# Patient Record
Sex: Male | Born: 1935 | Race: Black or African American | Hispanic: No | Marital: Married | Smoking: Former smoker
Health system: Southern US, Community
[De-identification: ages and names within clinical notes are randomized; demographics above are authoritative.]

## PROBLEM LIST (undated history)

## (undated) DIAGNOSIS — E039 Hypothyroidism, unspecified: Secondary | ICD-10-CM

## (undated) DIAGNOSIS — C801 Malignant (primary) neoplasm, unspecified: Secondary | ICD-10-CM

## (undated) DIAGNOSIS — K219 Gastro-esophageal reflux disease without esophagitis: Secondary | ICD-10-CM

## (undated) DIAGNOSIS — K317 Polyp of stomach and duodenum: Secondary | ICD-10-CM

## (undated) DIAGNOSIS — E785 Hyperlipidemia, unspecified: Secondary | ICD-10-CM

## (undated) DIAGNOSIS — I10 Essential (primary) hypertension: Secondary | ICD-10-CM

## (undated) DIAGNOSIS — K31819 Angiodysplasia of stomach and duodenum without bleeding: Secondary | ICD-10-CM

## (undated) DIAGNOSIS — E079 Disorder of thyroid, unspecified: Secondary | ICD-10-CM

## (undated) DIAGNOSIS — S31000A Unspecified open wound of lower back and pelvis without penetration into retroperitoneum, initial encounter: Secondary | ICD-10-CM

## (undated) DIAGNOSIS — I5022 Chronic systolic (congestive) heart failure: Secondary | ICD-10-CM

## (undated) DIAGNOSIS — E11319 Type 2 diabetes mellitus with unspecified diabetic retinopathy without macular edema: Secondary | ICD-10-CM

## (undated) DIAGNOSIS — D649 Anemia, unspecified: Secondary | ICD-10-CM

## (undated) DIAGNOSIS — M1990 Unspecified osteoarthritis, unspecified site: Secondary | ICD-10-CM

## (undated) DIAGNOSIS — C61 Malignant neoplasm of prostate: Secondary | ICD-10-CM

## (undated) DIAGNOSIS — I214 Non-ST elevation (NSTEMI) myocardial infarction: Secondary | ICD-10-CM

## (undated) DIAGNOSIS — I739 Peripheral vascular disease, unspecified: Secondary | ICD-10-CM

## (undated) DIAGNOSIS — E1140 Type 2 diabetes mellitus with diabetic neuropathy, unspecified: Secondary | ICD-10-CM

## (undated) DIAGNOSIS — J9600 Acute respiratory failure, unspecified whether with hypoxia or hypercapnia: Secondary | ICD-10-CM

## (undated) DIAGNOSIS — R3915 Urgency of urination: Secondary | ICD-10-CM

## (undated) HISTORY — PX: EYE SURGERY: SHX253

## (undated) HISTORY — PX: CATARACT EXTRACTION, BILATERAL: SHX1313

## (undated) HISTORY — PX: FOOT AMPUTATION: SHX951

## (undated) HISTORY — PX: TONSILLECTOMY: SUR1361

## (undated) HISTORY — PX: COLONOSCOPY W/ POLYPECTOMY: SHX1380

---

## 1998-11-17 ENCOUNTER — Encounter: Payer: Self-pay | Admitting: Emergency Medicine

## 1998-11-17 ENCOUNTER — Inpatient Hospital Stay (HOSPITAL_COMMUNITY): Admission: EM | Admit: 1998-11-17 | Discharge: 1998-11-19 | Payer: Self-pay | Admitting: Emergency Medicine

## 1998-11-24 ENCOUNTER — Encounter: Admission: RE | Admit: 1998-11-24 | Discharge: 1998-11-24 | Payer: Self-pay | Admitting: Family Medicine

## 1998-12-09 ENCOUNTER — Encounter: Payer: Self-pay | Admitting: Internal Medicine

## 1998-12-09 ENCOUNTER — Ambulatory Visit (HOSPITAL_COMMUNITY): Admission: RE | Admit: 1998-12-09 | Discharge: 1998-12-09 | Payer: Self-pay | Admitting: Internal Medicine

## 1999-11-12 ENCOUNTER — Encounter: Payer: Self-pay | Admitting: Emergency Medicine

## 1999-11-12 ENCOUNTER — Emergency Department (HOSPITAL_COMMUNITY): Admission: EM | Admit: 1999-11-12 | Discharge: 1999-11-12 | Payer: Self-pay | Admitting: Emergency Medicine

## 2001-06-10 ENCOUNTER — Emergency Department (HOSPITAL_COMMUNITY): Admission: EM | Admit: 2001-06-10 | Discharge: 2001-06-10 | Payer: Self-pay

## 2001-09-24 ENCOUNTER — Encounter: Payer: Self-pay | Admitting: Emergency Medicine

## 2001-09-24 ENCOUNTER — Emergency Department (HOSPITAL_COMMUNITY): Admission: EM | Admit: 2001-09-24 | Discharge: 2001-09-24 | Payer: Self-pay | Admitting: Emergency Medicine

## 2002-03-17 ENCOUNTER — Emergency Department (HOSPITAL_COMMUNITY): Admission: EM | Admit: 2002-03-17 | Discharge: 2002-03-17 | Payer: Self-pay

## 2002-10-27 ENCOUNTER — Encounter: Payer: Self-pay | Admitting: Emergency Medicine

## 2002-10-27 ENCOUNTER — Emergency Department (HOSPITAL_COMMUNITY): Admission: EM | Admit: 2002-10-27 | Discharge: 2002-10-27 | Payer: Self-pay | Admitting: Emergency Medicine

## 2003-05-09 ENCOUNTER — Emergency Department (HOSPITAL_COMMUNITY): Admission: EM | Admit: 2003-05-09 | Discharge: 2003-05-09 | Payer: Self-pay | Admitting: Emergency Medicine

## 2003-06-06 ENCOUNTER — Encounter: Payer: Self-pay | Admitting: Emergency Medicine

## 2003-06-06 ENCOUNTER — Emergency Department (HOSPITAL_COMMUNITY): Admission: EM | Admit: 2003-06-06 | Discharge: 2003-06-06 | Payer: Self-pay | Admitting: Emergency Medicine

## 2004-04-21 ENCOUNTER — Encounter: Admission: RE | Admit: 2004-04-21 | Discharge: 2004-04-21 | Payer: Self-pay | Admitting: Urology

## 2005-11-21 ENCOUNTER — Emergency Department (HOSPITAL_COMMUNITY): Admission: EM | Admit: 2005-11-21 | Discharge: 2005-11-21 | Payer: Self-pay | Admitting: Emergency Medicine

## 2005-11-30 ENCOUNTER — Emergency Department (HOSPITAL_COMMUNITY): Admission: EM | Admit: 2005-11-30 | Discharge: 2005-11-30 | Payer: Self-pay | Admitting: Emergency Medicine

## 2007-01-08 ENCOUNTER — Emergency Department (HOSPITAL_COMMUNITY): Admission: EM | Admit: 2007-01-08 | Discharge: 2007-01-08 | Payer: Self-pay | Admitting: Emergency Medicine

## 2008-07-11 ENCOUNTER — Emergency Department (HOSPITAL_COMMUNITY): Admission: EM | Admit: 2008-07-11 | Discharge: 2008-07-11 | Payer: Self-pay | Admitting: Emergency Medicine

## 2008-07-17 ENCOUNTER — Encounter (INDEPENDENT_AMBULATORY_CARE_PROVIDER_SITE_OTHER): Payer: Self-pay | Admitting: Endocrinology

## 2008-07-17 ENCOUNTER — Inpatient Hospital Stay (HOSPITAL_COMMUNITY): Admission: AD | Admit: 2008-07-17 | Discharge: 2008-07-22 | Payer: Self-pay | Admitting: Endocrinology

## 2008-07-19 ENCOUNTER — Ambulatory Visit: Payer: Self-pay | Admitting: Gastroenterology

## 2008-07-20 ENCOUNTER — Encounter: Payer: Self-pay | Admitting: Gastroenterology

## 2008-07-23 ENCOUNTER — Encounter: Payer: Self-pay | Admitting: Gastroenterology

## 2008-07-24 ENCOUNTER — Encounter (INDEPENDENT_AMBULATORY_CARE_PROVIDER_SITE_OTHER): Payer: Self-pay

## 2008-08-12 ENCOUNTER — Inpatient Hospital Stay (HOSPITAL_COMMUNITY): Admission: EM | Admit: 2008-08-12 | Discharge: 2008-08-14 | Payer: Self-pay | Admitting: Emergency Medicine

## 2008-09-21 ENCOUNTER — Emergency Department (HOSPITAL_COMMUNITY): Admission: EM | Admit: 2008-09-21 | Discharge: 2008-09-21 | Payer: Self-pay | Admitting: Emergency Medicine

## 2008-09-21 ENCOUNTER — Emergency Department (HOSPITAL_COMMUNITY): Admission: EM | Admit: 2008-09-21 | Discharge: 2008-09-22 | Payer: Self-pay | Admitting: Emergency Medicine

## 2010-05-10 ENCOUNTER — Encounter: Payer: Self-pay | Admitting: Gastroenterology

## 2010-05-19 ENCOUNTER — Encounter (INDEPENDENT_AMBULATORY_CARE_PROVIDER_SITE_OTHER): Payer: Self-pay | Admitting: *Deleted

## 2010-06-14 ENCOUNTER — Emergency Department (HOSPITAL_COMMUNITY): Admission: EM | Admit: 2010-06-14 | Discharge: 2010-06-14 | Payer: Self-pay | Admitting: Emergency Medicine

## 2010-10-04 NOTE — Procedures (Signed)
Summary: Recall Assessment/[facility]  Recall Assessment/[facility]   Imported By: Sherian Rein 05/23/2010 07:29:06  _____________________________________________________________________  External Attachment:    Type:   Image     Comment:   External Document

## 2010-10-04 NOTE — Letter (Signed)
Summary: Endoscopy Letter  [facility]  [address] [place], [place] [address]   Phone: [phone]  Fax: [phone]      May 19, 2010 MRN: [id number]   [name] [address] [place], [place]  [address]   Dear [name],   According to your medical record, it is time for you to schedule an Endoscopy. Endoscopic screening is recommended for patients with certain upper digestive tract conditions because of associated increased risk for cancers of the upper digestive system.  This letter has been generated based on the recommendations made at the time of your prior procedure. If you feel that in your particular situation this may no longer apply, please contact our office.  Please call our office at [phone]) to schedule this appointment or to update your records at your earliest convenience.  Thank you for cooperating with [place] to provide you with the very best care possible.   Sincerely,  Judie Petit T. Russella Dar, M.D.  [facility] [phone]

## 2010-11-16 LAB — POCT I-STAT, CHEM 8
BUN: 20 mg/dL (ref 6–23)
Glucose, Bld: 129 mg/dL — ABNORMAL HIGH (ref 70–99)
Potassium: 3.8 mEq/L (ref 3.5–5.1)

## 2010-11-16 LAB — URINALYSIS, ROUTINE W REFLEX MICROSCOPIC
Ketones, ur: NEGATIVE mg/dL
Nitrite: NEGATIVE
Protein, ur: NEGATIVE mg/dL
pH: 6 (ref 5.0–8.0)

## 2010-11-16 LAB — GLUCOSE, CAPILLARY
Glucose-Capillary: 199 mg/dL — ABNORMAL HIGH (ref 70–99)
Glucose-Capillary: 203 mg/dL — ABNORMAL HIGH (ref 70–99)

## 2010-12-19 LAB — WOUND CULTURE

## 2011-01-17 NOTE — H&P (Signed)
NAMEKORBEN, CARCIONE               ACCOUNT NO.:  [id number]   MEDICAL RECORD NO.:  [id number]          PATIENT TYPE:  INP   LOCATION:  1443                         FACILITY:  [facility]   PHYSICIAN:  Tera Mater. Evlyn Kanner, M.D. DATE OF BIRTH:  1928-10-04   DATE OF ADMISSION:  08/12/2008  DATE OF DISCHARGE:                              HISTORY & PHYSICAL   Mr. Evett is a 77 year old white male who presents to the hospital for  evaluation.  I just had him in November 13 to July 20, 2008, at  which time he was found to have a gastric polyps, anasarca due to under  replaced hypothyroidism, and had a positive finding of Helicobacter  pylori.  He was started on the Prevpac in the last 10 days or so, and he  was feeling poorly due to the medications.  He has had postprandial  vomiting for at least 3 or 4 days in the week.  He was progressively  weak today and developed diarrhea yesterday.  He has been cold with some  chills but no measured fever.  His abdomen had been cramping a bit  earlier but is not hurting at the present.  He has had no chest pain or  shortness of breath.  He did check his insulin this morning but had not  taken any since this morning.  He presents to emergency room with a  blood sugar above the level of the meter and found to have blood sugar  of 831.  P.o. intake as noted has been poor.  He has lost some weight.  His edema has not recurred.  He is having no headaches or dizziness.  His muscles are sore, and he has no real arthralgias.  He has no other  complaints at the present time.   His medications include:  1. Lasix 40.  2. Levothyroxine 200.  3. Potassium 20 mEq.  4. Folic acid 1 mg.  5. Simvastatin 20.  6. Zantac 150.  7. 70/30 insulin 60 in the morning and 13 the evening.  8. Aspirin 325.  9. Prevpac as noted.   PAST MEDICAL HISTORY:  He has had type 1 diabetes since 1997.  He has  retinopathy and neuropathy.  He has peripheral vascular disease.  He has  hypothyroidism.  He had anasarca in 2000.  He has had a Gleason 6  prostate cancer.  He has had cataract repair.  He had normal Cardiolite  in 2001 and a normal echocardiogram in 2009.  He had a gastric polyp  removed in 2009.   SOCIAL HISTORY:  Reveals he is retired as a [employer].  He has several children.   FAMILY HISTORY:  His mom died with rheumatoid arthritis and congestive  heart failure.   PHYSICAL EXAMINATION:  Here in the emergency room tonight, blood  pressure 112/48, pulses 98 - had sinus tachycardia, respirations are 18  with no Kussmaul pattern, temperature is 97.7, O2 saturation 96%.  We  have a relatively well-appearing black male sitting up in no distress,  easily recognizing me as I enter the room.  Sclerae anicteric.  Face is symmetric.  Extraocular movements are intact  with no nystagmus.  Fundi reveal retinopathy changes.  Oral mucous  membranes are moist.  No pharyngeal lesions are present.  Dentition is  in poor repair but no abscesses noted.  Tympanic membranes are clear  bilaterally.  Nasal membranes are clear.  NECK:  Is supple without JVD or bruits.  No thyromegaly could be felt.  LUNGS:  Clear without wheezes, rales or rhonchi.  No Kussmaul pattern or  accessory muscles are in use.  HEART:  Is tachycardic and regular with a systolic murmur.  ABDOMEN:  Is mildly distended and diffusely very minimally tender with  hypoactive bowel sounds.  No rebound or pulsations are noted.  EXTREMITIES:  Reveal slightly diminished pulses with no real edema  today.  Nail bed perfusion is fairly good.  The patient is awake, alert.  His mentation is good.  Speech is clear.  He has no resting tremor.  Grip is equal bilaterally.   LABORATORY DATA:  Sodium is 125, potassium 5.9, chloride 87, CO2 14, BUN  27, creatinine 2.4, glucose of 831.  His creatinine at this last  discharge was 1.64.  His lipase is 21.  His AST is 34, ALT is 15,  albumin is 3.6, calcium  9.3.  His white count is 14,200, hemoglobin  10.6, platelets 389,000.     In summary, we have a 77 year old black male with known type 1 diabetes  now presenting with significant diabetic ketoacidosis with moderate  fluid deficits and extreme hyperglycemia.  At the present time, he is  tolerating this better than one would expect and has already been  getting some fluids.  We are getting Glucomander started on him.  He  will get fluid resuscitation and will not get any supplemental potassium  yet.  He does not require any bicarb added.  He will get IV Protonix and  be n.p.o. otherwise.  Hopefully, we can in 12-24 hours get his blood  sugars down and get him back on a regimen.  We will have to then decide  what to do about his H. pylori since he does seem to have some toxicity  due to this treatment.  At the present time, he also has anemia, and  with hydration, this may drop further.  It is macrocytic, and B12 was  good in the last hospitalization.  In addition, of note, his PSA was up  the last hospitalization, and that was to be followed up as an  outpatient but has not happened yet.  There is no evidence of an overt  infectious cause to this exacerbation.  The patient will be placed on  telemetry with inpatient status.           ______________________________  Tera Mater Evlyn Kanner, M.D.     SAS/MEDQ  D:  08/12/2008  T:  08/13/2008  Job:  [id number]

## 2011-01-17 NOTE — Discharge Summary (Signed)
[name], [name]               ACCOUNT NO.:  [id number]   MEDICAL RECORD NO.:  [id number]          PATIENT TYPE:  INP   LOCATION:  1443                         FACILITY:  [facility]   PHYSICIAN:  Tera Mater. Evlyn Kanner, M.D. DATE OF BIRTH:  August 31, 1929   DATE OF ADMISSION:  07/17/2008  DATE OF DISCHARGE:  07/22/2008                               DISCHARGE SUMMARY   DISCHARGE DIAGNOSES:  1. Diffuse anasarca, probably secondary to thyroid disease.  2. Normal cardiac workup.  3. Large gastric polyp now removed.  4. Abnormal PSA in the setting of prostate cancer with planned      outpatient followup.  5. Type 1 diabetes with reasonable control.  6. Anemia without symptoms.  7. Diabetic nephropathy with stable creatinine.  8. Peripheral vascular disease.  9. Under-replaced hypothyroidism, perhaps leading to #1.  10.Constipation.   CONSULTATIONS:  Dr. Viann Fish and Dr. Russella Dar.   PROCEDURES:  Included an echocardiogram and endoscopy.   [name] is a 77 year old black male longstanding patient of my  practice who presented on July 17, 2008.  He had diffuse swelling,  shortness of breath, and the clinical picture looked to be anasarca with  probable new onset congestive heart failure.  He is admitted with IV  Lasix to telemetry and MI was ruled out.  Echocardiogram was done with a  cardiac consult.  Fortunately, all the cardiac workup looked very good  and there was no evidence of any significant cardiac dysfunction.  We  then turned our attention to other causes of anasarca and found he was  significantly hypothyroid which may have been a contributing cause.  There was also concern about anorexia and anemia, and a GI workup was  undertaken after a CAT scan showed what appeared to be a possible mass  in the duodenal area.  He subsequently had a benign gastric polyp  removed.  The patient has done progressively better during this  hospitalization.  His p.o. intake has going from very  poor to moderately  good.  His ambulatory status is improved.  His peripheral edema is much  better.  He has had some trouble with low sugars earlier in the  hospitalization.  He is doing better this morning.  At the present time,  we are going to continue his treatment as an outpatient.  He did have an  elevation of his PSA but only doubled in 4 years, so it is not felt to  represent significant recurrence of disease.  I did a brief phone  discussion with Dr. Annabell Howells who is going to be seeing him as an outpatient  to look at this.  Overall we still have unresolved issues of correcting  his anemia and continuing to correct his peripheral edema, which is  markedly better.  At the present time, he is doing well this morning,  slept without orthopnea.  His fasting blood sugar is improved at 131.  He has not had any lows in the last day.  His blood pressure last was  101/58, pulse 74, respirations 28, O2 sats 94%.   LAB DATA:  A CT  of the abdomen and pelvis, there was bilateral  atelectasis, no pleural or pericardial effusions.  There was a polypoid  mass in the duodenum.  Volume loss lung bases and an 8-mm calcified  renal lesion of the right kidney.  Mild prominence of the prostate  gland, moderate stool throughout the colon, and advanced degenerative  changes of left hip were noted.  Echocardiogram here showed left  ventricular size was normal, left regular systolic function was normal  with an EF of 65%.  No wall motion abnormalities, no significant  valvular heart disease was noted.  No pericardial effusion was noted.  There was mild to moderate dilation of the left atrium and the right  atrium as well.  His endoscopy showed angiodysplasia of the stomach and  duodenum and this polyp that was removed.  The pathology has returned  and shows hyperplastic polyp with focal low grade dysplasia, and  extensive intestinal metaplasia.  No evidence of high-grade dysplasia or  malignancy identified.   There were rod-shaped bacteria consistent with  H.  Pylori gastritis.   OTHER LABS:  This morning a sodium was 135, potassium 3.8, chloride 93,  CO2 35, BUN 32, creatinine 1.64, glucose 178.  His calcium was 8.5.  This morning his white count is 10,800, hemoglobin 9, platelets 288,000,  MCV is 102.3.  PSA was 11.98.  A B12 level was borderline sufficient at  213 and we may have a trial of treatment for that.  A 24-hour urinary  protein was only 37 mg per day.  A TSH was my elevated at 12.546.  A  troponin was 0.01.  Initial white count was 70, hemoglobin 10.4,  platelets 301,000.  His BNP was less than 30.  His initial chemistries,  sodium 134, potassium 3.6, chloride 100, CO2 31, BUN 14, creatinine  1.22, glucose of 41, calcium of 8.6, SGOT was mildly elevated at 49, ALT  was a normal at 7.4, albumin was fine 3.6, total protein 7.4.   SUMMARY:  We have a somewhat complex situation with a fellow of  presenting with anasarca which initially we thought could be cardiac but  turned out probably to be thyroid related.  He additionally he had  significant anemia, a gastric polyp that turned out to be benign, and a  normal cardiac workup.  The present time he is clinically improved.   MEDICATIONS AT DISCHARGE:  1. Furosemide 40 mg more each morning.  2. He also is going to be on a new dose of levothyroxine 200 mcg      daily.  3. Potassium 20 mEq daily.  4. Folic acid 1 mg daily.  5. Hydrochlorothiazide will be stopped.  6. Simvastatin 20 will be continued.  7. Ranitidine 150 twice daily will be continued.  8. He will stop his naproxen.  9. His insulin needs have been less here, and we are going to reduce      his doses to 16 in the morning and 12 in the evening of his 70/30.   DIET:  He is to be and no concentrated sweets diet.   FOLLOWUP:  He is to see me in a week to 2 weeks.  Will have repeat labs  at that time.  He has followup with Dr. Bjorn Pippin, and we will let Dr.  Russella Dar  decide on the appropriate treatment for his H pylori.           ______________________________  Tera Mater. Evlyn Kanner, M.D.     SAS/MEDQ  D:  07/22/2008  T:  07/22/2008  Job:  [id number]

## 2011-01-17 NOTE — Discharge Summary (Signed)
[name], [name]               ACCOUNT NO.:  [id number]   MEDICAL RECORD NO.:  [id number]          PATIENT TYPE:  INP   LOCATION:  1443                         [facility]ACILITY:  [facility]   PHYSICIAN:  Tera Mater. Evlyn Kanner, M.D. DATE OF BIRTH:  July 25, 1929   DATE OF ADMISSION:  08/12/2008  DATE OF DISCHARGE:  08/14/2008                               DISCHARGE SUMMARY   DISCHARGE DIAGNOSES:  1. Moderately severe diabetic ketoacidosis, resolved.  2. Nausea and vomiting probably due to Prevpac plus diabetic      ketoacidosis.  3. Known type 1 diabetes.  4. Recent Helicobacter pylori.  5. Hypothyroidism.  6. Hyperlipidemia.  7. Peripheral vascular disease.  8. Macrocytic anemia.   PROCEDURES:  None.   CONSULTATIONS:  None.   Mr. [name] is a 77 year old black male recently under my care here at the  hospital who presented on the evening of August 12, 2008 with several  days of nausea and vomiting.  This seemed to be coinciding with starting  the Prevpac where he had frequent nausea and vomiting especially  postprandially.  He had poor p.o. intake and basically was not taking  fluids in well either.  When he presented he had extreme hyperglycemia  and moderately severe diabetic ketoacidosis.  Fortunately, this has  resolved fairly quickly.  He got an overnight IV insulin drip and then  was transitioned to injectable insulin.  His p.o. intake was a bit  questionable yesterday, therefore he was held until this morning.  Overall he seems to back to his normal.  His only complaint was the food  is a little tough to chew.  He has no abdominal complaints at the  present time and his laboratory abnormalities have resolved.  His  ketones have cleared in his blood and his volume status appears stable.  I think we can safely manage him as an outpatient.  The unanswered  question is how to manage his H. pylori and we probably will just have  to watch without more therapy for the present time.  His blood  sugar  this morning on his BMET was 226 which is acceptable.   LABS:  Today sodium 135, potassium 3.9, chloride 102, CO2 23, BUN 18,  creatinine 1.09, glucose of 226, calcium of 7.8, white count this  morning was 11,200, hemoglobin 8.8, MCV is 103.4, platelets 314,000.  Serum ketones are negative.  At presentation his sodium was 125,  potassium 5.9, chloride 87, CO2 14, BUN 27, creatinine 2.4, glucose 831,  AST was 34, ALT was 15, albumin 3.6, calcium 9.3, lipase 2.1, white  count was 14,200, hemoglobin 10.6, platelets 389,000.  An EKG with sinus  rhythm with an old right bundle branch block and left anterior  fascicular block and no ischemic changes.  Urine presentation was  positive for 80 mg percent ketones and 1000 mg/dL glucose, fecal blood  was negative.  Serum ketones were large.  We did have a blood gas but I  believe it clotted and I do not see the results here.  Radiology testing  was not needed during this hospitalization.  SUMMARY:  We have a 77 year old black male presenting with moderately  severe diabetic ketoacidosis which resolved relatively quickly.  Unresolved issues are the further treatment of his H. pylori and the  issue of the macrocytic anemia.  He had a B12 level just on the 19th of  November which was borderline at 213 so I am going to try some  outpatient therapy with B12 and folate to see if we can not bring this  up.  His anemia is not symptomatic at the present and he does not  require transfusion.  He will have follow-up with GI.  He will see me in  2 weeks.  He is to have a no concentrated sweets diet.  He is to check  his sugars as before.   MEDICATIONS AT DISCHARGE:  1. He will hold his Lasix for an additional 2 days and then resume at.  2. His levothyroxine is 200 mcg daily.  3. He will hold his potassium while he is holding his Lasix.  4. He is on simvastatin 20.  5. Ranitidine 150.  6. Can resume his insulin 70/30 at 16 in the morning and 12 in  the      evening.   He does not need any pain management.           ______________________________  Tera Mater. Evlyn Kanner, M.D.     SAS/MEDQ  D:  08/14/2008  T:  08/14/2008  Job:  [id number]

## 2011-01-17 NOTE — Consult Note (Signed)
[name], [name] NO.:  [id number]   MEDICAL RECORD NO.:  [id number]          PATIENT TYPE:  INP   LOCATION:  1443                         [facility]ACILITY:  [facility]   PHYSICIAN:  Georga Hacking, M.D.DATE OF BIRTH:  1929-04-20   DATE OF CONSULTATION:  07/17/2008  DATE OF DISCHARGE:                                 CONSULTATION   REASON FOR CONSULTATION:  Shortness of breath and edema.   HISTORY:  This 77 year old black male is seen in the request Dr. Evlyn Kanner  for evaluation of new-onset congestive heart failure.  He has a previous  history of longstanding diabetes mellitus that is insulin dependent.  He  also has significant hypothyroidism.  He has a history of peripheral  vascular disease and a history of Staph bacteremia and prostate cancer  that is treated conservatively.  Diabetes mellitus is complicated by  refractory neuropathy.  He was brought to Dr. Rinaldo Cloud office today with  worsening edema.  He has gained about 15 pounds of weight and has had  cough and upper respiratory congestion.  He has failed to respond to  outpatient treatment with increased Lasix.  He has no previous history  of cardiac disease and denies any history of hypertension.  He had a  history of a Cardiolite test in October 2001.  At that time, he had no  reversible ischemia and had an ejection fraction of 64%.  He has been  using some nonsteroidal anti-inflammatories recently.  He has had poor  p.o. intake, difficulty bending over due to dyspnea, and complaint of  significant ankle swelling.  He was brought into the hospital for  treatment of heart failure.   His past history is remarkable for diabetes mellitus, present for many  years.  He has a history of significant neuropathy and retinopathy.  He  has a history of peripheral vascular disease and hypothyroidism.  He has  a history of prostate cancer that has been treated with watchful  waiting.  According to Dr. Evlyn Kanner that he has  hypertension and previous  cataracts.   PAST SURGICAL HISTORY:  He has had no major surgeries.   CURRENT MEDICATIONS:  Zocor, Naprosyn, Zantac, and folic acid.  He is on  insulin, aspirin, and Synthroid.   FAMILY HISTORY:  Negative for premature cardiac disease.   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  He says that he smoked  cigarettes for several years.  He used to drink, but does not drink  significantly at this time and says he does not smoke.  He has been  married for 51 years.   REVIEW OF SYSTEMS:  Somewhat difficult.  He has significant malaise and  fatigue.  He has reduced vision and does have retinopathy.  He has no  difficulty hearing.  He does have some mild difficulty swallowing.  No  history of ulcers, GI bleeding, diarrhea, or hematochezia.  He says he  has occasional mild nocturia.  He has had significant edema and also has  significant peripheral neuropathy.  Other than as noted above, the  remainder of review of systems is unremarkable.   PHYSICAL EXAMINATION:  GENERAL:  He is a pleasant black male with a  gravelly low deep voice.  VITAL SIGNS:  Blood pressure is 144/83.  Oxygen saturation is 99%.  His  weight is 82.19 kg.  SKIN:  Warm and dry.  ENT:  EOMI.  PERRLA.  CNS Clear.  Funduscopic exam is not performed.  Pharynx is negative.  NECK:  Supple without masses, JVD, thyromegaly, or bruits.  LUNGS:  Basilar crackles noted.  CARDIAC:  Normal S1 and S2.  There is no murmur or S3.  ABDOMEN:  Somewhat firm.  He has 2 to 3+ peripheral edema.  Peripheral  pulses are diminished and vague femoral bruits noted.  NEUROLOGIC:  Diminished sensation in feet.   EKG shows a right bundle-branch block and left axis deviation.   LABORATORY DATA:  Hemoglobin of 10.8, hematocrit 31.2, and MCV of 102.4.  Sodium is 134, potassium 3.6, glucose was only 41 on admission, BUN is  14, and creatinine is  1.22.  CPK total was 1153.   IMPRESSION:  1. History of clinical heart failure with significant peripheral      edema.  2. Longstanding diabetes mellitus with retinopathy and neuropathy.  3. Macrocytic anemia.  4. History of prostate cancer, treated with watchful waiting.  5. History of hypothyroidism.  6. Hypertension.   RECOMMENDATIONS:  The patient at this time does not appear to be in any  acute distress, but has significant cough as well as congestion and  significant edema bilaterally.  He has been treated with intravenous  Lasix.  I would add Lovenox for DVT prophylaxis.  I will review the  results of the echocardiogram done earlier today and would recommend  diuresis at the present time, check urinalysis for proteinuria, and also  a 24-hour urine for protein.  Further recommendations will follow  pending review of the echocardiogram, but likely we will need to have an  ACE inhibitor added and when his fluid retention gets better, maybe a  beta-blocker.      Georga Hacking, M.D.  Electronically Signed     WST/MEDQ  D:  07/17/2008  T:  07/18/2008  Job:  [id number]   cc:   Jeannett Senior A. Evlyn Kanner, M.D.

## 2011-04-20 ENCOUNTER — Emergency Department (HOSPITAL_COMMUNITY): Payer: Medicare Other

## 2011-04-20 ENCOUNTER — Inpatient Hospital Stay (HOSPITAL_COMMUNITY)
Admission: EM | Admit: 2011-04-20 | Discharge: 2011-04-25 | DRG: 638 | Disposition: A | Discharge status: Home Health | Payer: Medicare Other | Attending: Internal Medicine | Admitting: Internal Medicine

## 2011-04-20 DIAGNOSIS — Z9119 Patient's noncompliance with other medical treatment and regimen: Secondary | ICD-10-CM

## 2011-04-20 DIAGNOSIS — K219 Gastro-esophageal reflux disease without esophagitis: Secondary | ICD-10-CM | POA: Diagnosis present

## 2011-04-20 DIAGNOSIS — F1010 Alcohol abuse, uncomplicated: Secondary | ICD-10-CM | POA: Diagnosis present

## 2011-04-20 DIAGNOSIS — Z91199 Patient's noncompliance with other medical treatment and regimen due to unspecified reason: Secondary | ICD-10-CM

## 2011-04-20 DIAGNOSIS — Z8546 Personal history of malignant neoplasm of prostate: Secondary | ICD-10-CM

## 2011-04-20 DIAGNOSIS — E785 Hyperlipidemia, unspecified: Secondary | ICD-10-CM | POA: Diagnosis present

## 2011-04-20 DIAGNOSIS — E875 Hyperkalemia: Secondary | ICD-10-CM | POA: Diagnosis present

## 2011-04-20 DIAGNOSIS — D649 Anemia, unspecified: Secondary | ICD-10-CM | POA: Diagnosis present

## 2011-04-20 DIAGNOSIS — A088 Other specified intestinal infections: Secondary | ICD-10-CM | POA: Diagnosis present

## 2011-04-20 DIAGNOSIS — D72829 Elevated white blood cell count, unspecified: Secondary | ICD-10-CM | POA: Diagnosis present

## 2011-04-20 DIAGNOSIS — R748 Abnormal levels of other serum enzymes: Secondary | ICD-10-CM | POA: Diagnosis present

## 2011-04-20 DIAGNOSIS — D638 Anemia in other chronic diseases classified elsewhere: Secondary | ICD-10-CM | POA: Diagnosis present

## 2011-04-20 DIAGNOSIS — N179 Acute kidney failure, unspecified: Secondary | ICD-10-CM | POA: Diagnosis present

## 2011-04-20 DIAGNOSIS — E039 Hypothyroidism, unspecified: Secondary | ICD-10-CM | POA: Diagnosis present

## 2011-04-20 DIAGNOSIS — E1310 Other specified diabetes mellitus with ketoacidosis without coma: Principal | ICD-10-CM | POA: Diagnosis present

## 2011-04-20 LAB — CARDIAC PANEL(CRET KIN+CKTOT+MB+TROPI)
CK, MB: 9.7 ng/mL (ref 0.3–4.0)
Total CK: 184 U/L (ref 7–232)

## 2011-04-20 LAB — URINALYSIS, ROUTINE W REFLEX MICROSCOPIC
Glucose, UA: >1000 mg/dL — AB
Ketones, ur: >80 mg/dL — AB
Leukocytes, UA: NEGATIVE
Protein, ur: 30 mg/dL — AB
Urobilinogen, UA: 0.2 mg/dL (ref 0.0–1.0)

## 2011-04-20 LAB — COMPREHENSIVE METABOLIC PANEL
BUN: 32 mg/dL — ABNORMAL HIGH (ref 6–23)
Calcium: 9.7 mg/dL (ref 8.4–10.5)
GFR calc Af Amer: 37 mL/min — ABNORMAL LOW (ref >60)
Glucose, Bld: 477 mg/dL — ABNORMAL HIGH (ref 70–99)
Total Protein: 7.4 g/dL (ref 6.0–8.3)

## 2011-04-20 LAB — BLOOD GAS, ARTERIAL
Acid-base deficit: 22.6 mmol/L — ABNORMAL HIGH (ref 0.0–2.0)
Bicarbonate: 4.8 mEq/L — ABNORMAL LOW (ref 20.0–24.0)
pCO2 arterial: 13.4 mmHg — CL (ref 35.0–45.0)
pH, Arterial: 7.176 — CL (ref 7.350–7.450)

## 2011-04-20 LAB — POCT I-STAT, CHEM 8
BUN: 32 mg/dL — ABNORMAL HIGH (ref 6–23)
Chloride: 100 mEq/L (ref 96–112)
Creatinine, Ser: 2 mg/dL — ABNORMAL HIGH (ref 0.50–1.35)
Sodium: 128 mEq/L — ABNORMAL LOW (ref 135–145)

## 2011-04-20 LAB — URINE MICROSCOPIC-ADD ON

## 2011-04-20 LAB — GLUCOSE, CAPILLARY
Glucose-Capillary: >600 mg/dL (ref 70–99)
Glucose-Capillary: >600 mg/dL (ref 70–99)
Glucose-Capillary: >600 mg/dL (ref 70–99)

## 2011-04-20 LAB — CBC
HCT: 35.5 % — ABNORMAL LOW (ref 39.0–52.0)
Hemoglobin: 10.9 g/dL — ABNORMAL LOW (ref 13.0–17.0)
MCHC: 30.7 g/dL (ref 30.0–36.0)
Platelets: 289 10*3/uL (ref 150–400)

## 2011-04-20 LAB — POCT I-STAT TROPONIN I: Troponin i, poc: 0 ng/mL (ref 0.00–0.08)

## 2011-04-20 LAB — DIFFERENTIAL
Basophils Relative: 0 % (ref 0–1)
Eosinophils Relative: 0 % (ref 0–5)
Lymphs Abs: 1.4 10*3/uL (ref 0.7–4.0)
Monocytes Absolute: 0.5 10*3/uL (ref 0.1–1.0)
Neutro Abs: 11.1 10*3/uL — ABNORMAL HIGH (ref 1.7–7.7)

## 2011-04-20 LAB — LIPID PANEL
LDL Cholesterol: 236 mg/dL — ABNORMAL HIGH (ref 0–99)
VLDL: 24 mg/dL (ref 0–40)

## 2011-04-20 LAB — MAGNESIUM: Magnesium: 2.3 mg/dL (ref 1.5–2.5)

## 2011-04-20 LAB — PHOSPHORUS: Phosphorus: 4.1 mg/dL (ref 2.3–4.6)

## 2011-04-21 DIAGNOSIS — I369 Nonrheumatic tricuspid valve disorder, unspecified: Secondary | ICD-10-CM

## 2011-04-21 LAB — GLUCOSE, CAPILLARY
Glucose-Capillary: 429 mg/dL — ABNORMAL HIGH (ref 70–99)
Glucose-Capillary: 249 mg/dL — ABNORMAL HIGH (ref 70–99)
Glucose-Capillary: 240 mg/dL — ABNORMAL HIGH (ref 70–99)
Glucose-Capillary: 214 mg/dL — ABNORMAL HIGH (ref 70–99)
Glucose-Capillary: 172 mg/dL — ABNORMAL HIGH (ref 70–99)
Glucose-Capillary: 118 mg/dL — ABNORMAL HIGH (ref 70–99)
Glucose-Capillary: 101 mg/dL — ABNORMAL HIGH (ref 70–99)
Glucose-Capillary: 83 mg/dL (ref 70–99)
Glucose-Capillary: 254 mg/dL — ABNORMAL HIGH (ref 70–99)

## 2011-04-21 LAB — CBC
HCT: 29.1 % — ABNORMAL LOW (ref 39.0–52.0)
MCV: 97.3 fL (ref 78.0–100.0)
RDW: 13.8 % (ref 11.5–15.5)
WBC: 11.2 10*3/uL — ABNORMAL HIGH (ref 4.0–10.5)

## 2011-04-21 LAB — BASIC METABOLIC PANEL
BUN: 28 mg/dL — ABNORMAL HIGH (ref 6–23)
BUN: 26 mg/dL — ABNORMAL HIGH (ref 6–23)
BUN: 25 mg/dL — ABNORMAL HIGH (ref 6–23)
CO2: 20 mEq/L (ref 19–32)
CO2: 24 mEq/L (ref 19–32)
CO2: 22 mEq/L (ref 19–32)
Calcium: 9.1 mg/dL (ref 8.4–10.5)
Calcium: 9 mg/dL (ref 8.4–10.5)
Calcium: 9.4 mg/dL (ref 8.4–10.5)
Calcium: 9.1 mg/dL (ref 8.4–10.5)
Chloride: 102 mEq/L (ref 96–112)
Chloride: 103 mEq/L (ref 96–112)
Chloride: 106 mEq/L (ref 96–112)
Chloride: 97 mEq/L (ref 96–112)
Creatinine, Ser: 1.47 mg/dL — ABNORMAL HIGH (ref 0.50–1.35)
Creatinine, Ser: 1.48 mg/dL — ABNORMAL HIGH (ref 0.50–1.35)
Creatinine, Ser: 1.5 mg/dL — ABNORMAL HIGH (ref 0.50–1.35)
Creatinine, Ser: 1.59 mg/dL — ABNORMAL HIGH (ref 0.50–1.35)
Creatinine, Ser: 2.02 mg/dL — ABNORMAL HIGH (ref 0.50–1.35)
GFR calc Af Amer: 39 mL/min — ABNORMAL LOW (ref >60)
GFR calc Af Amer: 51 mL/min — ABNORMAL LOW (ref >60)
GFR calc Af Amer: 55 mL/min — ABNORMAL LOW (ref >60)
GFR calc non Af Amer: 42 mL/min — ABNORMAL LOW (ref >60)
GFR calc non Af Amer: 46 mL/min — ABNORMAL LOW (ref >60)
GFR calc non Af Amer: 45 mL/min — ABNORMAL LOW (ref >60)
Glucose, Bld: 228 mg/dL — ABNORMAL HIGH (ref 70–99)
Glucose, Bld: 213 mg/dL — ABNORMAL HIGH (ref 70–99)
Glucose, Bld: 266 mg/dL — ABNORMAL HIGH (ref 70–99)
Potassium: 3.2 mEq/L — ABNORMAL LOW (ref 3.5–5.1)
Potassium: 3.9 mEq/L (ref 3.5–5.1)
Sodium: 136 mEq/L (ref 135–145)
Sodium: 136 mEq/L (ref 135–145)

## 2011-04-21 LAB — CARDIAC PANEL(CRET KIN+CKTOT+MB+TROPI)
CK, MB: 10.7 ng/mL (ref 0.3–4.0)
Relative Index: 5 — ABNORMAL HIGH (ref 0.0–2.5)
Relative Index: 4.2 — ABNORMAL HIGH (ref 0.0–2.5)
Relative Index: 4 — ABNORMAL HIGH (ref 0.0–2.5)
Total CK: 273 U/L — ABNORMAL HIGH (ref 7–232)
Total CK: 384 U/L — ABNORMAL HIGH (ref 7–232)
Troponin I: 0.42 ng/mL (ref <0.30)
Troponin I: 0.41 ng/mL (ref <0.30)

## 2011-04-21 LAB — IRON AND TIBC: UIBC: 206 ug/dL

## 2011-04-21 LAB — HEMOGLOBIN A1C
Hgb A1c MFr Bld: 9.5 % — ABNORMAL HIGH (ref <5.7)
Mean Plasma Glucose: 226 mg/dL — ABNORMAL HIGH (ref <117)

## 2011-04-21 LAB — RAPID URINE DRUG SCREEN, HOSP PERFORMED
Benzodiazepines: NOT DETECTED
Cocaine: NOT DETECTED

## 2011-04-21 LAB — FOLATE: Folate: >20 ng/mL

## 2011-04-21 LAB — FERRITIN: Ferritin: 162 ng/mL (ref 22–322)

## 2011-04-21 LAB — TSH: TSH: 7.728 u[IU]/mL — ABNORMAL HIGH (ref 0.350–4.500)

## 2011-04-21 NOTE — H&P (Signed)
NAME:  [name], [name] NO.:  [id number]  MEDICAL RECORD NO.:  [id number]  LOCATION:  1224                         FACILITY:  [facility]  PHYSICIAN:  Rosanna Randy, MDDATE OF BIRTH:  01/10/1929  DATE OF ADMISSION:  04/20/2011 DATE OF DISCHARGE:                             HISTORY & PHYSICAL   CHIEF COMPLAINT:  Increased frequency/polyuria, fatigue, generalized weakness, and diarrhea.  HISTORY OF PRESENT ILLNESS:  The patient is an 77 year old male with a past medical history significant for diabetes mellitus type 1 insulin dependent, gastroesophageal reflux disease with a history of H. pylori in the past, hypothyroidism, hyperlipidemia, and a history of prostate cancer, who has been dismissed from the practice of Dr. Laurene Footman and currently cannot receive the medication as left by previously indicated by these doctors, who came into the hospital after having severe episode of diarrhea on Sunday, not associated with any hematochezia or melena. There was no vomiting, but positive nausea, also decreased appetite with a subsequent increased frequency/polyuria, fatigue, and generalized weakness that has been worsening throughout all these days until the date of admission.  When the patient came into the emergency department for further evaluation and treatment, he was found to be on DKA with hyperkalemia with a potassium of 5.4 and creatinine of 2.0, and a BUN of 32.  Last 2, completely new for this patient.  The patient has history of DKA in the past secondary to medication noncompliance.  At this point, [facility] has been called to admit the patient for further evaluation and treatment.  ALLERGIES:  The patient denies any allergic reaction.  PAST MEDICAL HISTORY:  As mentioned above.  Include, 1. Diabetes, insulin dependent. 2. Hyperlipidemia. 3. History of prostate cancer. 4. Hypothyroidism. 5. Anemia with macrocytosis. 6. History of former  tobacco abuse. 7. Also heavy alcohol intake.  MEDICATIONS:  At the moment of this dictation, a med rec has not been performed, but according to the patient, he uses occasional Tylenol for pain and also other insulin doses have been reported by the patient to be 70/30, 16 units in the morning and 8 units at night.  SOCIAL HISTORY:  The patient denies illicit drugs.  He reports being a former smoker, quit 3 years ago and also a heavy drinker, but currently he had just 1 to 2 or 3 beers per week according to him.  The patient lives with his wife in [place], and he pretty much is able to do everything by himself.  He is retired currently and used to be a Naval architect.  REVIEW OF SYSTEMS:  Negative except as mentioned, otherwise on HPI.  FAMILY HISTORY:  Noncontributory.  PHYSICAL EXAM:  VITAL SIGNS:  Temperature 97.6, heart rate 75, respiratory rate 16, blood pressure 113/50, oxygen saturation 100% on room air, again blood pressure 113/50. GENERAL:  The patient was lying in bed, no acute distress, oriented x3. HEENT:  There was no trauma appreciated on his head, normocephalic. Eyes without icterus and no nystagmus.  PERRLA.  Extraocular muscles intact.  There was no discharges appreciated coming out of his ears or his nostrils.  Mucous membranes were dried on evaluation.  There was no erythema,  exudates, or oral thrush appreciated on his mouth.  Poor dentition was appreciated. NECK:  Supple.  No bruits.  No thyromegaly. RESPIRATORY SYSTEM:  Clear to auscultation bilaterally. HEART:  Regular rate and rhythm.  No murmurs appreciated. ABDOMEN:  Soft, nontender, and nondistended.  Positive bowel sounds. EXTREMITIES:  Trace of edema bilaterally.  No cyanosis or clubbing. SKIN:  Mild decreased turgor, but no petechiae or rashes. NEUROLOGIC EXAM:  The patient was alert, awake, and oriented x3.  Muscle strength 4/5 bilaterally symmetrically secondary to poor effort due to his generalized  weakness.  The patient had a cranial nerves  II to XII intact, and is not having any neurologic deficit.  PERTINENT LABORATORY DATA:  Demonstrated a urine microscopy with hyaline casts,  0-2 red blood cells, and a few bacteria.  Urinalysis, clear with specific gravity of 1.024, more than 1000 glucose, more than 80 ketones, 30 protein, nitrite and leukocytes were negative.  CBC with differential showed white blood cells of 13.0, hemoglobin 10.9, MCV 105.3, platelets 289, troponin point-of-care was 0.00.  I-STAT chemistry demonstrated a bicarb of 8 with a sodium of 128, potassium 5.4, chloride 100, glucose more than 700, BUN 32, and creatinine 2.00.  The patient has ionized calcium of 1.18.  ABG demonstrated a pH of 7.176, CO2 of 13.4, bicarb 4.8, and oxygen saturation 97.2.  X-ray demonstrated no acute cardiopulmonary findings.  EKG, right bundle branch block with left fascicular block.  There is also mild left atrium enlargement.  There is no old EKGs for comparison.  ASSESSMENT AND PLAN: 1. Diabetic ketoacidosis.  At this point, we are going to admit the     patient to a step-down, provide fluid resuscitation.  We are going     to start the patient on blood glucose stabilizer, and we are going     to replete/manage his electrolytes accordingly.  The patient is     going to be evaluated to try to figure out all the sources beside     noncompliant with his insulin, ruling out myocardial infarction,     excessive alcohol intake and other intoxication by reviewing a     ultra-Doppler sonography.  We are going also to rule out     infections, checking blood cultures, and x-rays have demonstrated     now, no pneumonia.  We are going to check urine culture.  We will     go ahead and check also Clostridium difficile by polymerase chain     reaction due to the complaints of abdominal discomfort and diarrhea     prior to this derangement in the control of his diabetes. 2. Hyperkalemia, most  likely associated with the whole diabetic     ketoacidosis.  We are expecting that this is going to get corrected     after the glucose stabilizer to control his blood sugar.  We are     going to provide fluid resuscitation and we are going to monitor     his electrolytes.  At this point, he is not in need of Kayexalate.     There is no abnormalities suggesting hyperkalemia, findings of the     electrocardiogram and the level is just 5.4. 3. Acute renal failure is most likely prerenal in etiology due to     dehydration and diabetic ketoacidosis.  We are going to provide     fluid resuscitation.  We are going to follow his blood work     specifically for renal function, and  if he failed to improve with     the fluid resuscitation, we will escalate to have a workup     including a renal ultrasound. 4. Diabetes.  We are going to check hemoglobin A1c, but by this     episode of diabetic ketoacidosis and looking at his history with a     previous diabetic ketoacidosis in the past, we are expecting that     is going to be decompensated.  We are going to provide diabetes     education while he is in the hospital.  We are going to adjust his     insulin doses once he is out of the diabetic ketoacidosis. 5. Hyperlipidemia.  According to the patient, not taking any     medications.  We will go ahead and check a fasting lipid profile     and depending results, start him on some treatment. 6. Anemia with elevated MCV.  We will go ahead and check an anemia     panel.  This abnormality most likely secondary to the history of     alcohol abuse, especially looking at the records, that is elevated     MCV in the past, but at that time, there was no decrease on his     hemoglobin.  We are going to check an anemia panel and we will,     depending results, decide further workup. 7. Leukocytosis.  Most likely associated with the diabetic     ketoacidosis, done a stress.  Not having at this point, fever or      any specific source of infection.  We are going to follow the     cultures that have been ordered and depending results, we would     decide if he require any antibiotic treatment. 8. Hypothyroidism.  We are going to check a thyroid-stimulating     hormone.  The patient, according to him, not taking any medication.     We will start treatment if needed, based on his thyroid-stimulating     hormone level.  An order have been requested for pharmacy to     perform a medication reconciliation form. 9. Diarrhea.  At this point, unclear of the etiology, might be just     viral gastroenteritis, but it could be also a Clostridium difficile     or any other type of infection.  We are going to start checking for     Clostridium difficile by polymerase chain reaction, and if that is     negative and the diarrhea continues, we will go ahead and do a     stool ova and parasite.  This conclude the dictation. 10.For deep venous thrombosis, we are going to use heparin.     Rosanna Randy, MD     CEM/MEDQ  D:  04/20/2011  T:  04/20/2011  Job:  [id number]  Electronically Signed by Vassie Loll MD on 04/21/2011 05:51:59 PM

## 2011-04-22 LAB — GLUCOSE, CAPILLARY
Glucose-Capillary: 283 mg/dL — ABNORMAL HIGH (ref 70–99)
Glucose-Capillary: 213 mg/dL — ABNORMAL HIGH (ref 70–99)
Glucose-Capillary: 218 mg/dL — ABNORMAL HIGH (ref 70–99)

## 2011-04-22 LAB — BASIC METABOLIC PANEL
BUN: 20 mg/dL (ref 6–23)
CO2: 23 mEq/L (ref 19–32)
Calcium: 8.9 mg/dL (ref 8.4–10.5)
Chloride: 100 mEq/L (ref 96–112)
Creatinine, Ser: 1.29 mg/dL (ref 0.50–1.35)
Creatinine, Ser: 1.17 mg/dL (ref 0.50–1.35)
GFR calc Af Amer: >60 mL/min (ref >60)
GFR calc non Af Amer: 60 mL/min — ABNORMAL LOW (ref >60)
Glucose, Bld: 262 mg/dL — ABNORMAL HIGH (ref 70–99)

## 2011-04-22 LAB — CBC
HCT: 29.8 % — ABNORMAL LOW (ref 39.0–52.0)
MCHC: 33.9 g/dL (ref 30.0–36.0)
RDW: 13.8 % (ref 11.5–15.5)

## 2011-04-22 LAB — URINE CULTURE: Culture: NO GROWTH

## 2011-04-22 LAB — CARDIAC PANEL(CRET KIN+CKTOT+MB+TROPI)
CK, MB: 13.3 ng/mL (ref 0.3–4.0)
Relative Index: 3.8 — ABNORMAL HIGH (ref 0.0–2.5)
Total CK: 349 U/L — ABNORMAL HIGH (ref 7–232)

## 2011-04-23 LAB — BASIC METABOLIC PANEL
BUN: 13 mg/dL (ref 6–23)
Calcium: 9.1 mg/dL (ref 8.4–10.5)
Creatinine, Ser: 1.02 mg/dL (ref 0.50–1.35)
GFR calc non Af Amer: >60 mL/min (ref >60)
Glucose, Bld: 90 mg/dL (ref 70–99)
Sodium: 133 mEq/L — ABNORMAL LOW (ref 135–145)

## 2011-04-23 LAB — CBC
Hemoglobin: 9.9 g/dL — ABNORMAL LOW (ref 13.0–17.0)
MCH: 33.2 pg (ref 26.0–34.0)
MCHC: 34.1 g/dL (ref 30.0–36.0)
MCV: 97.3 fL (ref 78.0–100.0)

## 2011-04-23 LAB — GLUCOSE, CAPILLARY
Glucose-Capillary: 164 mg/dL — ABNORMAL HIGH (ref 70–99)
Glucose-Capillary: 265 mg/dL — ABNORMAL HIGH (ref 70–99)
Glucose-Capillary: 153 mg/dL — ABNORMAL HIGH (ref 70–99)

## 2011-04-24 ENCOUNTER — Inpatient Hospital Stay (HOSPITAL_COMMUNITY): Payer: Medicare Other

## 2011-04-24 ENCOUNTER — Ambulatory Visit (HOSPITAL_COMMUNITY): Payer: Medicare Other

## 2011-04-24 LAB — BASIC METABOLIC PANEL
Chloride: 97 mEq/L (ref 96–112)
GFR calc Af Amer: >60 mL/min (ref >60)
GFR calc non Af Amer: >60 mL/min (ref >60)
Potassium: 3.9 mEq/L (ref 3.5–5.1)
Sodium: 131 mEq/L — ABNORMAL LOW (ref 135–145)

## 2011-04-24 LAB — CBC
Platelets: 226 10*3/uL (ref 150–400)
RDW: 13.8 % (ref 11.5–15.5)
WBC: 6.8 10*3/uL (ref 4.0–10.5)

## 2011-04-24 LAB — GLUCOSE, CAPILLARY
Glucose-Capillary: 74 mg/dL (ref 70–99)
Glucose-Capillary: 240 mg/dL — ABNORMAL HIGH (ref 70–99)

## 2011-04-24 MED ORDER — TECHNETIUM TC 99M TETROFOSMIN IV KIT
10.0000 | PACK | Freq: Once | INTRAVENOUS | Status: AC | PRN
  Administered 2011-04-24: 10 via INTRAVENOUS

## 2011-04-24 MED ORDER — TECHNETIUM TC 99M TETROFOSMIN IV KIT
30.0000 | PACK | Freq: Once | INTRAVENOUS | Status: AC | PRN
  Administered 2011-04-24: 30 via INTRAVENOUS

## 2011-04-25 LAB — GLUCOSE, CAPILLARY: Glucose-Capillary: 356 mg/dL — ABNORMAL HIGH (ref 70–99)

## 2011-04-27 NOTE — Discharge Summary (Signed)
NAMEYUNUS, STOKLOSA NO.:  [id number]  MEDICAL RECORD NO.:  [id number]  LOCATION:  1419                         FACILITY:  [facility]  PHYSICIAN:  Kathlen Mody, MD       DATE OF BIRTH:  1929-04-19  DATE OF ADMISSION:  04/20/2011 DATE OF DISCHARGE:  04/25/2011                              DISCHARGE SUMMARY   DISCHARGE DIAGNOSES: 1. Diabetic ketoacidosis, resolved. 2. Hyperkalemia, resolved. 3. Acute renal failure resolved. 4. Type 2 diabetes. 5. Hyperlipidemia. 6. Anemia. 7. Leukocytosis, resolved. 8. Hypothyroidism. 9. Elevated cardiac enzyme. 10.Gastroesophageal reflux disease.  DISCHARGE MEDICATIONS: 1. Guaifenesin 650 mg q.4 h. p.r.n. 2. Simvastatin 20 mg half a tablet daily. 3. Ranitidine 150 mg 1 tablet twice a day. 4. 70/30 NovoLog mix 18 units in the morning and 14 units in the     evening. 5. Ferrous sulfate 325 mg 1 tablet twice a day. 6. Lasix 40 mg daily. 7. Aspirin 81 mg daily. 8. Synthroid 1 tablet daily. 9. Folic acid 1 tablet daily. 10.Thiamine 100 mg 1 tab daily.  CONSULTATIONS:  Cardiovascular consult.  PROCEDURES:  The patient had a Myoview scan which showed small inferior apical scar with left ventricular ejection fraction of 51%.  PERTINENT LABS:  On admission, the patient had a CBC done, which showed a WBC count of 13, hemoglobin of 10.9, hematocrit 35, platelets of 289. Troponin was normal.  I-STAT Chem-8 showed a sodium of 128, potassium of 4.4, hemoglobin of 12.6, creatinine of 2, BUN 32.  Urinalysis was negative for nitrates and leukocytes.  MRSA and PCR negative  Alcohol level less than 11.  Lipid profile showed an LDL of 236 and HDL of 332. TSH was high at 7.728.  Free T4 was low at 2.8.  Urine culture was negative at the day of discharge,  His hemoglobin A1c was 9.5.  Anemia panel showed ferritin of 162.  On the day of discharge, the patient's CBC showed a hemoglobin of 9.7.  Repeat metabolic panel showed a sodium of  131, potassium of 3.9.  DIAGNOSTIC STUDIES:  The patient has a 77 year chest x-ray which showed no acute cardiopulmonary findings.  Myoview scan done on April 24, 2011 shows fixed defect of the inferior wall, inferior wall hypokinesia, left ventricular ejection fraction equal to 51%.  BRIEF ADMISSION HISTORY:  This is an 77 year old gentleman admitted for polyuria, polydipsia, generalized weakness, diarrhea who was found to be in DKA.  He was admitted to step-down and was on DKA protocol.  HOSPITAL COURSE: 1. DKA.  The patient was on insulin GTT later transitioned to Novolin     70/30.  He is at this time on a b.i.d. dosing of insulin aspart     protamine/aspart 70/30 mix 18 units in the morning and 14 units at     night and NovoLog sliding scale and his sugars over the last 24     hours have been less than 250.  He is recommended to follow up with     his PCP who has encouraged to be compliant with his medications. 2. Acute renal failure, most likely secondary to dehydration, prerenal     azotemia.  He  was adequately hydrated and his renal functions     normal. 3. Uncontrolled diabetes, hemoglobin A1c of 9.5.  He was in DKA,     secondary to being noncompliant to the hospital.  He has been     educated while he is in the hospital and his insulin dose has been     adjusted. 4. Elevated CK-MB.  Cardiology consult was called.  Recommended     Myoview scan.  Myoview scan was on April 24, 2011 which showed     fixed defect of the inferior apical wall most likely a scar, left     ventricular systolic function of 51%.  The cardiology has signed     off to be followed as outpatient as needed. 5. Hyperkalemia, secondary to diabetic ketoacidosis, but his potassium     is normal at this time. 6. Hyperlipidemia.  Continue with Nystatin. 7. Anemia most likely secondary to a combination of alcohol abuse.  He     was started on folic acid, thiamine, and also on iron supplements. 8.  Leukocytosis, most likely secondary to being dehydrated/reactive.     It has resolved on adequate hydration. 9. Hypothyroidism.  The patient has history of hypothyroidism, but has     been noncompliant to his medication, was restarted on his Synthroid     of 100 mcg daily. 10.Diarrhea most likely secondary to viral gastroenteritis which has     resolved.  PHYSICAL EXAMINATION:  Afebrile, pulse was 65, respirations 18, blood pressure 130/73, saturating 98% on room air.  His exam was within normal limits.  FOLLOWUP:  He is hemodynamically stable today to be discharged home.  He is recommended to follow up with his PCP regarding diabetes in about 1-2 weeks.          ______________________________ Kathlen Mody, MD     VA/MEDQ  D:  04/25/2011  T:  04/26/2011  Job:  [id number]  Electronically Signed by Kathlen Mody MD on 04/27/2011 03:57:17 PM

## 2011-05-04 ENCOUNTER — Inpatient Hospital Stay (HOSPITAL_COMMUNITY)
Admission: EM | Admit: 2011-05-04 | Discharge: 2011-05-06 | DRG: 638 | Disposition: A | Discharge status: Home Health | Payer: Medicare Other | Attending: Internal Medicine | Admitting: Internal Medicine

## 2011-05-04 DIAGNOSIS — E1310 Other specified diabetes mellitus with ketoacidosis without coma: Principal | ICD-10-CM | POA: Diagnosis present

## 2011-05-04 DIAGNOSIS — E875 Hyperkalemia: Secondary | ICD-10-CM | POA: Diagnosis present

## 2011-05-04 DIAGNOSIS — D539 Nutritional anemia, unspecified: Secondary | ICD-10-CM | POA: Diagnosis present

## 2011-05-04 DIAGNOSIS — E871 Hypo-osmolality and hyponatremia: Secondary | ICD-10-CM | POA: Diagnosis present

## 2011-05-04 DIAGNOSIS — E785 Hyperlipidemia, unspecified: Secondary | ICD-10-CM | POA: Diagnosis present

## 2011-05-04 DIAGNOSIS — E039 Hypothyroidism, unspecified: Secondary | ICD-10-CM | POA: Diagnosis present

## 2011-05-04 DIAGNOSIS — N179 Acute kidney failure, unspecified: Secondary | ICD-10-CM | POA: Diagnosis present

## 2011-05-04 DIAGNOSIS — Z9119 Patient's noncompliance with other medical treatment and regimen: Secondary | ICD-10-CM

## 2011-05-04 DIAGNOSIS — F1010 Alcohol abuse, uncomplicated: Secondary | ICD-10-CM | POA: Diagnosis present

## 2011-05-04 DIAGNOSIS — Z794 Long term (current) use of insulin: Secondary | ICD-10-CM

## 2011-05-04 DIAGNOSIS — Z91199 Patient's noncompliance with other medical treatment and regimen due to unspecified reason: Secondary | ICD-10-CM

## 2011-05-04 DIAGNOSIS — Z87891 Personal history of nicotine dependence: Secondary | ICD-10-CM

## 2011-05-04 LAB — BASIC METABOLIC PANEL
CO2: 14 mEq/L — ABNORMAL LOW (ref 19–32)
Calcium: 8.9 mg/dL (ref 8.4–10.5)
GFR calc non Af Amer: 45 mL/min — ABNORMAL LOW (ref >60)
Potassium: 6.1 mEq/L — ABNORMAL HIGH (ref 3.5–5.1)
Sodium: 127 mEq/L — ABNORMAL LOW (ref 135–145)

## 2011-05-04 LAB — DIFFERENTIAL
Basophils Absolute: 0 10*3/uL (ref 0.0–0.1)
Eosinophils Relative: 1 % (ref 0–5)
Lymphocytes Relative: 12 % (ref 12–46)
Lymphs Abs: 1.5 10*3/uL (ref 0.7–4.0)
Neutro Abs: 10.6 10*3/uL — ABNORMAL HIGH (ref 1.7–7.7)
Neutrophils Relative %: 84 % — ABNORMAL HIGH (ref 43–77)

## 2011-05-04 LAB — URINALYSIS, ROUTINE W REFLEX MICROSCOPIC
Bilirubin Urine: NEGATIVE
Hgb urine dipstick: NEGATIVE
Nitrite: NEGATIVE
Specific Gravity, Urine: 1.027 (ref 1.005–1.030)
Urobilinogen, UA: 0.2 mg/dL (ref 0.0–1.0)
pH: 5 (ref 5.0–8.0)

## 2011-05-04 LAB — CBC
HCT: 29.9 % — ABNORMAL LOW (ref 39.0–52.0)
MCV: 104.2 fL — ABNORMAL HIGH (ref 78.0–100.0)
RBC: 2.87 MIL/uL — ABNORMAL LOW (ref 4.22–5.81)
RDW: 14.9 % (ref 11.5–15.5)
WBC: 12.6 10*3/uL — ABNORMAL HIGH (ref 4.0–10.5)

## 2011-05-04 LAB — GLUCOSE, CAPILLARY
Glucose-Capillary: 492 mg/dL — ABNORMAL HIGH (ref 70–99)
Glucose-Capillary: 458 mg/dL — ABNORMAL HIGH (ref 70–99)

## 2011-05-04 LAB — URINE MICROSCOPIC-ADD ON

## 2011-05-05 LAB — TSH: TSH: 6.181 u[IU]/mL — ABNORMAL HIGH (ref 0.350–4.500)

## 2011-05-05 LAB — BASIC METABOLIC PANEL
BUN: 22 mg/dL (ref 6–23)
Calcium: 8.1 mg/dL — ABNORMAL LOW (ref 8.4–10.5)
Calcium: 8.2 mg/dL — ABNORMAL LOW (ref 8.4–10.5)
Creatinine, Ser: 1.36 mg/dL — ABNORMAL HIGH (ref 0.50–1.35)
GFR calc Af Amer: >60 mL/min (ref >60)
GFR calc Af Amer: >60 mL/min (ref >60)
GFR calc Af Amer: >60 mL/min (ref >60)
GFR calc non Af Amer: 50 mL/min — ABNORMAL LOW (ref >60)
GFR calc non Af Amer: 58 mL/min — ABNORMAL LOW (ref >60)
Potassium: 4.4 mEq/L (ref 3.5–5.1)
Potassium: 3.5 mEq/L (ref 3.5–5.1)
Sodium: 132 mEq/L — ABNORMAL LOW (ref 135–145)
Sodium: 132 mEq/L — ABNORMAL LOW (ref 135–145)
Sodium: 134 mEq/L — ABNORMAL LOW (ref 135–145)

## 2011-05-05 LAB — HEMOGLOBIN A1C: Hgb A1c MFr Bld: 10.4 % — ABNORMAL HIGH (ref <5.7)

## 2011-05-05 LAB — GLUCOSE, CAPILLARY
Glucose-Capillary: 354 mg/dL — ABNORMAL HIGH (ref 70–99)
Glucose-Capillary: 126 mg/dL — ABNORMAL HIGH (ref 70–99)
Glucose-Capillary: 185 mg/dL — ABNORMAL HIGH (ref 70–99)
Glucose-Capillary: 275 mg/dL — ABNORMAL HIGH (ref 70–99)
Glucose-Capillary: 323 mg/dL — ABNORMAL HIGH (ref 70–99)
Glucose-Capillary: 429 mg/dL — ABNORMAL HIGH (ref 70–99)
Glucose-Capillary: 89 mg/dL (ref 70–99)
Glucose-Capillary: 94 mg/dL (ref 70–99)

## 2011-05-05 LAB — CBC
MCHC: 33.3 g/dL (ref 30.0–36.0)
Platelets: 286 10*3/uL (ref 150–400)
RDW: 14.4 % (ref 11.5–15.5)

## 2011-05-05 LAB — MRSA PCR SCREENING: MRSA by PCR: NEGATIVE

## 2011-05-05 NOTE — H&P (Signed)
NAME:  [name], [name] NO.:  [id number]  MEDICAL RECORD NO.:  [id number]  LOCATION:  WLED                         FACILITY:  [facility]  PHYSICIAN:  Erick Blinks, MD     DATE OF BIRTH:  [birth year]  DATE OF ADMISSION:  05/04/2011 DATE OF DISCHARGE:                             HISTORY & PHYSICAL   PRIMARY CARE PHYSICIAN:  At [facility].  CHIEF COMPLAINT:  Generalized weakness and feeling unwell.  HISTORY OF PRESENT ILLNESS:  This is an 77 year old African American gentleman with history of diabetes who was recently hospitalized for diabetic ketoacidosis and was discharged approximately 10 days ago.  The patient was discharged home and reports that he was doing okay.  He says that he has been compliant with his insulin with the exception of yesterday where he missed his insulin yesterday.  Otherwise, he says he has been compliant with his medication.  He reports having increased urination for the past few weeks.  He also has felt generally weak for the past few weeks as well.  He had decreased appetite.  No nausea or vomiting.  No diarrhea.  No dysuria.  He has had no fever, cough, chest pain, or any other complaints.  He was evaluated in the emergency room where he was found to have an elevated blood sugar over 600 and was found to have a low bicarbonate and with an elevated anion gap.  Of note, the patient's previous admission was also felt to be DKA due to noncompliance.  The patient has been started on IV fluids and insulin drip and has been referred for admission.  PAST MEDICAL HISTORY: 1. Insulin-dependent diabetes. 2. Hyperlipidemia. 3. History of prostate cancer. 4. Hypothyroidism. 5. Chronic anemia. 6. History of alcohol abuse.  MEDICATIONS PRIOR TO ADMISSION: 1. Thiamine 100 mg daily. 2. Synthroid 100 mcg daily. 3. Simvastatin 20 mg half tablet every morning. 4. Ranitidine 150 mg b.i.d. 5. Folic acid 1 mg daily. 6. Ferrous sulfate 325 mg twice  daily. 7. Aspirin 81 mg daily. 8. NovoLog 70/30, 18 units in the morning and 14 units at night. 9. Tylenol 325 mg 2 tablets every 4 hours as needed.  SOCIAL HISTORY:  The patient is a former smoker and quit smoking 3 years ago.  He reports that he has an occasional beer may be 3 times week, but per records, he used to drink much more in the past.  He lives with his wife in [place] as well as daughter.  FAMILY HISTORY:  Noncontributory.  REVIEW OF SYSTEMS:  All systems reviewed and pertinent positives as stated in the HPI, otherwise negative.  PHYSICAL EXAMINATION:  VITAL SIGNS:  Temperature 97.4, respiratory rate of 16, heart rate of 78, and blood pressure 116/52. GENERAL:  The patient is in no acute distress, lying in bed. HEENT:  Normocephalic and atraumatic.  Pupils are equal, round, and reactive to light. NECK:  Supple. CHEST:  Clear to auscultation bilaterally. CARDIAC:  Shows S1 and S2 with regular rate and rhythm. ABDOMEN:  Soft and nontender.  Bowel sounds are active. EXTREMITIES:  Show no cyanosis or clubbing.  There is trace pedal edema. NEUROLOGICAL:  The patient has equal strength  bilaterally.  Cranial nerves II through XII appear to be grossly intact.  LABORATORY DATA:  Sodium 127, potassium 6.1, chloride 88, bicarbonate 14, glucose 612, BUN 25, creatinine 1.5, and calcium of 8.9.  Urinalysis is negative for nitrites and leukocyte.  WBC 12.6, hemoglobin 9.5, and platelet count of 277.  ASSESSMENT AND PLAN: 1. Moderate diabetic ketoacidosis likely secondary to noncompliance. 2. Dehydration. 3. Acute renal failure. 4. Uncontrolled diabetes. 5. Hypothyroidism. 6. Hyperkalemia. 7. Hyponatremia. 8. Hyperglycemia. 9. Hyperlipidemia. 10.Full code.  The patient will be admitted to the step-down unit for DKA protocol.  He will be aggressively hydrated with IV fluids as well as placed on IV insulin.  Once his anion gap is closed, he may be started on  basal insulin with sliding scale coverage.  We will continue his remainder of his outpatient medications for his thyroid as well as statin.  The patient will need to be set up with outpatient diabetes education and will need to be reinforced the importance of compliance with his medication.  Further orders will be per the clinical course.     Erick Blinks, MD     JM/MEDQ  D:  05/04/2011  T:  05/04/2011  Job:  [id number]  Electronically Signed by Durward Mallard MEMON  on 05/05/2011 02:04:44 PM

## 2011-05-06 LAB — CBC
HCT: 25.2 % — ABNORMAL LOW (ref 39.0–52.0)
MCH: 33.5 pg (ref 26.0–34.0)
MCV: 99.2 fL (ref 78.0–100.0)
Platelets: 262 10*3/uL (ref 150–400)
RBC: 2.54 MIL/uL — ABNORMAL LOW (ref 4.22–5.81)
RDW: 14.5 % (ref 11.5–15.5)
WBC: 7.9 10*3/uL (ref 4.0–10.5)

## 2011-05-06 LAB — GLUCOSE, CAPILLARY: Glucose-Capillary: 87 mg/dL (ref 70–99)

## 2011-05-06 LAB — BASIC METABOLIC PANEL
BUN: 18 mg/dL (ref 6–23)
CO2: 23 mEq/L (ref 19–32)
Calcium: 7.7 mg/dL — ABNORMAL LOW (ref 8.4–10.5)
Chloride: 103 mEq/L (ref 96–112)
Creatinine, Ser: 0.98 mg/dL (ref 0.50–1.35)

## 2011-05-09 LAB — GLUCOSE, CAPILLARY: Glucose-Capillary: 128 mg/dL — ABNORMAL HIGH (ref 70–99)

## 2011-05-09 NOTE — Discharge Summary (Signed)
[name], [name] NO.:  [id number]  MEDICAL RECORD NO.:  [id number]  LOCATION:  1513                         FACILITY:  [facility]  PHYSICIAN:  Hillery Aldo, M.D.   DATE OF BIRTH:  Dec 27, 1928  DATE OF ADMISSION:  05/04/2011 DATE OF DISCHARGE:  05/06/2011                              DISCHARGE SUMMARY   PRIMARY CARE PHYSICIAN:  [facility].  DISCHARGE DIAGNOSES: 1. Diabetic ketoacidosis secondary to noncompliance with insulin     therapy. 2. Dehydration. 3. Acute renal failure. 4. Uncontrolled type 2 diabetes. 5. Hypothyroidism. 6. Hyperkalemia. 7. Mild hyponatremia. 8. Hyperlipidemia. 9. Macrocytic anemia. 10.Medical nonadherence. 11.History of alcohol abuse.  DISCHARGE MEDICATIONS: 1. Tylenol 650 mg p.o. q.4 hours p.r.n. 2. Aspirin 81 mg p.o. daily. 3. Ferrous sulfate 325 mg p.o. b.i.d. 4. Folic acid 1 mg p.o. daily. 5. NovoLog Mix 70/30 18 units subcutaneously q.a.m., 14 units     subcutaneously q.p.m. 6. Ranitidine 150 mg p.o. b.i.d. 7. Simvastatin 10 mg p.o. daily. 8. Synthroid 100 mcg p.o. daily. 9. Thiamine 100 mg p.o. daily.  CONSULTATION:  Registered dietitian.  BRIEF ADMISSION HISTORY OF PRESENT ILLNESS:  The patient is an 77 year old male who presented to the hospital with generalized weakness and feeling of unwellness.  He has a known history of type 2 diabetes and a recent hospitalization for diabetic ketoacidosis.  Upon initial evaluation in the emergency department, the patient was found to have recurrent diabetic ketoacidosis, felt to be due to noncompliance with insulin therapy.  Subsequently, was referred to the hospitalist service for further evaluation and treatment.  For full details, please see the dictated report done by Dr. Kerry Hough.  PROCEDURES AND DIAGNOSTIC STUDIES:  None.  DISCHARGE LABORATORY VALUES:  Sodium is 133, potassium 4.2, chloride 103, bicarb 23, BUN 18, creatinine 0.98, glucose 86, and calcium 7.7. White  blood cell count was 7.9, hemoglobin 8.5, hematocrit 25.2, and platelets 262.  Hemoglobin A1c was 10.4.  TSH was 6.181 (the patient has been noncompliant with Synthroid therapy).  HOSPITAL COURSE BY PROBLEM: 1. Diabetic ketoacidosis:  The patient was admitted and put on an     insulin drip per the [facility] protocol.  Within 24 hours, he     met criteria for discontinuation of the insulin drip and     transitioned to basal/bolus insulin.  His anion gap closed and his     acidosis resolved relatively quickly and his glycemic control has     been good over the past 24 hours with CBG values ranging from 87-     136.  The patient admits to missing doses of his insulin.  At this     point, he has been advised that he needs to take his insulin     regularly and not to skip any doses.  Because this has happened     twice in a relatively short period of time, we will also set up a     home safety evaluation with home health nursing and social worker     support. 2. Dehydration/acute renal failure:  [name] creatinine was 1.5     on admission with IV fluids and treatment of dehydration,  his     discharge creatinine is 0.98 and his dehydration has resolved. 3. Uncontrolled type 2 diabetes:  The patient was seen in evaluation     by the dietitian.  We will also set him up with outpatient diabetes     education.  I suspect his hemoglobin A1c of 10.4 is reflective of     his noncompliance with insulin therapy.  He had good glycemic     control while in the hospital with a 20 unit dose of basal insulin     along with sliding scale.  He can resume his normal dose of 70/30     at discharge. 4. Hypothyroidism:  [name] TSH was elevated, but he has been     noncompliant with Synthroid.  His dosage of Synthroid therefore was     not adjusted. 5. Hyperkalemia:  Transient and responded to IV fluids. 6. Mild hyponatremia.  This was most likely partially     pseudohyponatremia related to the  patient's markedly elevated blood     glucoses and is resolving. 7. Hyperlipidemia:  The patient was maintained on statin therapy. 8. Macrocytic anemia:  [name] hemoglobin and hematocrit have     been stable. 9. Medical nonadherence:  Child psychotherapist and home health nurse for     safety evaluations have been requested.  DISPOSITION:  The patient is medically stable and will be discharged home.  DISCHARGE INSTRUCTIONS:  Activity:  Increase activity slowly.  May shower/bathe.  Diet:  Diabetic.  FOLLOWUP:  With [facility] in 1 week.  Call for an appointment.  Other instructions:  Do not skip doses of medications or insulin.  CONDITION ON DISCHARGE:  Stable.  Time spent coordinating care for discharge and discharge instructions including face-to-face time was approximately 35 minutes.     Hillery Aldo, M.D.     CR/MEDQ  D:  05/06/2011  T:  05/06/2011  Job:  [id number]  cc:   [employer]  Electronically Signed by Hillery Aldo M.D. on 05/09/2011 05:24:49 PM

## 2011-05-11 LAB — GLUCOSE, CAPILLARY
Comment 2: 2302785
Glucose-Capillary: >600 mg/dL (ref 70–99)

## 2011-06-06 LAB — GLUCOSE, CAPILLARY
Glucose-Capillary: 136 — ABNORMAL HIGH
Glucose-Capillary: 144 — ABNORMAL HIGH
Glucose-Capillary: 158 — ABNORMAL HIGH
Glucose-Capillary: 165 — ABNORMAL HIGH
Glucose-Capillary: 167 — ABNORMAL HIGH
Glucose-Capillary: 168 — ABNORMAL HIGH
Glucose-Capillary: 174 — ABNORMAL HIGH
Glucose-Capillary: 175 — ABNORMAL HIGH
Glucose-Capillary: 184 — ABNORMAL HIGH
Glucose-Capillary: 187 — ABNORMAL HIGH
Glucose-Capillary: 228 — ABNORMAL HIGH
Glucose-Capillary: 252 — ABNORMAL HIGH
Glucose-Capillary: 257 — ABNORMAL HIGH
Glucose-Capillary: 258 — ABNORMAL HIGH
Glucose-Capillary: 57 — ABNORMAL LOW
Glucose-Capillary: 57 — ABNORMAL LOW
Glucose-Capillary: 59 — ABNORMAL LOW
Glucose-Capillary: 60 — ABNORMAL LOW
Glucose-Capillary: 61 — ABNORMAL LOW
Glucose-Capillary: 91

## 2011-06-06 LAB — URINALYSIS, ROUTINE W REFLEX MICROSCOPIC
Bilirubin Urine: NEGATIVE
Ketones, ur: NEGATIVE
Nitrite: NEGATIVE
Protein, ur: NEGATIVE
pH: 5.5

## 2011-06-06 LAB — COMPREHENSIVE METABOLIC PANEL
ALT: 23
AST: 49 — ABNORMAL HIGH
Albumin: 3.6
Alkaline Phosphatase: 59
Calcium: 8.6
GFR calc Af Amer: >60
Glucose, Bld: 41 — ABNORMAL LOW
Potassium: 3.6
Sodium: 134 — ABNORMAL LOW
Total Protein: 7.4

## 2011-06-06 LAB — BASIC METABOLIC PANEL
BUN: 16
BUN: 32 — ABNORMAL HIGH
CO2: 33 — ABNORMAL HIGH
CO2: 35 — ABNORMAL HIGH
Calcium: 8.6
Calcium: 8.8
Chloride: 90 — ABNORMAL LOW
Chloride: 93 — ABNORMAL LOW
Chloride: 94 — ABNORMAL LOW
Creatinine, Ser: 1.6 — ABNORMAL HIGH
Creatinine, Ser: 1.64 — ABNORMAL HIGH
Creatinine, Ser: 1.66 — ABNORMAL HIGH
Creatinine, Ser: 1.94 — ABNORMAL HIGH
GFR calc Af Amer: 41 — ABNORMAL LOW
GFR calc Af Amer: 49 — ABNORMAL LOW
GFR calc Af Amer: 50 — ABNORMAL LOW
GFR calc non Af Amer: 40 — ABNORMAL LOW
Glucose, Bld: 271 — ABNORMAL HIGH
Potassium: 3.8
Sodium: 133 — ABNORMAL LOW
Sodium: 135

## 2011-06-06 LAB — CBC
Hemoglobin: 10.7 — ABNORMAL LOW
Hemoglobin: 9 — ABNORMAL LOW
MCHC: 33.6
MCHC: 33.9
MCV: 102.3 — ABNORMAL HIGH
MCV: 103 — ABNORMAL HIGH
Platelets: 268
Platelets: 301
RBC: 2.59 — ABNORMAL LOW
RBC: 2.89 — ABNORMAL LOW
RBC: 3.09 — ABNORMAL LOW
RDW: 15.7 — ABNORMAL HIGH
WBC: 10.5
WBC: 10.8 — ABNORMAL HIGH
WBC: 7

## 2011-06-06 LAB — PROTEIN, URINE, 24 HOUR
Collection Interval-UPROT: 24
Protein, 24H Urine: 37 — ABNORMAL LOW
Urine Total Volume-UPROT: 1825

## 2011-06-06 LAB — B-NATRIURETIC PEPTIDE (CONVERTED LAB): Pro B Natriuretic peptide (BNP): <30

## 2011-06-06 LAB — TROPONIN I: Troponin I: 0.01

## 2011-06-06 LAB — HEPATIC FUNCTION PANEL
Albumin: 3.1 — ABNORMAL LOW
Total Protein: 6.9

## 2011-06-06 LAB — VITAMIN B12: Vitamin B-12: 213 (ref 211–911)

## 2011-06-06 LAB — CK: Total CK: 1153 — ABNORMAL HIGH

## 2011-06-06 LAB — PSA: PSA: 11.98 — ABNORMAL HIGH

## 2011-06-07 LAB — GLUCOSE, CAPILLARY
Glucose-Capillary: 29 — CL
Glucose-Capillary: 57 — ABNORMAL LOW

## 2011-06-09 LAB — CBC
HCT: 26.4 % — ABNORMAL LOW (ref 39.0–52.0)
HCT: 32.6 % — ABNORMAL LOW (ref 39.0–52.0)
Hemoglobin: 8.8 g/dL — ABNORMAL LOW (ref 13.0–17.0)
MCV: 103.4 fL — ABNORMAL HIGH (ref 78.0–100.0)
MCV: 104.8 fL — ABNORMAL HIGH (ref 78.0–100.0)
Platelets: 389 10*3/uL (ref 150–400)
RDW: 15.4 % (ref 11.5–15.5)
RDW: 16.2 % — ABNORMAL HIGH (ref 11.5–15.5)

## 2011-06-09 LAB — BASIC METABOLIC PANEL
BUN: 24 mg/dL — ABNORMAL HIGH (ref 6–23)
CO2: 20 mEq/L (ref 19–32)
CO2: 23 mEq/L (ref 19–32)
CO2: 24 mEq/L (ref 19–32)
CO2: 9 mEq/L — CL (ref 19–32)
Calcium: 8.7 mg/dL (ref 8.4–10.5)
Chloride: 102 mEq/L (ref 96–112)
Chloride: 106 mEq/L (ref 96–112)
Chloride: 107 mEq/L (ref 96–112)
Chloride: 108 mEq/L (ref 96–112)
Creatinine, Ser: 1.3 mg/dL (ref 0.4–1.5)
Creatinine, Ser: 1.86 mg/dL — ABNORMAL HIGH (ref 0.4–1.5)
GFR calc Af Amer: 43 mL/min — ABNORMAL LOW (ref >60)
GFR calc Af Amer: 48 mL/min — ABNORMAL LOW (ref >60)
GFR calc non Af Amer: 35 mL/min — ABNORMAL LOW (ref >60)
GFR calc non Af Amer: 46 mL/min — ABNORMAL LOW (ref >60)
GFR calc non Af Amer: >60 mL/min (ref >60)
Glucose, Bld: 128 mg/dL — ABNORMAL HIGH (ref 70–99)
Glucose, Bld: 226 mg/dL — ABNORMAL HIGH (ref 70–99)
Potassium: 3.6 mEq/L (ref 3.5–5.1)
Potassium: 3.7 mEq/L (ref 3.5–5.1)
Potassium: 3.9 mEq/L (ref 3.5–5.1)
Sodium: 126 mEq/L — ABNORMAL LOW (ref 135–145)
Sodium: 135 mEq/L (ref 135–145)
Sodium: 140 mEq/L (ref 135–145)

## 2011-06-09 LAB — COMPREHENSIVE METABOLIC PANEL
Albumin: 3.6 g/dL (ref 3.5–5.2)
BUN: 27 mg/dL — ABNORMAL HIGH (ref 6–23)
Calcium: 9.3 mg/dL (ref 8.4–10.5)
Creatinine, Ser: 2.14 mg/dL — ABNORMAL HIGH (ref 0.4–1.5)
Total Bilirubin: 2.4 mg/dL — ABNORMAL HIGH (ref 0.3–1.2)
Total Protein: 7.3 g/dL (ref 6.0–8.3)

## 2011-06-09 LAB — GLUCOSE, CAPILLARY
Glucose-Capillary: 122 mg/dL — ABNORMAL HIGH (ref 70–99)
Glucose-Capillary: 135 mg/dL — ABNORMAL HIGH (ref 70–99)
Glucose-Capillary: 172 mg/dL — ABNORMAL HIGH (ref 70–99)
Glucose-Capillary: 219 mg/dL — ABNORMAL HIGH (ref 70–99)
Glucose-Capillary: 227 mg/dL — ABNORMAL HIGH (ref 70–99)
Glucose-Capillary: 278 mg/dL — ABNORMAL HIGH (ref 70–99)
Glucose-Capillary: 324 mg/dL — ABNORMAL HIGH (ref 70–99)
Glucose-Capillary: >600 mg/dL (ref 70–99)
Glucose-Capillary: >600 mg/dL (ref 70–99)

## 2011-06-09 LAB — URINALYSIS, ROUTINE W REFLEX MICROSCOPIC
Bilirubin Urine: NEGATIVE
Hgb urine dipstick: NEGATIVE
Protein, ur: NEGATIVE mg/dL
Urobilinogen, UA: 0.2 mg/dL (ref 0.0–1.0)

## 2011-06-09 LAB — DIFFERENTIAL
Basophils Absolute: 0.5 10*3/uL — ABNORMAL HIGH (ref 0.0–0.1)
Lymphocytes Relative: 3 % — ABNORMAL LOW (ref 12–46)
Monocytes Absolute: 0.5 10*3/uL (ref 0.1–1.0)
Monocytes Relative: 4 % (ref 3–12)
Neutro Abs: 12.7 10*3/uL — ABNORMAL HIGH (ref 1.7–7.7)

## 2011-06-09 LAB — POCT I-STAT, CHEM 8
Chloride: 99 mEq/L (ref 96–112)
Glucose, Bld: >700 mg/dL (ref 70–99)
HCT: 37 % — ABNORMAL LOW (ref 39.0–52.0)
Potassium: 6 mEq/L — ABNORMAL HIGH (ref 3.5–5.1)

## 2011-06-09 LAB — OCCULT BLOOD X 1 CARD TO LAB, STOOL: Fecal Occult Bld: NEGATIVE

## 2011-10-19 ENCOUNTER — Emergency Department (HOSPITAL_COMMUNITY): Payer: Medicare Other

## 2011-10-19 ENCOUNTER — Encounter (HOSPITAL_COMMUNITY): Payer: Self-pay | Admitting: Emergency Medicine

## 2011-10-19 ENCOUNTER — Other Ambulatory Visit: Payer: Self-pay

## 2011-10-19 ENCOUNTER — Observation Stay (HOSPITAL_COMMUNITY)
Admission: EM | Admit: 2011-10-19 | Discharge: 2011-10-24 | DRG: 690 | Disposition: A | Discharge status: Home / Self Care | Payer: Medicare Other | Attending: Internal Medicine | Admitting: Internal Medicine

## 2011-10-19 DIAGNOSIS — Z794 Long term (current) use of insulin: Secondary | ICD-10-CM | POA: Insufficient documentation

## 2011-10-19 DIAGNOSIS — C61 Malignant neoplasm of prostate: Secondary | ICD-10-CM | POA: Insufficient documentation

## 2011-10-19 DIAGNOSIS — D649 Anemia, unspecified: Secondary | ICD-10-CM

## 2011-10-19 DIAGNOSIS — E785 Hyperlipidemia, unspecified: Secondary | ICD-10-CM | POA: Insufficient documentation

## 2011-10-19 DIAGNOSIS — E039 Hypothyroidism, unspecified: Secondary | ICD-10-CM | POA: Insufficient documentation

## 2011-10-19 DIAGNOSIS — N390 Urinary tract infection, site not specified: Principal | ICD-10-CM | POA: Insufficient documentation

## 2011-10-19 DIAGNOSIS — E119 Type 2 diabetes mellitus without complications: Secondary | ICD-10-CM | POA: Diagnosis present

## 2011-10-19 DIAGNOSIS — R5381 Other malaise: Secondary | ICD-10-CM | POA: Insufficient documentation

## 2011-10-19 DIAGNOSIS — D638 Anemia in other chronic diseases classified elsewhere: Secondary | ICD-10-CM | POA: Insufficient documentation

## 2011-10-19 DIAGNOSIS — E876 Hypokalemia: Secondary | ICD-10-CM | POA: Insufficient documentation

## 2011-10-19 DIAGNOSIS — IMO0001 Reserved for inherently not codable concepts without codable children: Secondary | ICD-10-CM | POA: Insufficient documentation

## 2011-10-19 DIAGNOSIS — I672 Cerebral atherosclerosis: Secondary | ICD-10-CM | POA: Insufficient documentation

## 2011-10-19 DIAGNOSIS — Z79899 Other long term (current) drug therapy: Secondary | ICD-10-CM | POA: Insufficient documentation

## 2011-10-19 DIAGNOSIS — I10 Essential (primary) hypertension: Secondary | ICD-10-CM | POA: Insufficient documentation

## 2011-10-19 DIAGNOSIS — R531 Weakness: Secondary | ICD-10-CM

## 2011-10-19 HISTORY — DX: Anemia, unspecified: D64.9

## 2011-10-19 HISTORY — DX: Malignant (primary) neoplasm, unspecified: C80.1

## 2011-10-19 HISTORY — DX: Hyperlipidemia, unspecified: E78.5

## 2011-10-19 HISTORY — DX: Disorder of thyroid, unspecified: E07.9

## 2011-10-19 LAB — URINE MICROSCOPIC-ADD ON

## 2011-10-19 LAB — POCT I-STAT TROPONIN I: Troponin i, poc: 0 ng/mL (ref 0.00–0.08)

## 2011-10-19 LAB — POCT I-STAT, CHEM 8
BUN: 22 mg/dL (ref 6–23)
Calcium, Ion: 1.24 mmol/L (ref 1.12–1.32)
Chloride: 98 mEq/L (ref 96–112)
Glucose, Bld: 341 mg/dL — ABNORMAL HIGH (ref 70–99)

## 2011-10-19 LAB — CBC
MCV: 98.5 fL (ref 78.0–100.0)
Platelets: 272 10*3/uL (ref 150–400)
RBC: 3.31 MIL/uL — ABNORMAL LOW (ref 4.22–5.81)
WBC: 7.5 10*3/uL (ref 4.0–10.5)

## 2011-10-19 LAB — URINALYSIS, ROUTINE W REFLEX MICROSCOPIC
Glucose, UA: 500 mg/dL — AB
Leukocytes, UA: NEGATIVE
pH: 6 (ref 5.0–8.0)

## 2011-10-19 LAB — DIFFERENTIAL
Lymphocytes Relative: 16 % (ref 12–46)
Lymphs Abs: 1.2 10*3/uL (ref 0.7–4.0)
Neutrophils Relative %: 73 % (ref 43–77)

## 2011-10-19 MED ORDER — SODIUM CHLORIDE 0.9 % IV SOLN
INTRAVENOUS | Status: DC
  Administered 2011-10-19: 17:00:00 via INTRAVENOUS

## 2011-10-19 MED ORDER — SODIUM CHLORIDE 0.9 % IV BOLUS (SEPSIS)
700.0000 mL | Freq: Once | INTRAVENOUS | Status: AC
  Administered 2011-10-19: 16:00:00 via INTRAVENOUS

## 2011-10-19 NOTE — ED Provider Notes (Signed)
History     CSN: [id number]  Arrival date & time 10/19/11  1313   First MD Initiated Contact with Patient 10/19/11 1347      Chief Complaint  Patient presents with  . Weakness    hypotensive    (Consider location/radiation/quality/duration/timing/severity/associated sxs/prior treatment) HPI  Patient relates he's been feeling weak for the past few months. He relates however he has started having frequency urgency and his legs are getting weaker. He states last week he had nausea and vomiting but that has resolved. He denies diarrhea, abdominal pain, chest pain, headache. He relates standing his heart p.m. to sometimes his legs give out.Marland KitchenHe relates nothing makes him feel better  PCP [facility] in [facility]  Past Medical History  Diagnosis Date  . Diabetes mellitus   . Cancer   . Thyroid disease   . Anemia   . Hyperlipidemia     No past surgical history on file.  No family history on file.  History  Substance Use Topics  . Smoking status: No former smoker  . Smokeless tobacco: Not on file  . Alcohol Use: no   lives at home with wife and family States she uses a walker at times    Review of Systems  All other systems reviewed and are negative.    Allergies  Review of patient's allergies indicates no known allergies.  Home Medications   Current Outpatient Rx  Name Route Sig Dispense Refill  . CYANOCOBALAMIN 1000 MCG PO TABS Oral Take 100 mcg by mouth daily.    Marland Kitchen FERROUS SULFATE 325 (65 FE) MG PO TABS Oral Take 325 mg by mouth daily with breakfast.    . FUROSEMIDE 20 MG PO TABS Oral Take 20 mg by mouth daily.    . INSULIN ASPART PROT & ASPART (70-30) 100 UNIT/ML Eek SUSP Subcutaneous Inject 15-18 Units into the skin 2 (two) times daily. Pt takes 15 units in the morning. 18 units at night    . LEVOTHYROXINE SODIUM 125 MCG PO TABS Oral Take 125 mcg by mouth daily.    Marland Kitchen RANITIDINE HCL 150 MG PO TABS Oral Take 150 mg by mouth 2 (two) times daily.    Marland Kitchen SIMVASTATIN 20  MG PO TABS Oral Take 20 mg by mouth every evening.    Marland Kitchen TAMSULOSIN HCL 0.4 MG PO CAPS Oral Take by mouth.    Marland Kitchen VITAMIN C 500 MG PO TABS Oral Take 500 mg by mouth 2 (two) times daily.      BP 123/70  Pulse 58  Temp(Src) 97.5 F (36.4 C) (Oral)  Resp 12  SpO2 100%  Vital signs normal minor bradycardia   Physical Exam  Nursing note and vitals reviewed. Constitutional: He is oriented to person, place, and time. He appears well-developed and well-nourished.  Non-toxic appearance. He does not appear ill. No distress.  HENT:  Head: Normocephalic and atraumatic.  Right Ear: External ear normal.  Left Ear: External ear normal.  Nose: Nose normal. No mucosal edema or rhinorrhea.  Mouth/Throat: Mucous membranes are normal. No dental abscesses or uvula swelling.       Mucus membranes are dry  Eyes: Conjunctivae and EOM are normal. Pupils are equal, round, and reactive to light.  Neck: Normal range of motion and full passive range of motion without pain. Neck supple.  Cardiovascular: Normal rate, regular rhythm and normal heart sounds.  Exam reveals no gallop and no friction rub.   No murmur heard. Pulmonary/Chest: Effort normal and breath sounds normal.  No respiratory distress. He has no wheezes. He has no rhonchi. He has no rales. He exhibits no tenderness and no crepitus.  Abdominal: Soft. Normal appearance and bowel sounds are normal. He exhibits no distension. There is no tenderness. There is no rebound and no guarding.  Musculoskeletal: Normal range of motion. He exhibits no edema and no tenderness.       Moves all extremities well.   Neurological: He is alert and oriented to person, place, and time. He has normal strength. No cranial nerve deficit.  Skin: Skin is warm, dry and intact. No rash noted. No erythema. No pallor.  Psychiatric: His speech is normal and behavior is normal. His mood appears not anxious.       Flat affect    ED Course  Procedures (including critical care  time)   Pt received IV fluids and nausea medication. States he is feeling alittle better.   PT turned over at change of shift to Dr Rosalia Hammers waiting for UA and total CK to be resulted.   Results for orders placed during the hospital encounter of 10/19/11  CBC      Component Value Range   WBC 7.5  4.0 - 10.5 (K/uL)   RBC 3.31 (*) 4.22 - 5.81 (MIL/uL)   Hemoglobin 10.6 (*) 13.0 - 17.0 (g/dL)   HCT 56.2 (*) 13.0 - 52.0 (%)   MCV 98.5  78.0 - 100.0 (fL)   MCH 32.0  26.0 - 34.0 (pg)   MCHC 32.5  30.0 - 36.0 (g/dL)   RDW 86.5  78.4 - 69.6 (%)   Platelets 272  150 - 400 (K/uL)  DIFFERENTIAL      Component Value Range   Neutrophils Relative 73  43 - 77 (%)   Neutro Abs 5.5  1.7 - 7.7 (K/uL)   Lymphocytes Relative 16  12 - 46 (%)   Lymphs Abs 1.2  0.7 - 4.0 (K/uL)   Monocytes Relative 8  3 - 12 (%)   Monocytes Absolute 0.6  0.1 - 1.0 (K/uL)   Eosinophils Relative 4  0 - 5 (%)   Eosinophils Absolute 0.3  0.0 - 0.7 (K/uL)   Basophils Relative 0  0 - 1 (%)   Basophils Absolute 0.0  0.0 - 0.1 (K/uL)  POCT I-STAT, CHEM 8      Component Value Range   Sodium 137  135 - 145 (mEq/L)   Potassium 3.9  3.5 - 5.1 (mEq/L)   Chloride 98  96 - 112 (mEq/L)   BUN 22  6 - 23 (mg/dL)   Creatinine, Ser 2.95  0.50 - 1.35 (mg/dL)   Glucose, Bld 284 (*) 70 - 99 (mg/dL)   Calcium, Ion 1.32  4.40 - 1.32 (mmol/L)   TCO2 31  0 - 100 (mmol/L)   Hemoglobin 12.6 (*) 13.0 - 17.0 (g/dL)   HCT 10.2 (*) 72.5 - 52.0 (%)  POCT I-STAT TROPONIN I      Component Value Range   Troponin i, poc 0.00  0.00 - 0.08 (ng/mL)   Comment 3            Laboratory interpretation all normal except anemia which is improved, hyperglycemia    Date: 10/19/2011  Rate: 57  Rhythm: sinus bradycardia  QRS Axis: left  Intervals: normal  ST/T Wave abnormalities: nonspecific T wave changes  Conduction Disutrbances:right bundle branch block  Narrative Interpretation:   Old EKG Reviewed: unchanged from 04/20/2011  Initial  Impression Dehydration hyperglycemia   Plan per Dr  Audery Amel, MD, FACEP    MDM         Ward Givens, MD 10/19/11 2020

## 2011-10-19 NOTE — ED Provider Notes (Cosign Needed Addendum)
Patient with decreased bs to 240 after one liter ns.  Patient states he feels better.  Patient to be ambulated by staff.  Hilario Quarry, MD 10/19/11 1957  9:56 PM Two nurses attempted to ambulate patient and patient unable to walk, very weak and almost fell.   Hilario Quarry, MD 10/19/11 [phone]

## 2011-10-19 NOTE — ED Notes (Signed)
Patient unable to ambulate with standby assistance of 2 RNs. Patient unable to stand unassisted. C/o being weak and dizzy with standing.

## 2011-10-19 NOTE — ED Notes (Signed)
Patient aware of need for urine specimen. Patient unable to void at this time. Patient given urinal. Encouraged to call for assistance if needed.   

## 2011-10-19 NOTE — ED Notes (Signed)
Per pt, blood sugar this am was 385-as of 1900 CBG 280

## 2011-10-19 NOTE — ED Notes (Signed)
Per EMS, states his blood sugar was elevated this am and blood pressure low-states nausea last week, recent weight loss-diagnosed with prostate cancer 2 years ago-currently untreated-right bundle branch block, pt does not know if this is new

## 2011-10-20 ENCOUNTER — Encounter (HOSPITAL_COMMUNITY): Payer: Self-pay | Admitting: Family Medicine

## 2011-10-20 DIAGNOSIS — N390 Urinary tract infection, site not specified: Secondary | ICD-10-CM | POA: Diagnosis present

## 2011-10-20 DIAGNOSIS — R531 Weakness: Secondary | ICD-10-CM | POA: Diagnosis present

## 2011-10-20 DIAGNOSIS — E039 Hypothyroidism, unspecified: Secondary | ICD-10-CM | POA: Diagnosis present

## 2011-10-20 DIAGNOSIS — I10 Essential (primary) hypertension: Secondary | ICD-10-CM | POA: Diagnosis present

## 2011-10-20 DIAGNOSIS — C61 Malignant neoplasm of prostate: Secondary | ICD-10-CM | POA: Diagnosis present

## 2011-10-20 DIAGNOSIS — E119 Type 2 diabetes mellitus without complications: Secondary | ICD-10-CM | POA: Diagnosis present

## 2011-10-20 LAB — BASIC METABOLIC PANEL
Calcium: 9.4 mg/dL (ref 8.4–10.5)
Creatinine, Ser: 0.74 mg/dL (ref 0.50–1.35)
GFR calc non Af Amer: 85 mL/min — ABNORMAL LOW (ref >90)
Sodium: 135 mEq/L (ref 135–145)

## 2011-10-20 LAB — CBC
MCH: 32.6 pg (ref 26.0–34.0)
Platelets: 258 10*3/uL (ref 150–400)
RBC: 3.25 MIL/uL — ABNORMAL LOW (ref 4.22–5.81)
RDW: 13.1 % (ref 11.5–15.5)
WBC: 8.3 10*3/uL (ref 4.0–10.5)

## 2011-10-20 LAB — IRON AND TIBC
Saturation Ratios: 28 % (ref 20–55)
TIBC: 229 ug/dL (ref 215–435)

## 2011-10-20 LAB — HEMOGLOBIN A1C: Hgb A1c MFr Bld: 10 % — ABNORMAL HIGH (ref <5.7)

## 2011-10-20 LAB — FERRITIN: Ferritin: 214 ng/mL (ref 22–322)

## 2011-10-20 LAB — GLUCOSE, CAPILLARY
Glucose-Capillary: 318 mg/dL — ABNORMAL HIGH (ref 70–99)
Glucose-Capillary: 308 mg/dL — ABNORMAL HIGH (ref 70–99)
Glucose-Capillary: 237 mg/dL — ABNORMAL HIGH (ref 70–99)
Glucose-Capillary: 129 mg/dL — ABNORMAL HIGH (ref 70–99)

## 2011-10-20 LAB — VITAMIN B12: Vitamin B-12: 1550 pg/mL — ABNORMAL HIGH (ref 211–911)

## 2011-10-20 LAB — TSH: TSH: 5.757 u[IU]/mL — ABNORMAL HIGH (ref 0.350–4.500)

## 2011-10-20 MED ORDER — LEVOTHYROXINE SODIUM 125 MCG PO TABS
125.0000 ug | ORAL_TABLET | Freq: Every day | ORAL | Status: DC
  Administered 2011-10-20 – 2011-10-24 (×5): 125 ug via ORAL
  Filled 2011-10-20 (×6): qty 1

## 2011-10-20 MED ORDER — INSULIN ASPART PROT & ASPART (70-30 MIX) 100 UNIT/ML ~~LOC~~ SUSP
18.0000 [IU] | Freq: Every day | SUBCUTANEOUS | Status: DC
  Administered 2011-10-20 – 2011-10-21 (×2): 18 [IU] via SUBCUTANEOUS
  Administered 2011-10-22: 15 [IU] via SUBCUTANEOUS
  Filled 2011-10-20: qty 3

## 2011-10-20 MED ORDER — DEXTROSE 5 % IV SOLN
1.0000 g | INTRAVENOUS | Status: DC
  Administered 2011-10-20 – 2011-10-24 (×5): 1 g via INTRAVENOUS
  Filled 2011-10-20 (×7): qty 10

## 2011-10-20 MED ORDER — TAMSULOSIN HCL 0.4 MG PO CAPS
0.4000 mg | ORAL_CAPSULE | Freq: Every day | ORAL | Status: DC
  Administered 2011-10-20 – 2011-10-23 (×4): 0.4 mg via ORAL
  Filled 2011-10-20 (×5): qty 1

## 2011-10-20 MED ORDER — FERROUS SULFATE 325 (65 FE) MG PO TABS
325.0000 mg | ORAL_TABLET | Freq: Every day | ORAL | Status: DC
  Administered 2011-10-20 – 2011-10-21 (×2): 325 mg via ORAL
  Filled 2011-10-20 (×3): qty 1

## 2011-10-20 MED ORDER — ENOXAPARIN SODIUM 30 MG/0.3ML ~~LOC~~ SOLN
30.0000 mg | SUBCUTANEOUS | Status: DC
  Administered 2011-10-20 – 2011-10-21 (×2): 30 mg via SUBCUTANEOUS
  Filled 2011-10-20 (×3): qty 0.3

## 2011-10-20 MED ORDER — INSULIN ASPART 100 UNIT/ML ~~LOC~~ SOLN
0.0000 [IU] | Freq: Three times a day (TID) | SUBCUTANEOUS | Status: DC
  Administered 2011-10-20: 11 [IU] via SUBCUTANEOUS
  Administered 2011-10-20: 5 [IU] via SUBCUTANEOUS
  Administered 2011-10-21: 3 [IU] via SUBCUTANEOUS
  Administered 2011-10-21: 2 [IU] via SUBCUTANEOUS
  Administered 2011-10-21 – 2011-10-22 (×2): 3 [IU] via SUBCUTANEOUS
  Administered 2011-10-22: 2 [IU] via SUBCUTANEOUS
  Administered 2011-10-23: 11 [IU] via SUBCUTANEOUS
  Administered 2011-10-23 (×2): 3 [IU] via SUBCUTANEOUS
  Administered 2011-10-24: 8 [IU] via SUBCUTANEOUS
  Administered 2011-10-24: 5 [IU] via SUBCUTANEOUS
  Filled 2011-10-20: qty 3
  Filled 2011-10-20: qty 1

## 2011-10-20 MED ORDER — FAMOTIDINE 20 MG PO TABS
20.0000 mg | ORAL_TABLET | Freq: Two times a day (BID) | ORAL | Status: DC
  Administered 2011-10-20 – 2011-10-24 (×9): 20 mg via ORAL
  Filled 2011-10-20 (×11): qty 1

## 2011-10-20 MED ORDER — INSULIN ASPART PROT & ASPART (70-30 MIX) 100 UNIT/ML ~~LOC~~ SUSP
15.0000 [IU] | Freq: Every day | SUBCUTANEOUS | Status: DC
  Administered 2011-10-20 – 2011-10-21 (×2): 15 [IU] via SUBCUTANEOUS
  Filled 2011-10-20: qty 3

## 2011-10-20 MED ORDER — INSULIN ASPART 100 UNIT/ML ~~LOC~~ SOLN
SUBCUTANEOUS | Status: AC
  Filled 2011-10-20: qty 1

## 2011-10-20 MED ORDER — SIMVASTATIN 20 MG PO TABS
20.0000 mg | ORAL_TABLET | Freq: Every evening | ORAL | Status: DC
  Administered 2011-10-20 – 2011-10-23 (×4): 20 mg via ORAL
  Filled 2011-10-20 (×6): qty 1

## 2011-10-20 MED ORDER — SODIUM CHLORIDE 0.9 % IV SOLN
INTRAVENOUS | Status: DC
  Administered 2011-10-20: 03:00:00 via INTRAVENOUS
  Administered 2011-10-20: 1000 mL via INTRAVENOUS
  Administered 2011-10-21 (×2): via INTRAVENOUS
  Administered 2011-10-22 – 2011-10-23 (×3): 1000 mL via INTRAVENOUS
  Administered 2011-10-24: 03:00:00 via INTRAVENOUS

## 2011-10-20 NOTE — ED Notes (Signed)
Pt. Was changed gown , sheets.pt. Has pressure ulcer on rt buttock clean and dried.RN Clare Gandy) was notified

## 2011-10-20 NOTE — ED Notes (Signed)
Report given to Stacy, RN

## 2011-10-20 NOTE — ED Notes (Signed)
Pt transported to rm 30

## 2011-10-20 NOTE — ED Notes (Signed)
Patient's IV out. Gauze dressing applied to site; bleeding controlled. New gown provided and linens changed.

## 2011-10-20 NOTE — Progress Notes (Signed)
I have seen and assessed patient and agree with Dr. Lajoyce Lauber assessment and plan.

## 2011-10-20 NOTE — H&P (Signed)
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 She called 911, who redid vitals and felt similar. He was brought to the ER. In the ER his sugars were treated. He was found to have a UA. Arrangements are underway for discharge, but when they went to ambulate the patient he was weak and unable to walk. The patient does reports a chronic weakness but he states is a little bit worse today. He uses a rolling walker. Here in the ER the patient's blood pressure was adequate. History provided by the patient. Reports no fevers, no chills, no nausea, no vomiting, no altered mental status.  Review of Systems:   anorexia, fever, weight loss,, vision loss, decreased hearing, hoarseness, chest pain, syncope, dyspnea on exertion, peripheral edema, balance deficits, hemoptysis, abdominal pain, melena, hematochezia, severe indigestion/heartburn, hematuria, incontinence, genital sores, muscle weakness, suspicious skin lesions, transient blindness, difficulty walking, depression, unusual weight change, abnormal bleeding, enlarged lymph nodes, angioedema, and breast masses.  Past Medical History: Past Medical History  Diagnosis Date  . Diabetes mellitus   . Cancer   . Thyroid disease   . Anemia   . Hyperlipidemia    No past surgical history on file.  Medications: Prior to Admission medications   Medication Sig Start Date End Date Taking? Authorizing Provider  cyanocobalamin 1000 MCG tablet Take 100 mcg by mouth daily.   Yes Historical Provider, MD  ferrous sulfate 325 (65 FE) MG tablet Take 325 mg by mouth daily with breakfast.   Yes Historical Provider, MD  furosemide (LASIX) 20 MG tablet Take 20 mg by mouth daily.   Yes Historical Provider, MD  insulin aspart protamine-insulin  aspart (NOVOLOG 70/30) (70-30) 100 UNIT/ML injection Inject 15-18 Units into the skin 2 (two) times daily. Pt takes 15 units in the morning. 18 units at night   Yes Historical Provider, MD  levothyroxine (SYNTHROID, LEVOTHROID) 125 MCG tablet Take 125 mcg by mouth daily.   Yes Historical Provider, MD  ranitidine (ZANTAC) 150 MG tablet Take 150 mg by mouth 2 (two) times daily.   Yes Historical Provider, MD  simvastatin (ZOCOR) 20 MG tablet Take 20 mg by mouth every evening.   Yes Historical Provider, MD  Tamsulosin HCl (FLOMAX) 0.4 MG CAPS Take by mouth.   Yes Historical Provider, MD  vitamin C (ASCORBIC ACID) 500 MG tablet Take 500 mg by mouth 2 (two) times daily.   Yes Historical Provider, MD    Allergies:  No Known Allergies  Social History:  reports that he has quit smoking. He does not have any smokeless tobacco history on file. He reports that he does not drink alcohol or use illicit drugs.  Family History: Family History  Problem Relation Age of Onset  . Hypertension      Physical Exam: Filed Vitals:   10/19/11 2217 10/19/11 2218 10/19/11 2220 10/20/11 0047  BP: 144/73 126/73 102/51 128/80  Pulse: 60 67 64 58  Temp:      TempSrc:      Resp:    15  SpO2:    97%    General:  Alert and oriented times three, well developed and nourished, no acute distress, weak Eyes: PERRLA, pink conjunctiva, no scleral icterus ENT: Moist oral mucosa,  neck supple, no thyromegaly Lungs: clear to ascultation, no wheeze, no crackles, no use of accessory muscles Cardiovascular: regular rate and rhythm, no regurgitation, no gallops, no murmurs. No carotid bruits, no JVD Abdomen: soft, positive BS, non-tender, non-distended, no organomegaly, not an acute abdomen GU: not examined Neuro: CN II - XII grossly intact, sensation intact Musculoskeletal: strength 4/5 all extremities, no clubbing, cyanosis or edema Skin: no rash, no subcutaneous crepitation, no decubitus Psych: appropriate  patient   Labs on Admission:   Basename 10/19/11 1527  NA 137  K 3.9  CL 98  CO2 --  GLUCOSE 341*  BUN 22  CREATININE 1.20  CALCIUM --  MG --  PHOS --   No results found for this basename: AST:2,ALT:2,ALKPHOS:2,BILITOT:2,PROT:2,ALBUMIN:2 in the last 72 hours No results found for this basename: LIPASE:2,AMYLASE:2 in the last 72 hours  Basename 10/19/11 1527 10/19/11 1510  WBC -- 7.5  NEUTROABS -- 5.5  HGB 12.6* 10.6*  HCT 37.0* 32.6*  MCV -- 98.5  PLT -- 272    Basename 10/19/11 1510  CKTOTAL 54  CKMB --  CKMBINDEX --  TROPONINI --   No components found with this basename: POCBNP:3 No results found for this basename: DDIMER:2 in the last 72 hours No results found for this basename: HGBA1C:2 in the last 72 hours No results found for this basename: CHOL:2,HDL:2,LDLCALC:2,TRIG:2,CHOLHDL:2,LDLDIRECT:2 in the last 72 hours No results found for this basename: TSH,T4TOTAL,FREET3,T3FREE,THYROIDAB in the last 72 hours No results found for this basename: VITAMINB12:2,FOLATE:2,FERRITIN:2,TIBC:2,IRON:2,RETICCTPCT:2 in the last 72 hours  Micro Results: No results found for this or any previous visit (from the past 240 hour(s)). Results for JAHBARI, REPINSKI (MRN [id number]) as of 10/20/2011 01:34  Ref. Range 10/19/2011 18:15  Color, Urine Latest Range: YELLOW  YELLOW  APPearance Latest Range: CLEAR  CLEAR  Specific Gravity, Urine Latest Range: 1.005-1.030  1.038 (H)  pH Latest Range: 5.0-8.0  6.0  Glucose, UA Latest Range: NEGATIVE mg/dL 409 (A)  Bilirubin Urine Latest Range: NEGATIVE  NEGATIVE  Ketones, ur Latest Range: NEGATIVE mg/dL 40 (A)  Protein Latest Range: NEGATIVE mg/dL 30 (A)  Urobilinogen, UA Latest Range: 0.0-1.0 mg/dL 0.2  Nitrite Latest Range: NEGATIVE  POSITIVE (A)  Leukocytes, UA Latest Range: NEGATIVE  NEGATIVE  Hgb urine dipstick Latest Range: NEGATIVE  NEGATIVE  WBC, UA Latest Range: <3 WBC/hpf 0-2  Bacteria, UA Latest Range: RARE  MANY (A)     Radiological Exams on Admission: Ct Head Wo Contrast  10/19/2011  *RADIOLOGY REPORT*  Clinical Data: 78 year old male with weakness.  CT HEAD WITHOUT CONTRAST  Technique:  Contiguous axial images were obtained from the base of the skull through the vertex without contrast.  Comparison: None.  Findings: Visualized paranasal sinuses and mastoids are clear.  Are pass bones no acute orbit or scalp soft tissue findings.  Mild Calcified atherosclerosis at the skull base.  No ventriculomegaly. No midline shift, mass effect, or evidence of mass lesion.  No acute intracranial hemorrhage identified.  No suspicious intracranial vascular hyperdensity.  Dominant distal left vertebral artery.  Patchy confluent cerebral white matter hypodensity.  Suggestion of eight indeterminate small vessel disease in the right thalamus. No evidence of cortically based acute infarction identified.  IMPRESSION: No acute cortically based infarction identified. Suspect small vessel disease.  Original Report Authenticated By: Harley Hallmark, M.D.   Dg Chest Port 1 View  10/19/2011  *RADIOLOGY REPORT*  Clinical Data: 78 year old male with weakness.  PORTABLE CHEST - 1 VIEW  Comparison: 04/20/2011.  Findings: Portable  semi upright AP views of the chest at 1443 hours.  Stable somewhat large lung volumes.  Cardiac size and mediastinal contours are within normal limits.  No pneumothorax, pulmonary edema, pleural effusion or confluent pulmonary opacity. Stable visualized osseous structures.  IMPRESSION: No acute cardiopulmonary abnormality.  Original Report Authenticated By: Harley Hallmark, M.D.    Assessment/Plan Present on Admission:  .UTI (lower urinary tract infection) Weakness Admit to MedSurg  Antibiotics started Will ask PT to see patient Diabetes mellitus Hypertension Dyslipidemia Prostate cancer Hypothyroidism All stable  DVT prophylaxis Team 5/Dr. Tedra Senegal, Jacalyn Biggs 10/20/2011, 1:32 AM

## 2011-10-20 NOTE — ED Notes (Signed)
Crosley, MD at bedside. 

## 2011-10-21 DIAGNOSIS — D649 Anemia, unspecified: Secondary | ICD-10-CM

## 2011-10-21 DIAGNOSIS — E876 Hypokalemia: Secondary | ICD-10-CM | POA: Diagnosis not present

## 2011-10-21 LAB — BASIC METABOLIC PANEL
CO2: 29 mEq/L (ref 19–32)
Calcium: 8.7 mg/dL (ref 8.4–10.5)
Creatinine, Ser: 0.64 mg/dL (ref 0.50–1.35)
GFR calc non Af Amer: >90 mL/min (ref >90)
Glucose, Bld: 101 mg/dL — ABNORMAL HIGH (ref 70–99)

## 2011-10-21 LAB — CBC
MCH: 33.8 pg (ref 26.0–34.0)
MCV: 98.3 fL (ref 78.0–100.0)
Platelets: 250 10*3/uL (ref 150–400)
RDW: 13 % (ref 11.5–15.5)
WBC: 6.1 10*3/uL (ref 4.0–10.5)

## 2011-10-21 LAB — GLUCOSE, CAPILLARY
Glucose-Capillary: 184 mg/dL — ABNORMAL HIGH (ref 70–99)
Glucose-Capillary: 173 mg/dL — ABNORMAL HIGH (ref 70–99)
Glucose-Capillary: 81 mg/dL (ref 70–99)

## 2011-10-21 LAB — LIPID PANEL: Cholesterol: 153 mg/dL (ref 0–200)

## 2011-10-21 MED ORDER — FERROUS SULFATE 325 (65 FE) MG PO TABS
325.0000 mg | ORAL_TABLET | Freq: Every day | ORAL | Status: DC
  Administered 2011-10-22 – 2011-10-24 (×3): 325 mg via ORAL
  Filled 2011-10-21 (×3): qty 1

## 2011-10-21 MED ORDER — FERROUS SULFATE 325 (65 FE) MG PO TABS
325.0000 mg | ORAL_TABLET | Freq: Three times a day (TID) | ORAL | Status: DC
  Filled 2011-10-21 (×2): qty 1

## 2011-10-21 MED ORDER — POTASSIUM CHLORIDE CRYS ER 20 MEQ PO TBCR
40.0000 meq | EXTENDED_RELEASE_TABLET | ORAL | Status: AC
  Administered 2011-10-21 (×2): 40 meq via ORAL
  Filled 2011-10-21 (×3): qty 2

## 2011-10-21 MED ORDER — ENOXAPARIN SODIUM 40 MG/0.4ML ~~LOC~~ SOLN
40.0000 mg | SUBCUTANEOUS | Status: DC
  Administered 2011-10-22 – 2011-10-24 (×3): 40 mg via SUBCUTANEOUS
  Filled 2011-10-21 (×3): qty 0.4

## 2011-10-21 MED ORDER — INSULIN ASPART PROT & ASPART (70-30 MIX) 100 UNIT/ML ~~LOC~~ SUSP
18.0000 [IU] | Freq: Every day | SUBCUTANEOUS | Status: DC
  Administered 2011-10-22 – 2011-10-24 (×3): 18 [IU] via SUBCUTANEOUS

## 2011-10-21 NOTE — Progress Notes (Signed)
Subjective: Patient states he feels better. No complaints.  Objective: Vital signs in last 24 hours: Filed Vitals:   10/20/11 0730 10/20/11 1807 10/20/11 2207 10/21/11 0356  BP: 140/69 113/64 103/54 102/47  Pulse: 64 71 64 65  Temp:  97.6 F (36.4 C) 98.2 F (36.8 C) 98.4 F (36.9 C)  TempSrc:  Oral Oral Oral  Resp: 11 16 15 16   Height:  6' (1.829 m)    Weight:  62.596 kg (138 lb)    SpO2: 98% 98% 95% 98%    Intake/Output Summary (Last 24 hours) at 10/21/11 0946 Last data filed at 10/20/11 2037  Gross per 24 hour  Intake      0 ml  Output    100 ml  Net   -100 ml    Weight change:   General: Alert, awake, oriented x3, in no acute distress. Heart: Regular rate and rhythm, without murmurs, rubs, gallops. Lungs: Clear to auscultation bilaterally. Abdomen: Soft, nontender, nondistended, positive bowel sounds. Extremities: No clubbing cyanosis or edema with positive pedal pulses.    Lab Results:  Basename 10/21/11 0340 10/20/11 0500  NA 135 135  K 3.2* 4.0  CL 99 97  CO2 29 29  GLUCOSE 101* 327*  BUN 13 17  CREATININE 0.64 0.74  CALCIUM 8.7 9.4  MG 1.7 --  PHOS -- --   No results found for this basename: AST:2,ALT:2,ALKPHOS:2,BILITOT:2,PROT:2,ALBUMIN:2 in the last 72 hours No results found for this basename: LIPASE:2,AMYLASE:2 in the last 72 hours  Basename 10/21/11 0340 10/20/11 0500 10/19/11 1510  WBC 6.1 8.3 --  NEUTROABS -- -- 5.5  HGB 10.0* 10.6* --  HCT 29.1* 32.3* --  MCV 98.3 99.4 --  PLT 250 258 --    Basename 10/19/11 1510  CKTOTAL 54  CKMB --  CKMBINDEX --  TROPONINI --   No components found with this basename: POCBNP:3 No results found for this basename: DDIMER:2 in the last 72 hours  Basename 10/20/11 0950  HGBA1C 10.0*    Basename 10/21/11 0340  CHOL 153  HDL 65  LDLCALC 70  TRIG 92  CHOLHDL 2.4  LDLDIRECT --    Basename 10/20/11 0950  TSH 5.757*  T4TOTAL --  T3FREE --  THYROIDAB --    Basename 10/20/11 0950    VITAMINB12 1550*  FOLATE 15.2  FERRITIN 214  TIBC 229  IRON 64  RETICCTPCT --    Micro Results: Recent Results (from the past 240 hour(s))  URINE CULTURE     Status: Normal (Preliminary result)   Collection Time   10/19/11  6:15 PM      Component Value Range Status Comment   Specimen Description URINE, RANDOM   Final    Special Requests NONE   Final    Culture  Setup Time [id number]   Final    Colony Count 50,000 COLONIES/ML   Final    Culture     Final    Value: STAPHYLOCOCCUS SPECIES     Note: RIFAMPIN AND GENTAMICIN SHOULD NOT BE USED AS SINGLE DRUGS FOR TREATMENT OF STAPH INFECTIONS.   Report Status PENDING   Incomplete     Studies/Results: Ct Head Wo Contrast  10/19/2011  *RADIOLOGY REPORT*  Clinical Data: 78 year old male with weakness.  CT HEAD WITHOUT CONTRAST  Technique:  Contiguous axial images were obtained from the base of the skull through the vertex without contrast.  Comparison: None.  Findings: Visualized paranasal sinuses and mastoids are clear.  Are pass bones no acute orbit or  scalp soft tissue findings.  Mild Calcified atherosclerosis at the skull base.  No ventriculomegaly. No midline shift, mass effect, or evidence of mass lesion.  No acute intracranial hemorrhage identified.  No suspicious intracranial vascular hyperdensity.  Dominant distal left vertebral artery.  Patchy confluent cerebral white matter hypodensity.  Suggestion of eight indeterminate small vessel disease in the right thalamus. No evidence of cortically based acute infarction identified.  IMPRESSION: No acute cortically based infarction identified. Suspect small vessel disease.  Original Report Authenticated By: Harley Hallmark, M.D.   Dg Chest Port 1 View  10/19/2011  *RADIOLOGY REPORT*  Clinical Data: 78 year old male with weakness.  PORTABLE CHEST - 1 VIEW  Comparison: 04/20/2011.  Findings: Portable semi upright AP views of the chest at 1443 hours.  Stable somewhat large lung volumes.  Cardiac  size and mediastinal contours are within normal limits.  No pneumothorax, pulmonary edema, pleural effusion or confluent pulmonary opacity. Stable visualized osseous structures.  IMPRESSION: No acute cardiopulmonary abnormality.  Original Report Authenticated By: Harley Hallmark, M.D.    Medications:     . cefTRIAXone (ROCEPHIN)  IV  1 g Intravenous Q24H  . enoxaparin  40 mg Subcutaneous Q24H  . famotidine  20 mg Oral BID  . ferrous sulfate  325 mg Oral TID WC  . insulin aspart      . insulin aspart  0-15 Units Subcutaneous TID WC  . insulin aspart protamine-insulin aspart  18 Units Subcutaneous Q supper  . insulin aspart protamine-insulin aspart  18 Units Subcutaneous Q breakfast  . levothyroxine  125 mcg Oral QAC breakfast  . potassium chloride  40 mEq Oral Q4H  . simvastatin  20 mg Oral QPM  . Tamsulosin HCl  0.4 mg Oral Daily  . DISCONTD: enoxaparin  30 mg Subcutaneous Q24H  . DISCONTD: ferrous sulfate  325 mg Oral Q breakfast  . DISCONTD: insulin aspart protamine-insulin aspart  15 Units Subcutaneous Q breakfast    Assessment: Principal Problem:  *UTI (lower urinary tract infection) Active Problems:  Weakness  Hypertension  Diabetes mellitus  Prostate cancer  Hypothyroidism  Hypokalemia  Anemia   Plan: #1 urinary tract infection Afebrile. Urine cultures are pending. Continue empiric IV Rocephin. #2 generalized weakness Likely multifactorial secondary to problem #1, dehydration and probable debilitated. CT head is negative. Chest x-ray is negative. Continue IV fluids, IV antibiotics, PT OT. #3 hypertension On Flomax. #4 uncontrolled diabetes mellitus Hemoglobin A1c was 10. CBGs every from 101 through 308. Will change patient's NPH 70/ 30 to 18 units twice a day. Sliding scale insulin. Consult with diabetes coordinator for diabetes education. #5 hypokalemia Replete #6  anemia of chronic disease No overt GI bleed. Continue iron supplementation. Follow H&H. #7  hypothyroidism TSH of 5.757. Continue Synthroid. Followup as outpatient. #8 prophylaxis Pepcid for GI, Lovenox for DVT.   LOS: 2 days   [facility] 10/21/2011, 9:46 AM

## 2011-10-22 LAB — BASIC METABOLIC PANEL
BUN: 9 mg/dL (ref 6–23)
Calcium: 8.7 mg/dL (ref 8.4–10.5)
Creatinine, Ser: 0.67 mg/dL (ref 0.50–1.35)
GFR calc Af Amer: >90 mL/min (ref >90)
GFR calc non Af Amer: 88 mL/min — ABNORMAL LOW (ref >90)

## 2011-10-22 LAB — URINE CULTURE: Culture  Setup Time: 201302150123

## 2011-10-22 LAB — GLUCOSE, CAPILLARY
Glucose-Capillary: 157 mg/dL — ABNORMAL HIGH (ref 70–99)
Glucose-Capillary: 180 mg/dL — ABNORMAL HIGH (ref 70–99)

## 2011-10-22 LAB — CBC
MCHC: 32.9 g/dL (ref 30.0–36.0)
Platelets: 242 10*3/uL (ref 150–400)
RDW: 13 % (ref 11.5–15.5)

## 2011-10-22 MED ORDER — INSULIN ASPART PROT & ASPART (70-30 MIX) 100 UNIT/ML ~~LOC~~ SUSP
15.0000 [IU] | Freq: Every day | SUBCUTANEOUS | Status: DC
  Administered 2011-10-23: 15 [IU] via SUBCUTANEOUS

## 2011-10-22 MED ORDER — SODIUM CHLORIDE 0.9 % IV BOLUS (SEPSIS)
500.0000 mL | Freq: Once | INTRAVENOUS | Status: AC
  Administered 2011-10-22: 500 mL via INTRAVENOUS

## 2011-10-22 NOTE — Progress Notes (Signed)
Subjective: Patient states he feels a lot better. No complaints. Denies any dizziness. Objective: Vital signs in last 24 hours: Filed Vitals:   10/21/11 1350 10/21/11 2200 10/22/11 0600 10/22/11 1357  BP: 146/86 137/66 108/62 93/56  Pulse: 76 70 71 77  Temp: 98.1 F (36.7 C) 98 F (36.7 C) 97.9 F (36.6 C) 98 F (36.7 C)  TempSrc: Oral Oral Oral Oral  Resp: 18 16 16 16   Height:      Weight:      SpO2: 100% 99% 97% 99%    Intake/Output Summary (Last 24 hours) at 10/22/11 1719 Last data filed at 10/22/11 1700  Gross per 24 hour  Intake   2640 ml  Output    650 ml  Net   1990 ml    Weight change:   General: Alert, awake, oriented x3, in no acute distress. Heart: Regular rate and rhythm, without murmurs, rubs, gallops. Lungs: Clear to auscultation bilaterally. Abdomen: Soft, nontender, nondistended, positive bowel sounds. Extremities: No clubbing cyanosis or edema with positive pedal pulses.    Lab Results:  Basename 10/22/11 0402 10/21/11 0340  NA 135 135  K 3.8 3.2*  CL 102 99  CO2 28 29  GLUCOSE 48* 101*  BUN 9 13  CREATININE 0.67 0.64  CALCIUM 8.7 8.7  MG -- 1.7  PHOS -- --   No results found for this basename: AST:2,ALT:2,ALKPHOS:2,BILITOT:2,PROT:2,ALBUMIN:2 in the last 72 hours No results found for this basename: LIPASE:2,AMYLASE:2 in the last 72 hours  Basename 10/22/11 0402 10/21/11 0340  WBC 6.8 6.1  NEUTROABS -- --  HGB 9.6* 10.0*  HCT 29.2* 29.1*  MCV 99.3 98.3  PLT 242 250   No results found for this basename: CKTOTAL:3,CKMB:3,CKMBINDEX:3,TROPONINI:3 in the last 72 hours No components found with this basename: POCBNP:3 No results found for this basename: DDIMER:2 in the last 72 hours  Basename 10/20/11 0950  HGBA1C 10.0*    Basename 10/21/11 0340  CHOL 153  HDL 65  LDLCALC 70  TRIG 92  CHOLHDL 2.4  LDLDIRECT --    Basename 10/20/11 0950  TSH 5.757*  T4TOTAL --  T3FREE --  THYROIDAB --    Basename 10/20/11 0950  VITAMINB12  1550*  FOLATE 15.2  FERRITIN 214  TIBC 229  IRON 64  RETICCTPCT --    Micro Results: Recent Results (from the past 240 hour(s))  URINE CULTURE     Status: Normal   Collection Time   10/19/11  6:15 PM      Component Value Range Status Comment   Specimen Description URINE, RANDOM   Final    Special Requests NONE   Final    Culture  Setup Time [id number]   Final    Colony Count 50,000 COLONIES/ML   Final    Culture     Final    Value: STAPHYLOCOCCUS SPECIES (COAGULASE NEGATIVE)     Note: RIFAMPIN AND GENTAMICIN SHOULD NOT BE USED AS SINGLE DRUGS FOR TREATMENT OF STAPH INFECTIONS.   Report Status 10/22/2011 FINAL   Final    Organism ID, Bacteria STAPHYLOCOCCUS SPECIES (COAGULASE NEGATIVE)   Final     Studies/Results: No results found.  Medications:     . cefTRIAXone (ROCEPHIN)  IV  1 g Intravenous Q24H  . enoxaparin  40 mg Subcutaneous Q24H  . famotidine  20 mg Oral BID  . ferrous sulfate  325 mg Oral Q breakfast  . insulin aspart  0-15 Units Subcutaneous TID WC  . insulin aspart protamine-insulin aspart  18 Units Subcutaneous Q supper  . insulin aspart protamine-insulin aspart  18 Units Subcutaneous Q breakfast  . levothyroxine  125 mcg Oral QAC breakfast  . simvastatin  20 mg Oral QPM  . sodium chloride  500 mL Intravenous Once  . Tamsulosin HCl  0.4 mg Oral Daily    Assessment: Principal Problem:  *UTI (lower urinary tract infection) Active Problems:  Weakness  Hypertension  Diabetes mellitus  Prostate cancer  Hypothyroidism  Hypokalemia  Anemia   Plan: #1 urinary tract infection Afebrile. Urine cultures with coag negative staph. Continue empiric IV Rocephin D3. #2 generalized weakness Likely multifactorial secondary to problem #1, dehydration and probable debilitated. CT head is negative. Chest x-ray is negative. Continue IV fluids, IV antibiotics, PT/ OT. wILL NEED hhpt #3 hypertension On Flomax. wILL GIVE A BOLUS AND CONTINUE TO HOLD LASIX. #4  uncontrolled diabetes mellitus Hemoglobin A1c was 10. CBGs every from 66 through 157. Will change patient's NPH 70/ 30 to 18 units IN MORNING AND 15 UNITS AT NIGHT.  Sliding scale insulin. Consult with diabetes coordinator for diabetes education. #5 hypokalemia Repleted #6  anemia of chronic disease No overt GI bleed. Continue iron supplementation. Follow H&H. #7 hypothyroidism TSH of 5.757. Continue Synthroid. Followup as outpatient. #8 prophylaxis Pepcid for GI, Lovenox for DVT.   LOS: 3 days   [facility] 10/22/2011, 5:19 PM

## 2011-10-22 NOTE — Evaluation (Signed)
Physical Therapy Evaluation Patient Details Name: [name] MRN: [id number] DOB: October 12, 1928 Today's Date: 10/22/2011  Problem List:  Patient Active Problem List  Diagnoses  . UTI (lower urinary tract infection)  . Weakness  . Hypertension  . Diabetes mellitus  . Prostate cancer  . Hypothyroidism  . Hypokalemia  . Anemia    Past Medical History:  Past Medical History  Diagnosis Date  . Diabetes mellitus   . Cancer   . Thyroid disease   . Anemia   . Hyperlipidemia    Past Surgical History: History reviewed. No pertinent past surgical history.  PT Assessment/Plan/Recommendation PT Assessment Clinical Impression Statement: Pateint admitted with UTI and weakness presents close to functional baseline, but has been falling at home and admits to little physical activity.  Will benefit from skilled PT in acute setting to maximize safety and independence for d/c home with wife and HHPT. PT Recommendation/Assessment: Patient will need skilled PT in the acute care venue PT Problem List: Decreased activity tolerance;Decreased balance;Decreased mobility PT Therapy Diagnosis : Generalized weakness;Abnormality of gait PT Plan PT Frequency: Min 3X/week PT Treatment/Interventions: Gait training;Stair training;DME instruction;Functional mobility training;Therapeutic activities;Therapeutic exercise;Balance training PT Recommendation Follow Up Recommendations: Home health PT Equipment Recommended: None recommended by PT PT Goals  Acute Rehab PT Goals PT Goal Formulation: With patient Pt will go Sit to Stand: with modified independence PT Goal: Sit to Stand - Progress: Goal set today Pt will Ambulate: with modified independence;with rolling walker;51 - 150 feet PT Goal: Ambulate - Progress: Goal set today Pt will Go Up / Down Stairs: 3-5 stairs;with rail(s);with supervision PT Goal: Up/Down Stairs - Progress: Goal set today Pt will Perform Home Exercise Program: Independently PT Goal:  Perform Home Exercise Program - Progress: Goal set today  PT Evaluation Precautions/Restrictions  Precautions Precautions: Fall Precaution Comments: has had approx 6 falls in past 6 months  Prior Functioning  Home Living Lives With: Spouse Type of Home: House Home Layout: One level Home Access: Stairs to enter Entrance Stairs-Rails: Can reach both;Right;Left Entrance Stairs-Number of Steps: 4 Bathroom Shower/Tub: Tub/shower unit Home Adaptive Equipment: Bedside commode/3-in-1;Shower chair with back Prior Function Level of Independence: Requires assistive device for independence;Independent with gait;Independent with transfers;Independent with basic ADLs Comments: uses momentum strategy for transfers at times Cognition Cognition Arousal/Alertness: Awake/alert Sensation/Coordination Sensation Light Touch: Appears Intact Extremity Assessment RLE Assessment RLE Assessment: Within Functional Limits LLE Assessment LLE Assessment: Within Functional Limits Mobility (including Balance) Transfers Transfers: Yes Sit to Stand: 5: Supervision;With upper extremity assist;From chair/3-in-1 Sit to Stand Details (indicate cue type and reason): for safety Stand to Sit: 5: Supervision;With armrests;To chair/3-in-1 Stand to Sit Details: for safety Ambulation/Gait Ambulation/Gait: Yes Ambulation/Gait Assistance: 5: Supervision Ambulation/Gait Assistance Details (indicate cue type and reason): heavy reliance on UE's on walker and forward flexed, c/o fatigue on the way back Ambulation Distance (Feet): 120 Feet Assistive device: Rolling walker Gait Pattern: Shuffle    Exercise    End of Session PT - End of Session Equipment Utilized During Treatment: Gait belt Activity Tolerance: Patient limited by fatigue Patient left: in chair;with call bell in reach Nurse Communication: Mobility status for ambulation (and to offer hallway ambulation 2x/day walker in room) General Behavior During  Session: [facility] for tasks performed Cognition: [facility] for tasks performed  [facility] 10/22/2011, 1:14 PM

## 2011-10-23 LAB — CBC
Platelets: 234 10*3/uL (ref 150–400)
RDW: 13.3 % (ref 11.5–15.5)
WBC: 5.9 10*3/uL (ref 4.0–10.5)

## 2011-10-23 LAB — GLUCOSE, CAPILLARY: Glucose-Capillary: 336 mg/dL — ABNORMAL HIGH (ref 70–99)

## 2011-10-23 LAB — BASIC METABOLIC PANEL
Chloride: 100 mEq/L (ref 96–112)
GFR calc Af Amer: >90 mL/min (ref >90)
Potassium: 3.9 mEq/L (ref 3.5–5.1)
Sodium: 132 mEq/L — ABNORMAL LOW (ref 135–145)

## 2011-10-23 MED ORDER — SODIUM CHLORIDE 0.9 % IV BOLUS (SEPSIS)
500.0000 mL | Freq: Once | INTRAVENOUS | Status: AC
  Administered 2011-10-23: 500 mL via INTRAVENOUS

## 2011-10-23 NOTE — Clinical Documentation Improvement (Signed)
MALNUTRITION DOCUMENTATION CLARIFICATION  THIS DOCUMENT IS NOT A PERMANENT PART OF THE MEDICAL RECORD  TO RESPOND TO THE THIS QUERY, FOLLOW THE INSTRUCTIONS BELOW:  1. If needed, update documentation for the patient's encounter via the notes activity.  2. Access this query again and click edit on the In [employer].  3. After updating, or not, click F2 to complete all highlighted (required) fields concerning your review. Select "additional documentation in the medical record" OR "no additional documentation provided".  4. Click Sign note button.  5. The deficiency will fall out of your In Basket *Please let us know if you are not able to complete this workflow by phone or e-mail (listed below).  Please update your documentation within the medical record to reflect your response to this query.                                                                                        10/23/11   Dear Dr. Janee Morn, D / Associates,  In a better effort to capture your patient's severity of illness, reflect appropriate length of stay and utilization of resources, a review of the patient medical record has revealed the following indicators.    Based on your clinical judgment, please clarify and document in a progress note and/or discharge summary the clinical condition associated with the following supporting information:  In responding to this query please exercise your independent judgment.  The fact that a query is asked, does not imply  that any particular answer is desired or expected.   Pt admitted with weakness, UTI, DM.  Pt's BMI=   18.8.  Please clarify whether or not BMI can be linked to one of he diagnoses listed below and document in pn  and d/c. Thank You!  [facility]: When linking BMI to a diagnosis please document both BMI and diagnosis together in pn for accuracy of SOI and ROM.   Possible Clinical Conditions?   _______Severe Malnutrition   _______Protein Calorie  Malnutrition _______Severe Protein Calorie Malnutrition _______Emaciation  _______Cachexia   _______Other Condition________________ _______Cannot clinically determine     Supporting Information:  Risk Factors: weakness, UTI, DM  Signs & Symptoms: BMI-18.8 6'/138lbs   Treatment  monitoring Carb modified CBG monitoring      You may use possible, probable, or suspect with inpatient documentation. possible, probable, suspected diagnoses MUST be documented at the time of discharge  Reviewed:  no additional documentation provided ljh  Thank You,  Enis Slipper  RN, BSN, CCDS Clinical Documentation Specialist [facility] Dept Pager: [phone] / E-mail: [email or web address]  Health Information Management [place]

## 2011-10-23 NOTE — Progress Notes (Signed)
Subjective: Patient states he feels a lot better.No complaints. Patient denies any dizziness.  Objective: Vital signs in last 24 hours: Filed Vitals:   10/22/11 1357 10/22/11 2043 10/23/11 0500 10/23/11 1403  BP: 93/56 139/69 128/64 95/52  Pulse: 77 69 64 77  Temp: 98 F (36.7 C) 97.4 F (36.3 C) 98.6 F (37 C) 97.8 F (36.6 C)  TempSrc: Oral Oral Oral Oral  Resp: 16 16 16 18   Height:      Weight:      SpO2: 99% 98% 97% 98%    Intake/Output Summary (Last 24 hours) at 10/23/11 1522 Last data filed at 10/23/11 1349  Gross per 24 hour  Intake   2475 ml  Output    500 ml  Net   1975 ml    Weight change:   General: Alert, awake, oriented x3, in no acute distress. Heart: Regular rate and rhythm, without murmurs, rubs, gallops. Lungs: Clear to auscultation bilaterally. Abdomen: Soft, nontender, nondistended, positive bowel sounds. Extremities: No clubbing cyanosis or edema with positive pedal pulses.    Lab Results:  Basename 10/23/11 0346 10/22/11 0402 10/21/11 0340  NA 132* 135 --  K 3.9 3.8 --  CL 100 102 --  CO2 27 28 --  GLUCOSE 199* 48* --  BUN 9 9 --  CREATININE 0.69 0.67 --  CALCIUM 8.3* 8.7 --  MG -- -- 1.7  PHOS -- -- --   No results found for this basename: AST:2,ALT:2,ALKPHOS:2,BILITOT:2,PROT:2,ALBUMIN:2 in the last 72 hours No results found for this basename: LIPASE:2,AMYLASE:2 in the last 72 hours  Basename 10/23/11 0346 10/22/11 0402  WBC 5.9 6.8  NEUTROABS -- --  HGB 8.9* 9.6*  HCT 27.3* 29.2*  MCV 99.6 99.3  PLT 234 242   No results found for this basename: CKTOTAL:3,CKMB:3,CKMBINDEX:3,TROPONINI:3 in the last 72 hours No components found with this basename: POCBNP:3 No results found for this basename: DDIMER:2 in the last 72 hours No results found for this basename: HGBA1C:2 in the last 72 hours  Basename 10/21/11 0340  CHOL 153  HDL 65  LDLCALC 70  TRIG 92  CHOLHDL 2.4  LDLDIRECT --   No results found for this basename:  TSH,T4TOTAL,FREET3,T3FREE,THYROIDAB in the last 72 hours No results found for this basename: VITAMINB12:2,FOLATE:2,FERRITIN:2,TIBC:2,IRON:2,RETICCTPCT:2 in the last 72 hours  Micro Results: Recent Results (from the past 240 hour(s))  URINE CULTURE     Status: Normal   Collection Time   10/19/11  6:15 PM      Component Value Range Status Comment   Specimen Description URINE, RANDOM   Final    Special Requests NONE   Final    Culture  Setup Time [id number]   Final    Colony Count 50,000 COLONIES/ML   Final    Culture     Final    Value: STAPHYLOCOCCUS SPECIES (COAGULASE NEGATIVE)     Note: RIFAMPIN AND GENTAMICIN SHOULD NOT BE USED AS SINGLE DRUGS FOR TREATMENT OF STAPH INFECTIONS.   Report Status 10/22/2011 FINAL   Final    Organism ID, Bacteria STAPHYLOCOCCUS SPECIES (COAGULASE NEGATIVE)   Final     Studies/Results: No results found.  Medications:     . cefTRIAXone (ROCEPHIN)  IV  1 g Intravenous Q24H  . enoxaparin  40 mg Subcutaneous Q24H  . famotidine  20 mg Oral BID  . ferrous sulfate  325 mg Oral Q breakfast  . insulin aspart  0-15 Units Subcutaneous TID WC  . insulin aspart protamine-insulin aspart  15 Units  Subcutaneous Q supper  . insulin aspart protamine-insulin aspart  18 Units Subcutaneous Q breakfast  . levothyroxine  125 mcg Oral QAC breakfast  . simvastatin  20 mg Oral QPM  . sodium chloride  500 mL Intravenous Once  . DISCONTD: insulin aspart protamine-insulin aspart  18 Units Subcutaneous Q supper  . DISCONTD: Tamsulosin HCl  0.4 mg Oral Daily    Assessment: Principal Problem:  *UTI (lower urinary tract infection) Active Problems:  Weakness  Hypertension  Diabetes mellitus  Prostate cancer  Hypothyroidism  Hypokalemia  Anemia   Plan: #1 urinary tract infection Afebrile. Urine cultures with coag negative staph. Continue empiric IV Rocephin D4. #2 generalized weakness Likely multifactorial secondary to problem #1, dehydration and probable  debilitated. CT head is negative. Chest x-ray is negative. Continue IV fluids, IV antibiotics, PT/ OT. wILL NEED hhpt #3 hypertension  BP borderline and in 90s. Will d/c flomax and follow. Increase IVF to 100cc/h. Continue to hold lasix. #4 uncontrolled diabetes mellitus Hemoglobin A1c was 10. CBGs every from 163 through 336. Will change patient's NPH 70/ 30 to 18 units IN MORNING AND 18 UNITS AT NIGHT.  Sliding scale insulin. Consult with diabetes coordinator for diabetes education. #5 hypokalemia Repleted #6  anemia of chronic disease No overt GI bleed. Continue iron supplementation. Follow H&H. #7 hypothyroidism TSH of 5.757. Continue Synthroid. Followup as outpatient. #8 prophylaxis Pepcid for GI, Lovenox for DVT.   LOS: 4 days   Mallerie Blok 10/23/2011, 3:22 PM

## 2011-10-23 NOTE — Evaluation (Signed)
Occupational Therapy Evaluation Patient Details Name: [name] MRN: [id number] DOB: 1929-01-01 Today's Date: 10/23/2011 EV2 [phone] Problem List:  Patient Active Problem List  Diagnoses  . UTI (lower urinary tract infection)  . Weakness  . Hypertension  . Diabetes mellitus  . Prostate cancer  . Hypothyroidism  . Hypokalemia  . Anemia    Past Medical History:  Past Medical History  Diagnosis Date  . Diabetes mellitus   . Cancer   . Thyroid disease   . Anemia   . Hyperlipidemia    Past Surgical History: History reviewed. No pertinent past surgical history.  OT Assessment/Plan/Recommendation OT Assessment Clinical Impression Statement: Pt is an 78 yo male who presents with a UTI and a decline in BADL performance. Skilled OT recommended to maximize I to supervision/mod I level in prep for safe d/c home with family and HHOT. OT Recommendation/Assessment: Patient will need skilled OT in the acute care venue OT Problem List: Decreased activity tolerance;Decreased safety awareness;Decreased knowledge of use of DME or AE OT Therapy Diagnosis : Generalized weakness OT Plan- HHOT. Pt has all DME OT Frequency: Min 2X/week OT Treatment/Interventions: Self-care/ADL training;Therapeutic activities;Patient/family education;Energy conservation;DME and/or AE instruction Individuals Consulted Consulted and Agree with Results and Recommendations: Patient OT Goals Acute Rehab OT Goals OT Goal Formulation: With patient Time For Goal Achievement: 2 weeks ADL Goals Pt Will Perform Grooming: with modified independence;Standing at sink (X 3 tasks to improve standing activity tolerance.) ADL Goal: Grooming - Progress: Goal set today Pt Will Perform Lower Body Bathing: with supervision;Sit to stand from chair;Sit to stand from bed;with adaptive equipment ADL Goal: Lower Body Bathing - Progress: Goal set today Pt Will Perform Lower Body Dressing: with supervision;Sit to stand from chair;Sit to  stand from bed;with adaptive equipment ADL Goal: Lower Body Dressing - Progress: Goal set today Pt Will Transfer to Toilet: 3-in-1;Ambulation;with modified independence ADL Goal: Toilet Transfer - Progress: Goal set today Pt Will Perform Toileting - Clothing Manipulation: with modified independence;Standing ADL Goal: Toileting - Clothing Manipulation - Progress: Goal set today Pt Will Perform Toileting - Hygiene: with modified independence;Sit to stand from 3-in-1/toilet ADL Goal: Toileting - Hygiene - Progress: Goal set today Additional ADL Goal #1: Pt will verbalize 3 energy conservation strategies for successful ADL completion. ADL Goal: Additional Goal #1 - Progress: Goal set today  OT Evaluation Precautions/Restrictions  Precautions Precautions: Fall Precaution Comments: has had approx 6 falls in past 6 months  Prior Functioning Home Living Lives With: Spouse Receives Help From: Family;Other (Comment) (He and wife live with daughter with mild MR.) Type of Home: House Home Layout: One level Home Access: Stairs to enter Entrance Stairs-Rails: Left;Right;Can reach both Entrance Stairs-Number of Steps: 4 Bathroom Shower/Tub: Engineer, manufacturing systems: Standard Home Adaptive Equipment: Bedside commode/3-in-1;Shower chair with back;Walker - rolling;Walker - standard Prior Function Level of Independence: Requires assistive device for independence;Independent with gait;Independent with basic ADLs;Independent with transfers;Needs assistance with homemaking Meal Prep: Total Light Housekeeping: Total Driving: No Vocation: Retired ADL ADL Grooming: Performed;Wash/dry hands;Other (comment) (Minguard A ) Where Assessed - Grooming: Standing at sink Upper Body Bathing: Simulated;Set up Where Assessed - Upper Body Bathing: Sitting, chair;Unsupported Lower Body Bathing: Simulated;Moderate assistance Lower Body Bathing Details (indicate cue type and reason): Pt states he has  difficulty reaching lower legs and feet. States he does use a long handled sponge. Where Assessed - Lower Body Bathing: Sit to stand from chair Upper Body Dressing: Simulated;Set up Where Assessed - Upper Body Dressing: Sitting, chair;Unsupported Lower Body  Dressing: Performed;Moderate assistance Lower Body Dressing Details (indicate cue type and reason): to don/doff socks. Pt states he usually props his feet up on a stool at home. Where Assessed - Lower Body Dressing: Sitting, chair;Unsupported;Sit to stand from chair Toilet Transfer: Performed;Other (comment) (Minguard A ) Toilet Transfer Method: Ambulating (Pt had 1 LOB while ambulating to the bathroom.) Toilet Transfer Equipment: Raised toilet seat with arms (or 3-in-1 over toilet) Toileting - Clothing Manipulation: Simulated;Other (comment) (Minguard A ) Where Assessed - Toileting Clothing Manipulation: Sit to stand from 3-in-1 or toilet Toileting - Hygiene: Simulated;Other (comment) (Minguard A ) Where Assessed - Toileting Hygiene: Sit to stand from 3-in-1 or toilet Tub/Shower Transfer: Not assessed Tub/Shower Transfer Method: Not assessed Equipment Used: Rolling walker Ambulation Related to ADLs: Pt fatigues very quickly. Required extended rest break after ambulation to bathroom. Max effort and time needed for all functional tasks. ADL Comments: Pt admits to being very sedentary at home and having frequent falls. Vision/Perception    Cognition Cognition Arousal/Alertness: Awake/alert Overall Cognitive Status: Appears within functional limits for tasks assessed Sensation/Coordination   Extremity Assessment RUE Assessment RUE Assessment: Within Functional Limits LUE Assessment LUE Assessment: Within Functional Limits Mobility  Transfers Sit to Stand: Other (comment);From chair/3-in-1;With armrests;With upper extremity assist (Minguard A ) Stand to Sit: With upper extremity assist;To chair/3-in-1;With armrests;Other (comment)  (Minguard A ) Stand to Sit Details: VCs for hand placement. Exercises   End of Session OT - End of Session Activity Tolerance: Patient limited by fatigue Patient left: in chair;with call bell in reach General Behavior During Session: [facility] for tasks performed Cognition: [facility] for tasks performed   Lindsi Bayliss A, OTR/L [phone] 10/23/2011, 9:46 AM

## 2011-10-23 NOTE — Progress Notes (Signed)
Talked to patient about DCP; patient plans to return home at discharge. Talked to patient's spouse also and she wants the patient to go to a SNF short term prior to going home. Talked to patient about possibly going to a nursing facility, patient stated that " I am going to my home and my wife can go somewhere if she wants to". List of [facility] agencies given to the patient and he chose [facility]. Norberta Keens RN with [facility] called for arrangements. Abelino Derrick RN, BSN, MHA

## 2011-10-23 NOTE — Progress Notes (Signed)
Inpatient Diabetes Program Recommendations  AACE/ADA: New Consensus Statement on Inpatient Glycemic Control (2009)  Target Ranges:  Prepandial:   less than 140 mg/dL      Peak postprandial:   less than 180 mg/dL (1-2 hours)      Critically ill patients:  140 - 180 mg/dL   Reason for Visit: MD Consult for diabetes education  Results for [name], [name] (MRN [id number]) as of 10/23/2011 11:57  Ref. Range 04/20/2011 22:23 05/05/2011 03:54 10/20/2011 09:50  Hemoglobin A1C Latest Range: <5.7 % 9.5 (H) 10.4 (H) 10.0 (H)    Note: Assessed home care of diabetes.  Note that Hbg A1C has been sub optimal since at least 04/2012.  Patient 78 years of age, so do not think strict dietary restrictions need to be in place.  However, timing of meals at home need to be consistent since he is taking 70/30 insulin.  Sometimes meal times vary due to lack of appetite, and sometimes vary based on when meal is prepared.  Encouraged patient to eat on a schedule-- and eat something even if his appetite is poor.  Wife does meal preparation.  Sounds as though evening meal time is the most variable-- sometimes meal is served at 4 PM and sometimes 6 PM.  If served at 4, patient states is too close to lunch.  Insulin administration is another issue.  Patient states he prepares & gives own insulin using vial and syringe.  Gets his insulin and syringes from the [place].  Likes the insulin pen.  Not sure if insulin pens are available through the [place].  Given age and elevated A1C, recommend observation of patient's preparation & administration of insulin skills.  However, patient's reading glasses are at home.  If technique cannot be fully assessed here, perhaps Home Health nurse can assess.  Timing of insulin administration could be improved.  Patient usually takes AM dose of 70/30 before breakfast.  However, sometimes patient takes evening injection at supper, 8-9 PM, or not at all if he gets too sleepy.  Discussed with patient that evening dose  of 70/30 insulin is designed to be taken before supper.     Encouraged patient to try to improve on timing of meals and insulin administration.  Question if wife needs help with meal preparation.  Discussed situation with nurse who will try to assess insulin prep if family brings in glasses.  Hopefully Home Health can also reinforce need for consistent timing of meals and insulin.  Thank you.

## 2011-10-23 NOTE — Progress Notes (Signed)
UR CHART REVIEWED; B Reylene Stauder RN, BSN, MHA 

## 2011-10-24 LAB — CBC
HCT: 25.6 % — ABNORMAL LOW (ref 39.0–52.0)
Hemoglobin: 8.6 g/dL — ABNORMAL LOW (ref 13.0–17.0)
MCH: 33.2 pg (ref 26.0–34.0)
MCHC: 33.6 g/dL (ref 30.0–36.0)

## 2011-10-24 LAB — BASIC METABOLIC PANEL
BUN: 9 mg/dL (ref 6–23)
GFR calc non Af Amer: 86 mL/min — ABNORMAL LOW (ref >90)
Glucose, Bld: 235 mg/dL — ABNORMAL HIGH (ref 70–99)
Potassium: 3.9 mEq/L (ref 3.5–5.1)

## 2011-10-24 LAB — GLUCOSE, CAPILLARY: Glucose-Capillary: 299 mg/dL — ABNORMAL HIGH (ref 70–99)

## 2011-10-24 MED ORDER — CEFUROXIME AXETIL 250 MG PO TABS: 250.0000 mg | ORAL_TABLET | Freq: Two times a day (BID) | ORAL | Status: AC

## 2011-10-24 NOTE — Progress Notes (Signed)
Patient being discharged in stable condition; Discharge instructions and script given to pt and his dtr [name]; Pt and dtr verbalized understanding

## 2011-10-24 NOTE — Discharge Summary (Signed)
Discharge Summary  [name] MR#: [id number]  DOB:1928-12-11  Date of Admission: 10/19/2011 Date of Discharge: 10/24/2011  Patient's PCP: [name], MD, MD  Attending Physician:Boe Deans  Consults:     Discharge Diagnoses: UTI (lower urinary tract infection) Present on Admission:  .UTI (lower urinary tract infection) .Weakness .Hypertension .Diabetes mellitus .Prostate cancer .Hypothyroidism   Brief Admitting History and Physical HPI:  This is a 78 year old gentleman, who states his as a home nurse today checked his blood pressure and it was a low [no number documented] and his sugars were high in the 300s. She called 911, who redid vitals and felt similar. He was brought to the ER. In the ER his sugars were treated. He was found to have a UA. Arrangements are underway for discharge, but when they went to ambulate the patient he was weak and unable to walk. The patient does reports a chronic weakness but he states is a little bit worse today. He uses a rolling walker. Here in the ER the patient's blood pressure was adequate. History provided by the patient. Reports no fevers, no chills, no nausea, no vomiting, no altered mental status. For the rest of the admission history and physical please see H&P dictated by Dr. Joneen Roach.  Discharge Medications Medication List  As of 10/24/2011 10:07 AM   START taking these medications         cefUROXime 250 MG tablet   Commonly known as: CEFTIN   Take 1 tablet (250 mg total) by mouth 2 (two) times daily. Take for 2 days.         CONTINUE taking these medications         ferrous sulfate 325 (65 FE) MG tablet      insulin aspart protamine-insulin aspart (70-30) 100 UNIT/ML injection   Commonly known as: NOVOLOG 70/30      levothyroxine 125 MCG tablet   Commonly known as: SYNTHROID, LEVOTHROID      ranitidine 150 MG tablet   Commonly known as: ZANTAC      simvastatin 20 MG tablet   Commonly known as: ZOCOR     vitamin B-12 1000 MCG tablet   Commonly known as: CYANOCOBALAMIN      vitamin C 500 MG tablet   Commonly known as: ASCORBIC ACID         STOP taking these medications         furosemide 20 MG tablet      Tamsulosin HCl 0.4 MG Caps          Where to get your medications    These are the prescriptions that you need to pick up.   You may get these medications from any pharmacy.         cefUROXime 250 MG tablet           Hospital Course: UTI (lower urinary tract infection) Patient was admitted with generalized weakness found to have a urinary tract infection on urinalysis. Patient was placed on a MedSurg bed and started on IV Rocephin. Urine cultures were ordered and patient was followed. Patient was hydrated with IV fluids and monitored. Urine cultures came back with 50,000 guaiac-negative staph. Patient improved clinically with antibiotics and IV fluids and will subsequently be transitioned to oral Ceftin for 2 more days to complete a one-week course of antibiotic therapy. Patient was discharged home in stable and improved condition to followup with PCP as stated above.  Generalized weakness Felt to be secondary to problem #1 of the urinary tract infection,  dehydration and debility. Patient was hydrated with IV fluids place on empiric IV antibiotics urine cultures will obtain and patient was seen by physical therapy. Patient's Lasix was held on admission secondary to borderline blood pressure. During the hospitalization patient had systolic blood pressures in 90s and subsequently his Flomax was discontinued. Patient improved clinically and he'll be discharged home with home health physical therapy. Patient will followup with PCP as outpatient.  Hypertension On admission patient was noted to be on Lasix and Flomax for his hypertension. Patient had borderline blood pressure with systolic blood pressures in the 90s. Patient's Lasix was held on admission and his Flomax was discontinued  due to continued blood pressures in the 90s. Patient's blood pressure improved after discontinuation of his anti-hypertensive medications. Patient be discharged home off of his antihypertensive medications and will need to followup with his PCP one week post discharge at which point in time further decisions in his blood pressure will need to be made.  Anemia During the hospitalization patient was noted to be anemic. Patient did not have any overt GI bleed. Patient's hemoglobin remained stable. Patient will followup with PCP as outpatient for further evaluation and management.  Present on Admission:  .UTI (lower urinary tract infection) .Weakness .Hypertension .Diabetes mellitus .Prostate cancer .Hypothyroidism   Day of Discharge BP 128/64  Pulse 70  Temp(Src) 98 F (36.7 C) (Oral)  Resp 16  Ht 6' (1.829 m)  Wt 62.596 kg (138 lb)  BMI 18.72 kg/m2  SpO2 95% Subjective: No complaints. Patient states he feels very well. General: Alert, awake, oriented x3, in no acute distress. Heart: Regular rate and rhythm, without murmurs, rubs, gallops. Lungs: Clear to auscultation bilaterally. Abdomen: Soft, nontender, nondistended, positive bowel sounds. Extremities: No clubbing cyanosis or edema with positive pedal pulses.   Results for orders placed during the hospital encounter of 10/19/11 (from the past 48 hour(s))  GLUCOSE, CAPILLARY     Status: Abnormal   Collection Time   10/22/11 11:33 AM      Component Value Range Comment   Glucose-Capillary 157 (*) 70 - 99 (mg/dL)   GLUCOSE, CAPILLARY     Status: Abnormal   Collection Time   10/22/11  4:19 PM      Component Value Range Comment   Glucose-Capillary 143 (*) 70 - 99 (mg/dL)    Comment 1 Notify RN     GLUCOSE, CAPILLARY     Status: Abnormal   Collection Time   10/22/11  9:58 PM      Component Value Range Comment   Glucose-Capillary 180 (*) 70 - 99 (mg/dL)    Comment 1 Notify RN     CBC     Status: Abnormal   Collection Time    10/23/11  3:46 AM      Component Value Range Comment   WBC 5.9  4.0 - 10.5 (K/uL)    RBC 2.74 (*) 4.22 - 5.81 (MIL/uL)    Hemoglobin 8.9 (*) 13.0 - 17.0 (g/dL)    HCT 82.9 (*) 56.2 - 52.0 (%)    MCV 99.6  78.0 - 100.0 (fL)    MCH 32.5  26.0 - 34.0 (pg)    MCHC 32.6  30.0 - 36.0 (g/dL)    RDW 13.0  86.5 - 78.4 (%)    Platelets 234  150 - 400 (K/uL)   BASIC METABOLIC PANEL     Status: Abnormal   Collection Time   10/23/11  3:46 AM      Component Value Range  Comment   Sodium 132 (*) 135 - 145 (mEq/L)    Potassium 3.9  3.5 - 5.1 (mEq/L)    Chloride 100  96 - 112 (mEq/L)    CO2 27  19 - 32 (mEq/L)    Glucose, Bld 199 (*) 70 - 99 (mg/dL)    BUN 9  6 - 23 (mg/dL)    Creatinine, Ser 0.86  0.50 - 1.35 (mg/dL)    Calcium 8.3 (*) 8.4 - 10.5 (mg/dL)    GFR calc non Af Amer 87 (*) >90 (mL/min)    GFR calc Af Amer >90  >90 (mL/min)   GLUCOSE, CAPILLARY     Status: Abnormal   Collection Time   10/23/11  7:27 AM      Component Value Range Comment   Glucose-Capillary 336 (*) 70 - 99 (mg/dL)    Comment 1 Documented in Chart      Comment 2 Notify RN     GLUCOSE, CAPILLARY     Status: Abnormal   Collection Time   10/23/11 12:28 PM      Component Value Range Comment   Glucose-Capillary 163 (*) 70 - 99 (mg/dL)    Comment 1 Documented in Chart      Comment 2 Notify RN     GLUCOSE, CAPILLARY     Status: Abnormal   Collection Time   10/23/11  5:00 PM      Component Value Range Comment   Glucose-Capillary 198 (*) 70 - 99 (mg/dL)    Comment 1 Documented in Chart      Comment 2 Notify RN     GLUCOSE, CAPILLARY     Status: Abnormal   Collection Time   10/23/11  8:28 PM      Component Value Range Comment   Glucose-Capillary 169 (*) 70 - 99 (mg/dL)    Comment 1 Notify RN     CBC     Status: Abnormal   Collection Time   10/24/11  3:38 AM      Component Value Range Comment   WBC 6.5  4.0 - 10.5 (K/uL)    RBC 2.59 (*) 4.22 - 5.81 (MIL/uL)    Hemoglobin 8.6 (*) 13.0 - 17.0 (g/dL)    HCT 57.8 (*)  46.9 - 52.0 (%)    MCV 98.8  78.0 - 100.0 (fL)    MCH 33.2  26.0 - 34.0 (pg)    MCHC 33.6  30.0 - 36.0 (g/dL)    RDW 62.9  52.8 - 41.3 (%)    Platelets 225  150 - 400 (K/uL)   BASIC METABOLIC PANEL     Status: Abnormal   Collection Time   10/24/11  3:38 AM      Component Value Range Comment   Sodium 130 (*) 135 - 145 (mEq/L)    Potassium 3.9  3.5 - 5.1 (mEq/L)    Chloride 100  96 - 112 (mEq/L)    CO2 25  19 - 32 (mEq/L)    Glucose, Bld 235 (*) 70 - 99 (mg/dL)    BUN 9  6 - 23 (mg/dL)    Creatinine, Ser 2.44  0.50 - 1.35 (mg/dL)    Calcium 8.0 (*) 8.4 - 10.5 (mg/dL)    GFR calc non Af Amer 86 (*) >90 (mL/min)    GFR calc Af Amer >90  >90 (mL/min)   GLUCOSE, CAPILLARY     Status: Abnormal   Collection Time   10/24/11  7:44 AM      Component  Value Range Comment   Glucose-Capillary 299 (*) 70 - 99 (mg/dL)    Comment 1 Documented in Chart      Comment 2 Notify RN       Ct Head Wo Contrast  10/19/2011  *RADIOLOGY REPORT*  Clinical Data: 78 year old male with weakness.  CT HEAD WITHOUT CONTRAST  Technique:  Contiguous axial images were obtained from the base of the skull through the vertex without contrast.  Comparison: None.  Findings: Visualized paranasal sinuses and mastoids are clear.  Are pass bones no acute orbit or scalp soft tissue findings.  Mild Calcified atherosclerosis at the skull base.  No ventriculomegaly. No midline shift, mass effect, or evidence of mass lesion.  No acute intracranial hemorrhage identified.  No suspicious intracranial vascular hyperdensity.  Dominant distal left vertebral artery.  Patchy confluent cerebral white matter hypodensity.  Suggestion of eight indeterminate small vessel disease in the right thalamus. No evidence of cortically based acute infarction identified.  IMPRESSION: No acute cortically based infarction identified. Suspect small vessel disease.  Original Report Authenticated By: Harley Hallmark, M.D.   Dg Chest Port 1 View  10/19/2011   *RADIOLOGY REPORT*  Clinical Data: 78 year old male with weakness.  PORTABLE CHEST - 1 VIEW  Comparison: 04/20/2011.  Findings: Portable semi upright AP views of the chest at 1443 hours.  Stable somewhat large lung volumes.  Cardiac size and mediastinal contours are within normal limits.  No pneumothorax, pulmonary edema, pleural effusion or confluent pulmonary opacity. Stable visualized osseous structures.  IMPRESSION: No acute cardiopulmonary abnormality.  Original Report Authenticated By: Harley Hallmark, M.D.     Disposition: Home  Diet: Carb modified diet  Activity: Increase activity slowly   Follow-up Appts: Discharge Orders    Future Orders Please Complete By Expires   Diet Carb Modified      Increase activity slowly      Discharge instructions      Comments:   Follow up with HENDERSON,WENDY, MD, in 1 week       TESTS THAT NEED FOLLOW-UP CBC,  BMET  Time spent on discharge, talking to the patient, and coordinating care: 45 mins.   Signed: Royale Lennartz 10/24/2011, 10:07 AM

## 2011-11-21 ENCOUNTER — Emergency Department (HOSPITAL_COMMUNITY): Payer: Medicare Other

## 2011-11-21 ENCOUNTER — Inpatient Hospital Stay (HOSPITAL_COMMUNITY)
Admission: EM | Admit: 2011-11-21 | Discharge: 2011-11-24 | DRG: 638 | Disposition: A | Discharge status: Home Health | Payer: Medicare Other | Attending: Internal Medicine | Admitting: Internal Medicine

## 2011-11-21 ENCOUNTER — Encounter (HOSPITAL_COMMUNITY): Payer: Self-pay | Admitting: *Deleted

## 2011-11-21 DIAGNOSIS — E039 Hypothyroidism, unspecified: Secondary | ICD-10-CM | POA: Diagnosis present

## 2011-11-21 DIAGNOSIS — D649 Anemia, unspecified: Secondary | ICD-10-CM | POA: Diagnosis present

## 2011-11-21 DIAGNOSIS — R531 Weakness: Secondary | ICD-10-CM | POA: Diagnosis present

## 2011-11-21 DIAGNOSIS — C61 Malignant neoplasm of prostate: Secondary | ICD-10-CM

## 2011-11-21 DIAGNOSIS — L03119 Cellulitis of unspecified part of limb: Secondary | ICD-10-CM | POA: Diagnosis present

## 2011-11-21 DIAGNOSIS — L97809 Non-pressure chronic ulcer of other part of unspecified lower leg with unspecified severity: Secondary | ICD-10-CM | POA: Diagnosis present

## 2011-11-21 DIAGNOSIS — L02419 Cutaneous abscess of limb, unspecified: Secondary | ICD-10-CM | POA: Diagnosis present

## 2011-11-21 DIAGNOSIS — E13622 Other specified diabetes mellitus with other skin ulcer: Secondary | ICD-10-CM | POA: Diagnosis present

## 2011-11-21 DIAGNOSIS — E11622 Type 2 diabetes mellitus with other skin ulcer: Secondary | ICD-10-CM

## 2011-11-21 DIAGNOSIS — E876 Hypokalemia: Secondary | ICD-10-CM | POA: Diagnosis present

## 2011-11-21 DIAGNOSIS — A4901 Methicillin susceptible Staphylococcus aureus infection, unspecified site: Secondary | ICD-10-CM | POA: Diagnosis present

## 2011-11-21 DIAGNOSIS — R739 Hyperglycemia, unspecified: Secondary | ICD-10-CM

## 2011-11-21 DIAGNOSIS — I10 Essential (primary) hypertension: Secondary | ICD-10-CM | POA: Diagnosis present

## 2011-11-21 DIAGNOSIS — E1165 Type 2 diabetes mellitus with hyperglycemia: Principal | ICD-10-CM | POA: Diagnosis present

## 2011-11-21 DIAGNOSIS — X58XXXA Exposure to other specified factors, initial encounter: Secondary | ICD-10-CM | POA: Diagnosis present

## 2011-11-21 DIAGNOSIS — S92919A Unspecified fracture of unspecified toe(s), initial encounter for closed fracture: Secondary | ICD-10-CM | POA: Diagnosis present

## 2011-11-21 DIAGNOSIS — R5381 Other malaise: Secondary | ICD-10-CM | POA: Diagnosis present

## 2011-11-21 DIAGNOSIS — Z8546 Personal history of malignant neoplasm of prostate: Secondary | ICD-10-CM

## 2011-11-21 DIAGNOSIS — IMO0002 Reserved for concepts with insufficient information to code with codable children: Principal | ICD-10-CM | POA: Diagnosis present

## 2011-11-21 DIAGNOSIS — L97909 Non-pressure chronic ulcer of unspecified part of unspecified lower leg with unspecified severity: Secondary | ICD-10-CM | POA: Diagnosis present

## 2011-11-21 LAB — POCT I-STAT, CHEM 8
BUN: 11 mg/dL (ref 6–23)
Creatinine, Ser: 0.7 mg/dL (ref 0.50–1.35)
Glucose, Bld: 363 mg/dL — ABNORMAL HIGH (ref 70–99)
Potassium: 4.7 mEq/L (ref 3.5–5.1)
Sodium: 134 mEq/L — ABNORMAL LOW (ref 135–145)

## 2011-11-21 LAB — CBC
Platelets: 473 10*3/uL — ABNORMAL HIGH (ref 150–400)
RBC: 2.78 MIL/uL — ABNORMAL LOW (ref 4.22–5.81)
RDW: 14.4 % (ref 11.5–15.5)
WBC: 8.7 10*3/uL (ref 4.0–10.5)

## 2011-11-21 LAB — GLUCOSE, CAPILLARY
Glucose-Capillary: 415 mg/dL — ABNORMAL HIGH (ref 70–99)
Glucose-Capillary: 357 mg/dL — ABNORMAL HIGH (ref 70–99)

## 2011-11-21 MED ORDER — SODIUM CHLORIDE 0.9 % IV SOLN
INTRAVENOUS | Status: DC
  Administered 2011-11-21: via INTRAVENOUS
  Filled 2011-11-21 (×3): qty 1000

## 2011-11-21 NOTE — ED Notes (Signed)
Pt in c/o hyperglycemia at 390 today for home health nurse, also ulcers to bilateral legs over last week

## 2011-11-22 ENCOUNTER — Emergency Department (HOSPITAL_COMMUNITY): Payer: Medicare Other

## 2011-11-22 ENCOUNTER — Encounter (HOSPITAL_COMMUNITY): Payer: Self-pay

## 2011-11-22 DIAGNOSIS — M7989 Other specified soft tissue disorders: Secondary | ICD-10-CM

## 2011-11-22 DIAGNOSIS — E13622 Other specified diabetes mellitus with other skin ulcer: Secondary | ICD-10-CM | POA: Diagnosis present

## 2011-11-22 DIAGNOSIS — L03119 Cellulitis of unspecified part of limb: Secondary | ICD-10-CM | POA: Diagnosis present

## 2011-11-22 DIAGNOSIS — E1165 Type 2 diabetes mellitus with hyperglycemia: Secondary | ICD-10-CM | POA: Diagnosis present

## 2011-11-22 LAB — URINALYSIS, ROUTINE W REFLEX MICROSCOPIC
Bilirubin Urine: NEGATIVE
Glucose, UA: >1000 mg/dL — AB
Hgb urine dipstick: NEGATIVE
Specific Gravity, Urine: 1.024 (ref 1.005–1.030)
Urobilinogen, UA: 1 mg/dL (ref 0.0–1.0)

## 2011-11-22 LAB — GLUCOSE, CAPILLARY
Glucose-Capillary: 103 mg/dL — ABNORMAL HIGH (ref 70–99)
Glucose-Capillary: 125 mg/dL — ABNORMAL HIGH (ref 70–99)
Glucose-Capillary: 127 mg/dL — ABNORMAL HIGH (ref 70–99)
Glucose-Capillary: 161 mg/dL — ABNORMAL HIGH (ref 70–99)
Glucose-Capillary: 216 mg/dL — ABNORMAL HIGH (ref 70–99)
Glucose-Capillary: 235 mg/dL — ABNORMAL HIGH (ref 70–99)
Glucose-Capillary: 247 mg/dL — ABNORMAL HIGH (ref 70–99)
Glucose-Capillary: 329 mg/dL — ABNORMAL HIGH (ref 70–99)
Glucose-Capillary: 381 mg/dL — ABNORMAL HIGH (ref 70–99)
Glucose-Capillary: 400 mg/dL — ABNORMAL HIGH (ref 70–99)

## 2011-11-22 LAB — BASIC METABOLIC PANEL
BUN: 9 mg/dL (ref 6–23)
CO2: 32 mEq/L (ref 19–32)
Calcium: 8.5 mg/dL (ref 8.4–10.5)
Chloride: 97 mEq/L (ref 96–112)
Creatinine, Ser: 0.64 mg/dL (ref 0.50–1.35)
Glucose, Bld: 80 mg/dL (ref 70–99)

## 2011-11-22 LAB — COMPREHENSIVE METABOLIC PANEL
ALT: 6 U/L (ref 0–53)
AST: 10 U/L (ref 0–37)
Albumin: 3 g/dL — ABNORMAL LOW (ref 3.5–5.2)
CO2: 30 mEq/L (ref 19–32)
Chloride: 94 mEq/L — ABNORMAL LOW (ref 96–112)
Creatinine, Ser: 0.65 mg/dL (ref 0.50–1.35)
GFR calc non Af Amer: 89 mL/min — ABNORMAL LOW (ref >90)
Sodium: 130 mEq/L — ABNORMAL LOW (ref 135–145)
Total Bilirubin: 0.2 mg/dL — ABNORMAL LOW (ref 0.3–1.2)

## 2011-11-22 LAB — BLOOD GAS, ARTERIAL
Acid-Base Excess: 2.4 mmol/L — ABNORMAL HIGH (ref 0.0–2.0)
Bicarbonate: 26.9 mEq/L — ABNORMAL HIGH (ref 20.0–24.0)
Patient temperature: 37
TCO2: 25.5 mmol/L (ref 0–100)
pCO2 arterial: 44 mmHg (ref 35.0–45.0)
pH, Arterial: 7.402 (ref 7.350–7.450)

## 2011-11-22 MED ORDER — INSULIN GLARGINE 100 UNIT/ML ~~LOC~~ SOLN
30.0000 [IU] | Freq: Every day | SUBCUTANEOUS | Status: DC
  Administered 2011-11-22: 30 [IU] via SUBCUTANEOUS
  Filled 2011-11-22: qty 1

## 2011-11-22 MED ORDER — VANCOMYCIN HCL IN DEXTROSE 1-5 GM/200ML-% IV SOLN
1000.0000 mg | Freq: Once | INTRAVENOUS | Status: AC
  Administered 2011-11-22: 1000 mg via INTRAVENOUS
  Filled 2011-11-22: qty 200

## 2011-11-22 MED ORDER — INSULIN REGULAR HUMAN 100 UNIT/ML IJ SOLN: 10.0000 [IU] | Freq: Once | INTRAMUSCULAR | Status: DC

## 2011-11-22 MED ORDER — INSULIN REGULAR BOLUS VIA INFUSION
0.0000 [IU] | Freq: Three times a day (TID) | INTRAVENOUS | Status: DC
  Filled 2011-11-22: qty 10

## 2011-11-22 MED ORDER — DEXTROSE 50 % IV SOLN
25.0000 mL | INTRAVENOUS | Status: DC | PRN
  Administered 2011-11-22: 14 mL via INTRAVENOUS
  Filled 2011-11-22 (×2): qty 50

## 2011-11-22 MED ORDER — INSULIN ASPART 100 UNIT/ML ~~LOC~~ SOLN
10.0000 [IU] | Freq: Once | SUBCUTANEOUS | Status: DC
  Filled 2011-11-22 (×2): qty 1

## 2011-11-22 MED ORDER — ENOXAPARIN SODIUM 40 MG/0.4ML ~~LOC~~ SOLN
40.0000 mg | SUBCUTANEOUS | Status: DC
  Administered 2011-11-22 – 2011-11-24 (×3): 40 mg via SUBCUTANEOUS
  Filled 2011-11-22 (×4): qty 0.4

## 2011-11-22 MED ORDER — INSULIN ASPART 100 UNIT/ML ~~LOC~~ SOLN
0.0000 [IU] | SUBCUTANEOUS | Status: DC
  Administered 2011-11-22 (×2): 3 [IU] via SUBCUTANEOUS
  Administered 2011-11-23: 2 [IU] via SUBCUTANEOUS
  Administered 2011-11-23 (×2): 3 [IU] via SUBCUTANEOUS
  Filled 2011-11-22: qty 1
  Filled 2011-11-22: qty 3
  Filled 2011-11-22: qty 1

## 2011-11-22 MED ORDER — SODIUM CHLORIDE 0.9 % IV SOLN
INTRAVENOUS | Status: DC
  Administered 2011-11-22 – 2011-11-23 (×2): via INTRAVENOUS
  Filled 2011-11-22 (×3): qty 1000

## 2011-11-22 MED ORDER — SIMVASTATIN 20 MG PO TABS
20.0000 mg | ORAL_TABLET | Freq: Every day | ORAL | Status: DC
  Administered 2011-11-22 – 2011-11-24 (×3): 20 mg via ORAL
  Filled 2011-11-22 (×4): qty 1

## 2011-11-22 MED ORDER — SODIUM CHLORIDE 0.9 % IV SOLN
750.0000 mg | Freq: Two times a day (BID) | INTRAVENOUS | Status: DC
  Administered 2011-11-22 – 2011-11-24 (×4): 750 mg via INTRAVENOUS
  Filled 2011-11-22 (×6): qty 750

## 2011-11-22 MED ORDER — SODIUM CHLORIDE 0.9 % IV SOLN
3.0000 g | Freq: Four times a day (QID) | INTRAVENOUS | Status: DC
  Administered 2011-11-22 – 2011-11-24 (×6): 3 g via INTRAVENOUS
  Filled 2011-11-22 (×12): qty 3

## 2011-11-22 MED ORDER — LEVOTHYROXINE SODIUM 125 MCG PO TABS
125.0000 ug | ORAL_TABLET | Freq: Every day | ORAL | Status: DC
  Administered 2011-11-22 – 2011-11-24 (×3): 125 ug via ORAL
  Filled 2011-11-22 (×4): qty 1

## 2011-11-22 MED ORDER — HYDROCODONE-ACETAMINOPHEN 5-325 MG PO TABS
1.0000 | ORAL_TABLET | ORAL | Status: DC | PRN
  Filled 2011-11-22: qty 2

## 2011-11-22 MED ORDER — FERROUS SULFATE 325 (65 FE) MG PO TABS
325.0000 mg | ORAL_TABLET | Freq: Every day | ORAL | Status: DC
Start: 2011-11-22 — End: 2011-11-24
  Administered 2011-11-22 – 2011-11-24 (×3): 325 mg via ORAL
  Filled 2011-11-22 (×4): qty 1

## 2011-11-22 MED ORDER — SODIUM CHLORIDE 0.9 % IV SOLN
INTRAVENOUS | Status: DC
  Administered 2011-11-22: 8.1 [IU]/h via INTRAVENOUS
  Administered 2011-11-22: 3.4 [IU]/h via INTRAVENOUS
  Filled 2011-11-22: qty 1

## 2011-11-22 MED ORDER — FAMOTIDINE 20 MG PO TABS
20.0000 mg | ORAL_TABLET | Freq: Every day | ORAL | Status: DC
  Administered 2011-11-22 – 2011-11-24 (×3): 20 mg via ORAL
  Filled 2011-11-22 (×4): qty 1

## 2011-11-22 MED ORDER — DEXTROSE-NACL 5-0.45 % IV SOLN
INTRAVENOUS | Status: DC
  Administered 2011-11-22: 01:00:00 via INTRAVENOUS

## 2011-11-22 MED ORDER — SODIUM CHLORIDE 0.9 % IV SOLN
INTRAVENOUS | Status: DC
  Administered 2011-11-22: via INTRAVENOUS
  Filled 2011-11-22 (×3): qty 1000

## 2011-11-22 MED ORDER — VITAMIN B-12 1000 MCG PO TABS
1000.0000 ug | ORAL_TABLET | Freq: Every day | ORAL | Status: DC
  Administered 2011-11-22 – 2011-11-24 (×3): 1000 ug via ORAL
  Filled 2011-11-22 (×4): qty 1

## 2011-11-22 NOTE — Progress Notes (Signed)
I have seen and examined pt, he denies any pain in his l. 5th digit- xrys reviewed with him- no evidence of osteo, acute or subacute comninuted fracture in base of left 5th proximal phalanx noted- will consult ortho, and continue current management plan as per Dr Gonzella Lex, follow wound cultures and further manage accordingly.  Donnalee Curry [facility] [phone]

## 2011-11-22 NOTE — Progress Notes (Signed)
ANTIBIOTIC CONSULT NOTE - INITIAL  Pharmacy Consult for Unasyn/Vancomcyin Indication: Cellulitis/Ulcers of BLE  No Known Allergies  Patient Measurements: Height: 6' 0.05" (183 cm) Weight: 138 lb 0.1 oz (62.6 kg) IBW/kg (Calculated) : 77.71    Vital Signs: Temp: 97.6 F (36.4 C) (03/20 0224) Temp src: Oral (03/20 0224) BP: 156/71 mmHg (03/20 0224) Pulse Rate: 63  (03/20 0224) Intake/Output from previous day:   Intake/Output from this shift:    Labs:  Basename 11/21/11 2341 11/21/11 2325  WBC -- 8.7  HGB 10.5* 9.1*  PLT -- 473*  LABCREA -- --  CREATININE 0.70 0.65   Estimated Creatinine Clearance: 65.2 ml/min (by C-G formula based on Cr of 0.7). No results found for this basename: VANCOTROUGH:2,VANCOPEAK:2,VANCORANDOM:2,GENTTROUGH:2,GENTPEAK:2,GENTRANDOM:2,TOBRATROUGH:2,TOBRAPEAK:2,TOBRARND:2,AMIKACINPEAK:2,AMIKACINTROU:2,AMIKACIN:2, in the last 72 hours   Microbiology: No results found for this or any previous visit (from the past 720 hour(s)).  Medical History: Past Medical History  Diagnosis Date  . Diabetes mellitus   . Cancer   . Thyroid disease   . Anemia   . Hyperlipidemia     Medications:  Scheduled:    . ampicillin-sulbactam (UNASYN) IV  3 g Intravenous Q6H  . enoxaparin  40 mg Subcutaneous Q24H  . famotidine  20 mg Oral Daily  . ferrous sulfate  325 mg Oral Q breakfast  . insulin aspart  0-15 Units Subcutaneous Q4H  . insulin aspart  10 Units Subcutaneous Once  . insulin glargine  30 Units Subcutaneous Daily  . insulin regular  0-10 Units Intravenous TID WC  . levothyroxine  125 mcg Oral QAC breakfast  . simvastatin  20 mg Oral QPC supper  . vancomycin  1,000 mg Intravenous Once  . vitamin B-12  1,000 mcg Oral Daily  . DISCONTD: insulin regular  10 Units Subcutaneous Once   Infusions:    . sodium chloride 125 mL/hr at 11/21/11 2331  . sodium chloride    . sodium chloride    . insulin (NOVOLIN-R) infusion 3.4 Units/hr (11/22/11 0139)  .  DISCONTD: dextrose 5 % and 0.45% NaCl 50 mL/hr at 11/22/11 0048   Assessment: 78 yo male admitted with cellulitis/ulcers of bilateral lower legs.  MD ordering Unasyn and Vancomycin for empiric therapy.  Goal of Therapy:  Vancomycin trough level 10-15 mcg/ml  Plan:   Unasyn 3 Gm IV q6h.  Vancomycin 750mg  IV q12h.  CrCl~60 (N).  F/U SCr/Levels as needed.  [name] R 11/22/2011,4:17 AM

## 2011-11-22 NOTE — H&P (Signed)
PCP:  [name], MD, MD   DOA:  11/21/2011  6:37 PM  Chief Complaint:  Swelling and redness over b/l lower leg with ulcers x 3 weeks   HPI: 78 y/o male with hx of uncontrolled DM ( last A1C of 10 in feb 2013), hypothyroidism, hypertension, hx of prostate ca who follows at [place], presented to the ED with progressive lower extremity erythema and swelling for past 2 3 weeks. He informs having some difficulty walking due to swelling and  pain.Marland Kitchen He has also noticed 3 ulcers in the legs  2 on the left and one on the right). He denies similar leg swellings or ulcers in past, denies fever or chills, denies pus discharge or bleeding from these ulcers. He was being seen by home health RN and getting dressings. He is able to ambulate with a cane and has difficulty doing so for past few  days.  In the ED he was noted to have fsg in 400 and planned to admit for cellulitis and ulcer of the legs and uncontrolled diabetes.   Allergies: No Known Allergies  Prior to Admission medications   Medication Sig Start Date End Date Taking? Authorizing Provider  ferrous sulfate 325 (65 FE) MG tablet Take 325 mg by mouth daily with breakfast.   Yes Historical Provider, MD  insulin aspart protamine-insulin aspart (NOVOLOG 70/30) (70-30) 100 UNIT/ML injection Inject 15-18 Units into the skin 2 (two) times daily. 18 units in am, 15 units in pm   Yes Historical Provider, MD  levothyroxine (SYNTHROID, LEVOTHROID) 125 MCG tablet Take 125 mcg by mouth daily.   Yes Historical Provider, MD  ranitidine (ZANTAC) 150 MG tablet Take 150 mg by mouth 2 (two) times daily.   Yes Historical Provider, MD  simvastatin (ZOCOR) 20 MG tablet Take 20 mg by mouth every evening.   Yes Historical Provider, MD  vitamin B-12 (CYANOCOBALAMIN) 1000 MCG tablet Take 1,000 mcg by mouth daily.   Yes Historical Provider, MD    Past Medical History  Diagnosis Date  . Diabetes mellitus   . Cancer   . Thyroid disease   . Anemia   . Hyperlipidemia      History reviewed. No pertinent past surgical history.  Social History:  reports that he has quit smoking. He has never used smokeless tobacco. He reports that he does not drink alcohol or use illicit drugs.  Family History  Problem Relation Age of Onset  . Hypertension      Review of Systems:  Constitutional: Denies fever, chills, diaphoresis, appetite change and fatigue.  HEENT: Denies photophobia, eye pain, redness, hearing loss, ear pain, congestion, sore throat, rhinorrhea, sneezing, mouth sores, trouble swallowing, neck pain, neck stiffness and tinnitus.   Respiratory: Denies SOB, DOE, cough, chest tightness,  and wheezing.   Cardiovascular: Denies chest pain, palpitations and leg swelling.  Gastrointestinal: Denies nausea, vomiting, abdominal pain, diarrhea, constipation, blood in stool and abdominal distention.  Genitourinary: Denies dysuria, urgency, frequency, hematuria, flank pain and difficulty urinating.  Musculoskeletal: swelling and mild redness of both legs and feet with ulcers bilaterally Neurological: Denies dizziness, seizures, syncope, weakness, light-headedness, numbness and headaches.  Hematological: Denies adenopathy. Easy bruising, personal or family bleeding history  Psychiatric/Behavioral: Denies suicidal ideation, mood changes, confusion, nervousness, sleep disturbance and agitation   Physical Exam:  Filed Vitals:   11/21/11 1840  BP: 147/58  Pulse: 72  Temp: 98.8 F (37.1 C)  Resp: 20  SpO2: 100%    Constitutional: Vital signs reviewed.  Patient is  a well-developed and well-nourished in no acute distress and cooperative with exam. Alert and oriented x3.  Head: Normocephalic and atraumatic Ear: TM normal bilaterally Mouth: no erythema or exudates, MMM Eyes: PERRL, EOMI, conjunctivae normal, No scleral icterus.  Neck: Supple, Trachea midline normal ROM, No JVD, mass, thyromegaly, or carotid bruit present.  Cardiovascular: RRR, S1 normal, S2  normal, no MRG, pulses symmetric and intact bilaterally Pulmonary/Chest: CTAB, no wheezes, rales, or rhonchi Abdominal: Soft. Non-tender, non-distended, bowel sounds are normal, no masses, organomegaly, or guarding present.  GU: no CVA tenderness Musculoskeletal: No joint deformities, erythema, or stiffness, ROM full and no nontender Ext: swelling over bilateral leg and feet extending from upper one third  of the tibia with areas of mild erythema, non tender, distal pulses palpable . Areas of ulcer ( 2 on the right measuring 4x1 cm and 4 x 4 cm and 1/2 cm deep  Over lateral aspect of tibia) and on the left measuring 4 x 1 cm over the calf . No discharge noted. There is a small superficial ulcer over rt lateral malleolus. blackish discoloration of left great toe. Hematology: no cervical, inguinal, or axillary adenopathy.  Neurological: A&O x3, Strenght is normal and symmetric bilaterally, cranial nerve II-XII are grossly intact, no focal motor deficit, sensory intact to light touch bilaterally.  Skin: Warm, dry and intact. No rash, cyanosis, or clubbing.  Psychiatric: Normal mood and affect. speech and behavior is normal. Judgment and thought content normal. Cognition and memory are normal.   Labs on Admission:  Results for orders placed during the hospital encounter of 11/21/11 (from the past 48 hour(s))  GLUCOSE, CAPILLARY     Status: Abnormal   Collection Time   11/21/11  6:43 PM      Component Value Range Comment   Glucose-Capillary 415 (*) 70 - 99 (mg/dL)   GLUCOSE, CAPILLARY     Status: Abnormal   Collection Time   11/21/11  9:50 PM      Component Value Range Comment   Glucose-Capillary 357 (*) 70 - 99 (mg/dL)    Comment 1 Notify RN      Comment 2 Documented in Chart     CBC     Status: Abnormal   Collection Time   11/21/11 11:25 PM      Component Value Range Comment   WBC 8.7  4.0 - 10.5 (K/uL)    RBC 2.78 (*) 4.22 - 5.81 (MIL/uL)    Hemoglobin 9.1 (*) 13.0 - 17.0 (g/dL)    HCT  16.1 (*) 09.6 - 52.0 (%)    MCV 103.6 (*) 78.0 - 100.0 (fL)    MCH 32.7  26.0 - 34.0 (pg)    MCHC 31.6  30.0 - 36.0 (g/dL)    RDW 04.5  40.9 - 81.1 (%)    Platelets 473 (*) 150 - 400 (K/uL)   COMPREHENSIVE METABOLIC PANEL     Status: Abnormal   Collection Time   11/21/11 11:25 PM      Component Value Range Comment   Sodium 130 (*) 135 - 145 (mEq/L)    Potassium 4.3  3.5 - 5.1 (mEq/L)    Chloride 94 (*) 96 - 112 (mEq/L)    CO2 30  19 - 32 (mEq/L)    Glucose, Bld 366 (*) 70 - 99 (mg/dL)    BUN 10  6 - 23 (mg/dL)    Creatinine, Ser 9.14  0.50 - 1.35 (mg/dL)    Calcium 8.8  8.4 - 10.5 (mg/dL)  Total Protein 7.4  6.0 - 8.3 (g/dL)    Albumin 3.0 (*) 3.5 - 5.2 (g/dL)    AST 10  0 - 37 (U/L)    ALT 6  0 - 53 (U/L)    Alkaline Phosphatase 95  39 - 117 (U/L)    Total Bilirubin 0.2 (*) 0.3 - 1.2 (mg/dL)    GFR calc non Af Amer 89 (*) >90 (mL/min)    GFR calc Af Amer >90  >90 (mL/min)   POCT I-STAT, CHEM 8     Status: Abnormal   Collection Time   11/21/11 11:41 PM      Component Value Range Comment   Sodium 134 (*) 135 - 145 (mEq/L)    Potassium 4.7  3.5 - 5.1 (mEq/L)    Chloride 96  96 - 112 (mEq/L)    BUN 11  6 - 23 (mg/dL)    Creatinine, Ser 1.61  0.50 - 1.35 (mg/dL)    Glucose, Bld 096 (*) 70 - 99 (mg/dL)    Calcium, Ion 0.45  1.12 - 1.32 (mmol/L)    TCO2 28  0 - 100 (mmol/L)    Hemoglobin 10.5 (*) 13.0 - 17.0 (g/dL)    HCT 40.9 (*) 81.1 - 52.0 (%)   BLOOD GAS, ARTERIAL     Status: Abnormal   Collection Time   11/22/11 12:12 AM      Component Value Range Comment   FIO2 0.21      pH, Arterial 7.402  7.350 - 7.450     pCO2 arterial 44.0  35.0 - 45.0 (mmHg)    pO2, Arterial 79.2 (*) 80.0 - 100.0 (mmHg)    Bicarbonate 26.9 (*) 20.0 - 24.0 (mEq/L)    TCO2 25.5  0 - 100 (mmol/L)    Acid-Base Excess 2.4 (*) 0.0 - 2.0 (mmol/L)    O2 Saturation 95.8      Patient temperature 37.0      Collection site RIGHT RADIAL      Drawn by [id number]      Sample type ARTERIAL DRAW      Allens  test (pass/fail) PASS  PASS    GLUCOSE, CAPILLARY     Status: Abnormal   Collection Time   11/22/11 12:54 AM      Component Value Range Comment   Glucose-Capillary 400 (*) 70 - 99 (mg/dL)    Comment 1 Call MD NNP PA CNM       Radiological Exams on Admission: No results found.  Assessment 78 y/o male with hx of uncontrolled DM ( last A1C of 10) , HTN, hypothyroidism admitted with uncontrolled blood glucose and 3 week hx of cellulitis and ulcers over bilateral legs.   Plan  *Cellulitis of lower leg with ulcers Likely In the setting of uncontrolled DM Admit to medicine  will emperically treat with IV vanco and unasyn  sent cx from ulcer wound  -wound care consult  -PT eval - will check doppler LE to r/o DVT    Uncontrolled diabetes mellitus Patient preswented with fsg in high 300 to 400. orderd 10 units regular insulin on admission and will switch home novolog to 30 units lantus in am Will need to uptitrate his home novolog regimen ( currently on 20 units in am and 18 units pm)  diabetic educator consult  Hypothyroidism  Stable   cont synthroid  Anemia  stable  cont iron tablets  DVT prophylaxis: sq lovenox  Diet: diabetic  Full code   Time Spent on Admission:  55 MINUTES  Walida Cajas 11/22/2011, 1:10 AM

## 2011-11-22 NOTE — Progress Notes (Signed)
Bilateral lower extremity venous duplex completed.  Preliminary report is negative for DVT, SVT, or a Baker's cyst. 

## 2011-11-23 LAB — BASIC METABOLIC PANEL
CO2: 30 mEq/L (ref 19–32)
Calcium: 7.9 mg/dL — ABNORMAL LOW (ref 8.4–10.5)
GFR calc non Af Amer: 88 mL/min — ABNORMAL LOW (ref >90)
Sodium: 132 mEq/L — ABNORMAL LOW (ref 135–145)

## 2011-11-23 LAB — GLUCOSE, CAPILLARY: Glucose-Capillary: 154 mg/dL — ABNORMAL HIGH (ref 70–99)

## 2011-11-23 MED ORDER — INSULIN GLARGINE 100 UNIT/ML ~~LOC~~ SOLN
30.0000 [IU] | Freq: Every day | SUBCUTANEOUS | Status: DC
  Administered 2011-11-23 – 2011-11-24 (×2): 30 [IU] via SUBCUTANEOUS
  Filled 2011-11-23: qty 10

## 2011-11-23 NOTE — ED Provider Notes (Signed)
History     CSN: [id number]  Arrival date & time 11/21/11  1757   First MD Initiated Contact with Patient 11/21/11 2303      Chief Complaint  Patient presents with  . Hyperglycemia  . Ulcers to legs     (Consider location/radiation/quality/duration/timing/severity/associated sxs/prior treatment) The history is provided by the patient.   patient is diabetic and followed by the [facility]. He has a home health nurse who cares for him daily. For about the last week he has had leg pain ulcers left worse than right. He showed them to his home health nurse today who recommended an emergency room to be evaluated. He was also noted to have elevated blood sugars have been under good control the last 24 hours. No fevers or chills. No nausea vomiting. Patient denies any significant pain although does have increased swelling in both legs and redness to left calf around one of these ulcers. Patient does ambulate with assistance. Moderate in severity. No history of same. No recent trauma.  Past Medical History  Diagnosis Date  . Diabetes mellitus   . Cancer   . Thyroid disease   . Anemia   . Hyperlipidemia     Past Surgical History  Procedure Date  . Cataract extraction, bilateral     Family History  Problem Relation Age of Onset  . Hypertension      History  Substance Use Topics  . Smoking status: Former Smoker -- 0.2 packs/day    Types: Cigarettes    Quit date: 11/22/2006  . Smokeless tobacco: Never Used  . Alcohol Use: No      Review of Systems  Constitutional: Negative for fever and chills.  HENT: Negative for neck pain and neck stiffness.   Eyes: Negative for pain.  Respiratory: Negative for shortness of breath.   Cardiovascular: Negative for chest pain.  Gastrointestinal: Negative for abdominal pain.  Genitourinary: Negative for dysuria.  Musculoskeletal: Negative for back pain.  Skin: Positive for rash and wound.  Neurological: Negative for headaches.  All other  systems reviewed and are negative.    Allergies  Review of patient's allergies indicates no known allergies.  Home Medications   Current Outpatient Rx  Name Route Sig Dispense Refill  . FERROUS SULFATE 325 (65 FE) MG PO TABS Oral Take 325 mg by mouth daily with breakfast.    . INSULIN ASPART PROT & ASPART (70-30) 100 UNIT/ML [place] SUSP Subcutaneous Inject 15-18 Units into the skin 2 (two) times daily. 18 units in am, 15 units in pm    . LEVOTHYROXINE SODIUM 125 MCG PO TABS Oral Take 125 mcg by mouth daily.    Marland Kitchen RANITIDINE HCL 150 MG PO TABS Oral Take 150 mg by mouth 2 (two) times daily.    Marland Kitchen SIMVASTATIN 20 MG PO TABS Oral Take 20 mg by mouth every evening.    Marland Kitchen VITAMIN B-12 1000 MCG PO TABS Oral Take 1,000 mcg by mouth daily.      BP 151/71  Pulse 65  Temp(Src) 97.8 F (36.6 C) (Oral)  Resp 18  Ht 6' 0.05" (1.83 m)  Wt 138 lb 0.1 oz (62.6 kg)  BMI 18.69 kg/m2  SpO2 96%  Physical Exam  Constitutional: He is oriented to person, place, and time. He appears well-developed and well-nourished.  HENT:  Head: Normocephalic and atraumatic.  Eyes: Conjunctivae and EOM are normal. Pupils are equal, round, and reactive to light.  Neck: Trachea normal. Neck supple. No thyromegaly present.  Cardiovascular: Normal  rate, regular rhythm, S1 normal, S2 normal and normal pulses.     No systolic murmur is present   No diastolic murmur is present  Pulses:      Radial pulses are 2+ on the right side, and 2+ on the left side.  Pulmonary/Chest: Effort normal and breath sounds normal. He has no wheezes. He has no rhonchi. He has no rales. He exhibits no tenderness.  Abdominal: Soft. Normal appearance and bowel sounds are normal. There is no tenderness. There is no CVA tenderness and negative Murphy's sign.  Musculoskeletal:       Left lower extremity with multiple deep ulcers, larger one over the calf region with associated erythema and swelling. Distal neurovascular intact. No cords.  Right lower  extremity also with small ulcers but no erythema and very minimal swelling. Distal neurovascular intact.  Neurological: He is alert and oriented to person, place, and time. He has normal strength. No cranial nerve deficit or sensory deficit. GCS eye subscore is 4. GCS verbal subscore is 5. GCS motor subscore is 6.  Skin: Skin is warm and dry. No rash noted. He is not diaphoretic.  Psychiatric: His speech is normal.       Cooperative and appropriate    ED Course  Procedures (including critical care time)  Labs Reviewed  GLUCOSE, CAPILLARY - Abnormal; Notable for the following:    Glucose-Capillary 415 (*)    All other components within normal limits  GLUCOSE, CAPILLARY - Abnormal; Notable for the following:    Glucose-Capillary 357 (*)    All other components within normal limits  CBC - Abnormal; Notable for the following:    RBC 2.78 (*)    Hemoglobin 9.1 (*)    HCT 28.8 (*)    MCV 103.6 (*)    Platelets 473 (*)    All other components within normal limits  COMPREHENSIVE METABOLIC PANEL - Abnormal; Notable for the following:    Sodium 130 (*)    Chloride 94 (*)    Glucose, Bld 366 (*)    Albumin 3.0 (*)    Total Bilirubin 0.2 (*)    GFR calc non Af Amer 89 (*)    All other components within normal limits  URINALYSIS, ROUTINE W REFLEX MICROSCOPIC - Abnormal; Notable for the following:    Glucose, UA >1000 (*)    Ketones, ur 15 (*)    All other components within normal limits  POCT I-STAT, CHEM 8 - Abnormal; Notable for the following:    Sodium 134 (*)    Glucose, Bld 363 (*)    Hemoglobin 10.5 (*)    HCT 31.0 (*)    All other components within normal limits  BLOOD GAS, ARTERIAL - Abnormal; Notable for the following:    pO2, Arterial 79.2 (*)    Bicarbonate 26.9 (*)    Acid-Base Excess 2.4 (*)    All other components within normal limits  GLUCOSE, CAPILLARY - Abnormal; Notable for the following:    Glucose-Capillary 400 (*)    All other components within normal limits    GLUCOSE, CAPILLARY - Abnormal; Notable for the following:    Glucose-Capillary 381 (*)    All other components within normal limits  GLUCOSE, CAPILLARY - Abnormal; Notable for the following:    Glucose-Capillary 329 (*)    All other components within normal limits  BASIC METABOLIC PANEL - Abnormal; Notable for the following:    Sodium 133 (*)    All other components within normal limits  GLUCOSE, CAPILLARY - Abnormal; Notable for the following:    Glucose-Capillary 247 (*)    All other components within normal limits  GLUCOSE, CAPILLARY - Abnormal; Notable for the following:    Glucose-Capillary 166 (*)    All other components within normal limits  GLUCOSE, CAPILLARY - Abnormal; Notable for the following:    Glucose-Capillary 125 (*)    All other components within normal limits  GLUCOSE, CAPILLARY - Abnormal; Notable for the following:    Glucose-Capillary 66 (*)    All other components within normal limits  GLUCOSE, CAPILLARY - Abnormal; Notable for the following:    Glucose-Capillary 103 (*)    All other components within normal limits  GLUCOSE, CAPILLARY - Abnormal; Notable for the following:    Glucose-Capillary 127 (*)    All other components within normal limits  GLUCOSE, CAPILLARY - Abnormal; Notable for the following:    Glucose-Capillary 161 (*)    All other components within normal limits  GLUCOSE, CAPILLARY - Abnormal; Notable for the following:    Glucose-Capillary 177 (*)    All other components within normal limits  GLUCOSE, CAPILLARY - Abnormal; Notable for the following:    Glucose-Capillary 235 (*)    All other components within normal limits  GLUCOSE, CAPILLARY - Abnormal; Notable for the following:    Glucose-Capillary 216 (*)    All other components within normal limits  WOUND CULTURE  URINE MICROSCOPIC-ADD ON  WOUND CULTURE  BASIC METABOLIC PANEL   Dg Tibia/fibula Left  11/22/2011  THIS FINAL REPORT DELAYED DUE TO PACS DOWNTIME.  *RADIOLOGY REPORT*   Clinical Data: 78 year old male with ulcers on the lower extremities.  LEFT TIBIA AND FIBULA - 2 VIEW  Comparison: None.  Findings: Vascular calcifications throughout the visualized left lower extremity.  Lateral tib-fib skin deformity located approximately 12 cm proximal to the mortise joint. Bone mineralization is within normal limits.  No cortical osteolysis. No acute fracture or dislocation identified.  IMPRESSION: Lateral soft tissue ulcers.  No radiographic evidence of acute osseous abnormality in the left tib-fib.  Original Report Authenticated By: Harley Hallmark, M.D.   Dg Tibia/fibula Right  11/22/2011  *RADIOLOGY REPORT*  Clinical Data: 78 year old male with multiple lower extremity ulcers.  RIGHT TIBIA AND FIBULA - 2 VIEW  Comparison: None.  Findings: Vascular calcifications in the right lower extremity. Grossly normal alignment at the right knee and ankle. Bone mineralization is within normal limits.  Note tib-fib fracture, dislocation, or osteolysis identified.  There is a small medial tib- fib soft tissue wound identified at the level of the tibial mid shaft.  IMPRESSION: No acute osseous abnormality identified about the right tib-fib. Medial soft tissue wound noted.  Original Report Authenticated By: Harley Hallmark, M.D.   Dg Chest Portable 1 View  11/22/2011  *RADIOLOGY REPORT*  Clinical Data: Hyperglycemia.  PORTABLE CHEST - 1 VIEW  Comparison: Chest radiograph performed 10/19/2011  Findings: The lungs are well-aerated.  Mild vascular congestion is noted.  There is no evidence of focal opacification, pleural effusion or pneumothorax.  The cardiomediastinal silhouette is within normal limits.  No acute osseous abnormalities are seen.  IMPRESSION: Mild vascular congestion noted; lungs remain grossly clear.  Original Report Authenticated By: Tonia Ghent, M.D.   Dg Foot Complete Left  11/22/2011  THIS FINAL REPORT DELAYED DUE TO PACS DOWNTIME.  *RADIOLOGY REPORT*  Clinical Data: 78 year old  male with multiple lower extremity ulcers.  LEFT FOOT - COMPLETE 3+ VIEW  Comparison: Left tib-fib from the same  day.  Findings: Chronic-appearing lateral malleolus fracture fragment. Acute or subacute appearing comminuted fracture at the base of the left fifth proximal phalanx.  No soft tissue wound identified in the left foot.  No subcutaneous gas.  Calcaneus appears intact.  No definite cortical osteolysis.  Joint spaces are preserved.  No additional fracture.  IMPRESSION: 1.  Acute or subacute comminuted fracture at the base of the left fifth proximal phalanx. 2.  Chronic-appearing lateral malleolus fracture. 3.  No plain radiographic evidence of acute osteomyelitis.  Study discussed by telephone with Dr. Suanne Marker on 11/22/2011 at 0925 hours.  Original Report Authenticated By: Harley Hallmark, M.D.   Dg Foot Complete Right  11/22/2011                     THIS FINAL REPORT DELAYED DUE TO PACS DOWNTIME.  *RADIOLOGY REPORT*  Clinical Data: 78 year old male with multiple lower extremity ulcers.  RIGHT FOOT COMPLETE - 3+ VIEW  Comparison: Right tib-fib series from the same day.  Findings: Calcaneus intact. Bone mineralization is within normal limits for age.  No subcutaneous gas.  Joint spaces are preserved. No definite acute fracture or dislocation is identified in the right foot.  Small accessory ossicle adjacent to the cuboid.  No areas of osteolysis are identified.  IMPRESSION: No acute osseous abnormality identified about the right foot.  Original Report Authenticated By: Harley Hallmark, M.D.     1. Hyperglycemia   2. Type 2 diabetes mellitus with diabetic leg ulcer    IV fluids. IV antibiotics. IV insulin via Glucomander   MDM  Exam consistent with diabetic ulcers and left leg concerning for associated cellulitis. X-rays obtained and reviewed as above. Medicine consult for admission. Case discussed with Dr. Izola Price who agrees to admission.       Sunnie Nielsen, MD 11/23/11 [phone]

## 2011-11-23 NOTE — Progress Notes (Signed)
Physical Therapy Cancellation Note  Consult received and chart reviewed.  Noted patient with toe and malleolar fracture with ortho consult pending.  Will await ortho recommendations prior to mobilizing patient.   Thank you.  [place], [place] [phone] 11/23/2011

## 2011-11-23 NOTE — Progress Notes (Addendum)
Subjective: denies any new c/o Objective: Vital signs in last 24 hours: Temp:  [97.8 F (36.6 C)-98.5 F (36.9 C)] 98.5 F (36.9 C) (03/21 0708) Pulse Rate:  [64-88] 64  (03/21 0810) Resp:  [18-20] 18  (03/21 0708) BP: (119-151)/(59-73) 146/66 mmHg (03/21 0810) SpO2:  [96 %-100 %] 100 % (03/21 0810)   Intake/Output from previous day: 03/20 0701 - 03/21 0700 In: 240 [P.O.:240] Out: 950 [Urine:950] Intake/Output this shift: Total I/O In: -  Out: 620 [Urine:620]    General Appearance:    Alert, cooperative, no distress, appears stated age  Lungs:     Clear to auscultation bilaterally, respirations unlabored   Heart:    Regular rate and rhythm, S1 and S2 normal, no murmur, rub   or gallop  Abdomen:     Soft, non-tender, bowel sounds active all four quadrants,    no masses, no organomegaly  Extremities:   RLE with shallow ulcer medially, decreased surrounding erythema, no drainage, LLE  With 2 small shallow ulcers laterally, and another posteriorly- no drainage, decreased erythema  Neurologic:   CNII-XII intact, normal strength, sensation and reflexes    throughout    Weight change:   Intake/Output Summary (Last 24 hours) at 11/23/11 1405 Last data filed at 11/23/11 4540  Gross per 24 hour  Intake    240 ml  Output   1570 ml  Net  -1330 ml    Lab Results:   Basename 11/23/11 0453 11/22/11 0800  NA 132* 133*  K 3.7 3.9  CL 98 97  CO2 30 32  GLUCOSE 197* 80  BUN 8 9  CREATININE 0.67 0.64  CALCIUM 7.9* 8.5    Basename 11/21/11 2341 11/21/11 2325  WBC -- 8.7  HGB 10.5* 9.1*  HCT 31.0* 28.8*  PLT -- 473*  MCV -- 103.6*   PT/INR No results found for this basename: LABPROT:2,INR:2 in the last 72 hours ABG  Basename 11/22/11 0012  PHART 7.402  HCO3 26.9*    Micro Results: Recent Results (from the past 240 hour(s))  WOUND CULTURE     Status: Normal (Preliminary result)   Collection Time   11/22/11  5:21 AM      Component Value Range Status Comment   Specimen Description LEG RIGHT   Final    Special Requests Normal   Final    Gram Stain     Final    Value: NO WBC SEEN     NO SQUAMOUS EPITHELIAL CELLS SEEN     FEW GRAM POSITIVE COCCI IN PAIRS     RARE GRAM POSITIVE RODS   Culture     Final    Value: ABUNDANT STAPHYLOCOCCUS AUREUS     Note: RIFAMPIN AND GENTAMICIN SHOULD NOT BE USED AS SINGLE DRUGS FOR TREATMENT OF STAPH INFECTIONS.   Report Status PENDING   Incomplete   WOUND CULTURE     Status: Normal (Preliminary result)   Collection Time   11/22/11  5:35 AM      Component Value Range Status Comment   Specimen Description LEG LEFT   Final    Special Requests NONE   Final    Gram Stain     Final    Value: NO WBC SEEN     NO SQUAMOUS EPITHELIAL CELLS SEEN     MODERATE GRAM POSITIVE COCCI IN PAIRS   Culture     Final    Value: ABUNDANT STAPHYLOCOCCUS AUREUS     Note: RIFAMPIN AND GENTAMICIN SHOULD NOT BE  USED AS SINGLE DRUGS FOR TREATMENT OF STAPH INFECTIONS.   Report Status PENDING   Incomplete    Studies/Results: Dg Tibia/fibula Left  11/22/2011  THIS FINAL REPORT DELAYED DUE TO PACS DOWNTIME.  *RADIOLOGY REPORT*  Clinical Data: 78 year old male with ulcers on the lower extremities.  LEFT TIBIA AND FIBULA - 2 VIEW  Comparison: None.  Findings: Vascular calcifications throughout the visualized left lower extremity.  Lateral tib-fib skin deformity located approximately 12 cm proximal to the mortise joint. Bone mineralization is within normal limits.  No cortical osteolysis. No acute fracture or dislocation identified.  IMPRESSION: Lateral soft tissue ulcers.  No radiographic evidence of acute osseous abnormality in the left tib-fib.  Original Report Authenticated By: Harley Hallmark, M.D.   Dg Tibia/fibula Right  11/22/2011  *RADIOLOGY REPORT*  Clinical Data: 78 year old male with multiple lower extremity ulcers.  RIGHT TIBIA AND FIBULA - 2 VIEW  Comparison: None.  Findings: Vascular calcifications in the right lower extremity. Grossly  normal alignment at the right knee and ankle. Bone mineralization is within normal limits.  Note tib-fib fracture, dislocation, or osteolysis identified.  There is a small medial tib- fib soft tissue wound identified at the level of the tibial mid shaft.  IMPRESSION: No acute osseous abnormality identified about the right tib-fib. Medial soft tissue wound noted.  Original Report Authenticated By: Harley Hallmark, M.D.   Dg Chest Portable 1 View  11/22/2011  *RADIOLOGY REPORT*  Clinical Data: Hyperglycemia.  PORTABLE CHEST - 1 VIEW  Comparison: Chest radiograph performed 10/19/2011  Findings: The lungs are well-aerated.  Mild vascular congestion is noted.  There is no evidence of focal opacification, pleural effusion or pneumothorax.  The cardiomediastinal silhouette is within normal limits.  No acute osseous abnormalities are seen.  IMPRESSION: Mild vascular congestion noted; lungs remain grossly clear.  Original Report Authenticated By: Tonia Ghent, M.D.   Dg Foot Complete Left  11/22/2011  THIS FINAL REPORT DELAYED DUE TO PACS DOWNTIME.  *RADIOLOGY REPORT*  Clinical Data: 78 year old male with multiple lower extremity ulcers.  LEFT FOOT - COMPLETE 3+ VIEW  Comparison: Left tib-fib from the same day.  Findings: Chronic-appearing lateral malleolus fracture fragment. Acute or subacute appearing comminuted fracture at the base of the left fifth proximal phalanx.  No soft tissue wound identified in the left foot.  No subcutaneous gas.  Calcaneus appears intact.  No definite cortical osteolysis.  Joint spaces are preserved.  No additional fracture.  IMPRESSION: 1.  Acute or subacute comminuted fracture at the base of the left fifth proximal phalanx. 2.  Chronic-appearing lateral malleolus fracture. 3.  No plain radiographic evidence of acute osteomyelitis.  Study discussed by telephone with Dr. Suanne Marker on 11/22/2011 at 0925 hours.  Original Report Authenticated By: Harley Hallmark, M.D.   Dg Foot Complete  Right  11/22/2011                     THIS FINAL REPORT DELAYED DUE TO PACS DOWNTIME.  *RADIOLOGY REPORT*  Clinical Data: 78 year old male with multiple lower extremity ulcers.  RIGHT FOOT COMPLETE - 3+ VIEW  Comparison: Right tib-fib series from the same day.  Findings: Calcaneus intact. Bone mineralization is within normal limits for age.  No subcutaneous gas.  Joint spaces are preserved. No definite acute fracture or dislocation is identified in the right foot.  Small accessory ossicle adjacent to the cuboid.  No areas of osteolysis are identified.  IMPRESSION: No acute osseous abnormality identified about the right foot.  Original Report Authenticated By: Harley Hallmark, M.D.   Medications: Scheduled Meds:   . ampicillin-sulbactam (UNASYN) IV  3 g Intravenous Q6H  . enoxaparin  40 mg Subcutaneous Q24H  . famotidine  20 mg Oral Daily  . ferrous sulfate  325 mg Oral Q breakfast  . insulin aspart  0-15 Units Subcutaneous Q4H  . insulin aspart  10 Units Subcutaneous Once  . insulin glargine  30 Units Subcutaneous Daily  . levothyroxine  125 mcg Oral QAC breakfast  . simvastatin  20 mg Oral QPC supper  . vancomycin  750 mg Intravenous Q12H  . vitamin B-12  1,000 mcg Oral Daily  . DISCONTD: insulin glargine  30 Units Subcutaneous Daily  . DISCONTD: insulin regular  0-10 Units Intravenous TID WC   Continuous Infusions:   . sodium chloride 125 mL/hr at 11/21/11 2331  . sodium chloride 100 mL/hr at 11/22/11 0015  . sodium chloride 100 mL/hr at 11/23/11 0849  . DISCONTD: insulin (NOVOLIN-R) infusion Stopped (11/22/11 0819)   PRN Meds:.dextrose, HYDROcodone-acetaminophen Assessment/Plan: Patient Active Hospital Problem List: Cellulitis of lower leg with infected diabetic ulcers, bil (11/22/2011) -wound cultures with abundant staph. -continue current abx,await sens. Weakness (10/20/2011) PT OT consulted, Pt states long stading and he had home health services at home prior to  admit Hypertension (10/20/2011) - on no meds outpt, monitor and treat accordingly Hypothyroidism (10/20/2011)  -continue synthroid Anemia (10/21/2011)  -hgb stable Uncontrolled diabetes mellitus  Better controlled on current Lantus, continue sliding scale coverage.  Acute/subacute comminuted fracture at the base of the left  fifth proximal phalanx/chronic appearing lateral malleolus facture -consulted ortho and Dr Turner Daniels recommending buddy taping 5th to 4th toe for 4-6wks, no limitation to weight bearing and he states no surgical intervention recommended -for chronic appearing lat malleolus fracture he states these are commonly seen but as long as pt non tender no interventions recommended   LOS: 2 days   Daimien Patmon C 11/23/2011, 2:05 PM

## 2011-11-23 NOTE — Consult Note (Signed)
WOC consult Note Reason for Consult:Bilateral LE wounds Wound type:suspect mixed etiology, arterial and venous insufficiency Pressure Ulcer POA: No Measurement 1. Right great toe with resolving blood filled blister at medial aspect of toe measuring 2xm x 1cm. 2. Right medial LE: 1.5cm x 4cm. Full thickness with 50% of wound bed obscured by necrotic tissue. 3. Left posterior LE: 5cm x 3cm Full thickness with 75% of wound bed obscured by necrotic tissue 4. Left anterior LE, proximal wound: 2.5cm x 5cm x .2cm Partial thickness thickness wound with 10% of wound bed obscured by necrotic tissue.90% pink, desiccated tissue evident 5. Left anterior LE, distal wound: 4.5cm x 3.5cm FUll thickness wound with 75% of wound bed obscured by necrotic tissue  Drainage (amount, consistency, odor) scant amount of light yellow drainage on old dressings Periwound:intact Dressing procedure/placement/frequency:I will implement collagenase (Santyl) to debride necrotic tissue and have RN to perform daily.  Patient is followed by [facility] ([facility])and will need these services to continue post discharge. I will not follow, but will remain available to this patient and his medical team.  Please re-consult if needed. Thanks, Ladona Mow, MSN, RN, [facility], CWOCN [phone]):

## 2011-11-23 NOTE — Progress Notes (Signed)
Report given to floor RN, pt transferred to [id number]

## 2011-11-24 LAB — GLUCOSE, CAPILLARY
Glucose-Capillary: 135 mg/dL — ABNORMAL HIGH (ref 70–99)
Glucose-Capillary: 140 mg/dL — ABNORMAL HIGH (ref 70–99)
Glucose-Capillary: 75 mg/dL (ref 70–99)

## 2011-11-24 LAB — WOUND CULTURE
Gram Stain: NONE SEEN
Gram Stain: NONE SEEN

## 2011-11-24 LAB — BASIC METABOLIC PANEL
BUN: 7 mg/dL (ref 6–23)
Calcium: 8.4 mg/dL (ref 8.4–10.5)
GFR calc non Af Amer: 88 mL/min — ABNORMAL LOW (ref >90)
Glucose, Bld: 84 mg/dL (ref 70–99)
Sodium: 136 mEq/L (ref 135–145)

## 2011-11-24 MED ORDER — COLLAGENASE 250 UNIT/GM EX OINT: TOPICAL_OINTMENT | Freq: Every day | CUTANEOUS | Status: AC

## 2011-11-24 MED ORDER — SULFAMETHOXAZOLE-TRIMETHOPRIM 800-160 MG PO TABS: 1.0000 | ORAL_TABLET | Freq: Two times a day (BID) | ORAL | Status: AC

## 2011-11-24 MED ORDER — INSULIN ASPART 100 UNIT/ML ~~LOC~~ SOLN
0.0000 [IU] | Freq: Three times a day (TID) | SUBCUTANEOUS | Status: DC
  Administered 2011-11-24: 2 [IU] via SUBCUTANEOUS

## 2011-11-24 MED ORDER — HYDROCODONE-ACETAMINOPHEN 5-325 MG PO TABS: 1.0000 | ORAL_TABLET | ORAL | Status: DC | PRN

## 2011-11-24 MED ORDER — COLLAGENASE 250 UNIT/GM EX OINT
TOPICAL_OINTMENT | Freq: Every day | CUTANEOUS | Status: DC
  Administered 2011-11-24: 02:00:00 via TOPICAL
  Filled 2011-11-24: qty 30

## 2011-11-24 MED ORDER — SULFAMETHOXAZOLE-TMP DS 800-160 MG PO TABS
1.0000 | ORAL_TABLET | Freq: Two times a day (BID) | ORAL | Status: DC
  Administered 2011-11-24: 1 via ORAL
  Filled 2011-11-24: qty 1

## 2011-11-24 NOTE — Evaluation (Signed)
Physical Therapy Evaluation Patient Details Name: [name] MRN: [id number] DOB: [birth year] Today's Date: 11/24/2011  Problem List:  Patient Active Problem List  Diagnoses  . Weakness  . Hypertension  . Diabetes mellitus  . Prostate cancer  . Hypothyroidism  . Hypokalemia  . Anemia  . Uncontrolled diabetes mellitus  . Cellulitis of lower leg  . Ulcer of leg due to secondary diabetes mellitus    Past Medical History:  Past Medical History  Diagnosis Date  . Diabetes mellitus   . Cancer   . Thyroid disease   . Anemia   . Hyperlipidemia    Past Surgical History:  Past Surgical History  Procedure Date  . Cataract extraction, bilateral     PT Assessment/Plan/Recommendation PT Assessment Clinical Impression Statement: 78 yo male admitted for cellulits and difficulty walking.  Pt lacks sensation in both legs, but he was able to walk in home. He will benefit from continued PT at [facility] to return to functional household independence and decrease burden of care. PT Recommendation/Assessment: Patient will need skilled PT in the acute care venue PT Problem List: Decreased strength;Decreased activity tolerance;Decreased balance;Decreased mobility PT Therapy Diagnosis : Difficulty walking;Generalized weakness PT Plan PT Frequency: Min 3X/week PT Treatment/Interventions: DME instruction;Gait training;Stair training;Functional mobility training;Therapeutic activities;Therapeutic exercise PT Recommendation Follow Up Recommendations: Home health PT Equipment Recommended: None recommended by PT PT Goals  Acute Rehab PT Goals PT Goal Formulation: With patient Time For Goal Achievement: 2 weeks Pt will go Supine/Side to Sit: Independently PT Goal: Supine/Side to Sit - Progress: Goal set today Pt will go Sit to Supine/Side: Independently PT Goal: Sit to Supine/Side - Progress: Goal set today Pt will go Sit to Stand: with modified independence PT Goal: Sit to Stand - Progress: Goal  set today Pt will Ambulate: >150 feet;with modified independence;with least restrictive assistive device PT Goal: Ambulate - Progress: Goal set today Pt will Go Up / Down Stairs: with modified independence;with least restrictive assistive device;3-5 stairs PT Goal: Up/Down Stairs - Progress: Goal set today  PT Evaluation Precautions/Restrictions  Precautions Precautions: Fall Required Braces or Orthoses: No Restrictions Weight Bearing Restrictions: No Prior Functioning  Home Living Lives With: Spouse;Family Receives Help From: Family Type of Home: House Home Layout: One level Home Access: Stairs to enter Entrance Stairs-Rails: Can reach both;Left;Right Entrance Stairs-Number of Steps: 4 Home Adaptive Equipment: Walker - rolling;Straight cane Prior Function Level of Independence: Requires assistive device for independence Able to Take Stairs?: Yes Cognition Cognition Arousal/Alertness: Awake/alert Overall Cognitive Status: Appears within functional limits for tasks assessed Orientation Level: Disoriented to person;Disoriented to place;Oriented to situation Cognition - Other Comments: pt pleasant, interactive with therapists Sensation/Coordination Sensation Light Touch: Impaired by gross assessment Stereognosis: Impaired by gross assessment Hot/Cold: Impaired by gross assessment Proprioception: Impaired by gross assessment Additional Comments: pt reports he can only feel deep pressure in lower legs Coordination Gross Motor Movements are Fluid and Coordinated: Yes Fine Motor Movements are Fluid and Coordinated: Yes Extremity Assessment RLE Assessment RLE Assessment: Exceptions to [facility] RLE AROM (degrees) RLE Overall AROM Comments: pt with kerlix over lowe leg.  Pt is able to move hip and knee, but lacks active ROM and sensation in foot. LLE Assessment LLE Assessment: Exceptions to WFL LLE AROM (degrees) LLE Overall AROM Comments: pt with kerlix over lower leg.  Pt able to  move hip and knee, but not foot Pt with 3 lateral toes of foot taped together, but pt states he wasn't able to feel they had a problem  Mobility (including Balance) Bed Mobility Bed Mobility: Yes Rolling Right: 5: Supervision Rolling Left: 5: Supervision Supine to Sit: 4: Min assist Supine to Sit Details (indicate cue type and reason): min assist to raise upper body up from bed Transfers Transfers: Yes Sit to Stand: 4: Min assist Stand to Sit: 4: Min assist Ambulation/Gait Ambulation/Gait: Yes Ambulation/Gait Assistance: 4: Min assist Ambulation/Gait Assistance Details (indicate cue type and reason): pt uses RW He has difficulty with foot placement of each foot with decreased dorsiflexion of each foor apparent Ambulation Distance (Feet): 100 Feet Assistive device: Rolling walker Gait Pattern: Step-through pattern Gait velocity: decreased Stairs: No Wheelchair Mobility Wheelchair Mobility: No  Posture/Postural Control Posture/Postural Control: No significant limitations Balance Balance Assessed: No Exercise    End of Session PT - End of Session Equipment Utilized During Treatment: Gait belt Activity Tolerance: Patient tolerated treatment well;Patient limited by fatigue Patient left: with call bell in reach;in chair Nurse Communication: Mobility status for transfers General Behavior During Session: [facility] for tasks performed Cognition: [facility] for tasks performed  Donnetta Hail 11/24/2011, 3:05 PM

## 2011-11-24 NOTE — Progress Notes (Signed)
OT Note:  Reviewed chart:  Noted ortho consult pending:  L lateral malleolar & 5th phalanx.  [place], [place] [phone] 11/24/2011

## 2011-11-24 NOTE — Evaluation (Signed)
Occupational Therapy Evaluation Patient Details Name: [name] MRN: [id number] DOB: 15-Aug-1929 Today's Date: 11/24/2011  Problem List:  Patient Active Problem List  Diagnoses  . Weakness  . Hypertension  . Diabetes mellitus  . Prostate cancer  . Hypothyroidism  . Hypokalemia  . Anemia  . Uncontrolled diabetes mellitus  . Cellulitis of lower leg  . Ulcer of leg due to secondary diabetes mellitus    Past Medical History:  Past Medical History  Diagnosis Date  . Diabetes mellitus   . Cancer   . Thyroid disease   . Anemia   . Hyperlipidemia    Past Surgical History:  Past Surgical History  Procedure Date  . Cataract extraction, bilateral     OT Assessment/Plan/Recommendation OT Assessment Clinical Impression Statement: This 78 year old man was admitted with bil LE cellulitis.  He has L foot and ankle fx--chronic and toes are buddy-taped.  Pt states he was mod I with ADLs prior to admission; he is now overall min A (mod for dressing secondary to kerlix dressings).  He is appropriate for skilled OT with min guard to supervision goals in acute OT Recommendation/Assessment: Patient will need skilled OT in the acute care venue OT Problem List: Decreased strength;Decreased activity tolerance;Impaired sensation;Impaired balance (sitting and/or standing);Decreased cognition;Decreased knowledge of use of DME or AE OT Therapy Diagnosis : Generalized weakness OT Plan OT Frequency: Min 2X/week OT Treatment/Interventions: Self-care/ADL training;DME and/or AE instruction;Therapeutic activities;Patient/family education;Balance training;Cognitive remediation/compensation OT Recommendation Follow Up Recommendations: Home health OT;Supervision/Assistance - 24 hour Equipment Recommended: None recommended by OT Individuals Consulted Consulted and Agree with Results and Recommendations: Patient OT Goals Acute Rehab OT Goals OT Goal Formulation: With patient Time For Goal Achievement: 2  weeks ADL Goals Pt Will Perform Grooming: with supervision;Standing at sink ADL Goal: Grooming - Progress: Goal set today Pt Will Perform Lower Body Bathing: Sit to stand from chair;with supervision ADL Goal: Lower Body Bathing - Progress: Goal set today Pt Will Transfer to Toilet: with min assist;Other (comment);Ambulation;Comfort height toilet (min guard) with min cues for safety ADL Goal: Toilet Transfer - Progress: Goal set today Pt Will Perform Toileting - Clothing Manipulation: with supervision;Standing ADL Goal: Toileting - Clothing Manipulation - Progress: Goal set today  OT Evaluation Precautions/Restrictions  Precautions Precautions: Fall Required Braces or Orthoses: No Restrictions Weight Bearing Restrictions: No Prior Functioning Home Living Lives With: Spouse;Family Receives Help From: Family Type of Home: House Home Layout: One level Home Access: Stairs to enter Entrance Stairs-Rails: Can reach both;Left;Right Entrance Stairs-Number of Steps: 4 Home Adaptive Equipment: Walker - rolling;Straight cane Prior Function Level of Independence: Requires assistive device for independence Able to Take Stairs?: Yes ADL ADL Eating/Feeding: Other (comment) (pt states he sometimes has difficulty due to decreased sensation) Where Assessed - Eating/Feeding: Other (comment) (provided built up foam to try) Grooming: Simulated;Set up;Other (comment) (hands/face.  Pt has 1 tooth:  rinses mouth) Where Assessed - Grooming: Sitting, bed;Unsupported Upper Body Bathing: Simulated;Set up Where Assessed - Upper Body Bathing: Sitting, bed;Unsupported Lower Body Bathing: Simulated;Minimal assistance Where Assessed - Lower Body Bathing: Sit to stand from bed Upper Body Dressing: Simulated;Set up Where Assessed - Upper Body Dressing: Sitting, bed;Unsupported Lower Body Dressing: Simulated;Moderate assistance;Other (comment) (has kerlix wrappings bil; introduced sock aid; can't use due to  dressings) Where Assessed - Lower Body Dressing: Sit to stand from bed Toilet Transfer: Not assessed Toileting - Clothing Manipulation: Simulated;Minimal assistance Where Assessed - Toileting Clothing Manipulation: Sit to stand from 3-in-1 or toilet Toileting - Hygiene:  Set up;Performed Toileting - Hygiene Details (indicate cue type and reason): used urinal at EOB Where Assessed - Toileting Hygiene: Other (comment) (sitting eob) Equipment Used: Rolling walker Ambulation Related to ADLs: stood only ADL Comments: pt states he is mostly independent with ADLs.  Has tub seat with grab bar--technique doesn't sound safe Vision/Perception  Vision - History Patient Visual Report: No change from baseline Cognition Cognition Arousal/Alertness: Awake/alert Overall Cognitive Status: Appears within functional limits for tasks assessed Orientation Level: Oriented to situation;Oriented to person Cognition - Other Comments: pt gave conflicting information at times (PLOF) Sensation/Coordination Sensation  Additional Comments: pt has impaired sensation in bil UEs and LEs Fine Motor Movements are Fluid and Coordinated:  (reports some difficulty opening condiments) Extremity Assessment RUE Assessment RUE Assessment: Within Functional Limits LUE Assessment LUE Assessment: Within Functional Limits Mobility  Bed Mobility Bed Mobility: Yes Supine to Sit: 4: Min assist Supine to Sit Details (indicate cue type and reason): min assist to raise upper body up from bed Transfers Sit to Stand: 4: Min assist Stand to Sit: 4: Min assist Exercises   End of Session OT - End of Session Activity Tolerance: Patient tolerated treatment well;Other (comment) (was waiting for lab to come back) Patient left: in bed General Behavior During Session: [facility] for tasks performed Cognition: [facility] for tasks performed Marica Otter, OTR/L [phone] 11/24/2011  [name] 11/24/2011, 4:34 PM

## 2011-11-24 NOTE — Discharge Summary (Signed)
Discharge Note  Name: [name] MRN: [id number] DOB: 1929/01/09 78 y.o.  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      ferrous sulfate 325 (65 FE) MG tablet   Take 325 mg by mouth daily with breakfast.      HYDROcodone-acetaminophen 5-325 MG per tablet   Commonly known as: NORCO   Take 1 tablet by mouth every 4 (four) hours as needed.      insulin aspart protamine-insulin aspart (70-30) 100 UNIT/ML injection   Commonly known as: NOVOLOG 70/30   Inject 15-18 Units into the skin 2 (two) times daily. 18 units in am, 15 units in pm      levothyroxine 125 MCG tablet   Commonly known as: SYNTHROID, LEVOTHROID   Take 125 mcg by mouth daily.      ranitidine 150 MG tablet   Commonly known as: ZANTAC   Take 150 mg by mouth 2 (two) times daily.      simvastatin 20 MG tablet   Commonly known as: ZOCOR   Take 20 mg by mouth every evening.      sulfamethoxazole-trimethoprim 800-160 MG per tablet   Commonly known as: BACTRIM DS,SEPTRA DS   Take 1 tablet by mouth 2 (two) times daily.      vitamin B-12 1000 MCG tablet   Commonly known as: CYANOCOBALAMIN   Take 1,000 mcg by mouth daily.            Disposition and follow-up:   Mr.Bowman Ozaki was discharged from [facility] in improved/stable condition.    Follow-up Appointments: Discharge Orders    Future Orders Please Complete By Expires   Diet Carb Modified      Increase activity slowly      Discharge instructions      Comments:    [facility] to resume services-RN, PT, OT and AIDE      Consultations:    Procedures Performed:  Dg Tibia/fibula Left  11/22/2011  THIS FINAL REPORT DELAYED DUE TO PACS DOWNTIME.  *RADIOLOGY REPORT*  Clinical Data: 78 year old male with ulcers on the lower extremities.  LEFT TIBIA AND FIBULA - 2 VIEW  Comparison: None.  Findings: Vascular calcifications throughout the visualized left lower extremity.  Lateral tib-fib skin deformity located approximately 12 cm proximal to the mortise joint. Bone mineralization is within normal limits.  No cortical osteolysis. No acute fracture or dislocation identified.  IMPRESSION: Lateral soft tissue ulcers.  No radiographic evidence of acute osseous abnormality in the left tib-fib.  Original Report Authenticated By: Harley Hallmark, M.D.   Dg Tibia/fibula Right  11/22/2011  *RADIOLOGY REPORT*  Clinical Data: 78 year old male with multiple lower extremity ulcers.  RIGHT TIBIA AND FIBULA - 2 VIEW  Comparison: None.  Findings: Vascular calcifications in the right lower extremity. Grossly normal alignment at the right knee and ankle. Bone mineralization is within normal limits.  Note tib-fib fracture, dislocation, or osteolysis identified.  There is a small medial tib- fib soft tissue wound identified at the level  of the tibial mid shaft.  IMPRESSION: No acute osseous abnormality identified about the right tib-fib. Medial soft tissue wound noted.  Original Report Authenticated By: Harley Hallmark, M.D.   Dg Chest Portable 1 View  11/22/2011  *RADIOLOGY REPORT*  Clinical Data: Hyperglycemia.  PORTABLE CHEST - 1 VIEW  Comparison: Chest radiograph performed 10/19/2011  Findings: The lungs are well-aerated.  Mild vascular congestion is noted.  There is no evidence of focal opacification, pleural effusion or pneumothorax.  The cardiomediastinal silhouette is within normal limits.  No acute osseous abnormalities are seen.  IMPRESSION: Mild vascular congestion noted; lungs remain  grossly clear.  Original Report Authenticated By: Tonia Ghent, M.D.   Dg Foot Complete Left  11/22/2011  THIS FINAL REPORT DELAYED DUE TO PACS DOWNTIME.  *RADIOLOGY REPORT*  Clinical Data: 78 year old male with multiple lower extremity ulcers.  LEFT FOOT - COMPLETE 3+ VIEW  Comparison: Left tib-fib from the same day.  Findings: Chronic-appearing lateral malleolus fracture fragment. Acute or subacute appearing comminuted fracture at the base of the left fifth proximal phalanx.  No soft tissue wound identified in the left foot.  No subcutaneous gas.  Calcaneus appears intact.  No definite cortical osteolysis.  Joint spaces are preserved.  No additional fracture.  IMPRESSION: 1.  Acute or subacute comminuted fracture at the base of the left fifth proximal phalanx. 2.  Chronic-appearing lateral malleolus fracture. 3.  No plain radiographic evidence of acute osteomyelitis.  Study discussed by telephone with Dr. Suanne Marker on 11/22/2011 at 0925 hours.  Original Report Authenticated By: Harley Hallmark, M.D.   Dg Foot Complete Right  11/22/2011                     THIS FINAL REPORT DELAYED DUE TO PACS DOWNTIME.  *RADIOLOGY REPORT*  Clinical Data: 78 year old male with multiple lower extremity ulcers.  RIGHT FOOT COMPLETE - 3+ VIEW  Comparison: Right tib-fib series from the same day.  Findings: Calcaneus intact. Bone mineralization is within normal limits for age.  No subcutaneous gas.  Joint spaces are preserved. No definite acute fracture or dislocation is identified in the right foot.  Small accessory ossicle adjacent to the cuboid.  No areas of osteolysis are identified.  IMPRESSION: No acute osseous abnormality identified about the right foot.  Original Report Authenticated By: Harley Hallmark, M.D.     Admission HPI   Hospital Course by problem list: Principal Problem:  *Cellulitis of lower leg/ Ulcer of leg due to secondary diabetes mellitus Active Problems:  Weakness  Hypertension  Hypothyroidism   Anemia  Uncontrolled diabetes mellitus  Cellulitis of lower leg with infected diabetic ulcers, bil (11/22/2011) -MSSA Upon admission 1 cultures were obtained and patient was empirically placed on antibiotics with Unasyn and vancomycin. Local wound care was consulted and saw patient and recommended collagenase daily for his local wound care. The wound cultures grew staph aureus insulin pansensitive. he had x-rays done which showed no evidence of osteomyelitis. The patient has remained afebrile with no leukocytosis. He'll be discharged at this time to follow up outpatient with PCP and home health RN to follow for dressing changes. Weakness (10/20/2011) PT OT consulted, and home health PT recommended.  Hypothyroidism (10/20/2011) -he was maintained synthroid Anemia (10/21/2011)  his hemoglobin (was monitored and remained stable.  Uncontrolled diabetes mellitus  Patient's blood sugars were elevated on admission and required IV insulin via glucose stabilizer. Once he met criteria IV insulin was discontinued and he was transitioned to Lantus and  his sugars have been better controlled. His to continue his outpatient insulin upon discharge and followup with his primary care physician.  Acute/subacute comminuted fracture at the base of the left fifth proximal phalanx/chronic appearing lateral malleolus facture  Incidental finding on x-rays done on admission #1. Patient denied any complaints of pain in his foot and on exam was nontender.ortho was consulted and Dr Turner Daniels recommended buddy taping 5th to 4th toe for 4-6wks, no limitation to weight bearing and he stated no surgical intervention recommended. This was discussed with patient and home health RN to follow outpatient. -for chronic appearing lat malleolus fracture he states these are commonly seen but as long as pt non tender no interventions recommended  LOS: 2 days     Discharge Vitals:  BP 126/72  Pulse 79  Temp(Src) 98.5 F (36.9 C) (Oral)  Resp  18  Ht 6' 0.05" (1.83 m)  Wt 62.6 kg (138 lb 0.1 oz)  BMI 18.69 kg/m2  SpO2 99%  Discharge Labs:  Results for orders placed during the hospital encounter of 11/21/11 (from the past 24 hour(s))  GLUCOSE, CAPILLARY     Status: Abnormal   Collection Time   11/23/11  8:18 PM      Component Value Range   Glucose-Capillary 158 (*) 70 - 99 (mg/dL)   Comment 1 Notify RN    GLUCOSE, CAPILLARY     Status: Abnormal   Collection Time   11/24/11 12:54 AM      Component Value Range   Glucose-Capillary 101 (*) 70 - 99 (mg/dL)   Comment 1 Documented in Chart     Comment 2 Notify RN    GLUCOSE, CAPILLARY     Status: Normal   Collection Time   11/24/11  3:46 AM      Component Value Range   Glucose-Capillary 83  70 - 99 (mg/dL)   Comment 1 Documented in Chart     Comment 2 Notify RN    BASIC METABOLIC PANEL     Status: Abnormal   Collection Time   11/24/11  4:17 AM      Component Value Range   Sodium 136  135 - 145 (mEq/L)   Potassium 3.8  3.5 - 5.1 (mEq/L)   Chloride 101  96 - 112 (mEq/L)   CO2 28  19 - 32 (mEq/L)   Glucose, Bld 84  70 - 99 (mg/dL)   BUN 7  6 - 23 (mg/dL)   Creatinine, Ser 4.09  0.50 - 1.35 (mg/dL)   Calcium 8.4  8.4 - 81.1 (mg/dL)   GFR calc non Af Amer 88 (*) >90 (mL/min)   GFR calc Af Amer >90  >90 (mL/min)  GLUCOSE, CAPILLARY     Status: Normal   Collection Time   11/24/11  7:41 AM      Component Value Range   Glucose-Capillary 75  70 - 99 (mg/dL)   Comment 1 Notify RN    GLUCOSE, CAPILLARY     Status: Abnormal   Collection Time   11/24/11 11:50 AM      Component Value Range   Glucose-Capillary 111 (*) 70 - 99 (mg/dL)   Comment 1 Notify RN    GLUCOSE, CAPILLARY     Status: Abnormal   Collection Time   11/24/11  5:15 PM      Component Value Range   Glucose-Capillary 133 (*) 70 - 99 (mg/dL)   Comment 1 Notify RN      SignedKela Millin 11/24/2011, 5:21 PM

## 2011-11-24 NOTE — Progress Notes (Signed)
PT Cancellation Note  Treatment cancelled today due to medical issues with patient which prohibited therapy Per Dr. Salvatore Decent note, pt awaiting ortho consult.  Will proceed with PT once weight bearing status and precautions are defined.  Ebony Hail [facility] 11/24/2011, 11:16 AM

## 2011-11-27 MED FILL — Insulin Aspart Inj 100 Unit/ML: SUBCUTANEOUS | Qty: 0.03 | Status: AC

## 2011-11-27 MED FILL — Insulin Glargine Inj 100 Unit/ML: SUBCUTANEOUS | Qty: 0.3 | Status: AC

## 2011-12-04 ENCOUNTER — Encounter (HOSPITAL_COMMUNITY): Payer: Self-pay | Admitting: Emergency Medicine

## 2011-12-04 ENCOUNTER — Emergency Department (HOSPITAL_COMMUNITY)
Admission: EM | Admit: 2011-12-04 | Discharge: 2011-12-04 | Disposition: A | Discharge status: Home / Self Care | Payer: Medicare Other | Attending: Emergency Medicine | Admitting: Emergency Medicine

## 2011-12-04 ENCOUNTER — Other Ambulatory Visit: Payer: Self-pay

## 2011-12-04 DIAGNOSIS — E079 Disorder of thyroid, unspecified: Secondary | ICD-10-CM | POA: Insufficient documentation

## 2011-12-04 DIAGNOSIS — R739 Hyperglycemia, unspecified: Secondary | ICD-10-CM

## 2011-12-04 DIAGNOSIS — E1169 Type 2 diabetes mellitus with other specified complication: Secondary | ICD-10-CM | POA: Insufficient documentation

## 2011-12-04 DIAGNOSIS — E785 Hyperlipidemia, unspecified: Secondary | ICD-10-CM | POA: Insufficient documentation

## 2011-12-04 DIAGNOSIS — R5381 Other malaise: Secondary | ICD-10-CM | POA: Insufficient documentation

## 2011-12-04 DIAGNOSIS — R5383 Other fatigue: Secondary | ICD-10-CM | POA: Insufficient documentation

## 2011-12-04 DIAGNOSIS — Z79899 Other long term (current) drug therapy: Secondary | ICD-10-CM | POA: Insufficient documentation

## 2011-12-04 LAB — CBC
HCT: 28.4 % — ABNORMAL LOW (ref 39.0–52.0)
Hemoglobin: 9.4 g/dL — ABNORMAL LOW (ref 13.0–17.0)
RBC: 2.89 MIL/uL — ABNORMAL LOW (ref 4.22–5.81)
WBC: 8.8 10*3/uL (ref 4.0–10.5)

## 2011-12-04 LAB — DIFFERENTIAL
Basophils Absolute: 0 10*3/uL (ref 0.0–0.1)
Lymphocytes Relative: 9 % — ABNORMAL LOW (ref 12–46)
Monocytes Absolute: 0.6 10*3/uL (ref 0.1–1.0)
Monocytes Relative: 7 % (ref 3–12)
Neutro Abs: 6.5 10*3/uL (ref 1.7–7.7)

## 2011-12-04 LAB — URINALYSIS, ROUTINE W REFLEX MICROSCOPIC
Glucose, UA: NEGATIVE mg/dL
Ketones, ur: 15 mg/dL — AB
pH: 5.5 (ref 5.0–8.0)

## 2011-12-04 LAB — BASIC METABOLIC PANEL
BUN: 31 mg/dL — ABNORMAL HIGH (ref 6–23)
CO2: 29 mEq/L (ref 19–32)
Chloride: 97 mEq/L (ref 96–112)
Creatinine, Ser: 1.18 mg/dL (ref 0.50–1.35)

## 2011-12-04 LAB — TROPONIN I: Troponin I: <0.3 ng/mL (ref <0.30)

## 2011-12-04 LAB — URINE MICROSCOPIC-ADD ON

## 2011-12-04 MED ORDER — SODIUM CHLORIDE 0.9 % IV SOLN
INTRAVENOUS | Status: DC
  Administered 2011-12-04: 19:00:00 via INTRAVENOUS

## 2011-12-04 MED ORDER — SODIUM CHLORIDE 0.9 % IV BOLUS (SEPSIS)
1000.0000 mL | Freq: Once | INTRAVENOUS | Status: AC
  Administered 2011-12-04: 1000 mL via INTRAVENOUS

## 2011-12-04 NOTE — ED Notes (Signed)
ZOX:[id number]<AV> Expected date:<BR> Expected time:<BR> Means of arrival:<BR> Comments:<BR> Hyperglycemia

## 2011-12-04 NOTE — ED Notes (Signed)
Pt. Is still unable to use he restroom at this time.

## 2011-12-04 NOTE — ED Notes (Signed)
Pt reports that home health nurse stated that blood sugar was elevated ("350ish). Also report extreme weakness and fatigue when ambulating with walker. Pt. States that he hasn't been eating well for the past couple of days. Denis any nausea/vomiting or diarrhea.

## 2011-12-04 NOTE — ED Provider Notes (Signed)
History     CSN: [id number]  Arrival date & time 12/04/11  1457   First MD Initiated Contact with Patient 12/04/11 1514      Chief Complaint  Patient presents with  . Hyperglycemia  . Fatigue   [facility]  (Consider location/radiation/quality/duration/timing/severity/associated sxs/prior treatment) HPI This 78 year old male is sent to the emergency department per the recommendation of his home health nurse. The patient was sent for high blood sugar level today. His blood sugar was over 300 so sent to the ED. Patient has no new complaints. The patient has been generally weak and fatigued with a poor appetite for several months. He was recently admitted within the last couple weeks for localized cellulitis to the lower leg were both lower legs with some partial thickness superficial ulcers for several months. The patient is now finished his Bactrim for his cellulitis. His legs are much improved with no further redness present. The patient feels generally weak for several months which has been unchanged. He denies fever confusion headache stiff neck cough chest pain shortness breath abdominal pain vomiting or diarrhea. He does have chronically black stools since he is taking iron for his anemia. He has not had any new lateralizing or focal neurologic symptoms just generalized weakness. He lives with his wife and his daughter. He uses a walker to help with stability due to generalized weakness. There is no sudden worsening of his weakness at all since his discharge week and a half ago. Patient himself is unsure why the home health nurse sent him to the ED for evaluation. Past Medical History  Diagnosis Date  . Diabetes mellitus   . Cancer   . Thyroid disease   . Anemia   . Hyperlipidemia     Past Surgical History  Procedure Date  . Cataract extraction, bilateral     Family History  Problem Relation Age of Onset  . Hypertension      History  Substance Use Topics  . Smoking status:  Former Smoker -- 0.2 packs/day    Types: Cigarettes    Quit date: 11/22/2006  . Smokeless tobacco: Never Used  . Alcohol Use: No      Review of Systems  Constitutional: Positive for fatigue. Negative for fever.       10 Systems reviewed and are negative for acute change except as noted in the HPI.  HENT: Negative for congestion.   Eyes: Negative for discharge and redness.  Respiratory: Negative for cough and shortness of breath.   Cardiovascular: Negative for chest pain.  Gastrointestinal: Negative for vomiting and abdominal pain.  Genitourinary: Negative for dysuria.  Musculoskeletal: Negative for back pain.  Skin: Negative for rash.  Neurological: Positive for weakness. Negative for syncope, numbness and headaches.  Psychiatric/Behavioral:       No behavior change.    Allergies  Review of patient's allergies indicates no known allergies.  Home Medications   Current Outpatient Rx  Name Route Sig Dispense Refill  . COLLAGENASE 250 UNIT/GM EX OINT Topical Apply topically daily. 15 g 0  . FERROUS SULFATE 325 (65 FE) MG PO TABS Oral Take 325 mg by mouth daily with breakfast.    . HYDROCODONE-ACETAMINOPHEN 5-325 MG PO TABS Oral Take 1 tablet by mouth every 4 (four) hours as needed. Pain.    . INSULIN ASPART PROT & ASPART (70-30) 100 UNIT/ML  SUSP Subcutaneous Inject 15-18 Units into the skin 2 (two) times daily. 18 units in am, 15 units in pm    . LEVOTHYROXINE  SODIUM 125 MCG PO TABS Oral Take 125 mcg by mouth daily.    Marland Kitchen RANITIDINE HCL 150 MG PO TABS Oral Take 150 mg by mouth 2 (two) times daily.    Marland Kitchen SIMVASTATIN 20 MG PO TABS Oral Take 20 mg by mouth every evening.    . SULFAMETHOXAZOLE-TRIMETHOPRIM 800-160 MG PO TABS Oral Take 1 tablet by mouth 2 (two) times daily. 20 tablet 0    For days  . VITAMIN B-12 1000 MCG PO TABS Oral Take 1,000 mcg by mouth daily.      BP 120/61  Pulse 76  Temp(Src) 97.9 F (36.6 C) (Axillary)  Resp 17  SpO2 99%  Physical Exam    Nursing note and vitals reviewed. Constitutional:       Awake, alert, nontoxic appearance.  HENT:  Head: Atraumatic.  Eyes: Right eye exhibits no discharge. Left eye exhibits no discharge.  Neck: Neck supple.  Cardiovascular: Normal rate and regular rhythm.   No murmur heard. Pulmonary/Chest: Effort normal and breath sounds normal. No respiratory distress. He has no wheezes. He has no rales. He exhibits no tenderness.  Abdominal: Soft. Bowel sounds are normal. There is no tenderness. There is no rebound.  Musculoskeletal: He exhibits no edema and no tenderness.       Baseline ROM, no obvious new focal weakness. Bilateral lower legs have a few superficial partial thickness ulcers without purulent drainage foul odor or surrounding cellulitis or crepitus and both feet are warm and dry with capillary refill less than 2 seconds in all toes and normal light touch and movement according to the patient. I cannot palpate pulses in either feet so nurses will check with Dopplers but both feet have good color and good warmth without foot pain or color change according to the patient.  Neurological:       Mental status and motor strength appears baseline for patient and situation.  Skin: No rash noted.  Psychiatric: He has a normal mood and affect.    ED Course  Procedures (including critical care time) ECG: Sinus rhythm, ventricular rate 71, left axis deviation, right bundle branch block, left anterior fascicular block, no acute ischemic changes noted, no significant change compared with 14February 2013 Labs Reviewed  CBC - Abnormal; Notable for the following:    RBC 2.89 (*)    Hemoglobin 9.4 (*)    HCT 28.4 (*)    Platelets 438 (*)    All other components within normal limits  DIFFERENTIAL - Abnormal; Notable for the following:    Lymphocytes Relative 9 (*)    Eosinophils Relative 10 (*)    Eosinophils Absolute 0.8 (*)    All other components within normal limits  BASIC METABOLIC PANEL -  Abnormal; Notable for the following:    Sodium 133 (*)    Glucose, Bld 303 (*)    BUN 31 (*)    GFR calc non Af Amer 56 (*)    GFR calc Af Amer 65 (*)    All other components within normal limits  URINALYSIS, ROUTINE W REFLEX MICROSCOPIC - Abnormal; Notable for the following:    APPearance TURBID (*)    Ketones, ur 15 (*)    Protein, ur 100 (*)    Leukocytes, UA TRACE (*)    All other components within normal limits  GLUCOSE, CAPILLARY - Abnormal; Notable for the following:    Glucose-Capillary 312 (*)    All other components within normal limits  URINE MICROSCOPIC-ADD ON - Abnormal; Notable for  the following:    Casts GRANULAR CAST (*)    All other components within normal limits  TROPONIN I  LAB REPORT - SCANNED   No results found.   1. Hyperglycemia   2. Diabetes mellitus       MDM  Patient understand and agree with initial ED impression and plan with expectations set for ED visit.Pt stable in ED with no significant deterioration in condition.I doubt any other EMC precluding discharge at this time including, but not necessarily limited to the following:DKA, SBI.        Hurman Horn, MD 12/05/11 2201

## 2011-12-04 NOTE — Discharge Instructions (Signed)
Medical conditions can worsen, so it is also important to return immediately as directed below, or if you have other serious concerns develop. °RETURN IMMEDIATELY IF you develop new shortness of breath, chest pain, fever, have difficulty moving parts of your body (new weakness, numbness, or incoordination), sudden change in speech, vision, swallowing, or understanding, faint or develop new dizziness, severe headache, become poorly responsive or have an altered mental status compared to baseline for you, new rash, abdominal pain, or bloody stools,  °Return sooner also if you develop new problems for which you have not talked to your caregiver but you feel may be emergency medical conditions, or are unable to be cared for safely at home. °

## 2011-12-04 NOTE — ED Notes (Signed)
Patient provided with coffee at this time. Awaiting ride home.

## 2011-12-04 NOTE — ED Notes (Signed)
CBG : 312 

## 2012-03-29 ENCOUNTER — Other Ambulatory Visit (HOSPITAL_BASED_OUTPATIENT_CLINIC_OR_DEPARTMENT_OTHER): Payer: Self-pay | Admitting: General Surgery

## 2012-03-29 ENCOUNTER — Other Ambulatory Visit: Payer: Self-pay

## 2012-03-29 ENCOUNTER — Encounter (HOSPITAL_BASED_OUTPATIENT_CLINIC_OR_DEPARTMENT_OTHER): Payer: Medicare Other | Attending: General Surgery

## 2012-03-29 ENCOUNTER — Ambulatory Visit (HOSPITAL_COMMUNITY)
Admission: RE | Admit: 2012-03-29 | Discharge: 2012-03-29 | Disposition: A | Discharge status: Home / Self Care | Payer: Medicare Other | Source: Ambulatory Visit | Attending: General Surgery | Admitting: General Surgery

## 2012-03-29 DIAGNOSIS — E039 Hypothyroidism, unspecified: Secondary | ICD-10-CM | POA: Insufficient documentation

## 2012-03-29 DIAGNOSIS — L97309 Non-pressure chronic ulcer of unspecified ankle with unspecified severity: Secondary | ICD-10-CM | POA: Insufficient documentation

## 2012-03-29 DIAGNOSIS — I10 Essential (primary) hypertension: Secondary | ICD-10-CM | POA: Insufficient documentation

## 2012-03-29 DIAGNOSIS — E1169 Type 2 diabetes mellitus with other specified complication: Secondary | ICD-10-CM | POA: Insufficient documentation

## 2012-03-29 DIAGNOSIS — L89109 Pressure ulcer of unspecified part of back, unspecified stage: Secondary | ICD-10-CM | POA: Insufficient documentation

## 2012-03-29 DIAGNOSIS — M869 Osteomyelitis, unspecified: Secondary | ICD-10-CM

## 2012-03-29 DIAGNOSIS — E119 Type 2 diabetes mellitus without complications: Secondary | ICD-10-CM | POA: Insufficient documentation

## 2012-03-29 DIAGNOSIS — L8990 Pressure ulcer of unspecified site, unspecified stage: Secondary | ICD-10-CM | POA: Insufficient documentation

## 2012-03-29 LAB — GLUCOSE, CAPILLARY: Glucose-Capillary: 341 mg/dL — ABNORMAL HIGH (ref 70–99)

## 2012-03-29 NOTE — Progress Notes (Signed)
[facility]  NAME:  [name], [name] NO.:  [id number]  MEDICAL RECORD NO.:  [id number]      DATE OF BIRTH:  September 09, 1928  PHYSICIAN:  Ardath Sax, M.D.           VISIT DATE:                                  OFFICE VISIT   [name] is an 78 year old gentleman sent here by Dr. Wayland Salinas.  He comes here because of an ulcer on the lateral aspect of his right ankle like it is about 2 cm wide and appears to go right down into the fascia over the bone.  I debrided this of necrotic material and got down to fairly good bleeding granulation tissue, and then we treated it with Santyl dressings, and we are going to change these 3 times a week. He also has 2 sacral decubitus, which are mounds of draining flesh about 1.5 cm to 2 cm in diameter.  They almost look malignant, and so I biopsied these and we cultured them also, and we sent him for an x-ray of his right ankle.  He is a gentleman who has some history of renal failure.  He also has had some history of high blood pressure, type 2 diabetes.  He has had hypothyroidism and anemia in the past.  His insulin coverage is given twice a day for his diabetes.  He is also on iron and levothyroxine and hydrocodone for discomfort.  He takes ranitidine and simvastatin and at the present time, he is on Bactrim. He is afebrile.  His blood pressure is 140/90.  His pulse is 70.  He was sent here with some blood work, which showed a hemoglobin of 9.4 and a white count of 10,000.  We looked him over and carefully, I debrided all these areas and sent tissue for biopsy, and we got a cultures, and we will see him back next week after the x-ray.  He is going to need a lot of debridement and Santyl and some biologic dressings in the future, perhaps Dermagrafts.     Ardath Sax, M.D.     PP/MEDQ  D:  03/29/2012  T:  03/29/2012  Job:  [id number]

## 2012-04-04 ENCOUNTER — Emergency Department (HOSPITAL_COMMUNITY): Payer: Medicare Other

## 2012-04-04 ENCOUNTER — Encounter (HOSPITAL_COMMUNITY): Payer: Self-pay | Admitting: Emergency Medicine

## 2012-04-04 ENCOUNTER — Other Ambulatory Visit: Payer: Self-pay

## 2012-04-04 ENCOUNTER — Inpatient Hospital Stay (HOSPITAL_COMMUNITY)
Admission: EM | Admit: 2012-04-04 | Discharge: 2012-04-26 | DRG: 637 | Disposition: A | Discharge status: Rehabilitation Facility | Payer: Medicare Other | Attending: Pulmonary Disease | Admitting: Pulmonary Disease

## 2012-04-04 DIAGNOSIS — Z91199 Patient's noncompliance with other medical treatment and regimen due to unspecified reason: Secondary | ICD-10-CM

## 2012-04-04 DIAGNOSIS — A419 Sepsis, unspecified organism: Secondary | ICD-10-CM

## 2012-04-04 DIAGNOSIS — E119 Type 2 diabetes mellitus without complications: Secondary | ICD-10-CM

## 2012-04-04 DIAGNOSIS — R531 Weakness: Secondary | ICD-10-CM

## 2012-04-04 DIAGNOSIS — B3781 Candidal esophagitis: Secondary | ICD-10-CM | POA: Diagnosis not present

## 2012-04-04 DIAGNOSIS — IMO0002 Reserved for concepts with insufficient information to code with codable children: Secondary | ICD-10-CM

## 2012-04-04 DIAGNOSIS — D72829 Elevated white blood cell count, unspecified: Secondary | ICD-10-CM | POA: Diagnosis not present

## 2012-04-04 DIAGNOSIS — T68XXXA Hypothermia, initial encounter: Secondary | ICD-10-CM

## 2012-04-04 DIAGNOSIS — R1310 Dysphagia, unspecified: Secondary | ICD-10-CM

## 2012-04-04 DIAGNOSIS — I5041 Acute combined systolic (congestive) and diastolic (congestive) heart failure: Secondary | ICD-10-CM | POA: Diagnosis present

## 2012-04-04 DIAGNOSIS — E870 Hyperosmolality and hypernatremia: Secondary | ICD-10-CM

## 2012-04-04 DIAGNOSIS — T380X5A Adverse effect of glucocorticoids and synthetic analogues, initial encounter: Secondary | ICD-10-CM | POA: Diagnosis not present

## 2012-04-04 DIAGNOSIS — D649 Anemia, unspecified: Secondary | ICD-10-CM

## 2012-04-04 DIAGNOSIS — E1165 Type 2 diabetes mellitus with hyperglycemia: Secondary | ICD-10-CM

## 2012-04-04 DIAGNOSIS — I519 Heart disease, unspecified: Secondary | ICD-10-CM

## 2012-04-04 DIAGNOSIS — J189 Pneumonia, unspecified organism: Secondary | ICD-10-CM | POA: Diagnosis not present

## 2012-04-04 DIAGNOSIS — E13622 Other specified diabetes mellitus with other skin ulcer: Secondary | ICD-10-CM

## 2012-04-04 DIAGNOSIS — R570 Cardiogenic shock: Secondary | ICD-10-CM | POA: Diagnosis present

## 2012-04-04 DIAGNOSIS — E1110 Type 2 diabetes mellitus with ketoacidosis without coma: Secondary | ICD-10-CM

## 2012-04-04 DIAGNOSIS — R197 Diarrhea, unspecified: Secondary | ICD-10-CM | POA: Diagnosis not present

## 2012-04-04 DIAGNOSIS — E876 Hypokalemia: Secondary | ICD-10-CM

## 2012-04-04 DIAGNOSIS — K560 Paralytic ileus: Secondary | ICD-10-CM | POA: Diagnosis not present

## 2012-04-04 DIAGNOSIS — I5022 Chronic systolic (congestive) heart failure: Secondary | ICD-10-CM

## 2012-04-04 DIAGNOSIS — E860 Dehydration: Secondary | ICD-10-CM

## 2012-04-04 DIAGNOSIS — L97909 Non-pressure chronic ulcer of unspecified part of unspecified lower leg with unspecified severity: Secondary | ICD-10-CM

## 2012-04-04 DIAGNOSIS — D509 Iron deficiency anemia, unspecified: Secondary | ICD-10-CM | POA: Diagnosis present

## 2012-04-04 DIAGNOSIS — Z79899 Other long term (current) drug therapy: Secondary | ICD-10-CM

## 2012-04-04 DIAGNOSIS — R6521 Severe sepsis with septic shock: Secondary | ICD-10-CM | POA: Diagnosis present

## 2012-04-04 DIAGNOSIS — I214 Non-ST elevation (NSTEMI) myocardial infarction: Secondary | ICD-10-CM

## 2012-04-04 DIAGNOSIS — I10 Essential (primary) hypertension: Secondary | ICD-10-CM

## 2012-04-04 DIAGNOSIS — R5381 Other malaise: Secondary | ICD-10-CM

## 2012-04-04 DIAGNOSIS — E11621 Type 2 diabetes mellitus with foot ulcer: Secondary | ICD-10-CM

## 2012-04-04 DIAGNOSIS — C61 Malignant neoplasm of prostate: Secondary | ICD-10-CM

## 2012-04-04 DIAGNOSIS — I959 Hypotension, unspecified: Secondary | ICD-10-CM

## 2012-04-04 DIAGNOSIS — L89109 Pressure ulcer of unspecified part of back, unspecified stage: Secondary | ICD-10-CM | POA: Diagnosis present

## 2012-04-04 DIAGNOSIS — N179 Acute kidney failure, unspecified: Secondary | ICD-10-CM

## 2012-04-04 DIAGNOSIS — E46 Unspecified protein-calorie malnutrition: Secondary | ICD-10-CM | POA: Diagnosis present

## 2012-04-04 DIAGNOSIS — L97509 Non-pressure chronic ulcer of other part of unspecified foot with unspecified severity: Secondary | ICD-10-CM | POA: Diagnosis present

## 2012-04-04 DIAGNOSIS — G9341 Metabolic encephalopathy: Secondary | ICD-10-CM | POA: Diagnosis present

## 2012-04-04 DIAGNOSIS — R4182 Altered mental status, unspecified: Secondary | ICD-10-CM | POA: Diagnosis present

## 2012-04-04 DIAGNOSIS — L89159 Pressure ulcer of sacral region, unspecified stage: Secondary | ICD-10-CM

## 2012-04-04 DIAGNOSIS — J9600 Acute respiratory failure, unspecified whether with hypoxia or hypercapnia: Secondary | ICD-10-CM

## 2012-04-04 DIAGNOSIS — E1065 Type 1 diabetes mellitus with hyperglycemia: Secondary | ICD-10-CM | POA: Diagnosis present

## 2012-04-04 DIAGNOSIS — G9340 Encephalopathy, unspecified: Secondary | ICD-10-CM

## 2012-04-04 DIAGNOSIS — K2980 Duodenitis without bleeding: Secondary | ICD-10-CM | POA: Diagnosis present

## 2012-04-04 DIAGNOSIS — D638 Anemia in other chronic diseases classified elsewhere: Secondary | ICD-10-CM | POA: Diagnosis present

## 2012-04-04 DIAGNOSIS — D131 Benign neoplasm of stomach: Secondary | ICD-10-CM

## 2012-04-04 DIAGNOSIS — E1010 Type 1 diabetes mellitus with ketoacidosis without coma: Principal | ICD-10-CM | POA: Diagnosis present

## 2012-04-04 DIAGNOSIS — L8993 Pressure ulcer of unspecified site, stage 3: Secondary | ICD-10-CM | POA: Diagnosis present

## 2012-04-04 DIAGNOSIS — Z9119 Patient's noncompliance with other medical treatment and regimen: Secondary | ICD-10-CM

## 2012-04-04 DIAGNOSIS — R918 Other nonspecific abnormal finding of lung field: Secondary | ICD-10-CM

## 2012-04-04 DIAGNOSIS — E871 Hypo-osmolality and hyponatremia: Secondary | ICD-10-CM | POA: Diagnosis present

## 2012-04-04 DIAGNOSIS — I509 Heart failure, unspecified: Secondary | ICD-10-CM | POA: Diagnosis present

## 2012-04-04 DIAGNOSIS — L03119 Cellulitis of unspecified part of limb: Secondary | ICD-10-CM

## 2012-04-04 DIAGNOSIS — E039 Hypothyroidism, unspecified: Secondary | ICD-10-CM

## 2012-04-04 HISTORY — DX: Type 2 diabetes mellitus with unspecified diabetic retinopathy without macular edema: E11.319

## 2012-04-04 HISTORY — DX: Chronic systolic (congestive) heart failure: I50.22

## 2012-04-04 HISTORY — DX: Non-ST elevation (NSTEMI) myocardial infarction: I21.4

## 2012-04-04 HISTORY — DX: Type 2 diabetes mellitus with diabetic neuropathy, unspecified: E11.40

## 2012-04-04 HISTORY — DX: Acute respiratory failure, unspecified whether with hypoxia or hypercapnia: J96.00

## 2012-04-04 LAB — URINALYSIS, ROUTINE W REFLEX MICROSCOPIC
Glucose, UA: >1000 mg/dL — AB
Ketones, ur: 40 mg/dL — AB
Leukocytes, UA: NEGATIVE
Nitrite: NEGATIVE
Protein, ur: 30 mg/dL — AB
Specific Gravity, Urine: 1.025 (ref 1.005–1.030)
Urobilinogen, UA: 1 mg/dL (ref 0.0–1.0)
pH: 5 (ref 5.0–8.0)

## 2012-04-04 LAB — GLUCOSE, CAPILLARY
Glucose-Capillary: >600 mg/dL (ref 70–99)
Glucose-Capillary: >600 mg/dL (ref 70–99)
Glucose-Capillary: >600 mg/dL (ref 70–99)
Glucose-Capillary: >600 mg/dL (ref 70–99)

## 2012-04-04 LAB — BLOOD GAS, ARTERIAL
Bicarbonate: 5 mEq/L — ABNORMAL LOW (ref 20.0–24.0)
Drawn by: 336861
O2 Content: 3 L/min
pCO2 arterial: 15.9 mmHg — CL (ref 35.0–45.0)
pH, Arterial: 7.122 — CL (ref 7.350–7.450)
pO2, Arterial: 76 mmHg — ABNORMAL LOW (ref 80.0–100.0)

## 2012-04-04 LAB — BASIC METABOLIC PANEL
CO2: <7 mEq/L — CL (ref 19–32)
Calcium: 9.2 mg/dL (ref 8.4–10.5)
Creatinine, Ser: 1.41 mg/dL — ABNORMAL HIGH (ref 0.50–1.35)
GFR calc non Af Amer: 45 mL/min — ABNORMAL LOW (ref >90)
Sodium: 131 mEq/L — ABNORMAL LOW (ref 135–145)

## 2012-04-04 LAB — HEPATIC FUNCTION PANEL
ALT: 7 U/L (ref 0–53)
AST: 11 U/L (ref 0–37)
Albumin: 2.7 g/dL — ABNORMAL LOW (ref 3.5–5.2)
Alkaline Phosphatase: 82 U/L (ref 39–117)
Bilirubin, Direct: 0.1 mg/dL (ref 0.0–0.3)
Indirect Bilirubin: 0 mg/dL — ABNORMAL LOW (ref 0.3–0.9)
Total Bilirubin: 0.1 mg/dL — ABNORMAL LOW (ref 0.3–1.2)
Total Protein: 7.1 g/dL (ref 6.0–8.3)

## 2012-04-04 LAB — CBC WITH DIFFERENTIAL/PLATELET
Basophils Absolute: 0 10*3/uL (ref 0.0–0.1)
Basophils Relative: 0 % (ref 0–1)
Eosinophils Absolute: 0.1 K/uL (ref 0.0–0.7)
Eosinophils Relative: 1 % (ref 0–5)
HCT: 32.2 % — ABNORMAL LOW (ref 39.0–52.0)
Hemoglobin: 9.4 g/dL — ABNORMAL LOW (ref 13.0–17.0)
Lymphocytes Relative: 14 % (ref 12–46)
Lymphs Abs: 3.2 K/uL (ref 0.7–4.0)
MCH: 32.2 pg (ref 26.0–34.0)
MCHC: 29.2 g/dL — ABNORMAL LOW (ref 30.0–36.0)
MCV: 110.3 fL — ABNORMAL HIGH (ref 78.0–100.0)
Monocytes Absolute: 1.5 10*3/uL — ABNORMAL HIGH (ref 0.1–1.0)
Monocytes Relative: 7 % (ref 3–12)
Neutro Abs: 18 10*3/uL — ABNORMAL HIGH (ref 1.7–7.7)
Neutrophils Relative %: 79 % — ABNORMAL HIGH (ref 43–77)
Platelets: 368 10*3/uL (ref 150–400)
RBC: 2.92 MIL/uL — ABNORMAL LOW (ref 4.22–5.81)
RDW: 16 % — ABNORMAL HIGH (ref 11.5–15.5)
WBC: 22.8 10*3/uL — ABNORMAL HIGH (ref 4.0–10.5)

## 2012-04-04 LAB — URINE MICROSCOPIC-ADD ON

## 2012-04-04 LAB — BASIC METABOLIC PANEL WITH GFR
BUN: 28 mg/dL — ABNORMAL HIGH (ref 6–23)
Chloride: 86 meq/L — ABNORMAL LOW (ref 96–112)
GFR calc Af Amer: 52 mL/min — ABNORMAL LOW (ref >90)
Glucose, Bld: 804 mg/dL (ref 70–99)
Potassium: 5 meq/L (ref 3.5–5.1)

## 2012-04-04 LAB — TROPONIN I: Troponin I: 0.43 ng/mL (ref <0.30)

## 2012-04-04 LAB — PROCALCITONIN: Procalcitonin: 0.33 ng/mL

## 2012-04-04 LAB — LACTIC ACID, PLASMA: Lactic Acid, Venous: 4.8 mmol/L — ABNORMAL HIGH (ref 0.5–2.2)

## 2012-04-04 MED ORDER — HEPARIN SODIUM (PORCINE) 5000 UNIT/ML IJ SOLN
5000.0000 [IU] | Freq: Three times a day (TID) | INTRAMUSCULAR | Status: DC
  Administered 2012-04-05: 5000 [IU] via SUBCUTANEOUS
  Filled 2012-04-04 (×6): qty 1

## 2012-04-04 MED ORDER — SODIUM CHLORIDE 0.9 % IV BOLUS (SEPSIS)
1000.0000 mL | Freq: Once | INTRAVENOUS | Status: AC
  Administered 2012-04-04: 1000 mL via INTRAVENOUS

## 2012-04-04 MED ORDER — DEXTROSE 50 % IV SOLN
25.0000 mL | INTRAVENOUS | Status: DC | PRN
  Filled 2012-04-04: qty 50

## 2012-04-04 MED ORDER — VANCOMYCIN HCL IN DEXTROSE 1-5 GM/200ML-% IV SOLN
1000.0000 mg | INTRAVENOUS | Status: DC
  Administered 2012-04-05 – 2012-04-06 (×2): 1000 mg via INTRAVENOUS
  Filled 2012-04-04 (×2): qty 200

## 2012-04-04 MED ORDER — INSULIN REGULAR BOLUS VIA INFUSION: 0.0000 [IU] | Freq: Three times a day (TID) | INTRAVENOUS | Status: DC

## 2012-04-04 MED ORDER — SODIUM CHLORIDE 0.9 % IV BOLUS (SEPSIS)
500.0000 mL | Freq: Once | INTRAVENOUS | Status: AC
  Administered 2012-04-04: 500 mL via INTRAVENOUS

## 2012-04-04 MED ORDER — DEXTROSE-NACL 5-0.45 % IV SOLN: INTRAVENOUS | Status: DC

## 2012-04-04 MED ORDER — SODIUM CHLORIDE 0.9 % IV SOLN
INTRAVENOUS | Status: DC
  Administered 2012-04-04: 5.4 [IU]/h via INTRAVENOUS
  Filled 2012-04-04: qty 1

## 2012-04-04 MED ORDER — SODIUM CHLORIDE 0.9 % IV SOLN
INTRAVENOUS | Status: DC
  Administered 2012-04-05: 19.7 [IU]/h via INTRAVENOUS
  Administered 2012-04-05: 5 [IU]/h via INTRAVENOUS
  Administered 2012-04-05: 02:00:00 via INTRAVENOUS
  Administered 2012-04-06: 4.8 [IU]/h via INTRAVENOUS
  Filled 2012-04-04 (×5): qty 1

## 2012-04-04 MED ORDER — SODIUM CHLORIDE 0.9 % IV SOLN
INTRAVENOUS | Status: AC
  Administered 2012-04-04: 22:00:00 via INTRAVENOUS

## 2012-04-04 MED ORDER — SODIUM CHLORIDE 0.9 % IV SOLN
INTRAVENOUS | Status: DC
  Administered 2012-04-05: 1000 mL via INTRAVENOUS
  Administered 2012-04-05 (×2): via INTRAVENOUS

## 2012-04-04 MED ORDER — PIPERACILLIN-TAZOBACTAM 3.375 G IVPB
3.3750 g | Freq: Three times a day (TID) | INTRAVENOUS | Status: DC
  Administered 2012-04-05 – 2012-04-15 (×32): 3.375 g via INTRAVENOUS
  Filled 2012-04-04 (×32): qty 50

## 2012-04-04 MED ORDER — DEXTROSE 50 % IV SOLN: 25.0000 mL | INTRAVENOUS | Status: DC | PRN

## 2012-04-04 MED ORDER — ONDANSETRON HCL 4 MG/2ML IJ SOLN
4.0000 mg | Freq: Once | INTRAMUSCULAR | Status: AC
  Administered 2012-04-04: 4 mg via INTRAVENOUS
  Filled 2012-04-04: qty 2

## 2012-04-04 MED ORDER — DEXTROSE-NACL 5-0.45 % IV SOLN
INTRAVENOUS | Status: DC | PRN
  Administered 2012-04-05: 1000 mL via INTRAVENOUS

## 2012-04-04 MED ORDER — SODIUM CHLORIDE 0.9 % IV BOLUS (SEPSIS): 1000.0000 mL | Freq: Once | INTRAVENOUS | Status: DC

## 2012-04-04 MED ORDER — SODIUM CHLORIDE 0.9 % IV SOLN: INTRAVENOUS | Status: DC

## 2012-04-04 NOTE — ED Notes (Signed)
Left EJ infiltrated. MD ghim and bedside and confirmed. Lab called and informed pt with co2<7 and cbg 804. md at bedside and informed.

## 2012-04-04 NOTE — ED Notes (Signed)
Per EMS, states sore throat for 2 weeks-N/V today and family states he has not been acting himself-has not been given insulin b/c "he has not eaten since last evening"-EMS states CBG high-pt lethargic, Kussmal breathing, incontinent of urine-answers questions appropriately although EMS states more lethargy since arrival

## 2012-04-04 NOTE — ED Notes (Signed)
md at bedside and spoke with pt's daughter. Asked md if he wanted to assess pt's wounds MD stated he would assess when pt on floor.

## 2012-04-04 NOTE — ED Notes (Signed)
[id number]<AV> Expected date:04/04/12<BR> Expected time: 5:49 PM<BR> Means of arrival:<BR> Comments:<BR> hyperglcemia

## 2012-04-04 NOTE — ED Notes (Signed)
Pt with a troponin of 0.43 md ghim. Informed.

## 2012-04-04 NOTE — ED Notes (Signed)
Pt with bear hugger on d/t low temp. MD informed. Will continue to monitor.

## 2012-04-04 NOTE — ED Notes (Signed)
Called to give report nurse unavailable will call back.  

## 2012-04-04 NOTE — ED Notes (Signed)
RT called and phone given to MD Z for critical abg.

## 2012-04-04 NOTE — Progress Notes (Signed)
ANTIBIOTIC CONSULT NOTE - INITIAL  Pharmacy Consult for Vancomycin and Zosyn Indication: ?infected diabetic ulcer/sacral decub ulcer  No Known Allergies  Patient Measurements:   Adjusted Body Weight:   Vital Signs: Temp: 94.2 F (34.6 C) (08/01 2145) Temp src: Rectal (08/01 2003) BP: 91/34 mmHg (08/01 2145) Pulse Rate: 89  (08/01 2145) Intake/Output from previous day:   Intake/Output from this shift:    Labs:  [facility] 04/04/12 1905  WBC 22.8*  HGB 9.4*  PLT 368  LABCREA --  CREATININE 1.41*   The CrCl is unknown because both a height and weight (above a minimum accepted value) are required for this calculation. No results found for this basename: VANCOTROUGH:2,VANCOPEAK:2,VANCORANDOM:2,GENTTROUGH:2,GENTPEAK:2,GENTRANDOM:2,TOBRATROUGH:2,TOBRAPEAK:2,TOBRARND:2,AMIKACINPEAK:2,AMIKACINTROU:2,AMIKACIN:2, in the last 72 hours   Microbiology: No results found for this or any previous visit (from the past 720 hour(s)).  Medical History: Past Medical History  Diagnosis Date  . Diabetes mellitus   . Cancer   . Thyroid disease   . Anemia   . Hyperlipidemia     Assessment: 27 yoM to be started on Vancomycin and Zosyn for ?infected diabetic ulcer/sacral decub ulcer.  Pt encephalopathic and found to be hyperglycemic, acidotic, and in ARF.  Hypothermic - temperature dropped to 93.3.  WBC 22.8.  Blood, urine cultures pending.   SCr 1.41 (0.67 in March 2013, 1.18 in April, 2013).  Estimated CrCl ~38 ml/min.    Goal of Therapy:  Vancomycin trough level 15-20 mcg/ml  Plan:  1.  Zosyn 3.375g IV q 8 hours (4 hr infusion) 2.  Vancomycin 1 gram IV q 24 hours 3.  F/u renal fxn, cultures, vancomycin trough at steady state, clinical course.    Jawad Wiacek E 04/04/2012,10:18 PM

## 2012-04-04 NOTE — H&P (Signed)
Name: [name] MRN: [id number] DOB: 09-21-1928    LOS: 0  Referring Provider:  EDP Reason for Referral:  DKA  PULMONARY / CRITICAL CARE MEDICINE  The patient is encephalopathic and unable to provide history, which was obtained for available medical records.  HPI:  78 yo diabetic sent to [facility] ED with nausea, vomiting and altered mental status.  Apparently was withholding insulin as he did not feel well for several weeks.  Found to be hyperglycemic / acidotic.  Past Medical History  Diagnosis Date  . Diabetes mellitus   . Cancer   . Thyroid disease   . Anemia   . Hyperlipidemia    Past Surgical History  Procedure Date  . Cataract extraction, bilateral    Prior to Admission medications   Medication Sig Start Date End Date Taking? Authorizing Provider  ferrous sulfate 325 (65 FE) MG tablet Take 325 mg by mouth 2 (two) times daily.    Yes Historical Provider, MD  folic acid (FOLVITE) 1 MG tablet Take 1 mg by mouth daily.   Yes Historical Provider, MD  insulin aspart protamine-insulin aspart (NOVOLOG 70/30) (70-30) 100 UNIT/ML injection Inject 15-18 Units into the skin 2 (two) times daily. 18 units in am, 15 units in pm   Yes Historical Provider, MD  levothyroxine (SYNTHROID, LEVOTHROID) 25 MCG tablet Take 25 mcg by mouth daily.   Yes Historical Provider, MD  levothyroxine (SYNTHROID, LEVOTHROID) 50 MCG tablet Take 50 mcg by mouth daily. Take with 25 mcg tablet . Total dose is 75 mcg.   Yes Historical Provider, MD  ranitidine (ZANTAC) 150 MG tablet Take 150 mg by mouth 2 (two) times daily.   Yes Historical Provider, MD  simvastatin (ZOCOR) 20 MG tablet Take 30 mg by mouth every evening. Take one and a half tablet by mouth at bedtime   Yes Historical Provider, MD  Tamsulosin HCl (FLOMAX) 0.4 MG CAPS Take 0.4 mg by mouth daily after supper.   Yes Historical Provider, MD  vitamin B-12 (CYANOCOBALAMIN) 1000 MCG tablet Take 1,000 mcg by mouth daily.   Yes Historical Provider, MD  vitamin C  (ASCORBIC ACID) 500 MG tablet Take 500 mg by mouth 2 (two) times daily.   Yes Historical Provider, MD   Allergies No Known Allergies  Family History Family History  Problem Relation Age of Onset  . Hypertension     Social History  reports that he quit smoking about 5 years ago. His smoking use included Cigarettes. He smoked .25 packs per day. He has never used smokeless tobacco. He reports that he does not drink alcohol or use illicit drugs.  Review Of Systems:  Unable to provide, but according to medical record had a sore throat for several weeks  Brief patient description:  78 yo diabetic sent to [facility] ED with nausea, vomiting and altered mental status.  Apparently was withholding insulin as he did not feel well for several weeks.  Found to be hyperglycemic / acidotic.  Events Since Admission: 8/1  Admitted with DKA  Current Status:  Vital Signs: Temp:  [93.3 F (34.1 C)-97.7 F (36.5 C)] 93.7 F (34.3 C) (08/01 2052) Pulse Rate:  [89-100] 92  (08/01 2052) Resp:  [17-21] 17  (08/01 2052) BP: (81-100)/(33-69) 91/35 mmHg (08/01 2052) SpO2:  [90 %-100 %] 98 % (08/01 2052)  Physical Examination: General:  Chronically ill appearing Neuro:  Somnolent, arouses to stimulation, confused, nonfocal HEENT:  PERRL, dry membranes Neck:  Supple, no JVD Cardiovascular:  RRR, tachycardic Lungs:  CTAB Abdomen:  Soft, non tender, bowel sounds present  Musculoskeletal:  Moves all extremities Skin:  R foot ulcer, sacral decubitus ulcer  Active Problems:  Hypertension  Diabetes mellitus  Hypothyroidism  Anemia  DKA (diabetic ketoacidoses)  Acute encephalopathy  Dehydration  Acute renal failure  Diabetes with ulcer of foot  Sacral decubitus ulcer  ASSESSMENT AND PLAN  PULMONARY No results found for this basename: PHART:5,PCO2:5,PCO2ART:5,PO2ART:5,HCO3:5,O2SAT:5 in the last 168 hours Ventilator Settings:   CXR:  8/1 >>> No overt infiltrates ETT:  NA  A:  No active issues. P:     Supplemental oxygen to keep SpO2 > 92%  CARDIOVASCULAR  Lab 04/04/12 1925  TROPONINI 0.43*  LATICACIDVEN 4.8*  PROBNP --   ECG:  8/1 >>> NSR, RBBB, LAFB (present prior to this admission) Lines: NA  A: Hypotension secondary to severe dehydration related to glucosuria.  Marginally elevated cardiac enzymes on the background of acute renal failure.  No evidence of ischemia. P:  IVF No indications for CVL / vasopressors Trend cardiac enzymes / lactate  RENAL  Lab 04/04/12 1905  NA 131*  K 5.0  CL 86*  CO2 <7*  BUN 28*  CREATININE 1.41*  CALCIUM 9.2  MG --  PHOS --   Intake/Output    None    Foley:  8/1 >>>  A:  Severe dehydration.  Acute renal failure.  Mild hyponatremia.  Metabolic acidosis. P:   IVF per DKA protocol ABG  GASTROINTESTINAL No results found for this basename: AST:5,ALT:5,ALKPHOS:5,BILITOT:5,PROT:5,ALBUMIN:5 in the last 168 hours  A:  No active issues. P:   NPO secondary to altered mental status  HEMATOLOGIC  Lab 04/04/12 1905  HGB 9.4*  HCT 32.2*  PLT 368  INR --  APTT --   A:  Anemia.  No overt hemorrhage. P:  Trend CBC  INFECTIOUS  Lab 04/04/12 1905  WBC 22.8*  PROCALCITON 0.33   Cultures: 8/1  Blood >>>  Antibiotics: Vancomycin 8/1 >>> Zosyn 8/1 >>>  A:  Possible infected diabetic ulcer / sacral decub ulcer.  Procalcitonin reassuring. Sore throat? P:   Antibiotics / cultures as above Reevaluate the need for antibiotics in AM Trend CBC Rapid strep screen Wound care consult   ENDOCRINE  Lab 04/04/12 1931 04/04/12 1840 03/29/12 1509  GLUCAP >600* >600* 341*   A:  DM.  DKA secondary to noncompliance (history of noncompliance).  Hypothyroidism. P:   DKA protocol Synthroid when able to take PO  NEUROLOGIC  Head CT:  8/1 >>> nad  A:  Acute encephalopathy secondary to DKA. P:   Monitor   BEST PRACTICE / DISPOSITION Level of Care:  ICU Primary Service:  PCCM Consultants:  None Code Status:  Full Diet:   NPO DVT Px:  Heparin  GI Px:  Not indicated Skin Integrity:  As above - wound care consulted Social / Family:  Daughter updated at bedside  The patient is critically ill with multiple organ systems failure and requires high complexity decision making for assessment and support, frequent evaluation and titration of therapies, application of advanced monitoring technologies and extensive interpretation of multiple databases. Critical Care Time devoted to patient care services described in this note is 40 minutes.  Lonia Farber, M.D. Pulmonary and Critical Care Medicine [facility] Pager: [phone]  04/04/2012, 9:27 PM

## 2012-04-04 NOTE — ED Notes (Signed)
Pt on o2. Insulin drip started. Pt with granddaughter at bedside and updated. Will continue to monitor

## 2012-04-04 NOTE — ED Notes (Signed)
MD Zubelevitskiy, in ED stated do not start code sepsis

## 2012-04-04 NOTE — ED Provider Notes (Signed)
History     CSN: [id number]  Arrival date & time 04/04/12  1801   First MD Initiated Contact with Patient 04/04/12 1844      Chief Complaint  Patient presents with  . Hyperglycemia    (Consider location/radiation/quality/duration/timing/severity/associated sxs/prior treatment) HPI Comments: Level 5 caveat due to altered mentation and urgent need for intervention.  Patient is supposedly sent here due to to nausea vomiting and altered mental status. Patient has a history of diabetes. Apparently family has been withholding his insulin because of his vomiting and not eating. Patient apparently did complain of a sore throat a few weeks ago according to EMS. Patient seems listless and somewhat lethargic although he will respond and answer a few questions now and then. Past medical history and medications implies that he is an insulin-dependent diabetic, has a history of hypothyroidism, has reflux and high cholesterol as well as BPH.  The history is provided by the EMS personnel.    Past Medical History  Diagnosis Date  . Diabetes mellitus   . Cancer   . Thyroid disease   . Anemia   . Hyperlipidemia     Past Surgical History  Procedure Date  . Cataract extraction, bilateral     Family History  Problem Relation Age of Onset  . Hypertension      History  Substance Use Topics  . Smoking status: Former Smoker -- 0.2 packs/day    Types: Cigarettes    Quit date: 11/22/2006  . Smokeless tobacco: Never Used  . Alcohol Use: No      Review of Systems  Unable to perform ROS: Unstable vital signs    Allergies  Review of patient's allergies indicates no known allergies.  Home Medications   Current Outpatient Rx  Name Route Sig Dispense Refill  . FERROUS SULFATE 325 (65 FE) MG PO TABS Oral Take 325 mg by mouth 2 (two) times daily.     Marland Kitchen FOLIC ACID 1 MG PO TABS Oral Take 1 mg by mouth daily.    . INSULIN ASPART PROT & ASPART (70-30) 100 UNIT/ML [place] SUSP Subcutaneous Inject  15-18 Units into the skin 2 (two) times daily. 18 units in am, 15 units in pm    . LEVOTHYROXINE SODIUM 25 MCG PO TABS Oral Take 25 mcg by mouth daily.    Marland Kitchen LEVOTHYROXINE SODIUM 50 MCG PO TABS Oral Take 50 mcg by mouth daily. Take with 25 mcg tablet . Total dose is 75 mcg.    Marland Kitchen RANITIDINE HCL 150 MG PO TABS Oral Take 150 mg by mouth 2 (two) times daily.    Marland Kitchen SIMVASTATIN 20 MG PO TABS Oral Take 30 mg by mouth every evening. Take one and a half tablet by mouth at bedtime    . TAMSULOSIN HCL 0.4 MG PO CAPS Oral Take 0.4 mg by mouth daily after supper.    Marland Kitchen VITAMIN B-12 1000 MCG PO TABS Oral Take 1,000 mcg by mouth daily.    Marland Kitchen VITAMIN C 500 MG PO TABS Oral Take 500 mg by mouth 2 (two) times daily.      BP 91/35  Pulse 92  Temp 93.7 F (34.3 C) (Rectal)  Resp 17  SpO2 98%  Physical Exam  Nursing note and vitals reviewed. Constitutional: He appears well-developed and well-nourished. He appears listless. He appears ill. No distress.       Fruity odor  HENT:  Head: Normocephalic.  Neck: Normal range of motion. Neck supple.  Cardiovascular: Normal rate and regular  rhythm.   Pulmonary/Chest: Effort normal and breath sounds normal.  Abdominal: Soft. He exhibits no distension. There is no tenderness.  Neurological: He appears listless.  Skin: Skin is warm. No rash noted. He is not diaphoretic. No pallor.    ED Course  Angiocath insertion Date/Time: 04/04/2012 7:27 PM Performed by: Lear Ng Authorized by: Lear Ng Consent: The procedure was performed in an emergent situation. Patient identity confirmed: arm band Time out: Immediately prior to procedure a "time out" was called to verify the correct patient, procedure, equipment, support staff and site/side marked as required. Preparation: Patient was prepped and draped in the usual sterile fashion. Local anesthesia used: no Patient sedated: no Patient tolerance: Patient tolerated the procedure well with no immediate  complications. Comments: 18 gauge angiocath placed into left EJ by me   (including critical care time)  CRITICAL CARE Performed by: Lear Ng.   Total critical care time: 40 min  Critical care time was exclusive of separately billable procedures and treating other patients.  Critical care was necessary to treat or prevent imminent or life-threatening deterioration.  Critical care was time spent personally by me on the following activities: development of treatment plan with patient and/or surrogate as well as nursing, discussions with consultants, evaluation of patient's response to treatment, examination of patient, obtaining history from patient or surrogate, ordering and performing treatments and interventions, ordering and review of laboratory studies, ordering and review of radiographic studies, pulse oximetry and re-evaluation of patient's condition.   Labs Reviewed  GLUCOSE, CAPILLARY - Abnormal; Notable for the following:    Glucose-Capillary >600 (*)     All other components within normal limits  CBC WITH DIFFERENTIAL - Abnormal; Notable for the following:    WBC 22.8 (*)     RBC 2.92 (*)     Hemoglobin 9.4 (*)     HCT 32.2 (*)     MCV 110.3 (*)     MCHC 29.2 (*)     RDW 16.0 (*)     Neutrophils Relative 79 (*)     Neutro Abs 18.0 (*)     Monocytes Absolute 1.5 (*)     All other components within normal limits  BASIC METABOLIC PANEL - Abnormal; Notable for the following:    Sodium 131 (*)     Chloride 86 (*)     CO2 <7 (*)     Glucose, Bld 804 (*)     BUN 28 (*)     Creatinine, Ser 1.41 (*)     GFR calc non Af Amer 45 (*)     GFR calc Af Amer 52 (*)     All other components within normal limits  TROPONIN I - Abnormal; Notable for the following:    Troponin I 0.43 (*)     All other components within normal limits  LACTIC ACID, PLASMA - Abnormal; Notable for the following:    Lactic Acid, Venous 4.8 (*)     All other components within normal limits    URINALYSIS, ROUTINE W REFLEX MICROSCOPIC - Abnormal; Notable for the following:    APPearance CLOUDY (*)     Glucose, UA >1000 (*)     Hgb urine dipstick TRACE (*)     Bilirubin Urine MODERATE (*)     Ketones, ur 40 (*)     Protein, ur 30 (*)     All other components within normal limits  GLUCOSE, CAPILLARY - Abnormal; Notable for the following:    Glucose-Capillary >  600 (*)     All other components within normal limits  URINE MICROSCOPIC-ADD ON - Abnormal; Notable for the following:    Bacteria, UA FEW (*)     All other components within normal limits  PROCALCITONIN  URINE CULTURE  CULTURE, BLOOD (ROUTINE X 2)  CULTURE, BLOOD (ROUTINE X 2)  HEPATIC FUNCTION PANEL   Dg Chest Port 1 View  04/04/2012  *RADIOLOGY REPORT*  Clinical Data: Rhonchi.  Altered mental status.  Hyperglycemia.  PORTABLE CHEST - 1 VIEW  Comparison: 11/21/2011  Findings: Heart size is mildly enlarged.  There are mild changes of interstitial edema.  No focal consolidations or pleural effusions are identified.  IMPRESSION: Mild cardiomegaly and interstitial edema.  Original Report Authenticated By: Patterson Hammersmith, M.D.     1. DKA (diabetic ketoacidoses)   2. Hypotension   3. Hypothermia     ECG at time 19:51 shows NSR at rate 89, left axis deviation. Right bundle branch block and left anterior fascicular block are noted. No acute ST or T wave abnormalities are noted. No significant change compared to EKG from 10/19/2011 except that rate is faster.  RA sat is 100% which I interpret to be normal.    9:09 PM Spoke to ICU who will see pt.  Bicarb is 7, mild renal insuff, normal K+, very dehydrated with sig leukocytosis.  No obv source of infection so far.  BP is marginally improving with IVF boluses.  Glucosestabilizer insulin ordered.  Bear Hugger for hypothermia is applied as well.  Head CT is pending.     MDM  The patient is listless, however breathing spontaneously and will arouse to loud voice. He does  tend to close his eyes and seemed to fall back asleep if not stimulated. He will follow simple commands on occasion. Given his diabetic along with his clinical symptoms of dehydration nausea and vomiting, and clinically concerned for diabetic ketoacidosis. His blood pressure is marginal at 100/30 suggested that he is quite dehydrated. My plan is to bolus him with IV fluids, start IV insulin by glucose stabilizer and patient will likely need admission to step down.        Gavin Pound. Oletta Lamas, MD 04/04/12 2110

## 2012-04-05 ENCOUNTER — Encounter (HOSPITAL_BASED_OUTPATIENT_CLINIC_OR_DEPARTMENT_OTHER): Payer: Medicare Other | Attending: General Surgery

## 2012-04-05 ENCOUNTER — Encounter (HOSPITAL_COMMUNITY): Payer: Self-pay | Admitting: Physician Assistant

## 2012-04-05 ENCOUNTER — Other Ambulatory Visit (HOSPITAL_COMMUNITY): Payer: Medicare Other

## 2012-04-05 ENCOUNTER — Inpatient Hospital Stay (HOSPITAL_COMMUNITY): Payer: Medicare Other

## 2012-04-05 DIAGNOSIS — R6521 Severe sepsis with septic shock: Secondary | ICD-10-CM | POA: Diagnosis present

## 2012-04-05 DIAGNOSIS — L03119 Cellulitis of unspecified part of limb: Secondary | ICD-10-CM

## 2012-04-05 DIAGNOSIS — A419 Sepsis, unspecified organism: Secondary | ICD-10-CM | POA: Diagnosis present

## 2012-04-05 DIAGNOSIS — I214 Non-ST elevation (NSTEMI) myocardial infarction: Secondary | ICD-10-CM

## 2012-04-05 DIAGNOSIS — I519 Heart disease, unspecified: Secondary | ICD-10-CM

## 2012-04-05 DIAGNOSIS — E876 Hypokalemia: Secondary | ICD-10-CM

## 2012-04-05 LAB — PROTIME-INR: Prothrombin Time: 14.9 seconds (ref 11.6–15.2)

## 2012-04-05 LAB — GLUCOSE, CAPILLARY
Glucose-Capillary: 116 mg/dL — ABNORMAL HIGH (ref 70–99)
Glucose-Capillary: 152 mg/dL — ABNORMAL HIGH (ref 70–99)
Glucose-Capillary: 166 mg/dL — ABNORMAL HIGH (ref 70–99)
Glucose-Capillary: 204 mg/dL — ABNORMAL HIGH (ref 70–99)
Glucose-Capillary: 222 mg/dL — ABNORMAL HIGH (ref 70–99)
Glucose-Capillary: 253 mg/dL — ABNORMAL HIGH (ref 70–99)
Glucose-Capillary: 306 mg/dL — ABNORMAL HIGH (ref 70–99)
Glucose-Capillary: 323 mg/dL — ABNORMAL HIGH (ref 70–99)
Glucose-Capillary: 331 mg/dL — ABNORMAL HIGH (ref 70–99)
Glucose-Capillary: 370 mg/dL — ABNORMAL HIGH (ref 70–99)
Glucose-Capillary: 415 mg/dL — ABNORMAL HIGH (ref 70–99)
Glucose-Capillary: 431 mg/dL — ABNORMAL HIGH (ref 70–99)
Glucose-Capillary: 456 mg/dL — ABNORMAL HIGH (ref 70–99)
Glucose-Capillary: 468 mg/dL — ABNORMAL HIGH (ref 70–99)
Glucose-Capillary: 52 mg/dL — ABNORMAL LOW (ref 70–99)
Glucose-Capillary: 556 mg/dL (ref 70–99)
Glucose-Capillary: 73 mg/dL (ref 70–99)
Glucose-Capillary: 87 mg/dL (ref 70–99)
Glucose-Capillary: 88 mg/dL (ref 70–99)
Glucose-Capillary: >600 mg/dL (ref 70–99)
Glucose-Capillary: >600 mg/dL (ref 70–99)
Glucose-Capillary: >600 mg/dL (ref 70–99)
Glucose-Capillary: >600 mg/dL (ref 70–99)
Glucose-Capillary: >600 mg/dL (ref 70–99)
Glucose-Capillary: >600 mg/dL (ref 70–99)

## 2012-04-05 LAB — BASIC METABOLIC PANEL
BUN: 27 mg/dL — ABNORMAL HIGH (ref 6–23)
CO2: 17 mEq/L — ABNORMAL LOW (ref 19–32)
CO2: 7 mEq/L — CL (ref 19–32)
Calcium: 7.6 mg/dL — ABNORMAL LOW (ref 8.4–10.5)
Calcium: 7.8 mg/dL — ABNORMAL LOW (ref 8.4–10.5)
Chloride: 108 mEq/L (ref 96–112)
Chloride: 109 mEq/L (ref 96–112)
Chloride: 111 mEq/L (ref 96–112)
Creatinine, Ser: 1.31 mg/dL (ref 0.50–1.35)
Creatinine, Ser: 1.41 mg/dL — ABNORMAL HIGH (ref 0.50–1.35)
GFR calc Af Amer: 57 mL/min — ABNORMAL LOW (ref >90)
GFR calc Af Amer: 57 mL/min — ABNORMAL LOW (ref >90)
GFR calc Af Amer: 64 mL/min — ABNORMAL LOW (ref >90)
GFR calc Af Amer: 70 mL/min — ABNORMAL LOW (ref >90)
GFR calc non Af Amer: 45 mL/min — ABNORMAL LOW (ref >90)
GFR calc non Af Amer: 49 mL/min — ABNORMAL LOW (ref >90)
GFR calc non Af Amer: 49 mL/min — ABNORMAL LOW (ref >90)
GFR calc non Af Amer: 55 mL/min — ABNORMAL LOW (ref >90)
Glucose, Bld: 639 mg/dL (ref 70–99)
Glucose, Bld: 410 mg/dL — ABNORMAL HIGH (ref 70–99)
Potassium: 3.4 mEq/L — ABNORMAL LOW (ref 3.5–5.1)
Potassium: 3.2 mEq/L — ABNORMAL LOW (ref 3.5–5.1)
Potassium: 3.4 mEq/L — ABNORMAL LOW (ref 3.5–5.1)
Sodium: 134 mEq/L — ABNORMAL LOW (ref 135–145)
Sodium: 135 mEq/L (ref 135–145)
Sodium: 138 mEq/L (ref 135–145)
Sodium: 139 mEq/L (ref 135–145)

## 2012-04-05 LAB — CBC
Hemoglobin: 8 g/dL — ABNORMAL LOW (ref 13.0–17.0)
Hemoglobin: 8.1 g/dL — ABNORMAL LOW (ref 13.0–17.0)
MCH: 31.8 pg (ref 26.0–34.0)
MCHC: 30.3 g/dL (ref 30.0–36.0)
MCV: 100.4 fL — ABNORMAL HIGH (ref 78.0–100.0)
Platelets: 290 10*3/uL (ref 150–400)
RBC: 2.55 MIL/uL — ABNORMAL LOW (ref 4.22–5.81)
RDW: 15.4 % (ref 11.5–15.5)
WBC: 17.5 10*3/uL — ABNORMAL HIGH (ref 4.0–10.5)
WBC: 4.7 10*3/uL (ref 4.0–10.5)

## 2012-04-05 LAB — BLOOD GAS, ARTERIAL
Acid-base deficit: 12.9 mmol/L — ABNORMAL HIGH (ref 0.0–2.0)
Bicarbonate: 11.2 mEq/L — ABNORMAL LOW (ref 20.0–24.0)
Drawn by: 232811
FIO2: 1 %
RATE: 30 resp/min
pCO2 arterial: 20.6 mmHg — ABNORMAL LOW (ref 35.0–45.0)
pO2, Arterial: 250 mmHg — ABNORMAL HIGH (ref 80.0–100.0)

## 2012-04-05 LAB — CARDIAC PANEL(CRET KIN+CKTOT+MB+TROPI)
Relative Index: 5.8 — ABNORMAL HIGH (ref 0.0–2.5)
Relative Index: 5.8 — ABNORMAL HIGH (ref 0.0–2.5)
Relative Index: 7 — ABNORMAL HIGH (ref 0.0–2.5)
Total CK: 118 U/L (ref 7–232)
Total CK: 241 U/L — ABNORMAL HIGH (ref 7–232)
Total CK: 348 U/L — ABNORMAL HIGH (ref 7–232)
Troponin I: 1.76 ng/mL (ref <0.30)

## 2012-04-05 LAB — URINE CULTURE
Colony Count: NO GROWTH
Culture: NO GROWTH

## 2012-04-05 LAB — APTT: aPTT: 34 seconds (ref 24–37)

## 2012-04-05 LAB — RAPID STREP SCREEN (MED CTR MEBANE ONLY): Streptococcus, Group A Screen (Direct): NEGATIVE

## 2012-04-05 LAB — HEPARIN LEVEL (UNFRACTIONATED): Heparin Unfractionated: 0.61 IU/mL (ref 0.30–0.70)

## 2012-04-05 MED ORDER — FENTANYL CITRATE 0.05 MG/ML IJ SOLN
INTRAMUSCULAR | Status: AC
  Administered 2012-04-05: 50 ug
  Filled 2012-04-05: qty 2

## 2012-04-05 MED ORDER — POTASSIUM CHLORIDE 10 MEQ/100ML IV SOLN
10.0000 meq | INTRAVENOUS | Status: AC
  Administered 2012-04-05 (×2): 10 meq via INTRAVENOUS
  Filled 2012-04-05: qty 200

## 2012-04-05 MED ORDER — SUCCINYLCHOLINE CHLORIDE 20 MG/ML IJ SOLN
INTRAMUSCULAR | Status: AC
  Filled 2012-04-05: qty 10

## 2012-04-05 MED ORDER — LIDOCAINE HCL (CARDIAC) 20 MG/ML IV SOLN
INTRAVENOUS | Status: AC
  Filled 2012-04-05: qty 5

## 2012-04-05 MED ORDER — HEPARIN BOLUS VIA INFUSION
2500.0000 [IU] | Freq: Once | INTRAVENOUS | Status: AC
  Administered 2012-04-05: 2500 [IU] via INTRAVENOUS
  Filled 2012-04-05: qty 2500

## 2012-04-05 MED ORDER — POTASSIUM CHLORIDE 10 MEQ/100ML IV SOLN
10.0000 meq | INTRAVENOUS | Status: AC
  Administered 2012-04-05 (×4): 10 meq via INTRAVENOUS
  Filled 2012-04-05: qty 400

## 2012-04-05 MED ORDER — BIOTENE DRY MOUTH MT LIQD
15.0000 mL | Freq: Four times a day (QID) | OROMUCOSAL | Status: DC
  Administered 2012-04-05 – 2012-04-26 (×83): 15 mL via OROMUCOSAL

## 2012-04-05 MED ORDER — SODIUM CHLORIDE 0.9 % IV BOLUS (SEPSIS)
500.0000 mL | Freq: Once | INTRAVENOUS | Status: AC
  Administered 2012-04-05: 500 mL via INTRAVENOUS

## 2012-04-05 MED ORDER — SODIUM CHLORIDE 0.9 % IV SOLN
1.0000 mg/h | INTRAVENOUS | Status: DC
  Administered 2012-04-05: 2 mg/h via INTRAVENOUS
  Filled 2012-04-05: qty 10

## 2012-04-05 MED ORDER — HEPARIN (PORCINE) IN NACL 100-0.45 UNIT/ML-% IJ SOLN
900.0000 [IU]/h | INTRAMUSCULAR | Status: DC
  Administered 2012-04-05: 800 [IU]/h via INTRAVENOUS
  Administered 2012-04-06: 900 [IU]/h via INTRAVENOUS
  Filled 2012-04-05 (×2): qty 250

## 2012-04-05 MED ORDER — PANTOPRAZOLE SODIUM 40 MG IV SOLR
40.0000 mg | INTRAVENOUS | Status: DC
  Administered 2012-04-05 – 2012-04-16 (×12): 40 mg via INTRAVENOUS
  Filled 2012-04-05 (×14): qty 40

## 2012-04-05 MED ORDER — PHENYLEPHRINE HCL 10 MG/ML IJ SOLN
30.0000 ug/min | INTRAVENOUS | Status: DC
  Administered 2012-04-05: 100 ug/min via INTRAVENOUS
  Administered 2012-04-05: 80 ug/min via INTRAVENOUS
  Administered 2012-04-06: 25 ug/min via INTRAVENOUS
  Filled 2012-04-05 (×4): qty 4

## 2012-04-05 MED ORDER — PHENYLEPHRINE HCL 10 MG/ML IJ SOLN
30.0000 ug/min | INTRAVENOUS | Status: DC
  Administered 2012-04-05: 40 ug/min via INTRAVENOUS
  Filled 2012-04-05: qty 1

## 2012-04-05 MED ORDER — SODIUM CHLORIDE 0.9 % IV SOLN
25.0000 ug/h | INTRAVENOUS | Status: DC
  Administered 2012-04-05: 25 ug/h via INTRAVENOUS
  Filled 2012-04-05: qty 50

## 2012-04-05 MED ORDER — ETOMIDATE 2 MG/ML IV SOLN
INTRAVENOUS | Status: AC
  Administered 2012-04-05: 20 mg
  Filled 2012-04-05: qty 20

## 2012-04-05 MED ORDER — MIDAZOLAM HCL 5 MG/ML IJ SOLN
INTRAMUSCULAR | Status: AC
  Administered 2012-04-05: 2.5 mg
  Filled 2012-04-05: qty 1

## 2012-04-05 MED ORDER — CHLORHEXIDINE GLUCONATE 0.12 % MT SOLN
15.0000 mL | Freq: Two times a day (BID) | OROMUCOSAL | Status: DC
  Administered 2012-04-05 – 2012-04-26 (×42): 15 mL via OROMUCOSAL
  Filled 2012-04-05 (×47): qty 15

## 2012-04-05 MED ORDER — BIOTENE DRY MOUTH MT LIQD: 15.0000 mL | Freq: Two times a day (BID) | OROMUCOSAL | Status: DC

## 2012-04-05 MED ORDER — COLLAGENASE 250 UNIT/GM EX OINT
TOPICAL_OINTMENT | Freq: Every day | CUTANEOUS | Status: DC
  Administered 2012-04-06 – 2012-04-07 (×2): 1 via TOPICAL
  Administered 2012-04-08 – 2012-04-09 (×2): via TOPICAL
  Administered 2012-04-10 – 2012-04-11 (×2): 1 via TOPICAL
  Administered 2012-04-12 – 2012-04-13 (×2): via TOPICAL
  Administered 2012-04-14: 1 via TOPICAL
  Administered 2012-04-15 – 2012-04-26 (×12): via TOPICAL
  Filled 2012-04-05 (×2): qty 30

## 2012-04-05 MED ORDER — ROCURONIUM BROMIDE 50 MG/5ML IV SOLN
INTRAVENOUS | Status: AC
  Filled 2012-04-05: qty 2

## 2012-04-05 MED ORDER — ASPIRIN 300 MG RE SUPP
300.0000 mg | Freq: Every day | RECTAL | Status: DC
  Administered 2012-04-05: 300 mg via RECTAL
  Filled 2012-04-05 (×3): qty 1

## 2012-04-05 NOTE — Progress Notes (Signed)
CARE MANAGEMENT NOTE 04/05/2012  Patient:  [name],[name]   Account Number:  [id number]  Date Initiated:  04/05/2012  Documentation initiated by:  DAVIS,RHONDA  Subjective/Objective Assessment:   patient with known history of dka, not taking insulin presented with bld glucose over800, required intubation.     Action/Plan:   from home, very noncompliant with lifestyle and meds   Anticipated DC Date:  04/08/2012   Anticipated DC Plan:  HOME/SELF CARE  In-house referral  NA      DC Planning Services  NA      [facility] Choice  NA   Choice offered to / List presented to:  NA   DME arranged  NA      DME agency  NA     HH arranged  NA      HH agency  NA   Status of service:  In process, will continue to follow Medicare Important Message given?  NA - LOS <3 / Initial given by admissions (If response is "NO", the following Medicare IM given date fields will be blank) Date Medicare IM given:   Date Additional Medicare IM given:    Discharge Disposition:    Per UR Regulation:  Reviewed for med. necessity/level of care/duration of stay  If discussed at Long Length of Stay Meetings, dates discussed:    Comments:  [id number]/Rhonda Earlene Plater, RN, BSN, CCM: CHART REVIEWED AND UPDATED. NO DISCHARGE NEEDS PRESENT AT THIS TIME. CASE MANAGEMENT [phone]

## 2012-04-05 NOTE — Progress Notes (Addendum)
INITIAL ADULT NUTRITION ASSESSMENT Date: 04/05/2012   Time: 10:56 AM Reason for Assessment: Mechanical Ventilation  ASSESSMENT: Male 78 y.o.  Dx: DKA  Hx:  Past Medical History  Diagnosis Date  . Diabetes mellitus   . Cancer   . Thyroid disease   . Anemia   . Hyperlipidemia     Related Meds:  Scheduled Meds:   . antiseptic oral rinse  15 mL Mouth Rinse QID  . aspirin  300 mg Rectal Daily  . chlorhexidine  15 mL Mouth Rinse BID  . etomidate      . fentaNYL      . heparin  2,500 Units Intravenous Once  . lidocaine (cardiac) 100 mg/59ml      . midazolam      . ondansetron (ZOFRAN) IV  4 mg Intravenous Once  . pantoprazole (PROTONIX) IV  40 mg Intravenous Q24H  . piperacillin-tazobactam (ZOSYN)  IV  3.375 g Intravenous Q8H  . potassium chloride  10 mEq Intravenous Q1 Hr x 4  . rocuronium      . sodium chloride  1,000 mL Intravenous Once  . sodium chloride  500 mL Intravenous Once  . succinylcholine      . vancomycin  1,000 mg Intravenous Q24H  . DISCONTD: antiseptic oral rinse  15 mL Mouth Rinse q12n4p  . DISCONTD: heparin subcutaneous  5,000 Units Subcutaneous Q8H  . DISCONTD: insulin regular  0-10 Units Intravenous TID WC  . DISCONTD: sodium chloride  1,000 mL Intravenous Once   Continuous Infusions:   . sodium chloride 999 mL/hr at 04/04/12 2149  . sodium chloride 150 mL/hr at 04/05/12 0310  . dextrose 5 % and 0.45% NaCl    . fentaNYL infusion INTRAVENOUS 50 mcg/hr (04/05/12 0900)  . heparin 800 Units/hr (04/05/12 1011)  . insulin (NOVOLIN-R) infusion 15.5 mL/hr at 04/05/12 1037  . midazolam (VERSED) infusion Stopped (04/05/12 0800)  . phenylephrine (NEO-SYNEPHRINE) Adult infusion 100 mcg/min (04/05/12 1000)  . DISCONTD: sodium chloride    . DISCONTD: dextrose 5 % and 0.45% NaCl    . DISCONTD: insulin (NOVOLIN-R) infusion 10.8 Units/hr (04/04/12 2043)  . DISCONTD: phenylephrine (NEO-SYNEPHRINE) Adult infusion 80 mcg/min (04/05/12 0430)   PRN Meds:.dextrose 5 %  and 0.45% NaCl, dextrose, DISCONTD: dextrose   Ht: 6' (182.9 cm)  Wt: 147 lb 11.3 oz (67 kg)  Ideal Wt: 81 kg % Ideal Wt: 82.5% Wt Readings from Last 10 Encounters:  04/04/12 147 lb 11.3 oz (67 kg)  11/22/11 138 lb 0.1 oz (62.6 kg)  10/20/11 138 lb (62.596 kg)    Body mass index is 20.03 kg/(m^2). (WNL)  Food/Nutrition Related Hx: No family present at time of RD visit. Patient intubated on mechanical ventilation. Noted patient admit with nausea, vomiting and altered mental status. Discussed patient in rounds. Patient with multiple decubitus ulcers. RN reported patient is a non-compliant diabetic.   Labs:  CMP     Component Value Date/Time   NA 138 04/05/2012 0945   K 3.2* 04/05/2012 0945   CL 108 04/05/2012 0945   CO2 16* 04/05/2012 0945   GLUCOSE 410* 04/05/2012 0945   BUN 25* 04/05/2012 0945   CREATININE 1.20 04/05/2012 0945   CALCIUM 7.7* 04/05/2012 0945   PROT 7.1 04/04/2012 1925   ALBUMIN 2.7* 04/04/2012 1925   AST 11 04/04/2012 1925   ALT 7 04/04/2012 1925   ALKPHOS 82 04/04/2012 1925   BILITOT 0.1* 04/04/2012 1925   GFRNONAA 55* 04/05/2012 0945   GFRAA 64* 04/05/2012 0945  Intake/Output Summary (Last 24 hours) at 04/05/12 1058 Last data filed at 04/05/12 1037  Gross per 24 hour  Intake 3252.06 ml  Output    610 ml  Net 2642.06 ml     Diet Order: NPO  Supplements/Tube Feeding: none at this time  IVF:    sodium chloride Last Rate: 999 mL/hr at 04/04/12 2149  sodium chloride Last Rate: 150 mL/hr at 04/05/12 0310  dextrose 5 % and 0.45% NaCl   fentaNYL infusion INTRAVENOUS Last Rate: 50 mcg/hr (04/05/12 0900)  heparin Last Rate: 800 Units/hr (04/05/12 1011)  insulin (NOVOLIN-R) infusion Last Rate: 15.5 mL/hr at 04/05/12 1037  midazolam (VERSED) infusion Last Rate: Stopped (04/05/12 0800)  phenylephrine (NEO-SYNEPHRINE) Adult infusion Last Rate: 100 mcg/min (04/05/12 1000)  DISCONTD: sodium chloride   DISCONTD: dextrose 5 % and 0.45% NaCl   DISCONTD: insulin (NOVOLIN-R) infusion  Last Rate: 10.8 Units/hr (04/04/12 2043)  DISCONTD: phenylephrine (NEO-SYNEPHRINE) Adult infusion Last Rate: 80 mcg/min (04/05/12 0430)    Estimated Nutritional Needs:   Kcal: 1670-2000 Protein: 80-100 grams Fluid: 1 ml per kcal intake  NUTRITION DIAGNOSIS: -Inadequate oral intake (NI-2.1).  Status: Ongoing  RELATED TO: inability to eay  AS EVIDENCE BY: NPO on mechanical ventilation  MONITORING/EVALUATION(Goals): Diet advancement/ tolerance, weights, labs, extubation. 1. Diet advancement as medically able to meet > 90% of estimated energy needs. TF initiation if patient remains intubated.   EDUCATION NEEDS: -No education needs identified at this time  INTERVENTION: 1. Recommend diet advancement as medically able. If patient remains intubated, recommend initiate TF of Jevity 1.2 @ 20 ml/hr and increase by 10 ml every 4 hours to a goal rate of 55 ml/hr plus 2 packets Prostat daily. Provides 1784 kcal, 103 grams of protein, and 1065 ml free water. (Meets 100% of estimated calorie and protein needs).   Dietitian [phone]  DOCUMENTATION CODES Per approved criteria  -Not Applicable    Iven Finn [facility] 04/05/2012, 10:56 AM

## 2012-04-05 NOTE — Progress Notes (Addendum)
ANTICOAGULATION CONSULT NOTE - Initial Consult  Pharmacy Consult for Heparin Indication: Rule out ACS  No Known Allergies  Patient Measurements: Height: 6' (182.9 cm) Weight: 147 lb 11.3 oz (67 kg) IBW/kg (Calculated) : 77.6  Heparin Dosing Weight: 67kg  Vital Signs: Temp: 98.2 F (36.8 C) (08/02 0800) Temp src: Core (Comment) (08/01 2300) BP: 107/51 mmHg (08/02 0800) Pulse Rate: 74  (08/02 0800)  Labs:  Basename 04/05/12 0504 04/05/12 0500 04/05/12 0215 04/04/12 2342 04/04/12 1925 04/04/12 1905  HGB -- 8.1* -- 8.0* -- --  HCT -- 25.6* -- 26.4* -- 32.2*  PLT -- 290 -- 313 -- 368  APTT -- -- -- -- -- --  LABPROT -- -- -- -- -- --  INR -- -- -- -- -- --  HEPARINUNFRC -- -- -- -- -- --  CREATININE -- 1.31 1.32 1.41* -- --  CKTOTAL 241* -- -- 118 -- --  CKMB 14.0* -- -- 6.8* -- --  TROPONINI 6.44* -- -- 1.76* 0.43* --    Estimated Creatinine Clearance: 41.9 ml/min (by C-G formula based on Cr of 1.31).   Medical History: Past Medical History  Diagnosis Date  . Diabetes mellitus   . Cancer   . Thyroid disease   . Anemia   . Hyperlipidemia     Medications:  Scheduled:    . antiseptic oral rinse  15 mL Mouth Rinse QID  . aspirin  300 mg Rectal Daily  . chlorhexidine  15 mL Mouth Rinse BID  . etomidate      . fentaNYL      . lidocaine (cardiac) 100 mg/31ml      . midazolam      . ondansetron (ZOFRAN) IV  4 mg Intravenous Once  . pantoprazole (PROTONIX) IV  40 mg Intravenous Q24H  . piperacillin-tazobactam (ZOSYN)  IV  3.375 g Intravenous Q8H  . potassium chloride  10 mEq Intravenous Q1 Hr x 4  . rocuronium      . sodium chloride  1,000 mL Intravenous Once  . sodium chloride  500 mL Intravenous Once  . succinylcholine      . vancomycin  1,000 mg Intravenous Q24H  . DISCONTD: antiseptic oral rinse  15 mL Mouth Rinse q12n4p  . DISCONTD: heparin subcutaneous  5,000 Units Subcutaneous Q8H  . DISCONTD: insulin regular  0-10 Units Intravenous TID WC  . DISCONTD:  sodium chloride  1,000 mL Intravenous Once   Infusions:    . sodium chloride 999 mL/hr at 04/04/12 2149  . sodium chloride 150 mL/hr at 04/05/12 0310  . dextrose 5 % and 0.45% NaCl    . fentaNYL infusion INTRAVENOUS 50 mcg/hr (04/05/12 0800)  . insulin (NOVOLIN-R) infusion 11.1 mL/hr at 04/05/12 0830  . midazolam (VERSED) infusion Stopped (04/05/12 0800)  . phenylephrine (NEO-SYNEPHRINE) Adult infusion 100 mcg/min (04/05/12 0830)  . DISCONTD: sodium chloride    . DISCONTD: dextrose 5 % and 0.45% NaCl    . DISCONTD: insulin (NOVOLIN-R) infusion 10.8 Units/hr (04/04/12 2043)  . DISCONTD: phenylephrine (NEO-SYNEPHRINE) Adult infusion 80 mcg/min (04/05/12 0430)    Assessment:  83 YOM admitted with DKA  Troponins elevated, starting IV heparin for possible ACS  Hg 8.1, no bleeding reported, Pltc WNL  Received a dose of heparin 5000 units sq ~1am, but should be adequately cleared by now.   Goal of Therapy:  Heparin level 0.3-0.7 units/ml Monitor platelets by anticoagulation protocol: Yes   Plan:   Heparin 2500 units (~40 units/kg) IV bolus, followed by  Heparin  800 units/hr (~12 units/kg/hr)  Check heparin level in 8hrs due to age, elevated Scr  Daily heparin level & CBC   Loralee Pacas, PharmD, BCPS Pager: [phone] 04/05/2012,9:10 AM   Addendum:  8hr - heparin level therapeutic at 0.61.    Plan:  Continue heparin at 800 units/hr. Will recheck heparin with AM labs.   Geoffry Paradise, PharmD, BCPS Pager: [phone] 7:26 PM

## 2012-04-05 NOTE — Progress Notes (Signed)
*  PRELIMINARY RESULTS* Echocardiogram 2D Echocardiogram has been performed.  [name] 04/05/2012, 11:13 AM

## 2012-04-05 NOTE — Progress Notes (Signed)
Name: [name] MRN: [id number] DOB: 1929/02/02    LOS: 1  Referring Provider:  EDP Reason for Referral:  DKA  PULMONARY / CRITICAL CARE MEDICINE   HPI:  78 yo diabetic sent to [facility] ED with nausea, vomiting and altered mental status.  Apparently was withholding insulin as he did not feel well for several weeks.  Found to be hyperglycemic / acidotic.  Brief patient description:  78 yo diabetic sent to [facility] ED with nausea, vomiting and altered mental status.  Apparently was withholding insulin as he did not feel well for several weeks.  Found to be hyperglycemic / acidotic.  Events Since Admission: 8/1  Admitted with DKA  Current Status:  Vital Signs: Temp:  [93.2 F (34 C)-98.2 F (36.8 C)] 98.2 F (36.8 C) (08/02 0800) Pulse Rate:  [70-100] 74  (08/02 0800) Resp:  [16-30] 28  (08/02 0800) BP: (73-107)/(29-69) 107/51 mmHg (08/02 0800) SpO2:  [85 %-100 %] 98 % (08/02 0800) FiO2 (%):  [30 %-100 %] 40 % (08/02 0800) Weight:  [65.772 kg (145 lb)-67 kg (147 lb 11.3 oz)] 67 kg (147 lb 11.3 oz) (08/01 2300)  Physical Examination: General:  Chronically ill appearing Neuro:  Somnolent, arouses to stimulation, confused, nonfocal HEENT:  PERRL, dry membranes Neck:  Supple, no JVD Cardiovascular:  RRR, tachycardic Lungs:  CTAB Abdomen:  Soft, non tender, bowel sounds present  Musculoskeletal:  Moves all extremities Skin:  R foot ulcer, sacral decubitus ulcer  Active Problems:  Hypertension  Diabetes mellitus  Hypothyroidism  Anemia  DKA (diabetic ketoacidoses)  Acute encephalopathy  Dehydration  Acute renal failure  Diabetes with ulcer of foot  Sacral decubitus ulcer  ASSESSMENT AND PLAN  PULMONARY  Lab 04/05/12 0530 04/04/12 2231  PHART 7.350 7.122*  PCO2ART 20.6* 15.9*  PO2ART 250.0* 76.0*  HCO3 11.2* 5.0*  O2SAT 99.8 91.8   Ventilator Settings: Vent Mode:  [-] PRVC FiO2 (%):  [30 %-100 %] 40 % Set Rate:  [30 bmp] 30 bmp Vt Set:  [620 mL] 620 mL PEEP:  [5  cmH20] 5 cmH20 Plateau Pressure:  [23 cmH20-32 cmH20] 24 cmH20 CXR:  8/1 >>> No overt infiltrates ETT:  8/2  A:  Acute respiratory failure.  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  Pos trop.  Prob cardiac ischemia.  Pressor dependent.  P:  Volume resuscitate VP drip Cardiology consultation ASA Hep drip 2d  Echo  RENAL  Lab 04/05/12 0500 04/05/12 0215 04/04/12 2342 04/04/12 1905  NA 136 134* 135 131*  K 3.4* 3.5 -- --  CL 103 100 96 86*  CO2 12* 10* 7* <7*  BUN 27* 27* 28* 28*  CREATININE 1.31 1.32 1.41* 1.41*  CALCIUM 7.5* 7.6* 7.8* 9.2  MG -- -- -- --  PHOS -- -- -- --   Intake/Output      08/01 0701 - 08/02 0700 08/02 0701 - 08/03 0700   I.V. (mL/kg) 2835.2 (42.3) 50 (0.7)   IV Piggyback 250    Total Intake(mL/kg) 3085.2 (46) 50 (0.7)   Urine (mL/kg/hr) 435 (0.3)  Total Output 435    Net +2650.2 +50         Foley:  8/1 >>>  A:  Severe dehydration.   Acute renal failure.  Mild hyponatremia.  Metabolic acidosis. P:   IVF per DKA protocol ABG  GASTROINTESTINAL  Lab 04/04/12 1925  AST 11  ALT 7  ALKPHOS 82  BILITOT 0.1*  PROT 7.1  ALBUMIN 2.7*    A:  No active issues. P:   NPO secondary to altered mental status  HEMATOLOGIC  Lab 04/05/12 0500 04/04/12 2342 04/04/12 1905  HGB 8.1* 8.0* 9.4*  HCT 25.6* 26.4* 32.2*  PLT 290 313 368  INR -- -- --  APTT -- -- --   A:  Anemia.  No overt hemorrhage. P:  Trend CBC  INFECTIOUS  Lab 04/05/12 0500 04/04/12 2342 04/04/12 1905  WBC 4.7 17.5* 22.8*  PROCALCITON -- -- 0.33   Cultures: 8/1  Blood >>>  Antibiotics: Vancomycin 8/1 (sepsis , skin  source)>>> Zosyn 8/1 (sepsis, skin source)>>>  A:  Possible infected diabetic ulcer / sacral decub ulcer.  Procalcitonin reassuring. Sore throat? P:   Antibiotics / cultures as above Reevaluate the need for antibiotics in AM Trend CBC Rapid strep screen Wound care consult , ankle will need debridement  ENDOCRINE  Lab 04/05/12 0517 04/05/12 0358 04/05/12 0254 04/05/12 0158 04/05/12 0102  GLUCAP 556* >600* >600* >600* >600*   A:  DM.  DKA secondary to noncompliance (history of noncompliance).  Hypothyroidism. P:   DKA protocol Synthroid when able to take PO  NEUROLOGIC  Head CT:  8/1 >>> nad  A:  Acute encephalopathy secondary to DKA. P:   Monitor   BEST PRACTICE / DISPOSITION Level of Care:  ICU Primary Service:  PCCM Consultants:  None Code Status:  Full Diet:  NPO DVT Px:  Heparin  drip GI Px:  PPI Skin Integrity:  As above - wound care consulted Social / Family: no family present   The patient is critically ill with multiple organ systems failure and requires high complexity decision making for assessment and support, frequent evaluation and titration of therapies, application of advanced monitoring technologies and extensive interpretation of multiple databases. Critical Care Time devoted to patient care services described in this note is 40 minutes.  Shan Levans, M.D. Beeper  [phone]  Cell  [phone]  If no response or cell goes to voicemail, call beeper [phone]  04/05/2012, 8:49 AM

## 2012-04-05 NOTE — Progress Notes (Signed)
Attempted to call patients daughter [name] twice to update and left a message. Unable to get the granddaughter on the phone also. The patient was given information regarding intubation and did not want his wife called.

## 2012-04-05 NOTE — Progress Notes (Signed)
Inpatient Diabetes Program Recommendations  AACE/ADA: New Consensus Statement on Inpatient Glycemic Control (2013)  Target Ranges:  Prepandial:   less than 140 mg/dL      Peak postprandial:   less than 180 mg/dL (1-2 hours)      Critically ill patients:  140 - 180 mg/dL   Reason for Visit: DKA  78 yo diabetic sent to [facility] ED with nausea, vomiting and altered mental status. Apparently was withholding insulin as he did not feel well for several weeks. Found to be hyperglycemic / acidotic.   Results for NIAM, NEPOMUCENO (MRN [id number]) as of 04/05/2012 17:54  Ref. Range 04/05/2012 05:00 04/05/2012 05:04 04/05/2012 09:45 04/05/2012 14:00 04/05/2012 15:29  Sodium Latest Range: 135-145 mEq/L 136  138  139  Potassium Latest Range: 3.5-5.1 mEq/L 3.4 (L)  3.2 (L)  3.4 (L)  Chloride Latest Range: 96-112 mEq/L 103  108  109  CO2 Latest Range: 19-32 mEq/L 12 (L)  16 (L)  17 (L)  BUN Latest Range: 6-23 mg/dL 27 (H)  25 (H)  24 (H)  Creat Latest Range: 0.50-1.35 mg/dL 4.09  8.11  9.14  Calcium Latest Range: 8.4-10.5 mg/dL 7.5 (L)  7.7 (L)  7.8 (L)  GFR calc non Af Amer Latest Range: >90 mL/min 49 (L)  55 (L)  60 (L)  GFR calc Af Amer Latest Range: >90 mL/min 57 (L)  64 (L)  70 (L)  Glucose Latest Range: 70-99 mg/dL 782 (HH)  956 (H)  213 (H)   Results for THEON, SOBOTKA (MRN [id number]) as of 04/05/2012 17:54  Ref. Range 04/05/2012 10:34 04/05/2012 11:31 04/05/2012 12:29 04/05/2012 13:35 04/05/2012 14:34 04/05/2012 15:30 04/05/2012 16:46 04/05/2012 17:41  Glucose-Capillary Latest Range: 70-99 mg/dL 629 (H) 528 (H) 413 (H) 306 (H) 253 (H) 204 (H) 222 (H) 166 (H)   Continue with GlucoStabilizer until acidosis cleared and CBGs within target x 4 hours.  Give Lantus 1- 2 hours prior to discontinuation of drip.  CBGs continue to improve on GS.  Will follow.

## 2012-04-05 NOTE — Consult Note (Addendum)
WOC consult Note Reason for Consult:multiples wounds, the most significant are located on the right lateral malleolus and the left lower buttock Wound type:Pressure and Venous insufficiency Pressure Ulcer POA: Yes Measurement:right lateral malleolus venous insufficiency ulceration:  3x3.5x.4cm full thickness ulceration with 50% necrotic base/50% red, granulating tissue.  Left lower buttocks pressure ulcer: 3cm x 1.5cm x .2cm with 10% necroitic slough and 90% red, granulating base. Original Stage not know, but ulcer is currently has a depth and presentation consistent with a Stage III ulcer. Multiple small wounds (6) on buttocks, full thickness, and for which the etiology is not known.The largest of which measures 2cm x 2cm and the presentation is not consistent with pressure.  All have wound bases that are obscured by soft, white eschar. Wound bed:see above Drainage (amount, consistency, odor) small amount yellow drainage on old dressings Periwound:intact Dressing procedure/placement/frequency: I will implement santyl ointment to the right lateral malleolus topped with a saline dressing and changed daily.  The other dressings will debride via autolysis beneath a soft silicone foam dressing.  I will provide bilateral Prevalon boots to redistribute pressure at the heels. I will not follow.  Please re-consult if needed. Thanks, Ladona Mow, MSN, RN, [facility], CWOCN [phone])

## 2012-04-05 NOTE — Procedures (Signed)
Name:  [name] MRN:  [id number] DOB:  1929/05/17  PROCEDURE NOTE  Procedure:  Central venous catheter placement.  Indications:  Need for intravenous access and hemodynamic monitoring.  Consent:  Consent was implied due to the emergency nature of the procedure.  Anesthesia:  A total of 10 mL of 1% Lidocaine was used for local infiltration anesthesia.  Procedure summary:  Appropriate equipment was assembled.  The patient was identified as [name] and safety timeout was performed. The patient was placed in Trendelenburg position.  Sterile technique was used. [name] left anterior chest wall was prepped using chlorhexidine / alcohol scrub and the field was draped in usual sterile fashion with full body drape. After the adequate anesthesia was achieved, the left subclavian vein was cannulated with the introducer needle without difficulty. A guide wire was advanced through the introducer needle, which was then withdrawn. A small skin incision was made at the point of wire entry, the dilator was inserted over the guide wire and appropriate dilation was obtained. The dilator was removed and triple-lumen catheter was advanced over the guide wire, which was then removed.  All ports were aspirated and flushed with normal saline without difficulty. The catheter was secured into place. Antibiotic patch was placed and sterile dressing was applied. Post-procedure chest x-ray was ordered.  Complications:  No immediate complications were noted.  Hemodynamic parameters and oxygenation remained stable throughout the procedure.  Estimated blood loss:  Less then 5 mL.  Orlean Bradford, M.D. Pulmonary and Critical Care Medicine [facility] Cell: [phone]  04/05/2012, 5:07 AM

## 2012-04-05 NOTE — Plan of Care (Signed)
Problem: Consults Goal: Skin Care Protocol Initiated - if indicated If consults are not indicated, leave blank or document N/A  Outcome: Progressing WOC RN to see pt, consult ordered.  Pts grandaughter states that pt was seen [facility] and was to f/u today.

## 2012-04-05 NOTE — Procedures (Signed)
Name: [name] MRN: [id number] DOB: Jul 23, 1929  DOS:   PROCEDURE NOTE  Procedure:  Endotracheal intubation.  Indication:  Acute respiratory failure  Consent:  Consent was implied due to the emergency nature of the procedure.  Anesthesia:  A total of 10 mg of Etomidate was given intravenously.  Procedure summary:  Appropriate equipment was assembled. The patient was identified as [name] and safety timeout was performed. The patient was placed supine, with head in sniffing position. After adequate level of anesthesia was achieved, a GS#4 blade was inserted into the oropharynx and the vocal cords were visualized. A 8.0 endotracheal tube was inserted without difficulty and visualized going through the vocal cords. The stylette was removed and cuff inflated. Colorimetric change was noted on the CO2 meter. Breath sounds were heard over both lung fields equally. Post procedure chest xray was ordered.  Complications:  No immediate complications were noted.  Hemodynamic parameters and oxygenation remained stable throughout the procedure.    Orlean Bradford, M.D. Pulmonary and Critical Care Medicine [facility] Cell: [phone] Pager: [phone]  04/05/2012, 5:05 AM

## 2012-04-05 NOTE — Consult Note (Addendum)
CARDIOLOGY CONSULT NOTE   Patient ID: [name] MRN: [id number] DOB/AGE: 76-18-1930 76 y.o.  Admit date: 04/04/2012  Primary Physician   [name], MD Primary Cardiologist   ST saw in 2009 Reason for Consultation   Possible NSTEMI  [name] is a 78 y.o. male with no history of CAD.  He had CHF/anasarca in 2009 and was seen by Dr Donnie Aho but the final diagnosis was anasarca caused by thyroid disease.   He was admitted yesterday with N&V, AMS. He was hyperglycemic and acidotic (this has happened before). He was dehydrated and there was concern for septic shock from an infected sacral ulcer. He developed acute respiratory failure and was intubated early this am. His cardiac enzymes were checked and were elevated so cardiology was asked to evaluate him.  Currently, [name] is intubated and sedated. Information was obtained from staff and old records. Per staff, pt dtr is primary caregiver. He had refused his insulin and also refused to move around and do other things to assist in his care. There is no reported history of chest pain.   Past Medical History  Diagnosis Date  . Diabetes mellitus   . Cancer   . Thyroid disease   . Anemia   . Hyperlipidemia   . Neuropathy, diabetic   . Diabetic retinopathy    Past Surgical History  Procedure Date  . Cataract extraction, bilateral   . Colonoscopy w/ polypectomy    No Known Allergies  I have reviewed the patient's current medications    . antiseptic oral rinse  15 mL Mouth Rinse QID  . aspirin  300 mg Rectal Daily  . chlorhexidine  15 mL Mouth Rinse BID  . etomidate      . fentaNYL      . heparin  2,500 Units Intravenous Once  . lidocaine (cardiac) 100 mg/44ml      . midazolam      . ondansetron (ZOFRAN) IV  4 mg Intravenous Once  . pantoprazole (PROTONIX) IV  40 mg Intravenous Q24H  . piperacillin-tazobactam (ZOSYN)  IV  3.375 g Intravenous Q8H  . potassium chloride  10 mEq Intravenous Q1 Hr x 4  . rocuronium       . sodium chloride  1,000 mL Intravenous Once  . sodium chloride  500 mL Intravenous Once  . succinylcholine      . vancomycin  1,000 mg Intravenous Q24H   . sodium chloride 999 mL/hr at 04/04/12 2149  . sodium chloride 150 mL/hr at 04/05/12 0310  . dextrose 5 % and 0.45% NaCl    . fentaNYL infusion INTRAVENOUS 50 mcg/hr (04/05/12 1125)  . heparin 800 Units/hr (04/05/12 1200)  . insulin (NOVOLIN-R) infusion 15.8 mL/hr at 04/05/12 1125  . midazolam (VERSED) infusion Stopped (04/05/12 0800)  . phenylephrine (NEO-SYNEPHRINE) Adult infusion 100 mcg/min (04/05/12 1125)   dextrose 5 % and 0.45% NaCl, dextrose, DISCONTD: dextrose  Medication Sig  ferrous sulfate 325 (65 FE) MG tablet Take 325 mg by mouth 2 (two) times daily.   folic acid (FOLVITE) 1 MG tablet Take 1 mg by mouth daily.  insulin aspart protamine-insulin aspart (NOVOLOG 70/30) (70-30) 100 UNIT/ML injection Inject 15-18 Units into the skin 2 (two) times daily. 18 units in am, 15 units in pm  levothyroxine (SYNTHROID, LEVOTHROID) 25 MCG tablet Take 25 mcg by mouth daily.  levothyroxine (SYNTHROID, LEVOTHROID) 50 MCG tablet Take 50 mcg by mouth daily. Take with 25 mcg tablet . Total dose is 75 mcg.  ranitidine (ZANTAC)  150 MG tablet Take 150 mg by mouth 2 (two) times daily.  simvastatin (ZOCOR) 20 MG tablet Take 30 mg by mouth every evening. Take one and a half tablet by mouth at bedtime  Tamsulosin HCl (FLOMAX) 0.4 MG CAPS Take 0.4 mg by mouth daily after supper.  vitamin B-12 (CYANOCOBALAMIN) 1000 MCG tablet Take 1,000 mcg by mouth daily.  vitamin C (ASCORBIC ACID) 500 MG tablet Take 500 mg by mouth 2 (two) times daily.       ROS:  Unobtainable secondary to pt condition.  Physical Exam: Blood pressure 103/66, pulse 73, temperature 98.1 F (36.7 C), temperature source Core (Comment), resp. rate 24, height 6' (1.829 m), weight 147 lb 11.3 oz (67 kg), SpO2 99.00%.  General: Well developed, well nourished, male in no acute  distress Head: Pupils equal, slightly constricted, no eye movements secondary to sedation, No xanthomas.   Normocephalic and atraumatic, oropharynx without edema or exudate. Dentition poor Lungs: bilateral rales, no crackles, wheeze or rhonchi Heart: HRRR S1 S2, no rub/gallop, no significant  murmur. pulses are 2+ bilateral upper extrem.  Decreased pulses in both LE. Neck: No carotid bruits. No lymphadenopathy.  JVD minimally elevated. Abdomen: Bowel sounds not heard (ventilator noise noted), abdomen soft and non-tender, slightly distended, without masses or hernias noted. Msk:   No  joint deformities or effusions.  Extremities: No clubbing or cyanosis.  No edema.  Neuro: Prior to intubation - lethargic but oriented X 3 with no focal deficits noted. Psych:  sedated Skin: No rashes noted. Decubitus ulcers bandaged and not disturbed.  Labs:   Lab Results  Component Value Date   WBC 4.7 04/05/2012   HGB 8.1* 04/05/2012   HCT 25.6* 04/05/2012   MCV 100.4* 04/05/2012   PLT 290 04/05/2012    Basename 04/05/12 0945  INR 1.15    Lab 04/05/12 0945 04/04/12 1925  NA 138 --  K 3.2* --  CL 108 --  CO2 16* --  BUN 25* --  CREATININE 1.20 --  CALCIUM 7.7* --  PROT -- 7.1  BILITOT -- 0.1*  ALKPHOS -- 82  ALT -- 7  AST -- 11  GLUCOSE 410* --    Basename 04/05/12 0504 04/04/12 2342 04/04/12 1925  CKTOTAL 241* 118 --  CKMB 14.0* 6.8* --  TROPONINI 6.44* 1.76* 0.43*    Echo: 07/17/2008 SUMMARY - Overall left ventricular systolic function was normal. Left ventricular ejection fraction was estimated to be 65 %. There were no left ventricular regional wall motion abnormalities. Left ventricular wall thickness was moderately increased. - Aortic valve thickness was mildly increased. - The left atrium was mild to moderately dilated. - The right atrium was mild to moderately dilated.   ECG: 05-Apr-2012 08:04:45  Normal sinus rhythm with sinus arrhythmia Right bundle branch block Left  anterior fascicular block ** Bifascicular block ** - seen on ECG 12/04/2011 Abnormal ECG Vent. rate 76 BPM PR interval 146 ms QRS duration 140 ms QT/QTc 436/490 ms P-R-T axes 75 -55 60   Radiology:   Ct Head Wo Contrast 04/04/2012  *RADIOLOGY REPORT*  Clinical Data: Hyperglycemia and altered mental status  CT HEAD WITHOUT CONTRAST  Technique:  Contiguous axial images were obtained from the base of the skull through the vertex without contrast.  Comparison: 10/19/2011  Findings: The bony calvarium is intact.  No gross soft tissue abnormality is noted.  The ventricles and cortical sulci are mildly prominent consistent with mild cortical atrophy.  This is stable from prior exam.  Findings of prior chronic white matter ischemic change are seen.  No acute hemorrhage, acute infarction or space- occupying mass lesion is identified.  IMPRESSION: Chronic changes without acute abnormality.  Original Report Authenticated By: Phillips Odor, M.D.   Dg Chest Port 1 View 04/05/2012  *RADIOLOGY REPORT*  Clinical Data: Post procedure  PORTABLE CHEST - 1 VIEW  Comparison:  04/05/2012  Findings: Endotracheal tube tip 3.5 cm proximal to the carina. Left subclavian catheter tip projects over the distal SVC.  NG tube descends below the level of the image into the abdomen.  Multifocal airspace opacities is similar.  No pneumothorax.  Small effusions not excluded. Stable cardiomediastinal contours.  No acute osseous change.  IMPRESSION: Endotracheal tube tip 3.5 cm proximal to the carina.  Left subclavian catheter tip projects over the distal SVC.  No pneumothorax.  Bilateral airspace opacities similar to prior.  Original Report Authenticated By: Waneta Martins, M.D.   Dg Chest Portable 1 View 04/05/2012  *RADIOLOGY REPORT*  Clinical Data: Hypoxia  PORTABLE CHEST - 1 VIEW  Comparison: 04/04/2012  Findings: Increased airspace opacities, right upper lobe and left lower lobe predominant.  Cardiomegaly.  Central vascular  congestion.  Small left pleural effusion not excluded.  No pneumothorax.  No acute osseous finding.  IMPRESSION: Increasing airspace opacities as above; multifocal pneumonia versus pulmonary edema.  Original Report Authenticated By: Waneta Martins, M.D.    ASSESSMENT AND PLAN:   The patient was seen today by Dr Patty Sermons, the patient evaluated and the data reviewed.  NSTEMI - possibly type 2 secondary to acute illness including sepsis and resp failure. He is on ASA PR. No BB secondary to hypotension. No statin secondary to acute illness. Echo done, results pending, further eval deferred till pt condition improves. We will continue to follow.  Otherwise, per CCM. Active Problems:  Hypertension  Diabetes mellitus  Hypothyroidism  Anemia  DKA (diabetic ketoacidoses)  Acute encephalopathy  Dehydration  Acute renal failure  Diabetes with ulcer of foot  Sacral decubitus ulcer  Septic shock   Signed: Theodore Demark 04/05/2012, 12:26 PM Co-Sign MD Patient is intubated. History obtained from granddaughter.  Patient has had chronic wound ulcers followed at the [facility] hospital. His activity at home has been very sedentary, but he has no history of chest pain or angina.  He had myoview last August which showed EF 51% and inferior wall scar but no ischemia. On this admission his enzymes are significantly elevated.  This is consistent with Type II NSTEMI secondary to demand ischemia from his sepsis and hypotension. EKG shows old bifascicular block but no acute ST-T changes. Recommend continued supportive treatment as outlined by CCM. No BB yet because of hypotension requiring pressors. Will check lipid panel. Anticipate doing another lexiscan myoview  To evaluate cardiac status further prior to hospital discharge. Agree with assessment and plan as noted above.

## 2012-04-06 ENCOUNTER — Inpatient Hospital Stay (HOSPITAL_COMMUNITY): Payer: Medicare Other

## 2012-04-06 DIAGNOSIS — A419 Sepsis, unspecified organism: Secondary | ICD-10-CM

## 2012-04-06 LAB — GLUCOSE, CAPILLARY
Glucose-Capillary: 123 mg/dL — ABNORMAL HIGH (ref 70–99)
Glucose-Capillary: 135 mg/dL — ABNORMAL HIGH (ref 70–99)
Glucose-Capillary: 173 mg/dL — ABNORMAL HIGH (ref 70–99)
Glucose-Capillary: 186 mg/dL — ABNORMAL HIGH (ref 70–99)
Glucose-Capillary: 217 mg/dL — ABNORMAL HIGH (ref 70–99)
Glucose-Capillary: 221 mg/dL — ABNORMAL HIGH (ref 70–99)
Glucose-Capillary: 196 mg/dL — ABNORMAL HIGH (ref 70–99)
Glucose-Capillary: 201 mg/dL — ABNORMAL HIGH (ref 70–99)
Glucose-Capillary: 184 mg/dL — ABNORMAL HIGH (ref 70–99)
Glucose-Capillary: 177 mg/dL — ABNORMAL HIGH (ref 70–99)
Glucose-Capillary: 156 mg/dL — ABNORMAL HIGH (ref 70–99)
Glucose-Capillary: 168 mg/dL — ABNORMAL HIGH (ref 70–99)
Glucose-Capillary: 153 mg/dL — ABNORMAL HIGH (ref 70–99)
Glucose-Capillary: 105 mg/dL — ABNORMAL HIGH (ref 70–99)
Glucose-Capillary: 100 mg/dL — ABNORMAL HIGH (ref 70–99)
Glucose-Capillary: 85 mg/dL (ref 70–99)
Glucose-Capillary: 95 mg/dL (ref 70–99)

## 2012-04-06 LAB — LIPID PANEL
LDL Cholesterol: 37 mg/dL (ref 0–99)
Triglycerides: 37 mg/dL (ref <150)
VLDL: 7 mg/dL (ref 0–40)

## 2012-04-06 LAB — CBC
HCT: 28 % — ABNORMAL LOW (ref 39.0–52.0)
Hemoglobin: 9.7 g/dL — ABNORMAL LOW (ref 13.0–17.0)
Hemoglobin: 8.9 g/dL — ABNORMAL LOW (ref 13.0–17.0)
MCH: 31.8 pg (ref 26.0–34.0)
MCH: 32.2 pg (ref 26.0–34.0)
MCHC: 34.6 g/dL (ref 30.0–36.0)
MCV: 91.8 fL (ref 78.0–100.0)
Platelets: 266 10*3/uL (ref 150–400)
RBC: 2.76 MIL/uL — ABNORMAL LOW (ref 4.22–5.81)
RDW: 15.4 % (ref 11.5–15.5)
WBC: 16.5 10*3/uL — ABNORMAL HIGH (ref 4.0–10.5)

## 2012-04-06 LAB — CARDIAC PANEL(CRET KIN+CKTOT+MB+TROPI)
CK, MB: 11.8 ng/mL (ref 0.3–4.0)
Relative Index: 4.7 — ABNORMAL HIGH (ref 0.0–2.5)
Total CK: 252 U/L — ABNORMAL HIGH (ref 7–232)
Troponin I: 4.09 ng/mL (ref <0.30)

## 2012-04-06 LAB — BASIC METABOLIC PANEL
BUN: 18 mg/dL (ref 6–23)
BUN: 16 mg/dL (ref 6–23)
CO2: 13 mEq/L — ABNORMAL LOW (ref 19–32)
CO2: 17 mEq/L — ABNORMAL LOW (ref 19–32)
Calcium: 7.2 mg/dL — ABNORMAL LOW (ref 8.4–10.5)
Calcium: 7.2 mg/dL — ABNORMAL LOW (ref 8.4–10.5)
Calcium: 7 mg/dL — ABNORMAL LOW (ref 8.4–10.5)
Chloride: 109 mEq/L (ref 96–112)
Chloride: 107 mEq/L (ref 96–112)
Creatinine, Ser: 0.89 mg/dL (ref 0.50–1.35)
Creatinine, Ser: 0.79 mg/dL (ref 0.50–1.35)
Creatinine, Ser: 0.76 mg/dL (ref 0.50–1.35)
GFR calc Af Amer: 89 mL/min — ABNORMAL LOW (ref >90)
GFR calc Af Amer: >90 mL/min (ref >90)
GFR calc Af Amer: >90 mL/min (ref >90)
GFR calc Af Amer: >90 mL/min (ref >90)
GFR calc non Af Amer: 79 mL/min — ABNORMAL LOW (ref >90)
GFR calc non Af Amer: 82 mL/min — ABNORMAL LOW (ref >90)
GFR calc non Af Amer: 83 mL/min — ABNORMAL LOW (ref >90)
Glucose, Bld: 239 mg/dL — ABNORMAL HIGH (ref 70–99)
Glucose, Bld: 216 mg/dL — ABNORMAL HIGH (ref 70–99)
Potassium: 5.2 mEq/L — ABNORMAL HIGH (ref 3.5–5.1)
Potassium: 3.5 mEq/L (ref 3.5–5.1)
Sodium: 134 mEq/L — ABNORMAL LOW (ref 135–145)
Sodium: 132 mEq/L — ABNORMAL LOW (ref 135–145)

## 2012-04-06 LAB — KETONES, QUALITATIVE: Acetone, Bld: NEGATIVE

## 2012-04-06 MED ORDER — INSULIN GLARGINE 100 UNIT/ML ~~LOC~~ SOLN
5.0000 [IU] | SUBCUTANEOUS | Status: DC
  Administered 2012-04-06: 5 [IU] via SUBCUTANEOUS

## 2012-04-06 MED ORDER — POTASSIUM CHLORIDE 10 MEQ/50ML IV SOLN
10.0000 meq | INTRAVENOUS | Status: AC
  Administered 2012-04-06 (×4): 10 meq via INTRAVENOUS

## 2012-04-06 MED ORDER — POTASSIUM CHLORIDE 10 MEQ/50ML IV SOLN
INTRAVENOUS | Status: AC
  Filled 2012-04-06: qty 200

## 2012-04-06 MED ORDER — SODIUM CHLORIDE 0.9 % IV SOLN
INTRAVENOUS | Status: DC
Start: 2012-04-06 — End: 2012-04-20
  Administered 2012-04-06: 1000 mL via INTRAVENOUS
  Administered 2012-04-11: 22:00:00 via INTRAVENOUS
  Administered 2012-04-13: 20 mL/h via INTRAVENOUS
  Administered 2012-04-16: 12:00:00 via INTRAVENOUS

## 2012-04-06 MED ORDER — INSULIN ASPART 100 UNIT/ML ~~LOC~~ SOLN
0.0000 [IU] | SUBCUTANEOUS | Status: DC
  Administered 2012-04-07 (×4): 3 [IU] via SUBCUTANEOUS
  Administered 2012-04-07 – 2012-04-08 (×2): 1 [IU] via SUBCUTANEOUS

## 2012-04-06 MED ORDER — ASPIRIN 325 MG PO TABS: 325.0000 mg | ORAL_TABLET | Freq: Every day | ORAL | Status: DC

## 2012-04-06 MED ORDER — DEXTROSE-NACL 5-0.45 % IV SOLN
INTRAVENOUS | Status: DC
  Administered 2012-04-06 (×3): 1000 mL via INTRAVENOUS
  Administered 2012-04-06: 14:00:00 via INTRAVENOUS
  Administered 2012-04-07: 1000 mL via INTRAVENOUS

## 2012-04-06 MED ORDER — POTASSIUM CHLORIDE 10 MEQ/50ML IV SOLN
10.0000 meq | INTRAVENOUS | Status: AC
  Administered 2012-04-06 (×2): 10 meq via INTRAVENOUS
  Filled 2012-04-06 (×2): qty 50

## 2012-04-06 MED ORDER — VANCOMYCIN HCL IN DEXTROSE 1-5 GM/200ML-% IV SOLN
1000.0000 mg | Freq: Two times a day (BID) | INTRAVENOUS | Status: DC
  Administered 2012-04-06 – 2012-04-11 (×10): 1000 mg via INTRAVENOUS
  Filled 2012-04-06 (×11): qty 200

## 2012-04-06 MED ORDER — ASPIRIN 325 MG PO TABS
325.0000 mg | ORAL_TABLET | Freq: Every day | ORAL | Status: DC
  Filled 2012-04-06 (×2): qty 1

## 2012-04-06 NOTE — Progress Notes (Signed)
Name: [name] MRN: [id number] DOB: 1929/07/01    LOS: 2  Referring Provider:  EDP Reason for Referral:  DKA  PULMONARY / CRITICAL CARE MEDICINE   HPI:  78 yo diabetic sent to [facility] ED with nausea, vomiting and altered mental status.  Apparently was withholding insulin as he did not feel well for several weeks.  Found to be hyperglycemic / acidotic.  Brief patient description:  78 yo diabetic sent to [facility] ED with nausea, vomiting and altered mental status.  Apparently was withholding insulin as he did not feel well for several weeks.  Found to be hyperglycemic / acidotic.  Events Since Admission: 8/1  Admitted with DKA  Current Status: DKA persisting. Still on insulin drip  Vital Signs: Temp:  [97.2 F (36.2 C)-98.4 F (36.9 C)] 97.2 F (36.2 C) (08/03 1000) Pulse Rate:  [67-79] 73  (08/03 1000) Resp:  [25-34] 30  (08/03 1000) BP: (92-125)/(46-64) 107/57 mmHg (08/03 1000) SpO2:  [93 %-100 %] 98 % (08/03 1000) FiO2 (%):  [30 %-40 %] 40 % (08/03 1000) Weight:  [75.1 kg (165 lb 9.1 oz)] 75.1 kg (165 lb 9.1 oz) (08/03 0000)  Physical Examination: General:  Chronically ill appearing Neuro:  Awake , ETT in place HEENT:  PERRL, dry membranes Neck:  Supple, no JVD Cardiovascular:  RRR, tachycardic Lungs:  CTAB Abdomen:  Soft, non tender, bowel sounds present  Musculoskeletal:  Moves all extremities Skin:  R foot ulcer, sacral decubitus ulcer  Active Problems:  Hypertension  Diabetes mellitus  Hypothyroidism  Anemia  DKA (diabetic ketoacidoses)  Acute encephalopathy  Dehydration  Acute renal failure  Diabetes with ulcer of foot  Sacral decubitus ulcer  Non-ST elevation myocardial infarction (NSTEMI), initial episode of care  Septic shock  ASSESSMENT AND PLAN  PULMONARY  Lab 04/05/12 0530 04/04/12 2231  PHART 7.350 7.122*  PCO2ART 20.6* 15.9*  PO2ART 250.0* 76.0*  HCO3 11.2* 5.0*  O2SAT 99.8 91.8   Ventilator Settings: Vent Mode:  [-] CPAP;PSV FiO2 (%):   [30 %-40 %] 40 % Set Rate:  [30 bmp] 30 bmp Vt Set:  [620 mL] 620 mL PEEP:  [5 cmH20] 5 cmH20 Pressure Support:  [8 cmH20] 8 cmH20 Plateau Pressure:  [22 cmH20-27 cmH20] 24 cmH20 CXR: no film ETT:  8/2  A:  Acute respiratory failure.  Severe acidosis, Septic shock skin source P:   Full vent supp RASS -1 to -2 SBT/WUA daily Cannot extubate today 8/3 d/t vasopressors/ acidosis persisting   CARDIOVASCULAR  Lab 04/05/12 1400 04/05/12 0504 04/05/12 0500 04/04/12 2342 04/04/12 2339 04/04/12 1925  TROPONINI 13.50* 6.44* -- 1.76* -- 0.43*  LATICACIDVEN -- -- 2.9* -- 3.0* 4.8*  PROBNP -- -- -- -- -- --   ECG:  None Lines: CVL  L Gross 8/2>>  A:  Combined cardiogenic/septic shock.  NONSTEMI.   P:  Cont hep drip Cards says treat medically ASA  RENAL  Lab 04/06/12 0835 04/06/12 0610 04/06/12 0226 04/05/12 2030 04/05/12 1529  NA 134* 135 134* 139 139  K 3.5 3.9 -- -- --  CL 108 107 109 111 109  CO2 16* 17* 13* 17* 17*  BUN 17 18 20 21  24*  CREATININE 0.86 0.89 0.92 0.96 1.11  CALCIUM 7.2* 7.2* 7.3* 7.6* 7.8*  MG -- -- -- -- --  PHOS -- -- -- -- --   Intake/Output      08/02 0701 - 08/03 0700 08/03 0701 - 08/04 0700   P.O. 9    I.V. (  mL/kg) 1468.3 (19.6) 364.4 (4.9)   IV Piggyback 1110 29   Total Intake(mL/kg) 2587.3 (34.5) 393.4 (5.2)   Urine (mL/kg/hr) 1030 (0.6) 100 (0.3)   Emesis/NG output  250   Total Output 1030 350   Net +1557.3 +43.4         Foley:  8/1 >>>  A:  Acute renal failure.  Mild hyponatremia.  Metabolic acidosis. P:   IVF per DKA protocol   GASTROINTESTINAL  Lab 04/04/12 1925  AST 11  ALT 7  ALKPHOS 82  BILITOT 0.1*  PROT 7.1  ALBUMIN 2.7*    A:  No active issues. P:   NPO secondary to altered mental status  HEMATOLOGIC  Lab 04/06/12 0610 04/05/12 0945 04/05/12 0500 04/04/12 2342 04/04/12 1905  HGB 9.7* -- 8.1* 8.0* 9.4*  HCT 28.0* -- 25.6* 26.4* 32.2*  PLT 271 -- 290 313 368  INR -- 1.15 -- -- --  APTT -- 34 -- -- --   A:   Anemia.  No overt hemorrhage. P:  Trend CBC  INFECTIOUS  Lab 04/06/12 0610 04/05/12 0500 04/04/12 2342 04/04/12 1905  WBC 15.2* 4.7 17.5* 22.8*  PROCALCITON -- -- -- 0.33   Cultures: 8/1  Blood >>>  Antibiotics: Vancomycin 8/1 (sepsis , skin source)>>> Zosyn 8/1 (sepsis, skin source)>>>  A:  Possible infected diabetic ulcer / sacral decub ulcer.  Procalcitonin reassuring. Sore throat? P:   Antibiotics / cultures as above Trend CBC Wound care consult advised dressing changes only  ENDOCRINE  Lab 04/06/12 0927 04/06/12 0823 04/06/12 0731 04/06/12 0625 04/06/12 0525  GLUCAP 184* 196* 201* 221* 217*   A:  DM.  DKA secondary to noncompliance (history of noncompliance).  Hypothyroidism. P:   DKA protocol to continue Synthroid when able to take PO  NEUROLOGIC  Head CT:  8/1 >>> nad  A:  Acute encephalopathy secondary to DKA. P:   Monitor   BEST PRACTICE / DISPOSITION Level of Care:  ICU Primary Service:  PCCM Consultants:  None Code Status:  Full Diet:  NPO DVT Px:  Heparin  drip GI Px:  PPI Skin Integrity:  As above - per  wound care consult Social / Family: no family present   The patient is critically ill with multiple organ systems failure and requires high complexity decision making for assessment and support, frequent evaluation and titration of therapies, application of advanced monitoring technologies and extensive interpretation of multiple databases. Critical Care Time devoted to patient care services described in this note is 40 minutes.  Shan Levans, M.D. Beeper  [phone]  Cell  [phone]  If no response or cell goes to voicemail, call beeper [phone]  04/06/2012, 11:22 AM

## 2012-04-06 NOTE — Progress Notes (Signed)
@   Subjective:  Intubated; opens eyes   Objective:  Filed Vitals:   04/06/12 0300 04/06/12 0342 04/06/12 0400 04/06/12 0500  BP: 102/46 102/46 104/48 125/59  Pulse: 68 71 69 79  Temp: 97.9 F (36.6 C) 97.7 F (36.5 C) 97.7 F (36.5 C) 97.9 F (36.6 C)  TempSrc:      Resp: 34 30 29 25   Height:      Weight:      SpO2: 97% 98% 96% 93%    Intake/Output from previous day:  Intake/Output Summary (Last 24 hours) at 04/06/12 0801 Last data filed at 04/06/12 0751  Gross per 24 hour  Intake 2572.27 ml  Output   1155 ml  Net 1417.27 ml    Physical Exam: Physical exam: Well-developed chronically ill appearing Skin is warm and dry.  HEENT scleral edema Neck is supple. Chest is clear to auscultation with normal expansion.  Cardiovascular exam is regular rate and rhythm.  Abdominal exam nontender or distended. No masses palpated. Extremities show 1+ edema. Ulcers dressed neuro not assessed as patient intubated.    Lab Results: Basic Metabolic Panel:  Basename 04/06/12 0610 04/06/12 0226  NA 135 134*  K 3.9 5.2*  CL 107 109  CO2 17* 13*  GLUCOSE 239* 169*  BUN 18 20  CREATININE 0.89 0.92  CALCIUM 7.2* 7.3*  MG -- --  PHOS -- --   CBC:  Basename 04/06/12 0610 04/05/12 0500 04/04/12 1905  WBC 15.2* 4.7 --  NEUTROABS -- -- 18.0*  HGB 9.7* 8.1* --  HCT 28.0* 25.6* --  MCV 91.8 100.4* --  PLT 271 290 --   Cardiac Enzymes:  Basename 04/05/12 1400 04/05/12 0504 04/04/12 2342  CKTOTAL 348* 241* 118  CKMB 24.3* 14.0* 6.8*  CKMBINDEX -- -- --  TROPONINI 13.50* 6.44* 1.76*     Assessment/Plan:  1) NSTEMI - Continue ASA and heparin; add statin when extubated; add beta blocker when BP improves (presently remains on pressors). Not clear to me patient is a good candidate for further cardiac testing. Will reassess over the course of his hospitalization as he improves. 2) VDRF - vent wean per CCM 3) Acute renal insufficiency - improving and CVP now 18; would decrease  IVFs. 4) DKA 5) sepsis - continue antibiotics  Olga Millers 04/06/2012, 8:01 AM

## 2012-04-06 NOTE — Progress Notes (Addendum)
ANTICOAGULATION CONSULT NOTE   Pharmacy Consult for Heparin; Vancomycin/Zosyn Indication: NSTEMI; Sepsis   No Known Allergies  Patient Measurements: Height: 6' (182.9 cm) Weight: 165 lb 9.1 oz (75.1 kg) IBW/kg (Calculated) : 77.6  Heparin Dosing Weight: 67kg  Vital Signs: Temp: 97.9 F (36.6 C) (08/03 0500) Temp src: Core (Comment) (08/02 2000) BP: 125/59 mmHg (08/03 0500) Pulse Rate: 79  (08/03 0500)  Labs:  Basename 04/06/12 0610 04/06/12 0226 04/05/12 2030 04/05/12 1854 04/05/12 1529 04/05/12 1400 04/05/12 0945 04/05/12 0504 04/05/12 0500 04/04/12 2342  HGB 9.7* -- -- -- -- -- -- -- 8.1* --  HCT 28.0* -- -- -- -- -- -- -- 25.6* 26.4*  PLT 271 -- -- -- -- -- -- -- 290 313  APTT -- -- -- -- -- -- 34 -- -- --  LABPROT -- -- -- -- -- -- 14.9 -- -- --  INR -- -- -- -- -- -- 1.15 -- -- --  HEPARINUNFRC 0.30 -- -- 0.61 -- -- -- -- -- --  CREATININE -- 0.92 0.96 -- 1.11 -- -- -- -- --  CKTOTAL -- -- -- -- -- 348* -- 241* -- 118  CKMB -- -- -- -- -- 24.3* -- 14.0* -- 6.8*  TROPONINI -- -- -- -- -- 13.50* -- 6.44* -- 1.76*    Estimated Creatinine Clearance: 66.9 ml/min (by C-G formula based on Cr of 0.92).  65 ml/mim/1.38m2 (normalized)  Cultures: 8/1 blood x2: 8/1 urine: NGF 8/1 mrsa pcr: neg 8/2 rapid strep: neg   Medications:  Scheduled:     . antiseptic oral rinse  15 mL Mouth Rinse QID  . aspirin  300 mg Rectal Daily  . chlorhexidine  15 mL Mouth Rinse BID  . collagenase   Topical Daily  . heparin  2,500 Units Intravenous Once  . lidocaine (cardiac) 100 mg/27ml      . pantoprazole (PROTONIX) IV  40 mg Intravenous Q24H  . piperacillin-tazobactam (ZOSYN)  IV  3.375 g Intravenous Q8H  . potassium chloride  10 mEq Intravenous Q1 Hr x 4  . potassium chloride  10 mEq Intravenous Q1 Hr x 2  . potassium chloride  10 mEq Intravenous Q1 Hr x 4  . potassium chloride      . rocuronium      . sodium chloride  500 mL Intravenous Once  . succinylcholine      .  vancomycin  1,000 mg Intravenous Q24H  . DISCONTD: heparin subcutaneous  5,000 Units Subcutaneous Q8H   Infusions:     . sodium chloride 20 mL/hr at 04/06/12 0600  . dextrose 5 % and 0.45% NaCl 1,000 mL (04/06/12 0648)  . fentaNYL infusion INTRAVENOUS 75 mcg/hr (04/06/12 0600)  . heparin 800 Units/hr (04/06/12 0600)  . insulin (NOVOLIN-R) infusion 4.8 Units/hr (04/06/12 0630)  . midazolam (VERSED) infusion Stopped (04/05/12 0800)  . phenylephrine (NEO-SYNEPHRINE) Adult infusion 53.333 mcg/min (04/06/12 0600)  . DISCONTD: sodium chloride 1,000 mL (04/05/12 2124)  . DISCONTD: dextrose 5 % and 0.45% NaCl 1,000 mL (04/05/12 2340)    Assessment:  83 YOM admitted with DKA  Troponins elevated, IV heparin for Type II NSTEMI  Hg improved (9.7), no bleeding reported, Pltc WNL  Heparin level at low end of therapeutic range on 800 units/hr   Goal of Therapy:  Heparin level 0.3-0.7 units/ml Monitor platelets by anticoagulation protocol: Yes   Heparin Plan:   Increase heparin slightly to 900 units/hr  Recheck heparin level in 8hrs due to age, elevated Scr  Daily heparin level & CBC  Antibiotic Plan:  Increase Vancomycin to 1g IV q12h  Check trough at new steady state  Continue Zosyn 3.375gm IV q8h (4hr extended infusions) Follow up renal function & cultures   Loralee Pacas, PharmD, BCPS Pager: [phone] 04/06/2012,6:59 AM

## 2012-04-06 NOTE — Progress Notes (Signed)
Hypokalemia   Replaced  

## 2012-04-07 ENCOUNTER — Inpatient Hospital Stay (HOSPITAL_COMMUNITY): Payer: Medicare Other

## 2012-04-07 LAB — CBC
HCT: 26.5 % — ABNORMAL LOW (ref 39.0–52.0)
Hemoglobin: 9.2 g/dL — ABNORMAL LOW (ref 13.0–17.0)
MCH: 32.3 pg (ref 26.0–34.0)
MCHC: 34.7 g/dL (ref 30.0–36.0)
RBC: 2.85 MIL/uL — ABNORMAL LOW (ref 4.22–5.81)

## 2012-04-07 LAB — GLUCOSE, CAPILLARY
Glucose-Capillary: 141 mg/dL — ABNORMAL HIGH (ref 70–99)
Glucose-Capillary: 154 mg/dL — ABNORMAL HIGH (ref 70–99)
Glucose-Capillary: 170 mg/dL — ABNORMAL HIGH (ref 70–99)
Glucose-Capillary: 225 mg/dL — ABNORMAL HIGH (ref 70–99)
Glucose-Capillary: 227 mg/dL — ABNORMAL HIGH (ref 70–99)
Glucose-Capillary: 271 mg/dL — ABNORMAL HIGH (ref 70–99)
Glucose-Capillary: 278 mg/dL — ABNORMAL HIGH (ref 70–99)
Glucose-Capillary: 73 mg/dL (ref 70–99)

## 2012-04-07 LAB — BASIC METABOLIC PANEL
BUN: 15 mg/dL (ref 6–23)
BUN: 14 mg/dL (ref 6–23)
CO2: 18 mEq/L — ABNORMAL LOW (ref 19–32)
Calcium: 6.9 mg/dL — ABNORMAL LOW (ref 8.4–10.5)
Chloride: 103 mEq/L (ref 96–112)
Creatinine, Ser: 0.72 mg/dL (ref 0.50–1.35)
GFR calc non Af Amer: 85 mL/min — ABNORMAL LOW (ref >90)
Glucose, Bld: 261 mg/dL — ABNORMAL HIGH (ref 70–99)
Glucose, Bld: 278 mg/dL — ABNORMAL HIGH (ref 70–99)
Potassium: 3.4 mEq/L — ABNORMAL LOW (ref 3.5–5.1)
Sodium: 130 mEq/L — ABNORMAL LOW (ref 135–145)
Sodium: 130 mEq/L — ABNORMAL LOW (ref 135–145)

## 2012-04-07 LAB — HEPATIC FUNCTION PANEL
ALT: 7 U/L (ref 0–53)
Alkaline Phosphatase: 79 U/L (ref 39–117)
Indirect Bilirubin: 0.5 mg/dL (ref 0.3–0.9)
Total Bilirubin: 0.7 mg/dL (ref 0.3–1.2)

## 2012-04-07 LAB — HEMOGLOBIN A1C: Hgb A1c MFr Bld: 10.5 % — ABNORMAL HIGH (ref <5.7)

## 2012-04-07 MED ORDER — FUROSEMIDE 10 MG/ML IJ SOLN
40.0000 mg | Freq: Once | INTRAMUSCULAR | Status: AC
  Administered 2012-04-07: 40 mg via INTRAVENOUS
  Filled 2012-04-07: qty 4

## 2012-04-07 MED ORDER — INSULIN GLARGINE 100 UNIT/ML ~~LOC~~ SOLN: 10.0000 [IU] | Freq: Two times a day (BID) | SUBCUTANEOUS | Status: DC

## 2012-04-07 MED ORDER — SODIUM CHLORIDE 0.9 % IV SOLN
INTRAVENOUS | Status: DC
  Administered 2012-04-07: 1000 mL via INTRAVENOUS

## 2012-04-07 MED ORDER — RACEPINEPHRINE HCL 2.25 % IN NEBU
0.5000 mL | INHALATION_SOLUTION | Freq: Once | RESPIRATORY_TRACT | Status: AC
  Administered 2012-04-07: 0.5 mL via RESPIRATORY_TRACT
  Filled 2012-04-07: qty 0.5

## 2012-04-07 MED ORDER — INSULIN GLARGINE 100 UNIT/ML ~~LOC~~ SOLN
5.0000 [IU] | Freq: Two times a day (BID) | SUBCUTANEOUS | Status: DC
  Administered 2012-04-07 – 2012-04-08 (×3): 5 [IU] via SUBCUTANEOUS

## 2012-04-07 MED ORDER — METOPROLOL TARTRATE 12.5 MG HALF TABLET
12.5000 mg | ORAL_TABLET | Freq: Two times a day (BID) | ORAL | Status: DC
  Administered 2012-04-07 – 2012-04-10 (×5): 12.5 mg via NASOGASTRIC
  Filled 2012-04-07 (×8): qty 1

## 2012-04-07 NOTE — Progress Notes (Signed)
OG tube flushed numerous times. Placement checked by ausculation. Continues to drain a moderate amount of bright red drainage and scant clots.  Notified Dr. Delford Field and orders received to discontinue heparin drip and aspirin. Will continue to monitor.

## 2012-04-07 NOTE — Progress Notes (Signed)
eLink Physician-Brief Progress Note Patient Name: [name] DOB: September 27, 1928 MRN: [id number]  Date of Service  04/07/2012   HPI/Events of Note   Ongoing hypoxemia and increased WOB  eICU Interventions  Place on bilevel and give more lasix.  Will likely need vent again   Intervention Category Major Interventions: Respiratory failure - evaluation and management  Shan Levans 04/07/2012, 5:32 PM

## 2012-04-07 NOTE — Progress Notes (Signed)
@   Subjective:  Intubated; opens eyes to questions   Objective:  Filed Vitals:   04/07/12 0400 04/07/12 0500 04/07/12 0528 04/07/12 0600  BP: 129/55 123/68 123/68 129/64  Pulse: 77 79 85 79  Temp: 97.7 F (36.5 C) 97.7 F (36.5 C)  97.2 F (36.2 C)  TempSrc:      Resp: 30 29 30    Height:      Weight:    172 lb 6.4 oz (78.2 kg)  SpO2: 96% 97%  96%    Intake/Output from previous day:  Intake/Output Summary (Last 24 hours) at 04/07/12 0759 Last data filed at 04/07/12 0600  Gross per 24 hour  Intake   4203 ml  Output   1540 ml  Net   2663 ml    Physical Exam: Physical exam: Well-developed chronically ill appearing Skin is warm and dry.  HEENT scleral edema Neck is supple. Chest is clear to auscultation with normal expansion anteriorly Cardiovascular exam is regular rate and rhythm.  Abdominal exam nontender or distended. No masses palpated. Extremities show trace edema. Ulcers dressed neuro not assessed as patient intubated.    Lab Results: Basic Metabolic Panel:  Basename 04/07/12 0500 04/07/12 0045  NA 130* 130*  K 3.4* 3.8  CL 103 104  CO2 18* 16*  GLUCOSE 278* 261*  BUN 14 15  CREATININE 0.71 0.72  CALCIUM 7.0* 6.9*  MG -- --  PHOS -- --   CBC:  Basename 04/07/12 0500 04/06/12 2030 04/04/12 1905  WBC 17.4* 16.5* --  NEUTROABS -- -- 18.0*  HGB 9.2* 8.9* --  HCT 26.5* 25.6* --  MCV 93.0 92.8 --  PLT 264 266 --   Cardiac Enzymes:  Basename 04/06/12 1215 04/05/12 1400 04/05/12 0504  CKTOTAL 252* 348* 241*  CKMB 11.8* 24.3* 14.0*  CKMBINDEX -- -- --  TROPONINI 4.09* 13.50* 6.44*     Assessment/Plan:  1) NSTEMI - ASA and heparin dced yesterday because of blood from OG tube; resume ASA when bleeding improves; add statin when extubated; add beta blocker-metoprolol 12.5 mg po BID (now off pressors). Not clear to me patient is a good candidate for further cardiac testing. Will reassess over the course of his hospitalization as he improves. 2) VDRF  - vent wean per CCM 3) Acute renal insufficiency - improved 4) DKA 5) sepsis - continue antibiotics  Olga Millers 04/07/2012, 7:59 AM

## 2012-04-07 NOTE — Progress Notes (Signed)
Moderate respiratory distress. Bilateral crackles and upper airway crackles. Coughing often, moderate cough.  Scant secretions produced from cough.  Patient has used flutter valve with assistance.  Notified Dr. Delford Field, [place]. New orders received.

## 2012-04-07 NOTE — Progress Notes (Addendum)
ANTIBIOTIC CONSULT NOTE - FOLLOW UP  Pharmacy Consult for Vancomycin/Zosyn Indication: Sepsis, skin source  No Known Allergies  Patient Measurements: Height: 6' (182.9 cm) Weight: 172 lb 6.4 oz (78.2 kg) IBW/kg (Calculated) : 77.6   Vital Signs: Temp: 96.3 F (35.7 C) (08/04 1100) Temp src: Core (Comment) (08/04 1100) BP: 126/74 mmHg (08/04 1200) Pulse Rate: 77  (08/04 1200) Intake/Output from previous day: 08/03 0701 - 08/04 0700 In: 4347.4 [I.V.:3680.9; IV Piggyback:666.5] Out: 1840 [Urine:950; Emesis/NG output:890] Intake/Output from this shift: Total I/O In: 528 [I.V.:270; IV Piggyback:258] Out: 1575 [Urine:1575]  Labs:  [facility] 04/07/12 0500 04/07/12 0045 04/06/12 2030 04/06/12 0610  WBC 17.4* -- 16.5* 15.2*  HGB 9.2* -- 8.9* 9.7*  PLT 264 -- 266 271  LABCREA -- -- -- --  CREATININE 0.71 0.72 0.76 --   Estimated Creatinine Clearance: 79.5 ml/min (by C-G formula based on Cr of 0.71).  82 ml/mim/1.57m2 (normalized)   Microbiology: Recent Results (from the past 720 hour(s))  URINE CULTURE     Status: Normal   Collection Time   04/04/12  7:57 PM      Component Value Range Status Comment   Specimen Description URINE, CLEAN CATCH   Final    Special Requests NONE   Final    Culture  Setup Time 04/05/2012 01:54   Final    Colony Count NO GROWTH   Final    Culture NO GROWTH   Final    Report Status 04/05/2012 FINAL   Final   CULTURE, BLOOD (ROUTINE X 2)     Status: Normal (Preliminary result)   Collection Time   04/04/12  8:20 PM      Component Value Range Status Comment   Specimen Description BLOOD LEFT HAND   Final    Special Requests BOTTLES DRAWN AEROBIC ONLY 3CC   Final    Culture  Setup Time 04/05/2012 02:15   Final    Culture     Final    Value:        BLOOD CULTURE RECEIVED NO GROWTH TO DATE CULTURE WILL BE HELD FOR 5 DAYS BEFORE ISSUING A FINAL NEGATIVE REPORT   Report Status PENDING   Incomplete   CULTURE, BLOOD (ROUTINE X 2)     Status: Normal  (Preliminary result)   Collection Time   04/04/12  8:48 PM      Component Value Range Status Comment   Specimen Description BLOOD RIGHT ARM   Final    Special Requests BOTTLES DRAWN AEROBIC ONLY 3CC   Final    Culture  Setup Time 04/05/2012 02:15   Final    Culture     Final    Value:        BLOOD CULTURE RECEIVED NO GROWTH TO DATE CULTURE WILL BE HELD FOR 5 DAYS BEFORE ISSUING A FINAL NEGATIVE REPORT   Report Status PENDING   Incomplete   MRSA PCR SCREENING     Status: Normal   Collection Time   04/04/12 11:28 PM      Component Value Range Status Comment   MRSA by PCR NEGATIVE  NEGATIVE Final   RAPID STREP SCREEN     Status: Normal   Collection Time   04/05/12 12:08 AM      Component Value Range Status Comment   Streptococcus, Group A Screen (Direct) NEGATIVE  NEGATIVE Final     Anti-infectives     Start     Dose/Rate Route Frequency Ordered Stop   04/06/12 1200  vancomycin (VANCOCIN) IVPB 1000 mg/200 mL premix        1,000 mg 200 mL/hr over 60 Minutes Intravenous Every 12 hours 04/06/12 0717     04/04/12 2300   vancomycin (VANCOCIN) IVPB 1000 mg/200 mL premix  Status:  Discontinued        1,000 mg 200 mL/hr over 60 Minutes Intravenous Every 24 hours 04/04/12 2231 04/06/12 0717   04/04/12 2300  piperacillin-tazobactam (ZOSYN) IVPB 3.375 g       3.375 g 12.5 mL/hr over 240 Minutes Intravenous Every 8 hours 04/04/12 2231            Assessment:  83 YOM with diabetes, admitted with DKA  On day #4 vancomycin/zosyn for sepsis, likely source is multiple decubiti  Scr has improved from admission, therefore vancomycin dose was increased yesterday to 1gm q12h  Afebrile, hypothermic on admit, WBC elevated and rising  All cultures negative to date.  Goal of Therapy:  Vancomycin trough level 15-20 mcg/ml  Plan:   Check vancomycin trough tonight prior to 4th dose of new regimen  Continue zosyn 3.375gm IV q8h (4hr extended infusions) Follow up renal function & cultures,  de-escalation of abx when appropriate.  Loralee Pacas, PharmD, BCPS Pager: [phone] 04/07/2012,2:23 PM   ADDENDUM:  Vancomycin trough level = 20 mcg/ml on regimen of Vancomycin 1000mg  IV q12h which is within desired range of 15-20 mcg/ml.  Continue current regimen of Vancomycin and monitor renal function & culture data.  Terrilee Files, PharmD 04/08/12 @ 01:10

## 2012-04-07 NOTE — Progress Notes (Signed)
Pt instructed on Flutter Valve - good effort

## 2012-04-07 NOTE — Progress Notes (Signed)
Pt extubated to 100% NRB to maintain Sats> 90%.  Rhonchi bilaterally, strong productive cough, no stridor.  Rt will continue to monitor

## 2012-04-07 NOTE — Progress Notes (Signed)
Name: [name] MRN: [id number] DOB: 05/15/1929    LOS: 3  Referring Provider:  EDP Reason for Referral:  DKA  PULMONARY / CRITICAL CARE MEDICINE   HPI:  78 yo diabetic sent to [facility] ED with nausea, vomiting and altered mental status.  Apparently was withholding insulin as he did not feel well for several weeks.  Found to be hyperglycemic / acidotic.  Brief patient description:  78 yo diabetic sent to [facility] ED with nausea, vomiting and altered mental status.  Apparently was withholding insulin as he did not feel well for several weeks.  Found to be hyperglycemic / acidotic.  Events Since Admission: 8/1  Admitted with DKA  Current Status: DKA persisting. Still on insulin drip  Vital Signs: Temp:  [95.9 F (35.5 C)-97.9 F (36.6 C)] 96.6 F (35.9 C) (08/04 0900) Pulse Rate:  [73-89] 77  (08/04 0900) Resp:  [10-30] 20  (08/04 0900) BP: (107-142)/(47-68) 131/62 mmHg (08/04 0900) SpO2:  [94 %-100 %] 96 % (08/04 0600) FiO2 (%):  [30 %-40 %] 30 % (08/04 0900) Weight:  [78.2 kg (172 lb 6.4 oz)] 78.2 kg (172 lb 6.4 oz) (08/04 0600)  Physical Examination: General:  Chronically ill appearing Neuro:  Awake , ETT in place HEENT:  PERRL, dry membranes Neck:  Supple, no JVD Cardiovascular:  RRR, tachycardic Lungs:  CTAB Abdomen:  Soft, non tender, bowel sounds present  Musculoskeletal:  Moves all extremities Skin:  R foot ulcer, sacral decubitus ulcer  Active Problems:  Hypertension  Diabetes mellitus  Hypothyroidism  Anemia  DKA (diabetic ketoacidoses)  Acute encephalopathy  Dehydration  Acute renal failure  Diabetes with ulcer of foot  Sacral decubitus ulcer  Non-ST elevation myocardial infarction (NSTEMI), initial episode of care  Septic shock  ASSESSMENT AND PLAN  PULMONARY  Lab 04/05/12 0530 04/04/12 2231  PHART 7.350 7.122*  PCO2ART 20.6* 15.9*  PO2ART 250.0* 76.0*  HCO3 11.2* 5.0*  O2SAT 99.8 91.8   Ventilator Settings: Vent Mode:  [-] CPAP;PSV FiO2 (%):   [30 %-40 %] 30 % Set Rate:  [30 bmp] 30 bmp Vt Set:  [620 mL] 620 mL PEEP:  [5 cmH20] 5 cmH20 Pressure Support:  [8 cmH20] 8 cmH20 Plateau Pressure:  [26 cmH20-27 cmH20] 26 cmH20 CXR: no film ETT:  8/2  A:  Acute respiratory failure.  Severe acidosis, Septic shock skin source P:   SBT today to extubate. D/C sedation protocol.  CARDIOVASCULAR  Lab 04/06/12 1215 04/05/12 1400 04/05/12 0504 04/05/12 0500 04/04/12 2342 04/04/12 2339 04/04/12 1925  TROPONINI 4.09* 13.50* 6.44* -- 1.76* -- 0.43*  LATICACIDVEN -- -- -- 2.9* -- 3.0* 4.8*  PROBNP -- -- -- -- -- -- --   ECG:  None Lines: CVL  L [place] 8/2>>  A:  Combined cardiogenic/septic shock.  NONSTEMI.   P:  Cont hep drip. Cards recommends to treat medically. ASA.  RENAL  Lab 04/07/12 0500 04/07/12 0045 04/06/12 2030 04/06/12 1215 04/06/12 0835  NA 130* 130* 132* 135 134*  K 3.4* 3.8 -- -- --  CL 103 104 105 108 108  CO2 18* 16* 18* 18* 16*  BUN 14 15 15 16 17   CREATININE 0.71 0.72 0.76 0.79 0.86  CALCIUM 7.0* 6.9* 7.0* 6.9* 7.2*  MG -- -- -- -- --  PHOS -- -- -- -- --   Intake/Output      08/03 0701 - 08/04 0700 08/04 0701 - 08/05 0700   P.O.     I.V. (mL/kg) 3680.9 (47.1) 240 (3.1)  IV Piggyback 666.5    Total Intake(mL/kg) 4347.4 (55.6) 240 (3.1)   Urine (mL/kg/hr) 950 (0.5)    Emesis/NG output 890    Total Output 1840    Net +2507.4 +240         Foley:  8/1 >>>  A:  Acute renal failure.  Mild hyponatremia.  Metabolic acidosis. P:   IVF per DKA protocol.  GASTROINTESTINAL  Lab 04/07/12 0500 04/04/12 1925  AST 21 11  ALT 7 7  ALKPHOS 79 82  BILITOT 0.7 0.1*  PROT 5.5* 7.1  ALBUMIN 1.6* 2.7*    A:  No active issues. P:   Swallow evaluation and diet.  HEMATOLOGIC  Lab 04/07/12 0500 04/06/12 2030 04/06/12 0610 04/05/12 0945 04/05/12 0500 04/04/12 2342  HGB 9.2* 8.9* 9.7* -- 8.1* 8.0*  HCT 26.5* 25.6* 28.0* -- 25.6* 26.4*  PLT 264 266 271 -- 290 313  INR -- -- -- 1.15 -- --  APTT -- -- -- 34 --  --   A:  Anemia.  No overt hemorrhage. P:  Trend CBC.  INFECTIOUS  Lab 04/07/12 0500 04/06/12 2030 04/06/12 0610 04/05/12 0500 04/04/12 2342 04/04/12 1905  WBC 17.4* 16.5* 15.2* 4.7 17.5* --  PROCALCITON -- -- -- -- -- 0.33   Cultures: 8/1  Blood >>>  Antibiotics: Vancomycin 8/1 (sepsis , skin source)>>> Zosyn 8/1 (sepsis, skin source)>>>  A:  Possible infected diabetic ulcer / sacral decub ulcer.  Procalcitonin reassuring. Sore throat? P:   Antibiotics / cultures as above. Trend CBC. Wound care consult advised dressing changes only.  ENDOCRINE  Lab 04/07/12 0755 04/07/12 0342 04/06/12 2348 04/06/12 1943 04/06/12 1811  GLUCAP 271* 278* 227* 95 73   A:  DM.  DKA secondary to noncompliance (history of noncompliance).  Hypothyroidism. P:   DKA protocol to continue Synthroid when able to take PO Lantus. Diabetes coordinator.  NEUROLOGIC  Head CT:  8/1 >>> nad  A:  Acute encephalopathy secondary to DKA. P:   Monitor   BEST PRACTICE / DISPOSITION Level of Care:  ICU Primary Service:  PCCM Consultants:  None Code Status:  Full Diet:  NPO DVT Px:  Heparin  drip GI Px:  PPI Skin Integrity:  As above - per  wound care consult Social / Family: family updated bedside.  The patient is critically ill with multiple organ systems failure and requires high complexity decision making for assessment and support, frequent evaluation and titration of therapies, application of advanced monitoring technologies and extensive interpretation of multiple databases. Critical Care Time devoted to patient care services described in this note is 40 minutes.  Koren Bound, M.D. Beeper  [phone]  Cell  [phone]  If no response or cell goes to voicemail, call beeper [phone]  04/07/2012, 9:39 AM

## 2012-04-07 NOTE — Plan of Care (Signed)
Problem: Phase I Progression Outcomes Goal: VTE prophylaxis Outcome: Progressing Heparin drip 04/06/12.  Discontinued due to red drainage in OG tube.  To be reevaluated today. Goal: GIProphysixis Outcome: Progressing IV protonix Goal: Oral Care per Protocol Outcome: Progressing Per ventilator oral care protocol. Goal: VAP prevention protocol initiated Outcome: Progressing VAP prevention protocol initiated at intubation. Goal: Sedation Protocol initiated if indicated Outcome: Progressing Has not required sedation since 04-06-12.  Responds to voice. Goal: Initiate hyperglycemia protocol as indicated Outcome: Progressing Insulin drip discontinued 04-06-12. Goal: Pain controlled with appropriate interventions Outcome: Progressing Patient denies pain. Goal: Hemodynamically stable Outcome: Progressing Vasopressors discontinued. Goal: Patient tolerating nututrition at goal Outcome: Progressing To reevaluate today.  Possible extubation today. Goal: Patient tolerating weaning plan Outcome: Progressing Tolerated 8/5 weaning 04-06-12.

## 2012-04-07 NOTE — Progress Notes (Signed)
Patient has not received fentanyl since the drip was stopped at 11am 04-06-12.  Has not received versed in over twenty-four hours.  Patient is very lethargic.  Responds to voice and follows commands. Will continue to monitor.

## 2012-04-08 ENCOUNTER — Inpatient Hospital Stay (HOSPITAL_COMMUNITY): Payer: Medicare Other

## 2012-04-08 DIAGNOSIS — J9600 Acute respiratory failure, unspecified whether with hypoxia or hypercapnia: Secondary | ICD-10-CM

## 2012-04-08 LAB — GLUCOSE, CAPILLARY
Glucose-Capillary: 158 mg/dL — ABNORMAL HIGH (ref 70–99)
Glucose-Capillary: 171 mg/dL — ABNORMAL HIGH (ref 70–99)
Glucose-Capillary: 177 mg/dL — ABNORMAL HIGH (ref 70–99)
Glucose-Capillary: 242 mg/dL — ABNORMAL HIGH (ref 70–99)
Glucose-Capillary: 229 mg/dL — ABNORMAL HIGH (ref 70–99)
Glucose-Capillary: 208 mg/dL — ABNORMAL HIGH (ref 70–99)

## 2012-04-08 LAB — BASIC METABOLIC PANEL
BUN: 13 mg/dL (ref 6–23)
Chloride: 101 mEq/L (ref 96–112)
GFR calc non Af Amer: 82 mL/min — ABNORMAL LOW (ref >90)
Glucose, Bld: 163 mg/dL — ABNORMAL HIGH (ref 70–99)
Potassium: 3.2 mEq/L — ABNORMAL LOW (ref 3.5–5.1)
Sodium: 132 mEq/L — ABNORMAL LOW (ref 135–145)

## 2012-04-08 LAB — BLOOD GAS, ARTERIAL
Drawn by: 129801
MECHVT: 470 mL
RATE: 26 resp/min
pCO2 arterial: 31.4 mmHg — ABNORMAL LOW (ref 35.0–45.0)
pH, Arterial: 7.392 (ref 7.350–7.450)
pO2, Arterial: 56 mmHg — ABNORMAL LOW (ref 80.0–100.0)

## 2012-04-08 LAB — CBC
HCT: 29 % — ABNORMAL LOW (ref 39.0–52.0)
Hemoglobin: 9.9 g/dL — ABNORMAL LOW (ref 13.0–17.0)
MCHC: 34.1 g/dL (ref 30.0–36.0)
RBC: 3.11 MIL/uL — ABNORMAL LOW (ref 4.22–5.81)
WBC: 12.1 10*3/uL — ABNORMAL HIGH (ref 4.0–10.5)

## 2012-04-08 LAB — PHOSPHORUS: Phosphorus: 3.1 mg/dL (ref 2.3–4.6)

## 2012-04-08 MED ORDER — MAGNESIUM SULFATE 40 MG/ML IJ SOLN
4.0000 g | Freq: Once | INTRAMUSCULAR | Status: AC
  Administered 2012-04-08: 4 g via INTRAVENOUS
  Filled 2012-04-08 (×2): qty 100

## 2012-04-08 MED ORDER — POTASSIUM CHLORIDE 10 MEQ/50ML IV SOLN
10.0000 meq | INTRAVENOUS | Status: AC
  Administered 2012-04-08 (×4): 10 meq via INTRAVENOUS
  Filled 2012-04-08 (×4): qty 50

## 2012-04-08 MED ORDER — FENTANYL CITRATE 0.05 MG/ML IJ SOLN
25.0000 ug | INTRAMUSCULAR | Status: DC | PRN
  Administered 2012-04-08 – 2012-04-09 (×3): 50 ug via INTRAVENOUS
  Administered 2012-04-10: 25 ug via INTRAVENOUS
  Administered 2012-04-10 (×2): 50 ug via INTRAVENOUS
  Filled 2012-04-08: qty 4
  Filled 2012-04-08 (×5): qty 2

## 2012-04-08 MED ORDER — LEVOTHYROXINE SODIUM 100 MCG IV SOLR
12.5000 ug | Freq: Every day | INTRAVENOUS | Status: DC
  Administered 2012-04-08 – 2012-04-09 (×2): 12.5 ug via INTRAVENOUS
  Filled 2012-04-08 (×2): qty 0.63

## 2012-04-08 MED ORDER — INSULIN ASPART 100 UNIT/ML ~~LOC~~ SOLN
4.0000 [IU] | SUBCUTANEOUS | Status: DC
  Administered 2012-04-09 – 2012-04-17 (×46): 4 [IU] via SUBCUTANEOUS

## 2012-04-08 MED ORDER — OXEPA PO LIQD
1000.0000 mL | ORAL | Status: DC
  Administered 2012-04-08 – 2012-04-26 (×14): 1000 mL
  Filled 2012-04-08 (×23): qty 1000

## 2012-04-08 MED ORDER — PRO-STAT SUGAR FREE PO LIQD
30.0000 mL | Freq: Two times a day (BID) | ORAL | Status: DC
  Administered 2012-04-09 – 2012-04-26 (×32): 30 mL via ORAL
  Filled 2012-04-08 (×40): qty 30

## 2012-04-08 MED ORDER — NOREPINEPHRINE BITARTRATE 1 MG/ML IJ SOLN
2.0000 ug/min | INTRAVENOUS | Status: DC
  Administered 2012-04-08: 10 ug/min via INTRAVENOUS
  Administered 2012-04-10: 20 ug/min via INTRAVENOUS
  Administered 2012-04-11 – 2012-04-12 (×4): 10 ug/min via INTRAVENOUS
  Filled 2012-04-08 (×8): qty 4

## 2012-04-08 MED ORDER — INSULIN GLARGINE 100 UNIT/ML ~~LOC~~ SOLN
10.0000 [IU] | Freq: Two times a day (BID) | SUBCUTANEOUS | Status: DC
  Administered 2012-04-08: 10 [IU] via SUBCUTANEOUS

## 2012-04-08 MED ORDER — BIOTENE DRY MOUTH MT LIQD: 15.0000 mL | Freq: Four times a day (QID) | OROMUCOSAL | Status: DC

## 2012-04-08 MED ORDER — INSULIN ASPART 100 UNIT/ML ~~LOC~~ SOLN
0.0000 [IU] | SUBCUTANEOUS | Status: DC
  Administered 2012-04-08: 2 [IU] via SUBCUTANEOUS
  Administered 2012-04-08: 7 [IU] via SUBCUTANEOUS
  Administered 2012-04-09: 2 [IU] via SUBCUTANEOUS
  Administered 2012-04-09 (×2): 7 [IU] via SUBCUTANEOUS
  Administered 2012-04-09: 2 [IU] via SUBCUTANEOUS
  Administered 2012-04-10: 4 [IU] via SUBCUTANEOUS
  Administered 2012-04-10: 2 [IU] via SUBCUTANEOUS
  Administered 2012-04-10 – 2012-04-11 (×3): 4 [IU] via SUBCUTANEOUS
  Administered 2012-04-11: 2 [IU] via SUBCUTANEOUS
  Administered 2012-04-11: 4 [IU] via SUBCUTANEOUS
  Administered 2012-04-11: 2 [IU] via SUBCUTANEOUS
  Administered 2012-04-11: 4 [IU] via SUBCUTANEOUS
  Administered 2012-04-12: 7 [IU] via SUBCUTANEOUS
  Administered 2012-04-12 – 2012-04-14 (×11): 2 [IU] via SUBCUTANEOUS
  Administered 2012-04-14: 4 [IU] via SUBCUTANEOUS
  Administered 2012-04-14 – 2012-04-15 (×4): 2 [IU] via SUBCUTANEOUS
  Administered 2012-04-15: 4 [IU] via SUBCUTANEOUS
  Administered 2012-04-15 (×2): 2 [IU] via SUBCUTANEOUS
  Administered 2012-04-16 (×2): 7 [IU] via SUBCUTANEOUS
  Administered 2012-04-16: 2 [IU] via SUBCUTANEOUS
  Administered 2012-04-17 – 2012-04-18 (×7): 7 [IU] via SUBCUTANEOUS
  Administered 2012-04-18: 2 [IU] via SUBCUTANEOUS
  Administered 2012-04-18: 7 [IU] via SUBCUTANEOUS
  Administered 2012-04-18: 4 [IU] via SUBCUTANEOUS
  Administered 2012-04-18 (×2): 7 [IU] via SUBCUTANEOUS
  Administered 2012-04-19: 2 [IU] via SUBCUTANEOUS
  Administered 2012-04-19 (×2): 4 [IU] via SUBCUTANEOUS

## 2012-04-08 MED ORDER — CHLORHEXIDINE GLUCONATE 0.12 % MT SOLN: 15.0000 mL | Freq: Two times a day (BID) | OROMUCOSAL | Status: DC

## 2012-04-08 MED ORDER — INSULIN ASPART 100 UNIT/ML ~~LOC~~ SOLN
0.0000 [IU] | SUBCUTANEOUS | Status: DC
  Administered 2012-04-08: 6 [IU] via SUBCUTANEOUS

## 2012-04-08 MED ORDER — MIDAZOLAM HCL 5 MG/ML IJ SOLN
1.0000 mg | INTRAMUSCULAR | Status: DC | PRN
  Administered 2012-04-08 – 2012-04-10 (×2): 2 mg via INTRAVENOUS
  Filled 2012-04-08 (×2): qty 1

## 2012-04-08 NOTE — Progress Notes (Signed)
Nutrition Follow-up  Consult for tube feeding initiation and management   Intervention:  1. Will initiate TF of Oxepa @ 20 ml/hr and increase by 10 ml every 4 hours to a goal rate of 45 ml/hr plus 2 packets of prostat. Total enteral nutrition support to provide 1820 kcal, 98 grams of protein and 848 ml free water. (Meets 100% of estimated calorie and protein needs).  2. RD to follow for nutrition plan of care.   Assessment:   Discussed patient in rounds, pt re-intubated this morning. Noted patient on ARDS protocol until clearance. Patient with multiple wounds.   Diet Order:  NPO  Meds: Scheduled Meds:   . antiseptic oral rinse  15 mL Mouth Rinse QID  . chlorhexidine  15 mL Mouth Rinse BID  . collagenase   Topical Daily  . furosemide  40 mg Intravenous Once  . insulin aspart  0-3 Units Subcutaneous Q4H  . insulin glargine  5 Units Subcutaneous BID  . levothyroxine  12.5 mcg Intravenous Daily  . magnesium sulfate 1 - 4 g bolus IVPB  4 g Intravenous Once  . metoprolol tartrate  12.5 mg Per NG tube BID  . pantoprazole (PROTONIX) IV  40 mg Intravenous Q24H  . piperacillin-tazobactam (ZOSYN)  IV  3.375 g Intravenous Q8H  . potassium chloride  10 mEq Intravenous Q1 Hr x 4  . Racepinephrine HCl  0.5 mL Nebulization Once  . vancomycin  1,000 mg Intravenous Q12H  . DISCONTD: antiseptic oral rinse  15 mL Mouth Rinse QID  . DISCONTD: chlorhexidine  15 mL Mouth Rinse BID  . DISCONTD: insulin aspart  0-3 Units Subcutaneous Q4H   Continuous Infusions:   . sodium chloride 20 mL/hr at 04/06/12 0800  . norepinephrine (LEVOPHED) Adult infusion 2 mcg/min (04/08/12 1200)  . DISCONTD: fentaNYL infusion INTRAVENOUS Stopped (04/06/12 0800)  . DISCONTD: midazolam (VERSED) infusion Stopped (04/05/12 0800)   PRN Meds:.dextrose, fentaNYL, midazolam  Labs:  CMP     Component Value Date/Time   NA 132* 04/08/2012 0500   K 3.2* 04/08/2012 0500   CL 101 04/08/2012 0500   CO2 22 04/08/2012 0500   GLUCOSE 163*  04/08/2012 0500   BUN 13 04/08/2012 0500   CREATININE 0.80 04/08/2012 0500   CALCIUM 7.2* 04/08/2012 0500   PROT 5.5* 04/07/2012 0500   ALBUMIN 1.6* 04/07/2012 0500   AST 21 04/07/2012 0500   ALT 7 04/07/2012 0500   ALKPHOS 79 04/07/2012 0500   BILITOT 0.7 04/07/2012 0500   GFRNONAA 82* 04/08/2012 0500   GFRAA >90 04/08/2012 0500     Intake/Output Summary (Last 24 hours) at 04/08/12 1246 Last data filed at 04/08/12 1200  Gross per 24 hour  Intake 800.93 ml  Output   3555 ml  Net -2754.07 ml    Weight Status:  167 lb. Weight up 20 lb from 8/2, recommend re-weigh patient for accuracy.   Re-estimated needs:  1670-1870 kcal, 91-114 grams protein  Nutrition Dx:  Inadequate oral intake, Ongoing.   Goal:  1. TF initiation with positive tolerance.  2. Meet > 90% of estimated energy needs with positive tolerance.   Monitor:  Extubation, TF tolerance, weights, labs   Adron Bene [phone]

## 2012-04-08 NOTE — Progress Notes (Signed)
Patient starting on Oxepa tube feeding, beginning at 20 cc's/hr and increasing to 45 cc's/hr.  Oxepa contains 0.1053 GM of CHO per cc.  At goal rate of 45 cc's/hr patient would receive approximately 18.92 GM of CHO every 4 hours.  Tube feed coverage at 1:10 CHO ratio would be 2 units novolog insulin every four hours and at 1:15 ratio would be 1 unit novolog insulin every four hours.  (Tube feeding coverage needs to be held any time tube feeding is stopped for any reason.)  Thank you. Quintella Mura S. Elsie Lincoln, RN, CNS, CDE  [phone])

## 2012-04-08 NOTE — Therapy (Signed)
Pt was turned and sats fell into the low 80's. Tried to give him time to recover but he never did. Put him on [place] at 6 l along with his non rebreather. sats would only come up to the high 80's. Called the black box and Dr.Alva instructed to put him on bi pap. Pt now sats in the high 90's. Pt is stable at this time.

## 2012-04-08 NOTE — Progress Notes (Signed)
Name: [name] MRN: [id number] DOB: 1929-06-23    LOS: 4  Referring Provider:  EDP Reason for Referral:  DKA  PULMONARY / CRITICAL CARE MEDICINE   HPI:  78 yo diabetic sent to [facility] ED with nausea, vomiting and altered mental status.  Apparently was withholding insulin as he did not feel well for several weeks.  Found to be hyperglycemic / acidotic.  Brief patient description:  78 yo diabetic sent to [facility] ED with nausea, vomiting and altered mental status.  Apparently was withholding insulin as he did not feel well for several weeks.  Found to be hyperglycemic / acidotic.  Events Since Admission: 8/1  Admitted with DKA  Current Status: Requiring high flow oxygen. Poor cough mechanics and thick secretions making NIPPV difficult.   Vital Signs: Temp:  [95.7 F (35.4 C)-97.7 F (36.5 C)] 97.7 F (36.5 C) (08/05 0800) Pulse Rate:  [70-92] 86  (08/05 0800) Resp:  [19-29] 22  (08/05 0800) BP: (87-139)/(41-82) 108/48 mmHg (08/05 0800) SpO2:  [86 %-100 %] 93 % (08/05 0800) FiO2 (%):  [30 %-100 %] 60 % (08/05 0800) Weight:  [76.1 kg (167 lb 12.3 oz)] 76.1 kg (167 lb 12.3 oz) (08/05 0400) 100% NRB   Physical Examination: General:  Chronically ill appearing Neuro:  Awake  HEENT:  PERRL, dry membranes Neck:  Supple, no JVD Cardiovascular:  RRR, tachycardic Lungs:  + accessory muscle use. Diffuse rhonchi Abdomen:  Soft, non tender, bowel sounds present  Musculoskeletal:  Moves all extremities Skin:  R foot ulcer, sacral decubitus ulcer  Active Problems:  Hypertension  Diabetes mellitus  Hypothyroidism  Anemia  DKA (diabetic ketoacidoses)  Acute encephalopathy  Dehydration  Acute renal failure  Diabetes with ulcer of foot  Sacral decubitus ulcer  Non-ST elevation myocardial infarction (NSTEMI), initial episode of care  Septic shock  ASSESSMENT AND PLAN  PULMONARY  Lab 04/05/12 0530 04/04/12 2231  PHART 7.350 7.122*  PCO2ART 20.6* 15.9*  PO2ART 250.0* 76.0*  HCO3  11.2* 5.0*  O2SAT 99.8 91.8   Ventilator Settings: Vent Mode:  [-] PCV;BIPAP FiO2 (%):  [30 %-100 %] 60 % PEEP:  [8 cmH20] 8 cmH20 CXR: no film ETT:  8/2>>>8/4 ETT 8/5>>>  A:  Acute respiratory failure.  Severe acidosis, Septic shock skin source. Now with progressive bilateral Pulmonary infiltrates. Worrisome for Edema vs Progressive ALI.  P:   Check cvp-->see if room to diurese Will need to intubate given poor ability to clear secretions, will place on ARDS protocol until we asses for clearance B infiltrates with positive pressure Start sedation protocol.    CARDIOVASCULAR  Lab 04/06/12 1215 04/05/12 1400 04/05/12 0504 04/05/12 0500 04/04/12 2342 04/04/12 2339 04/04/12 1925  TROPONINI 4.09* 13.50* 6.44* -- 1.76* -- 0.43*  LATICACIDVEN -- -- -- 2.9* -- 3.0* 4.8*  PROBNP -- -- -- -- -- -- --   ECG:  None Lines: CVL  L [place] 8/2>>  A:  Combined cardiogenic/septic shock.  NSTEMI.   P:  Cont hep drip. Cards recommends to treat medically. ASA.  RENAL  Lab 04/08/12 0500 04/07/12 0500 04/07/12 0045 04/06/12 2030 04/06/12 1215  NA 132* 130* 130* 132* 135  K 3.2* 3.4* -- -- --  CL 101 103 104 105 108  CO2 22 18* 16* 18* 18*  BUN 13 14 15 15 16   CREATININE 0.80 0.71 0.72 0.76 0.79  CALCIUM 7.2* 7.0* 6.9* 7.0* 6.9*  MG 1.2* -- -- -- --  PHOS 3.1 -- -- -- --   Intake/Output  08/04 0701 - 08/05 0700 08/05 0701 - 08/06 0700   I.V. (mL/kg) 520 (6.8)    IV Piggyback 562 50   Total Intake(mL/kg) 1082 (14.2) 50 (0.7)   Urine (mL/kg/hr) 5000 (2.7) 130   Emesis/NG output     Total Output 5000 130   Net -3918 -80         Foley:  8/1 >>>  A:  Mild hyponatremia.  Metabolic acidosis.      Hypokalemia Metabolic acidosis improved. Renal fxn holding.  P:   Keep euvolemic Replace K    GASTROINTESTINAL  Lab 04/07/12 0500 04/04/12 1925  AST 21 11  ALT 7 7  ALKPHOS 79 82  BILITOT 0.7 0.1*  PROT 5.5* 7.1  ALBUMIN 1.6* 2.7*    A:  Respiratory status preventing oral  intake  P:   Start tube feeds.    HEMATOLOGIC  Lab 04/08/12 0500 04/07/12 0500 04/06/12 2030 04/06/12 0610 04/05/12 0945 04/05/12 0500  HGB 9.9* 9.2* 8.9* 9.7* -- 8.1*  HCT 29.0* 26.5* 25.6* 28.0* -- 25.6*  PLT 224 264 266 271 -- 290  INR -- -- -- -- 1.15 --  APTT -- -- -- -- 34 --   A:  Anemia.  No overt hemorrhage. P:  Trend CBC.  INFECTIOUS  Lab 04/08/12 0500 04/07/12 0500 04/06/12 2030 04/06/12 0610 04/05/12 0500 04/04/12 1905  WBC 12.1* 17.4* 16.5* 15.2* 4.7 --  PROCALCITON -- -- -- -- -- 0.33   Cultures: 8/1  Blood >>> 8/1 MRSA screen: negative   Antibiotics: Vancomycin 8/1 (sepsis , skin source)>>> Zosyn 8/1 (sepsis, skin source)>>>  A:  Possible infected diabetic ulcer / sacral decub ulcer. P:   Antibiotics / cultures as above. Trend CBC. Wound care consult advised dressing changes only.  ENDOCRINE  Lab 04/08/12 0743 04/08/12 0330 04/07/12 2331 04/07/12 2013 04/07/12 1638  GLUCAP 171* 158* 154* 141* 170*   A:  DM.  DKA secondary to noncompliance (history of noncompliance).  Hypothyroidism. Still hyperglycemic but out of DKA.  P:   Synthroid IV, convert when able to take PO Lantus. Diabetes coordinator.  NEUROLOGIC  Head CT:  8/1 >>> nad  A:  Acute encephalopathy secondary to DKA and sepsis P:   Monitor/supportive care.   BEST PRACTICE / DISPOSITION Level of Care:  ICU Primary Service:  PCCM Consultants:  None Code Status:  Full Diet:  NPO DVT Px:  Heparin  drip GI Px:  PPI Skin Integrity:  As above - per  wound care consult Social / Family: family updated bedside.  The patient is critically ill with multiple organ systems failure and requires high complexity decision making for assessment and support, frequent evaluation and titration of therapies, application of advanced monitoring technologies and extensive interpretation of multiple databases. Critical Care Time devoted to patient care services described in this note is 40  minutes.  BABCOCK,PETE,  04/08/2012, 8:42 AM   Attending Addendum:  I have seen the patient, discussed the issues, test results and plans with Kreg Shropshire, NP. I agree with the Assessment and Plans as ammended above.   Levy Pupa, MD, PhD 04/08/2012, 11:28 AM [facility] [phone] or if no answer [phone]

## 2012-04-08 NOTE — Progress Notes (Signed)
eLink Physician-Brief Progress Note Patient Name: [name] DOB: October 20, 1928 MRN: [id number]  Date of Service  04/08/2012   HPI/Events of Note     eICU Interventions  Hypokalemia -repleted hypomag   Intervention Category Intermediate Interventions: Electrolyte abnormality - evaluation and management  ALVA,RAKESH V. 04/08/2012, 5:49 AM

## 2012-04-08 NOTE — Progress Notes (Signed)
SLP Cancellation Note  Swallow evaluation cancelled today due to medical issues with patient which prohibited therapy :  Pt currently on full vent support due to respiratory insufficiency.  Will return this week for eval as indicated and ordered. Thanks.     Donavan Burnet, MS [facility] SLP [phone]

## 2012-04-08 NOTE — Clinical Documentation Improvement (Signed)
POA DOCUMENTATION CLARIFICATION QUERY  THIS DOCUMENT IS NOT A PERMANENT PART OF THE MEDICAL RECORD  TO RESPOND TO THE THIS QUERY, FOLLOW THE INSTRUCTIONS BELOW:  1. If needed, update documentation for the patient's encounter via the notes activity.  2. Access this query again and click edit on the In [employer].  3. After updating, or not, click F2 to complete all highlighted (required) fields concerning your review. Select "additional documentation in the medical record" OR "no additional documentation provided".  4. Click Sign note button.  5. The deficiency will fall out of your In Basket *Please let us know if you are not able to complete this workflow by phone or e-mail (listed below).  Please update your documentation within the medical record to reflect your response to this query.                                                                                    04/08/12  Dear Dr. Leslye Peer   In a better effort to capture your patient's severity of illness, reflect appropriate length of stay and utilization of resources, a review of the patient medical record has revealed the following indicators.    Based on your clinical judgment, please clarify and document in a progress note and/or discharge summary the clinical condition associated with the following supporting information:  In responding to this query please exercise your independent judgment.  The fact that a query is asked, does not imply that any particular answer is desired or expected.  Medicare rules require specification as to whether an inpatient diagnosis was present at the time of admission.  Please clarify if the following diagnosis,  Pt admitted with DKA/Encephalopathy  PN on 04/06/12 states pt with NSTEMI and pn 04/08/12 states pt with cardiogenic shock   Please clarify the following and document in pn or d/c summary (see selection below)  1. NSTEMI and cardiogenic shock were (POA) present on  admission 2. NSTEMI and cardiogenic shock were not (POA) present on admission 3.Unable to clinically determine whether the condition was present on admission. 4. Documentation insufficient to determine if condition was present at the time of inpatient admission  You may use possible, probable, or suspect with inpatient documentation. possible, probable, suspected diagnoses MUST be documented at the time of discharge  Reviewed: additional documentation in the medical record      Thank You,  Enis Slipper  RN, BSN, MSN/Inf, CCDS Clinical Documentation Specialist [facility] Dept Pager: [phone] / E-mail: [email or web address]  Health Information Management [place]

## 2012-04-08 NOTE — Progress Notes (Signed)
    SUBJECTIVE: Pt on bipap. No complaints this am.   BP 108/60  Pulse 90  Temp 97 F (36.1 C) (Core (Comment))  Resp 23  Ht 6' (1.829 m)  Wt 167 lb 12.3 oz (76.1 kg)  BMI 22.75 kg/m2  SpO2 95%  Intake/Output Summary (Last 24 hours) at 04/08/12 4098 Last data filed at 04/07/12 1900  Gross per 24 hour  Intake    747 ml  Output   3125 ml  Net  -2378 ml    PHYSICAL EXAM General: Well developed, well nourished. Wearing bipap. Appears comfortable.  Alert.   Neck: No masses noted. No JVD Lungs: Clear bilaterally with no wheezes or rhonci noted.  Heart: RRR with no murmurs noted. Abdomen: Bowel sounds are present. Soft, non-tender.  Extremities: Trace bilateral lower extremity edema.   LABS: Basic Metabolic Panel:  Basename 04/08/12 0500 04/07/12 0500  NA 132* 130*  K 3.2* 3.4*  CL 101 103  CO2 22 18*  GLUCOSE 163* 278*  BUN 13 14  CREATININE 0.80 0.71  CALCIUM 7.2* 7.0*  MG 1.2* --  PHOS 3.1 --   CBC:  Basename 04/08/12 0500 04/07/12 0500  WBC 12.1* 17.4*  NEUTROABS -- --  HGB 9.9* 9.2*  HCT 29.0* 26.5*  MCV 93.2 93.0  PLT 224 264   Cardiac Enzymes:  Basename 04/06/12 1215 04/05/12 1400  CKTOTAL 252* 348*  CKMB 11.8* 24.3*  CKMBINDEX -- --  TROPONINI 4.09* 13.50*   Fasting Lipid Panel:  Basename 04/06/12 0610  CHOL 101  HDL 57  LDLCALC 37  TRIG 37  CHOLHDL 1.8  LDLDIRECT --    Current Meds:    . antiseptic oral rinse  15 mL Mouth Rinse QID  . chlorhexidine  15 mL Mouth Rinse BID  . collagenase   Topical Daily  . furosemide  40 mg Intravenous Once  . furosemide  40 mg Intravenous Once  . insulin aspart  0-3 Units Subcutaneous Q4H  . insulin glargine  5 Units Subcutaneous BID  . magnesium sulfate 1 - 4 g bolus IVPB  4 g Intravenous Once  . metoprolol tartrate  12.5 mg Per NG tube BID  . pantoprazole (PROTONIX) IV  40 mg Intravenous Q24H  . piperacillin-tazobactam (ZOSYN)  IV  3.375 g Intravenous Q8H  . potassium chloride  10 mEq  Intravenous Q1 Hr x 4  . Racepinephrine HCl  0.5 mL Nebulization Once  . vancomycin  1,000 mg Intravenous Q12H  . DISCONTD: insulin glargine  10 Units Subcutaneous BID  . DISCONTD: insulin glargine  5 Units Subcutaneous Q24H     ASSESSMENT AND PLAN:  1) NSTEMI:  Cardiac enzymes elevated in setting of DKA/possible sepsis. ASA and heparin stopped because of bleeding from OG tube.  He is tolerating metoprolol. Given his age and comorbidities, would recommend conservative management in regards to cardiac testing. If he greatly improves, may consider further testing. He likely has obstructive CAD.    2) Respiratory Failure: Per PCCM  3) Acute renal insufficiency - Improved    [name]  8/5/20136:37 AM

## 2012-04-08 NOTE — Procedures (Signed)
Intubation Procedure Note [name] [id number] 12/26/1928  Procedure: Intubation Indications: Respiratory insufficiency  Procedure Details Consent: Risks of procedure as well as the alternatives and risks of each were explained to the (patient/caregiver).  Consent for procedure obtained. and Unable to obtain consent because of emergent medical necessity. Time Out: Verified patient identification, verified procedure, site/side was marked, verified correct patient position, special equipment/implants available, medications/allergies/relevent history reviewed, required imaging and test results available.  Performed  Maximum sterile technique was used including antiseptics, cap, gloves, gown, hand hygiene, mask and sheet.  MAC #3    Evaluation Hemodynamic Status: BP stable throughout; O2 sats: transiently fell during during procedure Patient's Current Condition: stable Complications: No apparent complications Patient did tolerate procedure well. Chest X-ray ordered to verify placement.  CXR: tube position acceptable.   BABCOCK,PETE 04/08/2012   Levy Pupa, MD, PhD 04/08/2012, 11:25 AM [facility] [phone] or if no answer [phone]

## 2012-04-08 NOTE — Plan of Care (Signed)
Problem: Phase I Progression Outcomes Goal: VTE prophylaxis Bilat SCD's intact. Goal: Sedation Protocol initiated if indicated Outcome: Progressing PRN Fentanyl and Versed IVP. Goal: Hemodynamically stable Levophed gtt started for a couple hrs during shift for hypotension after pt was intubated.  Weaned off later in shift. Goal: Patient tolerating nututrition at goal Panda placed, Oxepa started as ordered. Goal: Patient tolerating weaning plan Pt re-intubated at 0930 this am and placed on ARDS protocol.

## 2012-04-08 NOTE — Progress Notes (Signed)
Inpatient Diabetes Program Recommendations  AACE/ADA: New Consensus Statement on Inpatient Glycemic Control (2013)  Target Ranges:  Prepandial:   less than 140 mg/dL      Peak postprandial:   less than 180 mg/dL (1-2 hours)      Critically ill patients:  140 - 180 mg/dL   Reason for Visit: MD consult regarding home insulin dosing-- however, patient's respiratory status worsened   Inpatient Diabetes Program Recommendations Insulin - Basal: Currently receiving Lantus 5 units twice daily.  Based on am CBG's and lab glucose, this dose seems adequate with no hypoglycemia Insulin - Meal Coverage: Patient unable to eat due to respiratory issues, so tube feedings will be started.  Once patient gets to goal rate of tube feeding, would recommend regularly scheduled tube feed coverage every 4 hours at a 1:10 ratio, or could be a 1:15 ratio given advanced age.  Awaiting dietitian recommendations. HgbA1C: Hgb A1C was 10.5 indicating sub-optimal glycemia control prior to admission  Note: Once tube feeding is determined, will make recommendations regarding amount of tube feed coverage potentially needed.  Will defer home insulin assessment until after patient's condition improves.  Thank you.  Shell Blanchette S. Elsie Lincoln, RN, CNS, CDE  [phone])

## 2012-04-09 ENCOUNTER — Inpatient Hospital Stay (HOSPITAL_COMMUNITY): Payer: Medicare Other

## 2012-04-09 LAB — BLOOD GAS, ARTERIAL
Bicarbonate: 20.6 mEq/L (ref 20.0–24.0)
Drawn by: 31101
PEEP: 10 cmH2O
RATE: 26 resp/min
pH, Arterial: 7.383 (ref 7.350–7.450)
pO2, Arterial: 54.7 mmHg — ABNORMAL LOW (ref 80.0–100.0)

## 2012-04-09 LAB — GLUCOSE, CAPILLARY
Glucose-Capillary: 126 mg/dL — ABNORMAL HIGH (ref 70–99)
Glucose-Capillary: 138 mg/dL — ABNORMAL HIGH (ref 70–99)
Glucose-Capillary: 151 mg/dL — ABNORMAL HIGH (ref 70–99)
Glucose-Capillary: 209 mg/dL — ABNORMAL HIGH (ref 70–99)
Glucose-Capillary: 215 mg/dL — ABNORMAL HIGH (ref 70–99)
Glucose-Capillary: 99 mg/dL (ref 70–99)

## 2012-04-09 LAB — CBC
HCT: 24.5 % — ABNORMAL LOW (ref 39.0–52.0)
Hemoglobin: 8.5 g/dL — ABNORMAL LOW (ref 13.0–17.0)
MCH: 32 pg (ref 26.0–34.0)
MCHC: 34.7 g/dL (ref 30.0–36.0)
MCV: 92.1 fL (ref 78.0–100.0)

## 2012-04-09 LAB — BASIC METABOLIC PANEL
BUN: 14 mg/dL (ref 6–23)
CO2: 22 mEq/L (ref 19–32)
Calcium: 6.8 mg/dL — ABNORMAL LOW (ref 8.4–10.5)
Creatinine, Ser: 0.73 mg/dL (ref 0.50–1.35)
GFR calc non Af Amer: 85 mL/min — ABNORMAL LOW (ref >90)
Glucose, Bld: 120 mg/dL — ABNORMAL HIGH (ref 70–99)

## 2012-04-09 MED ORDER — INSULIN GLARGINE 100 UNIT/ML ~~LOC~~ SOLN
5.0000 [IU] | Freq: Every day | SUBCUTANEOUS | Status: DC
  Administered 2012-04-09 – 2012-04-16 (×8): 5 [IU] via SUBCUTANEOUS

## 2012-04-09 MED ORDER — POTASSIUM CHLORIDE 20 MEQ/15ML (10%) PO LIQD
40.0000 meq | ORAL | Status: AC
  Administered 2012-04-09 (×4): 40 meq
  Filled 2012-04-09 (×4): qty 30

## 2012-04-09 MED ORDER — FUROSEMIDE 10 MG/ML IJ SOLN
40.0000 mg | Freq: Two times a day (BID) | INTRAMUSCULAR | Status: AC
  Administered 2012-04-09 (×2): 40 mg via INTRAVENOUS
  Filled 2012-04-09 (×2): qty 4

## 2012-04-09 MED ORDER — LEVOTHYROXINE SODIUM 75 MCG PO TABS
75.0000 ug | ORAL_TABLET | Freq: Every day | ORAL | Status: DC
  Administered 2012-04-10 – 2012-04-19 (×10): 75 ug
  Filled 2012-04-09 (×12): qty 1

## 2012-04-09 NOTE — Progress Notes (Signed)
Utilization review completed.  

## 2012-04-09 NOTE — Progress Notes (Signed)
Name: [name] MRN: [id number] DOB: 07-14-1929    LOS: 5  Referring Provider:  EDP Reason for Referral:  DKA  PULMONARY / CRITICAL CARE MEDICINE   HPI:  78 yo diabetic sent to [facility] ED with nausea, vomiting and altered mental status.  Apparently was withholding insulin as he did not feel well for several weeks.  Found to be hyperglycemic / acidotic.  Brief patient description:  78 yo diabetic sent to [facility] ED with nausea, vomiting and altered mental status.  Apparently was withholding insulin as he did not feel well for several weeks.  Found to be hyperglycemic / acidotic.  Events Since Admission: 8/1  Admitted with DKA  Current Status: Wakes to voice, intubated, comfortable resp pattern on PRVC   Vital Signs: Temp:  [96.3 F (35.7 C)-98.1 F (36.7 C)] 96.3 F (35.7 C) (08/06 0700) Pulse Rate:  [56-98] 82  (08/06 0700) Resp:  [23-39] 29  (08/06 0700) BP: (75-137)/(42-90) 120/57 mmHg (08/06 0700) SpO2:  [77 %-100 %] 90 % (08/06 0700) FiO2 (%):  [50 %-100 %] 70 % (08/06 0700) Weight:  [75.4 kg (166 lb 3.6 oz)] 75.4 kg (166 lb 3.6 oz) (08/06 0454) 100% NRB CVP:  [3 mmHg-6 mmHg] 3 mmHg Physical Examination: General:  Chronically ill appearing, orally intubated Neuro:  Awake  HEENT:  PERRL, dry membranes, # 8 ETT Neck:  Supple, no JVD Cardiovascular:  RRR, tachycardic Lungs:  Diffuse rales.  Abdomen:  Soft, non tender, bowel sounds present  Musculoskeletal:  Moves all extremities Skin:  R foot ulcer, sacral decubitus ulcer  Active Problems:  Hypertension  Diabetes mellitus  Hypothyroidism  Anemia  DKA (diabetic ketoacidoses)  Acute encephalopathy  Dehydration  Acute renal failure  Diabetes with ulcer of foot  Sacral decubitus ulcer  Non-ST elevation myocardial infarction (NSTEMI), initial episode of care  Septic shock  Acute respiratory failure  ASSESSMENT AND PLAN  PULMONARY  Lab 04/09/12 0400 04/08/12 1040 04/05/12 0530 04/04/12 2231  PHART 7.383 7.392  7.350 7.122*  PCO2ART 35.4 31.4* 20.6* 15.9*  PO2ART 54.7* 56.0* 250.0* 76.0*  HCO3 20.6 18.7* 11.2* 5.0*  O2SAT 87.5 89.2 99.8 91.8   Ventilator Settings: Vent Mode:  [-] PRVC FiO2 (%):  [50 %-100 %] 70 % Set Rate:  [26 bmp] 26 bmp Vt Set:  [470 mL] 470 mL PEEP:  [10 cmH20] 10 cmH20 Plateau Pressure:  [19 cmH20-28 cmH20] 21 cmH20 CXR: diffuse bilateral pulmonary infiltrates. Aeration worse when c/w pre-intubation film on 8/5 ETT:  8/2>>>8/4 ETT:  8/5>>>  A:  Acute respiratory failure.  Severe acidosis, Septic shock skin source. Now with progressive bilateral Pulmonary infiltrates. ARDS +/- element of Pulmonary edema.  P:   Diurese 8/6 per FACTT goals ARDS protocol Sedation protocol.   CARDIOVASCULAR  Lab 04/06/12 1215 04/05/12 1400 04/05/12 0504 04/05/12 0500 04/04/12 2342 04/04/12 2339 04/04/12 1925  TROPONINI 4.09* 13.50* 6.44* -- 1.76* -- 0.43*  LATICACIDVEN -- -- -- 2.9* -- 3.0* 4.8*  PROBNP -- -- -- -- -- -- --   ECG:  None Lines: CVL  L [place] 8/2>>  A:  Combined cardiogenic/septic shock.  NSTEMI.   P:  Cards recommends to treat medically. ASA.  RENAL  Lab 04/09/12 0410 04/08/12 0500 04/07/12 0500 04/07/12 0045 04/06/12 2030  NA 133* 132* 130* 130* 132*  K 3.0* 3.2* -- -- --  CL 103 101 103 104 105  CO2 22 22 18* 16* 18*  BUN 14 13 14 15 15   CREATININE 0.73 0.80 0.71 0.72  0.76  CALCIUM 6.8* 7.2* 7.0* 6.9* 7.0*  MG -- 1.2* -- -- --  PHOS -- 3.1 -- -- --   Intake/Output      08/05 0701 - 08/06 0700 08/06 0701 - 08/07 0700   I.V. (mL/kg) 523.2 (6.9) 20 (0.3)   NG/GT 525 45   IV Piggyback 750    Total Intake(mL/kg) 1798.2 (23.8) 65 (0.9)   Urine (mL/kg/hr) 1025 (0.6) 125   Total Output 1025 125   Net +773.2 -60         Foley:  8/1 >>>  A:  Mild hyponatremia.  Metabolic acidosis.      Hypokalemia Metabolic acidosis improved. Renal fxn holding.  P:   Keep euvolemic Replace K    GASTROINTESTINAL  Lab 04/07/12 0500 04/04/12 1925  AST 21 11  ALT 7  7  ALKPHOS 79 82  BILITOT 0.7 0.1*  PROT 5.5* 7.1  ALBUMIN 1.6* 2.7*    A:  Respiratory status preventing oral intake  P:   Tube feeds.    HEMATOLOGIC  Lab 04/09/12 0410 04/08/12 0500 04/07/12 0500 04/06/12 2030 04/06/12 0610 04/05/12 0945  HGB 8.5* 9.9* 9.2* 8.9* 9.7* --  HCT 24.5* 29.0* 26.5* 25.6* 28.0* --  PLT 186 224 264 266 271 --  INR -- -- -- -- -- 1.15  APTT -- -- -- -- -- 34   A:  Anemia.  No overt hemorrhage. Has had 1 gm drop in last 24h. Not sure if this is actual blood loss or dilution.  P:  Trend CBC Cont PAS  INFECTIOUS  Lab 04/09/12 0410 04/08/12 0500 04/07/12 0500 04/06/12 2030 04/06/12 0610 04/04/12 1905  WBC 6.2 12.1* 17.4* 16.5* 15.2* --  PROCALCITON -- -- -- -- -- 0.33   Cultures: 8/1 Blood >>> 8/1 MRSA screen: negative   Antibiotics: Vancomycin 8/1 (sepsis , skin source)>>> Zosyn 8/1 (sepsis, skin source)>>>  A:  Possible infected diabetic ulcer / sacral decub ulcer. P:   Antibiotics / cultures as above. Trend CBC. Wound care consult advised dressing changes only.  ENDOCRINE  Lab 04/09/12 0724 04/09/12 0329 04/08/12 2336 04/08/12 1935 04/08/12 1533  GLUCAP 99 126* 138* 208* 229*   A:  DM.  DKA secondary to noncompliance (history of noncompliance).  Hypothyroidism. Glucose well controlled now.    P:   Synthroid IV, convert when able to take PO Lantus. Diabetes coordinator.  NEUROLOGIC  Head CT:  8/1 >>> nad  A:  Acute encephalopathy, metabolic and due to sepsis P:   Monitor/supportive care.   BEST PRACTICE / DISPOSITION Level of Care:  ICU Primary Service:  PCCM Consultants:  None Code Status:  Full Diet:  NPO DVT Px:  Heparin  Drip-->SCDs GI Px:  PPI Skin Integrity:  As above - per  wound care consult Social / Family: family updated bedside.  The patient is critically ill with multiple organ systems failure and requires high complexity decision making for assessment and support, frequent evaluation and titration of  therapies, application of advanced monitoring technologies and extensive interpretation of multiple databases. Critical Care Time devoted to patient care services described in this note is 40 minutes.  BABCOCK,PETE,  04/09/2012, 8:21 AM   Attending Addendum:  I have seen the patient, discussed the issues, test results and plans with Kreg Shropshire, NP. I agree with the Assessment and Plans as ammended above.   Levy Pupa, MD, PhD 04/09/2012, 10:24 AM [place] [phone] or if no answer [phone]

## 2012-04-09 NOTE — Progress Notes (Signed)
    SUBJECTIVE: Re-intubated yesterday.   BP 122/57  Pulse 84  Temp 96.4 F (35.8 C) (Core (Comment))  Resp 25  Ht 6' (1.829 m)  Wt 166 lb 3.6 oz (75.4 kg)  BMI 22.54 kg/m2  SpO2 82%  Intake/Output Summary (Last 24 hours) at 04/09/12 0648 Last data filed at 04/09/12 0600  Gross per 24 hour  Intake 1753.18 ml  Output   1025 ml  Net 728.18 ml    PHYSICAL EXAM General: Intubated. Awake.  Neck: No JVD. No masses noted.  Lungs: Coarse BS bilaterally Heart: RRR with no murmurs noted. Abdomen: Bowel sounds are present. Soft, non-tender.  Extremities: No lower extremity edema.   LABS: Basic Metabolic Panel:  Basename 04/09/12 0410 04/08/12 0500  NA 133* 132*  K 3.0* 3.2*  CL 103 101  CO2 22 22  GLUCOSE 120* 163*  BUN 14 13  CREATININE 0.73 0.80  CALCIUM 6.8* 7.2*  MG -- 1.2*  PHOS -- 3.1   CBC:  Basename 04/09/12 0410 04/08/12 0500  WBC 6.2 12.1*  NEUTROABS -- --  HGB 8.5* 9.9*  HCT 24.5* 29.0*  MCV 92.1 93.2  PLT 186 224   Cardiac Enzymes:  Basename 04/06/12 1215  CKTOTAL 252*  CKMB 11.8*  CKMBINDEX --  TROPONINI 4.09*    Current Meds:    . antiseptic oral rinse  15 mL Mouth Rinse QID  . chlorhexidine  15 mL Mouth Rinse BID  . collagenase   Topical Daily  . feeding supplement  30 mL Oral BID WC  . insulin aspart  0-7 Units Subcutaneous Q4H  . insulin aspart  4 Units Subcutaneous Q4H  . insulin glargine  10 Units Subcutaneous BID  . levothyroxine  12.5 mcg Intravenous Daily  . magnesium sulfate 1 - 4 g bolus IVPB  4 g Intravenous Once  . metoprolol tartrate  12.5 mg Per NG tube BID  . pantoprazole (PROTONIX) IV  40 mg Intravenous Q24H  . piperacillin-tazobactam (ZOSYN)  IV  3.375 g Intravenous Q8H  . potassium chloride  10 mEq Intravenous Q1 Hr x 4  . vancomycin  1,000 mg Intravenous Q12H  . DISCONTD: antiseptic oral rinse  15 mL Mouth Rinse QID  . DISCONTD: chlorhexidine  15 mL Mouth Rinse BID  . DISCONTD: insulin aspart  0-3 Units  Subcutaneous Q4H  . DISCONTD: insulin aspart  0-3 Units Subcutaneous Q4H  . DISCONTD: insulin glargine  5 Units Subcutaneous BID     ASSESSMENT AND PLAN:  1) NSTEMI: Cardiac enzymes elevated in setting of DKA/possible sepsis. ASA and heparin were stopped because of bleeding from OG tube over weekend. H/H down this am even further with no obvious source of bleeding.  He is tolerating metoprolol with one episode of hypotension overnight. Given his age and comorbidities, would recommend conservative management in regards to cardiac testing. If he greatly improves, may consider further testing. He likely has obstructive CAD.   2) Respiratory Failure: Back on vent. Per PCCM  3) Acute renal insufficiency: Resolved.     [name]  8/6/20136:48 AM

## 2012-04-09 NOTE — Progress Notes (Signed)
eLink Physician-Brief Progress Note Patient Name: [name] DOB: [birth year] MRN: [id number]  Date of Service  04/09/2012   HPI/Events of Note   CBGs rising  eICU Interventions  Lantus 5 units SQ ordered QHs   Intervention Category Major Interventions: Hyperglycemia - active titration of insulin therapy  Shan Levans 04/09/2012, 7:37 PM

## 2012-04-09 NOTE — Progress Notes (Signed)
SLP Eval Cancellation Note  Eval  cancelled today due to pt remains on vent.  SLP to sign off, please reorder when pt extubated if desire. Thanks. Donavan Burnet, MS [facility] SLP [phone]

## 2012-04-10 ENCOUNTER — Inpatient Hospital Stay (HOSPITAL_COMMUNITY): Payer: Medicare Other

## 2012-04-10 LAB — COMPREHENSIVE METABOLIC PANEL
ALT: 8 U/L (ref 0–53)
AST: 15 U/L (ref 0–37)
Albumin: 1.6 g/dL — ABNORMAL LOW (ref 3.5–5.2)
CO2: 25 mEq/L (ref 19–32)
Calcium: 7.7 mg/dL — ABNORMAL LOW (ref 8.4–10.5)
Chloride: 99 mEq/L (ref 96–112)
GFR calc non Af Amer: 81 mL/min — ABNORMAL LOW (ref >90)
Sodium: 132 mEq/L — ABNORMAL LOW (ref 135–145)

## 2012-04-10 LAB — BLOOD GAS, ARTERIAL
Drawn by: 362061
MECHVT: 470 mL
O2 Saturation: 91.8 %
PEEP: 14 cmH2O
Patient temperature: 35.9
RATE: 26 resp/min
pO2, Arterial: 56.5 mmHg — ABNORMAL LOW (ref 80.0–100.0)

## 2012-04-10 LAB — CBC
MCH: 31.4 pg (ref 26.0–34.0)
Platelets: 201 10*3/uL (ref 150–400)
RBC: 2.77 MIL/uL — ABNORMAL LOW (ref 4.22–5.81)
WBC: 6.7 10*3/uL (ref 4.0–10.5)

## 2012-04-10 LAB — GLUCOSE, CAPILLARY
Glucose-Capillary: 105 mg/dL — ABNORMAL HIGH (ref 70–99)
Glucose-Capillary: 112 mg/dL — ABNORMAL HIGH (ref 70–99)
Glucose-Capillary: 136 mg/dL — ABNORMAL HIGH (ref 70–99)
Glucose-Capillary: 171 mg/dL — ABNORMAL HIGH (ref 70–99)
Glucose-Capillary: 177 mg/dL — ABNORMAL HIGH (ref 70–99)
Glucose-Capillary: 190 mg/dL — ABNORMAL HIGH (ref 70–99)

## 2012-04-10 MED ORDER — SODIUM CHLORIDE 0.9 % IV SOLN
2.0000 mg/h | INTRAVENOUS | Status: DC
  Administered 2012-04-10 – 2012-04-11 (×2): 2 mg/h via INTRAVENOUS
  Filled 2012-04-10 (×3): qty 10

## 2012-04-10 MED ORDER — MIDAZOLAM BOLUS VIA INFUSION
1.0000 mg | INTRAVENOUS | Status: DC | PRN
  Filled 2012-04-10: qty 2

## 2012-04-10 MED ORDER — POTASSIUM CHLORIDE 20 MEQ/15ML (10%) PO LIQD
20.0000 meq | Freq: Three times a day (TID) | ORAL | Status: DC
  Administered 2012-04-10 – 2012-04-15 (×15): 20 meq
  Filled 2012-04-10 (×19): qty 15

## 2012-04-10 MED ORDER — FUROSEMIDE 10 MG/ML IJ SOLN
40.0000 mg | Freq: Three times a day (TID) | INTRAMUSCULAR | Status: DC
  Administered 2012-04-10 – 2012-04-11 (×3): 40 mg via INTRAVENOUS
  Filled 2012-04-10 (×6): qty 4

## 2012-04-10 MED ORDER — SODIUM CHLORIDE 0.9 % IV SOLN
50.0000 ug/h | INTRAVENOUS | Status: DC
  Administered 2012-04-10: 50 ug/h via INTRAVENOUS
  Filled 2012-04-10 (×2): qty 50

## 2012-04-10 MED ORDER — SODIUM CHLORIDE 0.9 % IV BOLUS (SEPSIS)
2000.0000 mL | Freq: Once | INTRAVENOUS | Status: AC
  Administered 2012-04-10: 2000 mL via INTRAVENOUS

## 2012-04-10 MED ORDER — POLYETHYLENE GLYCOL 3350 17 G PO PACK
17.0000 g | PACK | Freq: Every day | ORAL | Status: DC
  Administered 2012-04-10 – 2012-04-14 (×5): 17 g
  Filled 2012-04-10 (×6): qty 1

## 2012-04-10 MED ORDER — FENTANYL BOLUS VIA INFUSION
50.0000 ug | Freq: Four times a day (QID) | INTRAVENOUS | Status: DC | PRN
  Filled 2012-04-10: qty 100

## 2012-04-10 NOTE — Progress Notes (Signed)
eLink Physician-Brief Progress Note Patient Name: [name] DOB: 08-Dec-1928 MRN: [id number]  Date of Service  04/10/2012   HPI/Events of Note   PT agitated on intermittent sedation and obstipated  eICU Interventions  Miralax Change to continuous sedation   Intervention Category Major Interventions: Delirium, psychosis, severe agitation - evaluation and management  Shan Levans 04/10/2012, 4:49 PM

## 2012-04-10 NOTE — Progress Notes (Addendum)
Name: [name] MRN: [id number] DOB: [birth year]    LOS: 6  Referring Provider:  EDP Reason for Referral:  DKA  PULMONARY / CRITICAL CARE MEDICINE   HPI:  78 yo diabetic sent to [facility] ED with nausea, vomiting and altered mental status.  Apparently was withholding insulin as he did not feel well for several weeks.  Found to be hyperglycemic / acidotic.  Brief patient description:  78 yo diabetic sent to [facility] ED with nausea, vomiting and altered mental status.  Apparently was withholding insulin as he did not feel well for several weeks.  Found to be hyperglycemic / acidotic.  Events Since Admission: 8/1  Admitted with DKA  Current Status: No distress.    Vital Signs: Temp:  [95.7 F (35.4 C)-97.5 F (36.4 C)] 96.3 F (35.7 C) (08/07 0700) Pulse Rate:  [69-98] 74  (08/07 0700) Resp:  [27-34] 27  (08/07 0700) BP: (100-148)/(48-77) 116/55 mmHg (08/07 0700) SpO2:  [87 %-98 %] 96 % (08/07 0700) FiO2 (%):  [70 %] 70 % (08/07 0800) Weight:  [74.7 kg (164 lb 10.9 oz)] 74.7 kg (164 lb 10.9 oz) (08/07 0300) 100% NRB CVP:  [2 mmHg-9 mmHg] 6 mmHg Physical Examination: General:  Chronically ill appearing, orally intubated Neuro:  Awake  HEENT:  PERRL, dry membranes, # 8 ETT Neck:  Supple, no JVD Cardiovascular:  RRR, tachycardic Lungs:  Diffuse rales. Scattered rhonchi Abdomen:  Soft, non tender, bowel sounds present  Musculoskeletal:  Moves all extremities Skin:  R foot ulcer, sacral decubitus ulcer  Active Problems:  Hypertension  Diabetes mellitus  Hypothyroidism  Anemia  DKA (diabetic ketoacidoses)  Acute encephalopathy  Dehydration  Acute renal failure  Diabetes with ulcer of foot  Sacral decubitus ulcer  Non-ST elevation myocardial infarction (NSTEMI), initial episode of care  Septic shock  Acute respiratory failure  ASSESSMENT AND PLAN  PULMONARY  Lab 04/10/12 0438 04/09/12 0400 04/08/12 1040 04/05/12 0530 04/04/12 2231  PHART 7.445 7.383 7.392 7.350 7.122*    PCO2ART 35.4 35.4 31.4* 20.6* 15.9*  PO2ART 56.5* 54.7* 56.0* 250.0* 76.0*  HCO3 24.2* 20.6 18.7* 11.2* 5.0*  O2SAT 91.8 87.5 89.2 99.8 91.8   Ventilator Settings: Vent Mode:  [-] PRVC FiO2 (%):  [70 %] 70 % Set Rate:  [26 bmp] 26 bmp Vt Set:  [470 mL] 470 mL PEEP:  [10 cmH20-14 cmH20] 14 cmH20 Plateau Pressure:  [21 cmH20-37 cmH20] 27 cmH20 CXR: diffuse bilateral pulmonary infiltrates. Aeration unchanged ETT:  8/2>>>8/4 ETT:  8/5>>>  A:  Acute respiratory failure.  Severe acidosis, Septic shock skin source. Now with progressive bilateral Pulmonary infiltrates c/w ARDS P:   Diurese 8/6 per FACTT goals ARDS protocol Sedation protocol.  Check ESR  CARDIOVASCULAR  Lab 04/09/12 0930 04/06/12 1215 04/05/12 1400 04/05/12 0504 04/05/12 0500 04/04/12 2342 04/04/12 2339 04/04/12 1925  TROPONINI -- 4.09* 13.50* 6.44* -- 1.76* -- 0.43*  LATICACIDVEN 1.5 -- -- -- 2.9* -- 3.0* 4.8*  PROBNP -- -- -- -- -- -- -- --   ECG:  None Lines: CVL  L [place] 8/2>>  A:  Septic shock with NSTEMI and superimposed cardiogenic shock on presentation.  P:  Cards recommends to treat CAD medically. ASA.  RENAL  Lab 04/10/12 0325 04/09/12 0410 04/08/12 0500 04/07/12 0500 04/07/12 0045  NA 132* 133* 132* 130* 130*  K 3.8 3.0* -- -- --  CL 99 103 101 103 104  CO2 25 22 22  18* 16*  BUN 20 14 13 14 15   CREATININE 0.81  0.73 0.80 0.71 0.72  CALCIUM 7.7* 6.8* 7.2* 7.0* 6.9*  MG -- -- 1.2* -- --  PHOS -- -- 3.1 -- --   Intake/Output      08/06 0701 - 08/07 0700 08/07 0701 - 08/08 0700   I.V. (mL/kg) 320 (4.3)    NG/GT 1130    IV Piggyback 554    Total Intake(mL/kg) 2004 (26.8)    Urine (mL/kg/hr) 4950 (2.8)    Total Output 4950    Net -2946          Foley:  8/1 >>>  A:  Mild hyponatremia. Renal fxn holding.  P:   Aiming for negative fluid balance- Close obs of chemistry and K replacement   GASTROINTESTINAL  Lab 04/10/12 0325 04/07/12 0500 04/04/12 1925  AST 15 21 11   ALT 8 7 7   ALKPHOS  102 79 82  BILITOT 0.5 0.7 0.1*  PROT 5.7* 5.5* 7.1  ALBUMIN 1.6* 1.6* 2.7*    A: Protein cal malnutrition P:   Tube feeds.    HEMATOLOGIC  Lab 04/10/12 0325 04/09/12 0410 04/08/12 0500 04/07/12 0500 04/06/12 2030 04/05/12 0945  HGB 8.7* 8.5* 9.9* 9.2* 8.9* --  HCT 25.5* 24.5* 29.0* 26.5* 25.6* --  PLT 201 186 224 264 266 --  INR -- -- -- -- -- 1.15  APTT -- -- -- -- -- 34   A:  Anemia.  No overt hemorrhage. Has had 1 gm drop in last 24h. Not sure if this is actual blood loss or dilution.  P:  Trend CBC Cont PAS  INFECTIOUS  Lab 04/10/12 0325 04/09/12 0410 04/08/12 0500 04/07/12 0500 04/06/12 2030 04/04/12 1905  WBC 6.7 6.2 12.1* 17.4* 16.5* --  PROCALCITON 2.01 -- -- -- -- 0.33   Cultures: 8/1 Blood >>> 8/1 MRSA screen: negative   Antibiotics: Vancomycin 8/1 (sepsis , skin source)>>> Zosyn 8/1 (sepsis, skin source)>>>  A:  Possible infected diabetic ulcer / sacral decub ulcer. P:   Antibiotics / cultures as above. Will stop vanc on 7/8 (completing 7 day rx, no orgs found)  ENDOCRINE  Lab 04/10/12 0326 04/09/12 2358 04/09/12 1927 04/09/12 1521 04/09/12 1158  GLUCAP 112* 177* 215* 209* 151*   A:  DM.  DKA secondary to noncompliance (history of noncompliance).  Hypothyroidism. Glucose well controlled now.    P:   Synthroid IV, convert when able to take PO Lantus. Diabetes coordinator.  NEUROLOGIC  Head CT:  8/1 >>> nad  A:  Acute encephalopathy, metabolic and due to sepsis P:   Monitor/supportive care.   BEST PRACTICE / DISPOSITION Level of Care:  ICU Primary Service:  PCCM Consultants:  None Code Status:  Full Diet:  NPO DVT Px:  Heparin  Drip-->SCDs GI Px:  PPI Skin Integrity:  As above - per  wound care consult Social / Family: family updated bedside.    Attending Addendum:  I have seen the patient, discussed the issues, test results and plans with Kreg Shropshire, NP. I agree with the Assessment and Plans as ammended above.   Levy Pupa,  MD, PhD 04/10/2012, 12:21 PM  Pulmonary and Critical Care [phone] or if no answer [phone]

## 2012-04-10 NOTE — Progress Notes (Signed)
    SUBJECTIVE: Pt awake on vent. Unable to voice complaints.   BP 100/52  Pulse 77  Temp 96.8 F (36 C) (Core (Comment))  Resp 28  Ht 6' (1.829 m)  Wt 164 lb 10.9 oz (74.7 kg)  BMI 22.34 kg/m2  SpO2 98%  Intake/Output Summary (Last 24 hours) at 04/10/12 0709 Last data filed at 04/10/12 0700  Gross per 24 hour  Intake   2004 ml  Output   4950 ml  Net  -2946 ml    PHYSICAL EXAM General: Awake on vent Lungs: Coarse BS bilaterally  Heart: RRR with no loud murmurs noted. Abdomen: Bowel sounds are present. Soft, non-tender.  Extremities: No lower extremity edema. Right foot ulcer  LABS: Basic Metabolic Panel:  Basename 04/10/12 0325 04/09/12 0410 04/08/12 0500  NA 132* 133* --  K 3.8 3.0* --  CL 99 103 --  CO2 25 22 --  GLUCOSE 115* 120* --  BUN 20 14 --  CREATININE 0.81 0.73 --  CALCIUM 7.7* 6.8* --  MG -- -- 1.2*  PHOS -- -- 3.1   CBC:  Basename 04/10/12 0325 04/09/12 0410  WBC 6.7 6.2  NEUTROABS -- --  HGB 8.7* 8.5*  HCT 25.5* 24.5*  MCV 92.1 92.1  PLT 201 186   Current Meds:    . antiseptic oral rinse  15 mL Mouth Rinse QID  . chlorhexidine  15 mL Mouth Rinse BID  . collagenase   Topical Daily  . feeding supplement  30 mL Oral BID WC  . furosemide  40 mg Intravenous Q12H  . insulin aspart  0-7 Units Subcutaneous Q4H  . insulin aspart  4 Units Subcutaneous Q4H  . insulin glargine  5 Units Subcutaneous QHS  . levothyroxine  75 mcg Per Tube QAC breakfast  . metoprolol tartrate  12.5 mg Per NG tube BID  . pantoprazole (PROTONIX) IV  40 mg Intravenous Q24H  . piperacillin-tazobactam (ZOSYN)  IV  3.375 g Intravenous Q8H  . potassium chloride  40 mEq Per Tube Q4H  . vancomycin  1,000 mg Intravenous Q12H  . DISCONTD: insulin glargine  10 Units Subcutaneous BID  . DISCONTD: levothyroxine  12.5 mcg Intravenous Daily     ASSESSMENT AND PLAN:  1) NSTEMI: Cardiac enzymes elevated in setting of DKA/possible sepsis/anemia. ASA and heparin were stopped  because of bleeding from OG tube over weekend.  He is tolerating metoprolol. He likely has obstructive CAD.  Would restart ASA if no further evidence of bleeding. Given his age and comorbidities, would recommend conservative management in regards to cardiac testing. He does not appear to be a good candidate for invasive cardiac workup.  If he greatly improves, may consider further testing.   2) VDRF: Back on vent. Per PCCM. Probable sepsis with ARDS from skin source.   Will sign off for now. Please call with questions (pager [phone])    [name]  8/7/20137:09 AM

## 2012-04-10 NOTE — Progress Notes (Signed)
ANTIBIOTIC CONSULT NOTE - FOLLOW UP  Pharmacy Consult for Vancomycin/Zosyn Indication: Sepsis, skin source  No Known Allergies  Patient Measurements: Height: 6' (182.9 cm) Weight: 164 lb 10.9 oz (74.7 kg) IBW/kg (Calculated) : 77.6   Vital Signs: Temp: 96.6 F (35.9 C) (08/07 1200) Temp src: Core (Comment) (08/07 1200) BP: 136/55 mmHg (08/07 1200) Pulse Rate: 78  (08/07 1200) Intake/Output from previous day: 08/06 0701 - 08/07 0700 In: 2004 [I.V.:320; NG/GT:1130; IV Piggyback:554] Out: 4950 [Urine:4950] Intake/Output from this shift: Total I/O In: 159 [I.V.:40; NG/GT:90; IV Piggyback:29] Out: 160 [Urine:160]  Labs:  Basename 04/10/12 0325 04/09/12 0410 04/08/12 0500  WBC 6.7 6.2 12.1*  HGB 8.7* 8.5* 9.9*  PLT 201 186 224  LABCREA -- -- --  CREATININE 0.81 0.73 0.80   Estimated Creatinine Clearance: 75.6 ml/min (by C-G formula based on Cr of 0.81).  82 ml/mim/1.19m2 (normalized)   Microbiology: Recent Results (from the past 720 hour(s))  URINE CULTURE     Status: Normal   Collection Time   04/04/12  7:57 PM      Component Value Range Status Comment   Specimen Description URINE, CLEAN CATCH   Final    Special Requests NONE   Final    Culture  Setup Time 04/05/2012 01:54   Final    Colony Count NO GROWTH   Final    Culture NO GROWTH   Final    Report Status 04/05/2012 FINAL   Final   CULTURE, BLOOD (ROUTINE X 2)     Status: Normal (Preliminary result)   Collection Time   04/04/12  8:20 PM      Component Value Range Status Comment   Specimen Description BLOOD LEFT HAND   Final    Special Requests BOTTLES DRAWN AEROBIC ONLY 3CC   Final    Culture  Setup Time 04/05/2012 02:15   Final    Culture     Final    Value:        BLOOD CULTURE RECEIVED NO GROWTH TO DATE CULTURE WILL BE HELD FOR 5 DAYS BEFORE ISSUING A FINAL NEGATIVE REPORT   Report Status PENDING   Incomplete   CULTURE, BLOOD (ROUTINE X 2)     Status: Normal (Preliminary result)   Collection Time   04/04/12   8:48 PM      Component Value Range Status Comment   Specimen Description BLOOD RIGHT ARM   Final    Special Requests BOTTLES DRAWN AEROBIC ONLY 3CC   Final    Culture  Setup Time 04/05/2012 02:15   Final    Culture     Final    Value:        BLOOD CULTURE RECEIVED NO GROWTH TO DATE CULTURE WILL BE HELD FOR 5 DAYS BEFORE ISSUING A FINAL NEGATIVE REPORT   Report Status PENDING   Incomplete   MRSA PCR SCREENING     Status: Normal   Collection Time   04/04/12 11:28 PM      Component Value Range Status Comment   MRSA by PCR NEGATIVE  NEGATIVE Final   RAPID STREP SCREEN     Status: Normal   Collection Time   04/05/12 12:08 AM      Component Value Range Status Comment   Streptococcus, Group A Screen (Direct) NEGATIVE  NEGATIVE Final     Anti-infectives     Start     Dose/Rate Route Frequency Ordered Stop   04/06/12 1200   vancomycin (VANCOCIN) IVPB 1000 mg/200 mL premix  1,000 mg 200 mL/hr over 60 Minutes Intravenous Every 12 hours 04/06/12 0717     04/04/12 2300   vancomycin (VANCOCIN) IVPB 1000 mg/200 mL premix  Status:  Discontinued        1,000 mg 200 mL/hr over 60 Minutes Intravenous Every 24 hours 04/04/12 2231 04/06/12 0717   04/04/12 2300   piperacillin-tazobactam (ZOSYN) IVPB 3.375 g        3.375 g 12.5 mL/hr over 240 Minutes Intravenous Every 8 hours 04/04/12 2231            Assessment:  78yo M with diabetes, admitted with DKA.  On day #6 Vanc and Zosyn for sepsis, likely source is multiple decubiti.  Afebrile, WBC improved to wnl.  SCr wnl, CrCl 60ml/min.  All cultures negative to date.  Goal of Therapy:  Vancomycin trough level 15-20 mcg/ml  Plan:   Cont Vanc 1g IV q12h. Planning to stop 8/8.  Cont Zosyn 3.375g IV Q8H infused over 4hrs. Follow up renal function & cultures.  Charolotte Eke, PharmD, pager [phone]. 04/10/2012,12:38 PM.

## 2012-04-11 ENCOUNTER — Inpatient Hospital Stay (HOSPITAL_COMMUNITY): Payer: Medicare Other

## 2012-04-11 LAB — COMPREHENSIVE METABOLIC PANEL
ALT: 9 U/L (ref 0–53)
Alkaline Phosphatase: 103 U/L (ref 39–117)
BUN: 24 mg/dL — ABNORMAL HIGH (ref 6–23)
CO2: 26 mEq/L (ref 19–32)
GFR calc Af Amer: >90 mL/min (ref >90)
GFR calc non Af Amer: 84 mL/min — ABNORMAL LOW (ref >90)
Glucose, Bld: 195 mg/dL — ABNORMAL HIGH (ref 70–99)
Potassium: 3.5 mEq/L (ref 3.5–5.1)
Sodium: 136 mEq/L (ref 135–145)
Total Bilirubin: 0.4 mg/dL (ref 0.3–1.2)

## 2012-04-11 LAB — GLUCOSE, CAPILLARY
Glucose-Capillary: 126 mg/dL — ABNORMAL HIGH (ref 70–99)
Glucose-Capillary: 137 mg/dL — ABNORMAL HIGH (ref 70–99)
Glucose-Capillary: 171 mg/dL — ABNORMAL HIGH (ref 70–99)
Glucose-Capillary: 176 mg/dL — ABNORMAL HIGH (ref 70–99)
Glucose-Capillary: 178 mg/dL — ABNORMAL HIGH (ref 70–99)
Glucose-Capillary: 97 mg/dL (ref 70–99)

## 2012-04-11 LAB — CBC
HCT: 23.1 % — ABNORMAL LOW (ref 39.0–52.0)
Hemoglobin: 7.9 g/dL — ABNORMAL LOW (ref 13.0–17.0)
RBC: 2.54 MIL/uL — ABNORMAL LOW (ref 4.22–5.81)

## 2012-04-11 LAB — CULTURE, BLOOD (ROUTINE X 2)
Culture: NO GROWTH
Culture: NO GROWTH

## 2012-04-11 LAB — BLOOD GAS, ARTERIAL
Bicarbonate: 24.6 mEq/L — ABNORMAL HIGH (ref 20.0–24.0)
TCO2: 23.4 mmol/L (ref 0–100)
pCO2 arterial: 37.2 mmHg (ref 35.0–45.0)
pH, Arterial: 7.427 (ref 7.350–7.450)
pO2, Arterial: 77.3 mmHg — ABNORMAL LOW (ref 80.0–100.0)

## 2012-04-11 LAB — SEDIMENTATION RATE: Sed Rate: 120 mm/hr — ABNORMAL HIGH (ref 0–16)

## 2012-04-11 LAB — PROCALCITONIN: Procalcitonin: 1.4 ng/mL

## 2012-04-11 LAB — PRO B NATRIURETIC PEPTIDE: Pro B Natriuretic peptide (BNP): 3477 pg/mL — ABNORMAL HIGH (ref 0–450)

## 2012-04-11 NOTE — Progress Notes (Signed)
Results for [name], [name] (MRN [id number]) as of 04/11/2012 11:04  Ref. Range 04/09/2012 23:58 04/10/2012 03:26 04/10/2012 07:29 04/10/2012 11:55 04/10/2012 15:43 04/10/2012 19:29  Glucose-Capillary Latest Range: 70-99 mg/dL 409 (H) 811 (H) 914 (H) 136 (H) 190 (H) 171 (H)    Results for [name], [name] (MRN [id number]) as of 04/11/2012 11:04  Ref. Range 04/10/2012 23:48 04/11/2012 03:33 04/11/2012 07:37  Glucose-Capillary Latest Range: 70-99 mg/dL 086 (H) 578 (H) 469 (H)    Patient's CBGs remain within range on current regimen of: Lantus 5 units QHS Novolog ICU SSI Q4 hours Novolog 4 units Q4 hours (tube feed coverage)  Will follow and assist as needed. Ambrose Finland RN, MSN, CDE Diabetes Coordinator Inpatient Diabetes Program [phone]

## 2012-04-11 NOTE — Progress Notes (Signed)
Patient continues to drop into the mid 80's sp02, recruitment maneuver performed, now sp02 100%.

## 2012-04-11 NOTE — Progress Notes (Signed)
Name: [name] MRN: [id number] DOB: [birth year]    LOS: 7  Referring Provider:  EDP Reason for Referral:  DKA  PULMONARY / CRITICAL CARE MEDICINE   HPI:  78 yo diabetic sent to [facility] ED with nausea, vomiting and altered mental status.  Apparently was withholding insulin as he did not feel well for several weeks.  Found to be hyperglycemic / acidotic. Treated for DKA but course also significant for sepsis due to skin source vs PNA, NSTEMI with shock (septic + cardiogenic), resp failure.   Brief patient description:  78 yo diabetic sent to [facility] ED with nausea, vomiting and altered mental status. Treated for DKA but course also significant for sepsis due to skin source vs PNA, NSTEMI with shock (septic + cardiogenic), resp failure.   Current Status: Continued overall decline with worsening infiltrates, increased sedation needs >> resulted in restart norepi 8/7-8  Vital Signs: Temp:  [93.9 F (34.4 C)-96.6 F (35.9 C)] 96.6 F (35.9 C) (08/08 0823) Pulse Rate:  [55-88] 68  (08/08 0823) Resp:  [22-38] 24  (08/08 0823) BP: (54-142)/(31-65) 111/51 mmHg (08/08 0823) SpO2:  [79 %-100 %] 95 % (08/08 0823) FiO2 (%):  [60 %-100 %] 70 % (08/08 0930) Weight:  [75.1 kg (165 lb 9.1 oz)] 75.1 kg (165 lb 9.1 oz) (08/08 0500) 100% NRB CVP:  [9 mmHg-17 mmHg] 17 mmHg  Physical Examination: General:  Chronically ill appearing, orally intubated Neuro:  Sedated, minimal response to voice HEENT:  PERRL, dry membranes, # 8 ETT Neck:  Supple, no JVD Cardiovascular:  RRR, tachycardic Lungs:  Diffuse rales. Scattered rhonchi Abdomen:  Soft, non tender, bowel sounds present  Musculoskeletal:  Moves all extremities Skin:  R foot ulcer, sacral decubitus ulcer  Active Problems:  Hypertension  Diabetes mellitus  Hypothyroidism  Anemia  DKA (diabetic ketoacidoses)  Acute encephalopathy  Dehydration  Acute renal failure  Diabetes with ulcer of foot  Sacral decubitus ulcer  Non-ST elevation  myocardial infarction (NSTEMI), initial episode of care  Septic shock  Acute respiratory failure  ASSESSMENT AND PLAN  PULMONARY  Lab 04/11/12 0358 04/10/12 0438 04/09/12 0400 04/08/12 1040 04/05/12 0530  PHART 7.427 7.445 7.383 7.392 7.350  PCO2ART 37.2 35.4 35.4 31.4* 20.6*  PO2ART 77.3* 56.5* 54.7* 56.0* 250.0*  HCO3 24.6* 24.2* 20.6 18.7* 11.2*  O2SAT 96.9 91.8 87.5 89.2 99.8   Ventilator Settings: Vent Mode:  [-] PRVC FiO2 (%):  [60 %-100 %] 70 % Set Rate:  [26 bmp] 26 bmp Vt Set:  [470 mL] 470 mL PEEP:  [10 cmH20] 10 cmH20 Plateau Pressure:  [19 cmH20-32 cmH20] 22 cmH20 CXR: diffuse bilateral pulmonary infiltrates. Aeration unchanged ETT:  8/2>>>8/4 ETT:  8/5>>>  A:  Acute respiratory failure.  Severe acidosis, Septic shock skin source. Now with progressive bilateral Pulmonary infiltrates c/w ARDS P:   Diurese as tolerated per FACTT goals, hold 8/8 due to worsening hypotension ARDS protocol Sedation protocol >> changed to continuous 8/7.   CARDIOVASCULAR  Lab 04/11/12 0420 04/09/12 0930 04/06/12 1215 04/05/12 1400 04/05/12 0504 04/05/12 0500 04/04/12 2342 04/04/12 2339 04/04/12 1925  TROPONINI -- -- 4.09* 13.50* 6.44* -- 1.76* -- 0.43*  LATICACIDVEN -- 1.5 -- -- -- 2.9* -- 3.0* 4.8*  PROBNP 3477.0* -- -- -- -- -- -- -- --   ECG:  None Lines: CVL  L [place] 8/2>>  A:  Septic shock with NSTEMI and superimposed cardiogenic shock on presentation.  P:  Cards recommends to treat CAD medically. ASA. norepi restarted 8/7; may  need to liberalize CVP goal to 8-12  RENAL  Lab 04/11/12 0420 04/10/12 0325 04/09/12 0410 04/08/12 0500 04/07/12 0500  NA 136 132* 133* 132* 130*  K 3.5 3.8 -- -- --  CL 101 99 103 101 103  CO2 26 25 22 22  18*  BUN 24* 20 14 13 14   CREATININE 0.74 0.81 0.73 0.80 0.71  CALCIUM 7.7* 7.7* 6.8* 7.2* 7.0*  MG -- -- -- 1.2* --  PHOS -- -- -- 3.1 --   Intake/Output      08/07 0701 - 08/08 0700 08/08 0701 - 08/09 0700   I.V. (mL/kg) 784.6 (10.4)      NG/GT 1050    IV Piggyback 566    Total Intake(mL/kg) 2400.6 (32)    Urine (mL/kg/hr) 4750 (2.6)    Total Output 4750    Net -2349.4          Foley:  8/1 >>>  A:  Mild hyponatremia. Renal fxn holding.  P:   Aiming for negative fluid balance but may need to adjust as BP dropping 8/7- Close obs of chemistry and K replacement   GASTROINTESTINAL  Lab 04/11/12 0420 04/10/12 0325 04/07/12 0500 04/04/12 1925  AST 22 15 21 11   ALT 9 8 7 7   ALKPHOS 103 102 79 82  BILITOT 0.4 0.5 0.7 0.1*  PROT 5.7* 5.7* 5.5* 7.1  ALBUMIN 1.5* 1.6* 1.6* 2.7*    A: Protein cal malnutrition P:   Tube feeds.    HEMATOLOGIC  Lab 04/11/12 0420 04/10/12 0325 04/09/12 0410 04/08/12 0500 04/07/12 0500 04/05/12 0945  HGB 7.9* 8.7* 8.5* 9.9* 9.2* --  HCT 23.1* 25.5* 24.5* 29.0* 26.5* --  PLT 196 201 186 224 264 --  INR -- -- -- -- -- 1.15  APTT -- -- -- -- -- 34   A:  Anemia.  No overt hemorrhage.  P:  Trend CBC Cont PAS  INFECTIOUS  Lab 04/11/12 0420 04/10/12 0325 04/09/12 0410 04/08/12 0500 04/07/12 0500 04/04/12 1905  WBC 10.4 6.7 6.2 12.1* 17.4* --  PROCALCITON 1.40 2.01 -- -- -- 0.33   Cultures: 8/1 Blood >>> negative 8/1 MRSA screen: negative   Antibiotics: Vancomycin 8/1 (sepsis , skin source)>>> Zosyn 8/1 (sepsis, skin source)>>> 8/8  A:  Possible infected diabetic ulcer / sacral decub ulcer. P:   Antibiotics / cultures as above. Will stop vanc on 8/8 (completing 7 day rx, no orgs found)  ENDOCRINE  Lab 04/11/12 0737 04/11/12 0333 04/10/12 2348 04/10/12 1929 04/10/12 1543  GLUCAP 178* 176* 171* 171* 190*   A:  DM.  DKA secondary to noncompliance (history of noncompliance).  Hypothyroidism. Glucose well controlled now.  P:   Synthroid IV, convert when able to take PO Lantus. Diabetes coordinator.  NEUROLOGIC Head CT:  8/1 >>> nad  A:  Acute encephalopathy, metabolic and due to sepsis P:   Monitor/supportive care.   GLOBAL/GOALS OF CARE: Spoke with patient's  daughter [name] by phone 8/8. Explained his current status, slow decline over last several says, prognosis for meaningful recovery. She understands and voice that patient and family would not want care that did not offer chance for good quality life. She asked how long we should continue to support in this fashion. The involved family includes pt's wife, other children, some nieces/nephews. I have asked [name] to organize a family meeting, notify us when they can meet. In meantime we agreed that he should not undergo CPR, defib. We will continue all other aggressive care.  BEST PRACTICE / DISPOSITION Level of Care:  ICU Primary Service:  PCCM Consultants:  None Code Status:  Limited >> no CPR/defib Diet: TF's DVT Px:  Heparin  Drip-->SCDs GI Px:  PPI Skin Integrity:  As above - per  wound care consult Social / Family: spoke with daughter [name] 8/8   Levy Pupa, MD, PhD 04/11/2012, 10:17 AM [facility] [phone] or if no answer [phone]

## 2012-04-12 ENCOUNTER — Inpatient Hospital Stay (HOSPITAL_COMMUNITY): Payer: Medicare Other

## 2012-04-12 LAB — GLUCOSE, CAPILLARY
Glucose-Capillary: 100 mg/dL — ABNORMAL HIGH (ref 70–99)
Glucose-Capillary: 123 mg/dL — ABNORMAL HIGH (ref 70–99)
Glucose-Capillary: 130 mg/dL — ABNORMAL HIGH (ref 70–99)
Glucose-Capillary: 152 mg/dL — ABNORMAL HIGH (ref 70–99)
Glucose-Capillary: 219 mg/dL — ABNORMAL HIGH (ref 70–99)
Glucose-Capillary: 91 mg/dL (ref 70–99)

## 2012-04-12 LAB — BASIC METABOLIC PANEL
BUN: 29 mg/dL — ABNORMAL HIGH (ref 6–23)
Calcium: 7.7 mg/dL — ABNORMAL LOW (ref 8.4–10.5)
Chloride: 99 mEq/L (ref 96–112)
Creatinine, Ser: 0.84 mg/dL (ref 0.50–1.35)
GFR calc Af Amer: >90 mL/min (ref >90)

## 2012-04-12 LAB — CBC
MCH: 31.7 pg (ref 26.0–34.0)
MCV: 91.7 fL (ref 78.0–100.0)
Platelets: 156 10*3/uL (ref 150–400)
RBC: 2.4 MIL/uL — ABNORMAL LOW (ref 4.22–5.81)

## 2012-04-12 LAB — ABO/RH: ABO/RH(D): O POS

## 2012-04-12 MED ORDER — SODIUM CHLORIDE 3 % IN NEBU
INHALATION_SOLUTION | RESPIRATORY_TRACT | Status: AC
  Filled 2012-04-12: qty 15

## 2012-04-12 MED ORDER — FUROSEMIDE 10 MG/ML IJ SOLN
40.0000 mg | Freq: Once | INTRAMUSCULAR | Status: AC
  Administered 2012-04-12: 40 mg via INTRAVENOUS
  Filled 2012-04-12: qty 4

## 2012-04-12 NOTE — Progress Notes (Signed)
The patients SpO2 was 83%. Recruitment maneuver was performed with patient remaining stable and tolerating well. Patient sat increased to 98%.

## 2012-04-12 NOTE — Progress Notes (Signed)
Name: [name] MRN: [id number] DOB: August 27, 1929    LOS: 8  Referring Provider:  EDP Reason for Referral:  DKA  PULMONARY / CRITICAL CARE MEDICINE   HPI:  78 yo diabetic sent to [facility] ED with nausea, vomiting and altered mental status.  Apparently was withholding insulin as he did not feel well for several weeks.  Found to be hyperglycemic / acidotic. Treated for DKA but course also significant for sepsis due to skin source vs PNA, NSTEMI with shock (septic + cardiogenic), resp failure.   Brief patient description:  78 yo diabetic sent to [facility] ED with nausea, vomiting and altered mental status. Treated for DKA but course also significant for sepsis due to skin source vs PNA, NSTEMI with shock (septic + cardiogenic), resp failure.   Current Status: Some interaction on WUA Norepi down from 10 to 7 Note family meeting 8/8  Vital Signs: Temp:  [96.4 F (35.8 C)-99.1 F (37.3 C)] 99 F (37.2 C) (08/09 0800) Pulse Rate:  [64-98] 71  (08/09 0800) Resp:  [23-30] 27  (08/09 0800) BP: (90-138)/(40-81) 114/53 mmHg (08/09 0800) SpO2:  [88 %-100 %] 100 % (08/09 0800) FiO2 (%):  [60 %-70 %] 60 % (08/09 0827) Weight:  [73.6 kg (162 lb 4.1 oz)] 73.6 kg (162 lb 4.1 oz) (08/09 0248)    Intake/Output Summary (Last 24 hours) at 04/12/12 0942 Last data filed at 04/12/12 0900  Gross per 24 hour  Intake 4325.27 ml  Output   2875 ml  Net 1450.27 ml    Physical Examination: General:  Chronically ill appearing, orally intubated Neuro:  Sedated, minimal response to voice HEENT:  PERRL, dry membranes, # 8 ETT Neck:  Supple, no JVD Cardiovascular:  RRR, tachycardic Lungs:  Diffuse rales. Scattered rhonchi Abdomen:  Soft, non tender, bowel sounds present  Musculoskeletal:  Moves all extremities Skin:  R foot ulcer, sacral decubitus ulcer  Active Problems:  Hypertension  Diabetes mellitus  Hypothyroidism  Anemia  DKA (diabetic ketoacidoses)  Acute encephalopathy  Dehydration  Acute renal  failure  Diabetes with ulcer of foot  Sacral decubitus ulcer  Non-ST elevation myocardial infarction (NSTEMI), initial episode of care  Septic shock  Acute respiratory failure  ASSESSMENT AND PLAN  PULMONARY  Lab 04/11/12 0358 04/10/12 0438 04/09/12 0400 04/08/12 1040  PHART 7.427 7.445 7.383 7.392  PCO2ART 37.2 35.4 35.4 31.4*  PO2ART 77.3* 56.5* 54.7* 56.0*  HCO3 24.6* 24.2* 20.6 18.7*  O2SAT 96.9 91.8 87.5 89.2   Ventilator Settings: Vent Mode:  [-] PRVC FiO2 (%):  [60 %-70 %] 60 % Set Rate:  [26 bmp] 26 bmp Vt Set:  [470 mL] 470 mL PEEP:  [10 cmH20] 10 cmH20 Plateau Pressure:  [19 cmH20-33 cmH20] 33 cmH20 CXR: diffuse bilateral pulmonary infiltrates. Aeration unchanged ETT:  8/2>>>8/4 ETT:  8/5>>>  A:  Acute respiratory failure.  Severe acidosis, Septic shock skin source. Evolved progressive bilateral Pulmonary infiltrates c/w ARDS P:   Diurese as tolerated per FACTT goals, attempt to reinitiate diuresis 8/9. Will be limited by hypotension/pressor needs - change SBP goal to > 90 ARDS protocol Sedation protocol >> changed to continuous 8/7.   CARDIOVASCULAR  Lab 04/11/12 0420 04/09/12 0930 04/06/12 1215 04/05/12 1400  TROPONINI -- -- 4.09* 13.50*  LATICACIDVEN -- 1.5 -- --  PROBNP 3477.0* -- -- --   ECG:  None Lines: CVL  L [place] 8/2>>  A:  Septic shock with NSTEMI and superimposed cardiogenic shock.  P:  Cards recommends to treat CAD medically.  Recheck troponin 8/10 given drop BP last 48 hours ASA. norepi restarted 8/7 Broad abx, see ID section  RENAL  Lab 04/12/12 0400 04/11/12 0420 04/10/12 0325 04/09/12 0410 04/08/12 0500  NA 135 136 132* 133* 132*  K 3.9 3.5 -- -- --  CL 99 101 99 103 101  CO2 28 26 25 22 22   BUN 29* 24* 20 14 13   CREATININE 0.84 0.74 0.81 0.73 0.80  CALCIUM 7.7* 7.7* 7.7* 6.8* 7.2*  MG 1.8 -- -- -- 1.2*  PHOS 2.9 -- -- -- 3.1   Intake/Output      08/08 0701 - 08/09 0700 08/09 0701 - 08/10 0700   I.V. (mL/kg) 2777.3 (37.7) 118.5  (1.6)   NG/GT 1745 90   IV Piggyback 160    Total Intake(mL/kg) 4682.3 (63.6) 208.5 (2.8)   Urine (mL/kg/hr) 2875 (1.6)    Total Output 2875    Net +1807.3 +208.5         Foley:  8/1 >>> A:  Mild hyponatremia. Renal fxn holding.  P:   Aiming for negative fluid balance, have adjusted as BP dropped - Close obs of chemistry and K replacement   GASTROINTESTINAL  Lab 04/11/12 0420 04/10/12 0325 04/07/12 0500  AST 22 15 21   ALT 9 8 7   ALKPHOS 103 102 79  BILITOT 0.4 0.5 0.7  PROT 5.7* 5.7* 5.5*  ALBUMIN 1.5* 1.6* 1.6*   A: ileus >> no BM reported since 8/4, has received miralax, bowel regimen since 8/7 P: KUB now, consider CT scan abdomen Continue bowel regimen  A: Protein cal malnutrition P:   Tube feeds.    HEMATOLOGIC  Lab 04/12/12 0400 04/11/12 0420 04/10/12 0325 04/09/12 0410 04/08/12 0500 04/05/12 0945  HGB 7.6* 7.9* 8.7* 8.5* 9.9* --  HCT 22.0* 23.1* 25.5* 24.5* 29.0* --  PLT 156 196 201 186 224 --  INR -- -- -- -- -- 1.15  APTT -- -- -- -- -- 34   A:  Anemia.  No overt hemorrhage.  P:  Trend CBC 1u PRBC 8/9 for goal hgb > 8.0 with septic shock, NSTEMI this admission Cont PAS  INFECTIOUS  Lab 04/12/12 0400 04/11/12 0420 04/10/12 0325 04/09/12 0410 04/08/12 0500  WBC 16.6* 10.4 6.7 6.2 12.1*  PROCALCITON 1.29 1.40 2.01 -- --   Cultures: 8/1 Blood >>> negative 8/1 MRSA screen: negative   Antibiotics: Vancomycin 8/1 (sepsis , skin source)>>> 8/8 Zosyn 8/1 (sepsis, skin source)>>>   A:  Possible infected diabetic ulcer / sacral decub ulcer. P:   Antibiotics / cultures as above. Will stop vanc on 8/8 (completing 7 day rx, no orgs found); low threshold to restart if fever or decompensation  ENDOCRINE  Lab 04/12/12 0808 04/12/12 0412 04/11/12 2357 04/11/12 1944 04/11/12 1605  GLUCAP 130* 152* 219* 126* 97   A:  DM.  DKA secondary to noncompliance (history of noncompliance).  Hypothyroidism. Glucose well controlled now.  P:   Synthroid IV,  convert when able to take PO Lantus, CBG at goal Diabetes coordinator assisting  NEUROLOGIC Head CT:  8/1 >>> nad  A:  Acute encephalopathy, metabolic and due to sepsis P:   Monitor/supportive care.   GLOBAL/GOALS OF CARE: Spoke with patient's daughter [name] by phone 8/8, then w extended family later 8/8 - involved family includes pt's wife, other children, some nieces/nephews, one granddaughter. Explained his current status, slow decline over last several says, my concerns regarding prognosis for meaningful recovery. They understand and voice that patient and family would  not want care that did not offer chance for good quality life. They are comfortable with no CPR, defib if acute arrest. Will continue all other support in hope that there is improvement, keep family updated and plan to meet next week regarding whether we continue this level of support vs transition to comfort care.    BEST PRACTICE / DISPOSITION Level of Care:  ICU Primary Service:  PCCM Consultants:  None Code Status:  Limited >> no CPR/defib Diet: TF's DVT Px:  Heparin  Drip-->SCDs GI Px:  PPI Skin Integrity:  As above - per  wound care consult Social / Family: spoke with entire family 8/8   Levy Pupa, MD, PhD 04/12/2012, 9:40 AM [facility] [phone] or if no answer [phone]

## 2012-04-12 NOTE — Clinical Documentation Improvement (Signed)
    POA DOCUMENTATION CLARIFICATION QUERY  THIS DOCUMENT IS NOT A PERMANENT PART OF THE MEDICAL RECORD  TO RESPOND TO THE THIS QUERY, FOLLOW THE INSTRUCTIONS BELOW:  1. If needed, update documentation for the patient's encounter via the notes activity.  2. Access this query again and click edit on the In [employer].  3. After updating, or not, click F2 to complete all highlighted (required) fields concerning your review. Select "additional documentation in the medical record" OR "no additional documentation provided".  4. Click Sign note button.  5. The deficiency will fall out of your In Basket *Please let us know if you are not able to complete this workflow by phone or e-mail (listed below).  Please update your documentation within the medical record to reflect your response to this query.                                                                                    04/12/12  Dear Dr. Kathleene Hazel  In a better effort to capture your patient's severity of illness, reflect appropriate length of stay and utilization of resources, a review of the patient medical record has revealed the following indicators.    Based on your clinical judgment, please clarify and document in a progress note and/or discharge summary the clinical condition associated with the following supporting information:  In responding to this query please exercise your independent judgment.  The fact that a query is asked, does not imply that any particular answer is desired or expected.  Medicare rules require specification as to whether an inpatient diagnosis was present at the time of admission.  Please clarify if the following diagnosis,   Pt admitted with DKA/Encephalopathy    PN on 04/06/12 on LOS day 3 states pt with NSTEMI initial episode of care  Please clarify (see below) the following and document in pn or d/c summary   1. NSTEMI (POA) present on admission  2. NSTEMI not (POA) present on  admission   3.Unable to clinically determine whether the condition was present on admission.   4. Documentation insufficient to determine if condition was present at the time of inpatient admission    You may use possible, probable, or suspect with inpatient documentation. possible, probable, suspected diagnoses MUST be documented at the time of discharge  Reviewed: additional documentation in the medical record     You may use possible, probable, or suspect with inpatient documentation. possible, probable, suspected diagnoses MUST be documented at the time of discharge  Reviewed: additional documentation in the medical record      Thank You,  Enis Slipper  RN, BSN, MSN/Inf, CCDS Clinical Documentation Specialist [facility] Dept Pager: [phone] / E-mail: [email or web address]  Health Information Management [place]

## 2012-04-12 NOTE — Progress Notes (Signed)
CARE MANAGE MENT UTILIZATION REVIEW NOTE 04/12/2012     Patient:  [name],[name]   Account Number:  [id number]  Documented by:  Bjorn Loser Rody Keadle   Per Ur Regulation Reviewed for med. necessity/level of care/duration of stay

## 2012-04-13 ENCOUNTER — Inpatient Hospital Stay (HOSPITAL_COMMUNITY): Payer: Medicare Other

## 2012-04-13 LAB — CBC
Hemoglobin: 9.3 g/dL — ABNORMAL LOW (ref 13.0–17.0)
MCHC: 34.6 g/dL (ref 30.0–36.0)
RBC: 2.91 MIL/uL — ABNORMAL LOW (ref 4.22–5.81)

## 2012-04-13 LAB — CARDIAC PANEL(CRET KIN+CKTOT+MB+TROPI)
CK, MB: 1.4 ng/mL (ref 0.3–4.0)
Relative Index: INVALID (ref 0.0–2.5)
Total CK: 13 U/L (ref 7–232)

## 2012-04-13 LAB — BASIC METABOLIC PANEL
GFR calc Af Amer: >90 mL/min (ref >90)
GFR calc non Af Amer: 78 mL/min — ABNORMAL LOW (ref >90)
Potassium: 3.6 mEq/L (ref 3.5–5.1)
Sodium: 137 mEq/L (ref 135–145)

## 2012-04-13 LAB — GLUCOSE, CAPILLARY
Glucose-Capillary: 113 mg/dL — ABNORMAL HIGH (ref 70–99)
Glucose-Capillary: 119 mg/dL — ABNORMAL HIGH (ref 70–99)
Glucose-Capillary: 130 mg/dL — ABNORMAL HIGH (ref 70–99)
Glucose-Capillary: 131 mg/dL — ABNORMAL HIGH (ref 70–99)
Glucose-Capillary: 149 mg/dL — ABNORMAL HIGH (ref 70–99)
Glucose-Capillary: 155 mg/dL — ABNORMAL HIGH (ref 70–99)

## 2012-04-13 LAB — TYPE AND SCREEN

## 2012-04-13 LAB — PROCALCITONIN: Procalcitonin: 0.9 ng/mL

## 2012-04-13 MED ORDER — IPRATROPIUM BROMIDE 0.02 % IN SOLN
RESPIRATORY_TRACT | Status: AC
  Filled 2012-04-13: qty 2.5

## 2012-04-13 MED ORDER — ALBUTEROL SULFATE (5 MG/ML) 0.5% IN NEBU
INHALATION_SOLUTION | RESPIRATORY_TRACT | Status: AC
  Filled 2012-04-13: qty 0.5

## 2012-04-13 MED ORDER — FENTANYL CITRATE 0.05 MG/ML IJ SOLN
25.0000 ug | INTRAMUSCULAR | Status: DC | PRN
  Administered 2012-04-14: 25 ug via INTRAVENOUS
  Filled 2012-04-13 (×2): qty 2

## 2012-04-13 MED ORDER — IPRATROPIUM BROMIDE 0.02 % IN SOLN
0.5000 mg | Freq: Four times a day (QID) | RESPIRATORY_TRACT | Status: DC
  Administered 2012-04-13 – 2012-04-16 (×13): 0.5 mg via RESPIRATORY_TRACT
  Filled 2012-04-13 (×12): qty 2.5

## 2012-04-13 MED ORDER — MIDAZOLAM HCL 2 MG/2ML IJ SOLN: 1.0000 mg | INTRAMUSCULAR | Status: DC | PRN

## 2012-04-13 MED ORDER — ALBUTEROL SULFATE (5 MG/ML) 0.5% IN NEBU
2.5000 mg | INHALATION_SOLUTION | Freq: Four times a day (QID) | RESPIRATORY_TRACT | Status: DC
  Administered 2012-04-13 – 2012-04-16 (×13): 2.5 mg via RESPIRATORY_TRACT
  Filled 2012-04-13 (×12): qty 0.5

## 2012-04-13 NOTE — Progress Notes (Signed)
eLink Physician-Brief Progress Note Patient Name: [name] DOB: [birth year] MRN: [id number]  Date of Service  04/13/2012   HPI/Events of Note     eICU Interventions  Dc cont sedation Use intermittent protocol   Intervention Category Intermediate Interventions: Other:  ALVA,RAKESH V. 04/13/2012, 5:58 PM

## 2012-04-13 NOTE — Progress Notes (Signed)
ANTIBIOTIC CONSULT NOTE - FOLLOW UP  Pharmacy Consult for Zosyn Indication: Sepsis, skin source vs PNA  No Known Allergies  Patient Measurements: Height: 6' (182.9 cm) Weight: 166 lb 3.6 oz (75.4 kg) IBW/kg (Calculated) : 77.6   Vital Signs: Temp: 96.1 F (35.6 C) (08/10 0900) Temp src: Core (Comment) (08/10 0900) BP: 114/52 mmHg (08/10 0900) Pulse Rate: 79  (08/10 0900) Intake/Output from previous day: 08/09 0701 - 08/10 0700 In: 2574.1 [I.V.:939.9; Blood:629.2; NG/GT:855; IV Piggyback:150] Out: 3275 [Urine:3275] Intake/Output from this shift: Total I/O In: 85 [I.V.:40; NG/GT:45] Out: 210 [Urine:210]  Labs:  [facility] 04/13/12 0525 04/12/12 0400 04/11/12 0420  WBC 20.5* 16.6* 10.4  HGB 9.3* 7.6* 7.9*  PLT 134* 156 196  LABCREA -- -- --  CREATININE 0.89 0.84 0.74   Estimated Creatinine Clearance: 69.4 ml/min (by C-G formula based on Cr of 0.89).  82 ml/mim/1.1m2 (normalized)   Microbiology: Recent Results (from the past 720 hour(s))  URINE CULTURE     Status: Normal   Collection Time   04/04/12  7:57 PM      Component Value Range Status Comment   Specimen Description URINE, CLEAN CATCH   Final    Special Requests NONE   Final    Culture  Setup Time 04/05/2012 01:54   Final    Colony Count NO GROWTH   Final    Culture NO GROWTH   Final    Report Status 04/05/2012 FINAL   Final   CULTURE, BLOOD (ROUTINE X 2)     Status: Normal   Collection Time   04/04/12  8:20 PM      Component Value Range Status Comment   Specimen Description BLOOD LEFT HAND   Final    Special Requests BOTTLES DRAWN AEROBIC ONLY 3CC   Final    Culture  Setup Time 04/05/2012 02:15   Final    Culture NO GROWTH 5 DAYS   Final    Report Status 04/11/2012 FINAL   Final   CULTURE, BLOOD (ROUTINE X 2)     Status: Normal   Collection Time   04/04/12  8:48 PM      Component Value Range Status Comment   Specimen Description BLOOD RIGHT ARM   Final    Special Requests BOTTLES DRAWN AEROBIC ONLY 3CC    Final    Culture  Setup Time 04/05/2012 02:15   Final    Culture NO GROWTH 5 DAYS   Final    Report Status 04/11/2012 FINAL   Final   MRSA PCR SCREENING     Status: Normal   Collection Time   04/04/12 11:28 PM      Component Value Range Status Comment   MRSA by PCR NEGATIVE  NEGATIVE Final   RAPID STREP SCREEN     Status: Normal   Collection Time   04/05/12 12:08 AM      Component Value Range Status Comment   Streptococcus, Group A Screen (Direct) NEGATIVE  NEGATIVE Final     Anti-infectives     Start     Dose/Rate Route Frequency Ordered Stop   04/06/12 1200   vancomycin (VANCOCIN) IVPB 1000 mg/200 mL premix  Status:  Discontinued        1,000 mg 200 mL/hr over 60 Minutes Intravenous Every 12 hours 04/06/12 0717 04/11/12 1045   04/04/12 2300   vancomycin (VANCOCIN) IVPB 1000 mg/200 mL premix  Status:  Discontinued        1,000 mg 200 mL/hr over 60 Minutes  Intravenous Every 24 hours 04/04/12 2231 04/06/12 0717   04/04/12 2300   piperacillin-tazobactam (ZOSYN) IVPB 3.375 g        3.375 g 12.5 mL/hr over 240 Minutes Intravenous Every 8 hours 04/04/12 2231            Assessment:  78yo M with diabetes, admitted with DKA.  On day #9 Zosyn for sepsis, likely source is multiple decubiti. (Vancomycin 8/2-8/8)  Afebrile (hypothermic), WBC improved to wnl 8/8 but now elevated again  SCr wnl/stable, CrCl 27ml/min.  All cultures negative to date.  Goal of Therapy:  Appropriate dose of Zosyn based on renal fx  Plan:   Cont Zosyn 3.375g IV Q8H infused over 4hrs. Follow up renal function & cultures; adjust as appropriate  Gwen Her PharmD  [phone] 04/13/2012 11:06 AM

## 2012-04-13 NOTE — Progress Notes (Signed)
Requires frequent oral and ETT suctioning to maintain O2 sats greater than 90% on FIO2 of 50%.  Oral secretions are clear.  ETT secretions are scant, thick and yellow.  Patient follows commands and communicates needs.  Will continue to monitor.

## 2012-04-13 NOTE — Progress Notes (Signed)
Name: [name] MRN: [id number] DOB: 1928-11-22    LOS: 9  Referring Provider:  EDP Reason for Referral:  DKA  PULMONARY / CRITICAL CARE MEDICINE    Brief patient description:  78 yo diabetic admit from home via [facility] ED with nausea, vomiting and altered mental status. Treated for DKA but course also significant for sepsis due to skin source vs PNA, NSTEMI with shock (septic + cardiogenic), resp failure.   Current Status: Some interaction on WUA, still on pressors, air trapping badly on ards protocol   Vital Signs: Temp:  [95.5 F (35.3 C)-98.8 F (37.1 C)] 97.3 F (36.3 C) (08/10 0800) Pulse Rate:  [65-99] 68  (08/10 0700) Resp:  [17-35] 22  (08/10 0800) BP: (70-137)/(39-60) 98/42 mmHg (08/10 0800) SpO2:  [88 %-100 %] 93 % (08/10 0800) FiO2 (%):  [50 %-60 %] 50 % (08/10 0840) Weight:  [166 lb 3.6 oz (75.4 kg)] 166 lb 3.6 oz (75.4 kg) (08/10 0000)    Intake/Output Summary (Last 24 hours) at 04/13/12 0847 Last data filed at 04/13/12 0700  Gross per 24 hour  Intake 2461.77 ml  Output   3275 ml  Net -813.23 ml    Physical Examination: General:  Chronically ill appearing, orally intubated Neuro:  Following some commands HEENT:  PERRL, dry membranes, # 8 ETT Neck:  Supple, no JVD Cardiovascular:  RRR, tachycardic Lungs:  Diffuse wheezing Abdomen:  Soft, non tender, bowel sounds present  Musculoskeletal:  Moves all extremities Skin:  R foot ulcer, sacral decubitus ulcer  Active Problems:  Hypertension  Diabetes mellitus  Hypothyroidism  Anemia  DKA (diabetic ketoacidoses)  Acute encephalopathy  Dehydration  Acute renal failure  Diabetes with ulcer of foot  Sacral decubitus ulcer  Non-ST elevation myocardial infarction (NSTEMI), initial episode of care  Septic shock  Acute respiratory failure  ASSESSMENT AND PLAN  PULMONARY  Lab 04/11/12 0358 04/10/12 0438 04/09/12 0400 04/08/12 1040  PHART 7.427 7.445 7.383 7.392  PCO2ART 37.2 35.4 35.4 31.4*  PO2ART  77.3* 56.5* 54.7* 56.0*  HCO3 24.6* 24.2* 20.6 18.7*  O2SAT 96.9 91.8 87.5 89.2   Ventilator Settings: Vent Mode:  [-] PRVC FiO2 (%):  [50 %-60 %] 50 % Set Rate:  [26 bmp] 26 bmp Vt Set:  [470 mL] 470 mL PEEP:  [8 cmH20-10 cmH20] 10 cmH20 Plateau Pressure:  [20 cmH20-28 cmH20] 21 cmH20 CXR: 8/10 diffuse bilateral pulmonary infiltrates. Aeration unchanged et at [place], pulled back 4cm ETT:  8/2>>>8/4 ETT:  8/5>>>  A:  Acute respiratory failure.  Severe acidosis, Septic shock skin source. Evolved progressive bilateral Pulmonary infiltrates c/w ARDS P:   Diurese as tolerated per FACTT goals,  ARDS protocol stopped 8/10 with severe air trapping Sedation prn, should be easier to sedate once the air trapping is corrected  CARDIOVASCULAR  Lab 04/13/12 0525 04/11/12 0420 04/09/12 0930 04/06/12 1215  TROPONINI <0.30 -- -- 4.09*  LATICACIDVEN -- -- 1.5 --  PROBNP -- 3477.0* -- --     Lines: CVL  L [place] 8/2>>  A:  Septic shock with NSTEMI and superimposed cardiogenic shock.  P:  Cards recommends to treat CAD medically. Recheck troponin 8/10 given drop BP last 48 hours ASA. norepi restarted 8/7 Broad abx, see ID section Follow cvp's once get autopeep corrected  RENAL  Lab 04/13/12 0525 04/12/12 0400 04/11/12 0420 04/10/12 0325 04/09/12 0410 04/08/12 0500  NA 137 135 136 132* 133* --  K 3.6 3.9 -- -- -- --  CL 99 99 101 99  103 --  CO2 29 28 26 25 22  --  BUN 32* 29* 24* 20 14 --  CREATININE 0.89 0.84 0.74 0.81 0.73 --  CALCIUM 8.1* 7.7* 7.7* 7.7* 6.8* --  MG -- 1.8 -- -- -- 1.2*  PHOS -- 2.9 -- -- -- 3.1   Intake/Output      08/09 0701 - 08/10 0700 08/10 0701 - 08/11 0700   I.V. (mL/kg) 936.1 (12.4)    Blood 629.2    NG/GT 855    IV Piggyback 150    Total Intake(mL/kg) 2570.3 (34.1)    Urine (mL/kg/hr) 3275 (1.8)    Total Output 3275    Net -704.7          Foley:  8/1 >>> A:  Mild hyponatremia resolved        GASTROINTESTINAL  Lab 04/11/12 0420 04/10/12 0325  04/07/12 0500  AST 22 15 21   ALT 9 8 7   ALKPHOS 103 102 79  BILITOT 0.4 0.5 0.7  PROT 5.7* 5.7* 5.5*  ALBUMIN 1.5* 1.6* 1.6*   A: ileus >> no BM reported since 8/4, has received miralax, bowel regimen since 8/7 P:  Continue bowel regimen  A: Protein cal malnutrition P:   Tube feeds.    HEMATOLOGIC  Lab 04/13/12 0525 04/12/12 0400 04/11/12 0420 04/10/12 0325 04/09/12 0410  HGB 9.3* 7.6* 7.9* 8.7* 8.5*  HCT 26.9* 22.0* 23.1* 25.5* 24.5*  PLT 134* 156 196 201 186  INR -- -- -- -- --  APTT -- -- -- -- --   A:  Anemia.  No overt hemorrhage.  P:  Trend CBC 1u PRBC 8/9 for goal hgb  With good response up to 9.3 adequate 8/10 Cont PAS   INFECTIOUS  Lab 04/13/12 0525 04/12/12 0400 04/11/12 0420 04/10/12 0325 04/09/12 0410  WBC 20.5* 16.6* 10.4 6.7 6.2  PROCALCITON 0.90 1.29 1.40 2.01 --   Cultures: 8/1 Blood >>> negative 8/1 MRSA screen: negative   Antibiotics: Vancomycin 8/1 (sepsis , skin source)>>> 8/8 Zosyn 8/1 (sepsis, skin source)>>>   A:  Possible infected diabetic ulcer / sacral decub ulcer. P:   Antibiotics / cultures as above. dc vanc on 8/8 (completing 7 day rx, no orgs found); low threshold to restart if fever or decompensation  ENDOCRINE  Lab 04/13/12 0819 04/13/12 0328 04/12/12 2344 04/12/12 2005 04/12/12 1649  GLUCAP 130* 113* 119* 91 100*   A:  DM.  DKA secondary to noncompliance (history of noncompliance).  Hypothyroidism. Glucose well controlled now.  P:   Synthroid IV, convert when able to take PO Lantus, CBG at goal Diabetes coordinator assisting  NEUROLOGIC Head CT:  8/1 >>> nad  A:  Acute encephalopathy, metabolic and due to sepsis P:   Monitor/supportive care.   GLOBAL/GOALS OF CARE: Spoke with patient's daughter [name] by phone 8/8, then w extended family later 8/8 - involved family includes pt's wife, other children, some nieces/nephews, one granddaughter. Explained his current status, slow decline over last several says, my  concerns regarding prognosis for meaningful recovery. They understand and voice that patient and family would not want care that did not offer chance for good quality life. They are comfortable with no CPR, defib if acute arrest. Will continue all other support in hope that there is improvement, keep family updated and plan to meet next week regarding whether we continue this level of support vs transition to comfort care.    BEST PRACTICE / DISPOSITION Level of Care:  ICU Primary Service:  PCCM Consultants:  None Code Status:  Limited >> no CPR/defib Diet: TF's DVT Px:  Heparin  Drip-->SCDs due to anemia GI Px:  PPI Skin Integrity:  As above - per  wound care consult Social / Family: spoke with entire family 8/8  The patient is critically ill with multiple organ systems failure and requires high complexity decision making for assessment and support, frequent evaluation and titration of therapies, application of advanced monitoring technologies and extensive interpretation of multiple databases. Critical Care Time devoted to patient care services described in this note is 45 minutes.  Sandrea Hughs, MD Pulmonary and Critical Care Medicine [facility] Cell [phone]

## 2012-04-13 NOTE — Progress Notes (Signed)
Patient desat to 80-83% recruitment maneuver done, patient tolerated well remained stable throughout. Sat increased to 86%. RT increased peep to 10. Sat increased to 90%, RT will continue to monitor. RN aware.

## 2012-04-14 DIAGNOSIS — E1169 Type 2 diabetes mellitus with other specified complication: Secondary | ICD-10-CM

## 2012-04-14 LAB — GLUCOSE, CAPILLARY
Glucose-Capillary: 125 mg/dL — ABNORMAL HIGH (ref 70–99)
Glucose-Capillary: 152 mg/dL — ABNORMAL HIGH (ref 70–99)
Glucose-Capillary: 138 mg/dL — ABNORMAL HIGH (ref 70–99)
Glucose-Capillary: 137 mg/dL — ABNORMAL HIGH (ref 70–99)
Glucose-Capillary: 148 mg/dL — ABNORMAL HIGH (ref 70–99)
Glucose-Capillary: 175 mg/dL — ABNORMAL HIGH (ref 70–99)

## 2012-04-14 MED ORDER — FUROSEMIDE 10 MG/ML IJ SOLN
40.0000 mg | Freq: Once | INTRAMUSCULAR | Status: AC
  Administered 2012-04-14: 40 mg via INTRAVENOUS
  Filled 2012-04-14: qty 4

## 2012-04-14 NOTE — Progress Notes (Signed)
Name: [name] MRN: [id number] DOB: Jan 23, 1929    LOS: 10  Referring Provider:  EDP Reason for Referral:  DKA  PULMONARY / CRITICAL CARE MEDICINE    Brief patient description:  78 yo diabetic admit from home via [facility] ED with nausea, vomiting and altered mental status. Treated for DKA but course also significant for sepsis due to skin source vs PNA, NSTEMI with shock (septic + cardiogenic), resp failure.   Current Status:  air trapping improved off ards protocol > allowed reduced sedation and much improved mental status   Vital Signs: Temp:  [97.4 F (36.3 C)-97.6 F (36.4 C)] 97.6 F (36.4 C) (08/11 0700) Pulse Rate:  [34-95] 88  (08/11 1000) Resp:  [0-32] 29  (08/11 1000) BP: (96-129)/(40-71) 117/71 mmHg (08/11 1000) SpO2:  [88 %-99 %] 92 % (08/11 1000) FiO2 (%):  [50 %-60 %] 50 % (08/11 1000) CVP:  [9 mmHg] 9 mmHg  Intake/Output Summary (Last 24 hours) at 04/14/12 1037 Last data filed at 04/14/12 0924  Gross per 24 hour  Intake 1842.5 ml  Output   1260 ml  Net  582.5 ml    Physical Examination: General:  Chronically ill appearing, orally intubated Neuro:  Following   Commands/ alert and interactive HEENT:  PERRL, dry membranes, # 8 ETT Neck:  Supple, no JVD Cardiovascular:  RRR, tachycardic Lungs:  Diffuse wheezing Abdomen:  Soft, non tender, bowel sounds present  Musculoskeletal:  Moves all extremities Skin:  R foot ulcer, sacral decubitus ulcer  Active Problems:  Hypertension  Diabetes mellitus  Hypothyroidism  Anemia  DKA (diabetic ketoacidoses)  Acute encephalopathy  Dehydration  Acute renal failure  Diabetes with ulcer of foot  Sacral decubitus ulcer  Non-ST elevation myocardial infarction (NSTEMI), initial episode of care  Septic shock  Acute respiratory failure  ASSESSMENT AND PLAN  PULMONARY  Lab 04/11/12 0358 04/10/12 0438 04/09/12 0400 04/08/12 1040  PHART 7.427 7.445 7.383 7.392  PCO2ART 37.2 35.4 35.4 31.4*  PO2ART 77.3* 56.5*  54.7* 56.0*  HCO3 24.6* 24.2* 20.6 18.7*  O2SAT 96.9 91.8 87.5 89.2   Ventilator Settings: Vent Mode:  [-] PRVC FiO2 (%):  [50 %-60 %] 50 % Set Rate:  [16 bmp] 16 bmp Vt Set:  [600 mL] 600 mL PEEP:  [8 cmH20-10 cmH20] 8 cmH20 Plateau Pressure:  [25 cmH20-30 cmH20] 26 cmH20 CXR: 8/10 diffuse bilateral pulmonary infiltrates. Aeration unchanged et at carina, pulled back 4cm since am cxr ETT:  8/2>>>8/4 ETT:  8/5>>>  A:  Acute respiratory failure.  Severe acidosis, Septic shock skin source. Evolved progressive bilateral Pulmonary infiltrates c/w ARDS P:   Diurese as tolerated per FACTT goals,  ARDS protocol stopped 8/10 with severe air trapping Sedation prn,  easier to sedate p air trapping is corrected  CARDIOVASCULAR  Lab 04/13/12 0525 04/11/12 0420 04/09/12 0930  TROPONINI <0.30 -- --  LATICACIDVEN -- -- 1.5  PROBNP -- 3477.0* --     Lines: CVL  L [place] 8/2>>  A:  Septic shock with NSTEMI and superimposed cardiogenic shock.  P:  Cards recommends to treat CAD medically. Recheck troponin 8/10 given drop BP last 48 hours ASA. abx, see ID section cvp's suggest he's on the dry side  RENAL  Lab 04/13/12 0525 04/12/12 0400 04/11/12 0420 04/10/12 0325 04/09/12 0410 04/08/12 0500  NA 137 135 136 132* 133* --  K 3.6 3.9 -- -- -- --  CL 99 99 101 99 103 --  CO2 29 28 26 25 22  --  BUN 32* 29* 24* 20 14 --  CREATININE 0.89 0.84 0.74 0.81 0.73 --  CALCIUM 8.1* 7.7* 7.7* 7.7* 6.8* --  MG -- 1.8 -- -- -- 1.2*  PHOS -- 2.9 -- -- -- 3.1   Intake/Output      08/10 0701 - 08/11 0700 08/11 0701 - 08/12 0700   I.V. (mL/kg) 620 (8.2) 40 (0.5)   Blood     NG/GT 1080 135   IV Piggyback 162.5 12.5   Total Intake(mL/kg) 1862.5 (24.7) 187.5 (2.5)   Urine (mL/kg/hr) 1400 (0.8) 70   Total Output 1400 70   Net +462.5 +117.5        Stool Occurrence 3 x 1 x    Foley:  8/1 >>> A:  Mild hyponatremia resolved        GASTROINTESTINAL  Lab 04/11/12 0420 04/10/12 0325  AST 22 15  ALT 9  8  ALKPHOS 103 102  BILITOT 0.4 0.5  PROT 5.7* 5.7*  ALBUMIN 1.5* 1.6*   A: ileus >> improved P:  Continue bowel regimen   A: Protein cal malnutrition P:   Tube feeds.    HEMATOLOGIC  Lab 04/13/12 0525 04/12/12 0400 04/11/12 0420 04/10/12 0325 04/09/12 0410  HGB 9.3* 7.6* 7.9* 8.7* 8.5*  HCT 26.9* 22.0* 23.1* 25.5* 24.5*  PLT 134* 156 196 201 186  INR -- -- -- -- --  APTT -- -- -- -- --   A:  Anemia.  No overt hemorrhage.  P:  Trend CBC 1u PRBC 8/9  With good response up to 9.3  8/10 so at goal Cont PAS   INFECTIOUS  Lab 04/13/12 0525 04/12/12 0400 04/11/12 0420 04/10/12 0325 04/09/12 0410  WBC 20.5* 16.6* 10.4 6.7 6.2  PROCALCITON 0.90 1.29 1.40 2.01 --   Cultures: 8/1 Blood >>> negative 8/1 MRSA screen: negative   Antibiotics: Vancomycin 8/1 (sepsis , skin source)>>> 8/8 Zosyn 8/1 (sepsis, skin source)>>>   A:  Possible infected diabetic ulcer / sacral decub ulcer. P:   Antibiotics / cultures as above. dc vanc on 8/8 (completing 7 day rx, no orgs found); low threshold to restart if fever or decompensation  ENDOCRINE  Lab 04/14/12 0405 04/13/12 2352 04/13/12 2040 04/13/12 1534 04/13/12 1157  GLUCAP 152* 125* 131* 149* 155*   A:  DM.  DKA secondary to noncompliance (history of noncompliance).  Hypothyroidism. Glucose well controlled now.  P:   Synthroid IV, convert when able to take PO Lantus, CBG at goal Diabetes coordinator assisting  NEUROLOGIC Head CT:  8/1 >>> nad  A:  Acute encephalopathy, metabolic and due to sepsis   Much improved 8/11 p change to prn sedation  GLOBAL/GOALS OF CARE: Spoke with patient's daughter [name] by phone 8/8, then w extended family later 8/8 - involved family includes pt's wife, other children, some nieces/nephews, one granddaughter. Explained his current status, slow decline over last several says, my concerns regarding prognosis for meaningful recovery. They understand and voice that patient and family would not  want care that did not offer chance for good quality life. They are comfortable with no CPR, defib if acute arrest. Will continue all other support in hope that there is improvement, keep family updated and plan to meet next week regarding whether we continue this level of support vs transition to comfort care.    BEST PRACTICE / DISPOSITION Level of Care:  ICU Primary Service:  PCCM Consultants:  None Code Status:  Limited >> no CPR/defib Diet: TF's DVT Px:  Heparin  Drip-->SCDs due to anemia GI Px:  PPI Skin Integrity:  As above - per  wound care consult Social / Family: spoke with entire family 8/8     Sandrea Hughs, MD Pulmonary and Critical Care Medicine [facility] Cell [phone]

## 2012-04-14 NOTE — Progress Notes (Signed)
Now having frequent bowel movement.  Passed a large amount of hard stool 04-13-12.  Stools are now liquid.

## 2012-04-15 ENCOUNTER — Inpatient Hospital Stay (HOSPITAL_COMMUNITY): Payer: Medicare Other

## 2012-04-15 LAB — BASIC METABOLIC PANEL
GFR calc Af Amer: >90 mL/min (ref >90)
GFR calc non Af Amer: 78 mL/min — ABNORMAL LOW (ref >90)
Glucose, Bld: 147 mg/dL — ABNORMAL HIGH (ref 70–99)
Potassium: 4.1 mEq/L (ref 3.5–5.1)
Sodium: 141 mEq/L (ref 135–145)

## 2012-04-15 LAB — GLUCOSE, CAPILLARY
Glucose-Capillary: 126 mg/dL — ABNORMAL HIGH (ref 70–99)
Glucose-Capillary: 148 mg/dL — ABNORMAL HIGH (ref 70–99)
Glucose-Capillary: 180 mg/dL — ABNORMAL HIGH (ref 70–99)

## 2012-04-15 LAB — PROCALCITONIN: Procalcitonin: 0.73 ng/mL

## 2012-04-15 LAB — CBC
Hemoglobin: 8.2 g/dL — ABNORMAL LOW (ref 13.0–17.0)
MCH: 31.4 pg (ref 26.0–34.0)
RBC: 2.61 MIL/uL — ABNORMAL LOW (ref 4.22–5.81)

## 2012-04-15 MED ORDER — FUROSEMIDE 10 MG/ML IJ SOLN
40.0000 mg | Freq: Once | INTRAMUSCULAR | Status: AC
  Administered 2012-04-15: 40 mg via INTRAVENOUS
  Filled 2012-04-15: qty 4

## 2012-04-15 MED ORDER — POTASSIUM CHLORIDE 20 MEQ/15ML (10%) PO LIQD
40.0000 meq | Freq: Once | ORAL | Status: AC
  Administered 2012-04-15: 40 meq

## 2012-04-15 NOTE — Progress Notes (Signed)
CARE MANAGE MENT UTILIZATION REVIEW NOTE 04/15/2012     Patient:  Leavell,Quanell   Account Number:  [id number]  Documented by:  Bjorn Loser DAVIS   Per Ur Regulation Reviewed for med. necessity/level of care/duration of stay

## 2012-04-15 NOTE — Progress Notes (Signed)
Name: [name] MRN: [id number] DOB: Sep 03, 1929    LOS: 11  Referring Provider:  EDP Reason for Referral:  DKA  PULMONARY / CRITICAL CARE MEDICINE    Brief patient description:  78 yo diabetic admit from home via [facility] ED with nausea, vomiting and altered mental status. Treated for DKA but course also significant for sepsis due to skin source vs PNA, NSTEMI with shock (septic + cardiogenic), resp failure.   Current Status: No distress on vent   Vital Signs: Temp:  [97.5 F (36.4 C)-97.9 F (36.6 C)] 97.9 F (36.6 C) (08/12 0800) Pulse Rate:  [70-95] 79  (08/12 0910) Resp:  [19-31] 28  (08/12 0910) BP: (101-129)/(40-71) 106/40 mmHg (08/12 0910) SpO2:  [77 %-98 %] 90 % (08/12 0921) FiO2 (%):  [50 %-60 %] 60 % (08/12 0921) Weight:  [71.2 kg (156 lb 15.5 oz)] 71.2 kg (156 lb 15.5 oz) (08/12 0453) CVP:  [7 mmHg] 7 mmHg  Intake/Output Summary (Last 24 hours) at 04/15/12 0937 Last data filed at 04/15/12 0800  Gross per 24 hour  Intake 1487.5 ml  Output   2935 ml  Net -1447.5 ml    Physical Examination: General:  Chronically ill appearing, orally intubated Neuro:  Following   Commands/ alert and interactive HEENT:  PERRL, dry membranes, # 8 ETT Neck:  Supple, no JVD Cardiovascular:  RRR, tachycardic Lungs:  Scattered rhonchi Abdomen:  Soft, non tender, bowel sounds present  Musculoskeletal:  Moves all extremities Skin:  R foot ulcer, sacral decubitus ulcer  Active Problems:  Hypertension  Diabetes mellitus  Hypothyroidism  Anemia  DKA (diabetic ketoacidoses)  Acute encephalopathy  Dehydration  Acute renal failure  Diabetes with ulcer of foot  Sacral decubitus ulcer  Non-ST elevation myocardial infarction (NSTEMI), initial episode of care  Septic shock  Acute respiratory failure  ASSESSMENT AND PLAN  PULMONARY  Lab 04/11/12 0358 04/10/12 0438 04/09/12 0400 04/08/12 1040  PHART 7.427 7.445 7.383 7.392  PCO2ART 37.2 35.4 35.4 31.4*  PO2ART 77.3* 56.5*  54.7* 56.0*  HCO3 24.6* 24.2* 20.6 18.7*  O2SAT 96.9 91.8 87.5 89.2   Ventilator Settings: Vent Mode:  [-] PRVC FiO2 (%):  [50 %-60 %] 60 % Set Rate:  [16 bmp] 16 bmp Vt Set:  [600 mL] 600 mL PEEP:  [10 cmH20] 10 cmH20 Plateau Pressure:  [26 cmH20-32 cmH20] 26 cmH20 CXR: 8/12: improved bilateral airspace disease.  ETT:  8/2>>>8/4 ETT:  8/5>>>  A:  Acute respiratory failure. Septic shock skin source. Evolved progressive bilateral Pulmonary infiltrates c/w ARDS Aeration improved w/ diuresis  P:   Diurese as tolerated per FACTT goals,  ARDS protocol stopped 8/10 with severe air trapping Sedation prn  CARDIOVASCULAR  Lab 04/13/12 0525 04/11/12 0420 04/09/12 0930  TROPONINI <0.30 -- --  LATICACIDVEN -- -- 1.5  PROBNP -- 3477.0* --     Lines: CVL  L [place] 8/2>>  A:  Septic shock with NSTEMI and superimposed cardiogenic shock.  P:  Cards recommends to treat CAD medically. Recheck troponin 8/10 given drop BP last 48 hours ASA. abx, see ID section  RENAL  Lab 04/15/12 0546 04/13/12 0525 04/12/12 0400 04/11/12 0420 04/10/12 0325  NA 141 137 135 136 132*  K 4.1 3.6 -- -- --  CL 102 99 99 101 99  CO2 32 29 28 26 25   BUN 35* 32* 29* 24* 20  CREATININE 0.88 0.89 0.84 0.74 0.81  CALCIUM 8.3* 8.1* 7.7* 7.7* 7.7*  MG -- -- 1.8 -- --  PHOS -- -- 2.9 -- --   Intake/Output      08/11 0701 - 08/12 0700 08/12 0701 - 08/13 0700   I.V. (mL/kg) 220 (3.1) 20 (0.3)   NG/GT 1330 45   IV Piggyback 150    Total Intake(mL/kg) 1700 (23.9) 65 (0.9)   Urine (mL/kg/hr) 2855 (1.7) 150   Total Output 2855 150   Net -1155 -85        Stool Occurrence 10 x     Foley:  8/1 >>> A:  Mild hyponatremia resolved P:  Monitor chemistry w/ diuresis   GASTROINTESTINAL  Lab 04/11/12 0420 04/10/12 0325  AST 22 15  ALT 9 8  ALKPHOS 103 102  BILITOT 0.4 0.5  PROT 5.7* 5.7*  ALBUMIN 1.5* 1.6*   A: ileus >> improved, now having diarrhea  P:  adjust bowel regimen--->stop miralax  check C diff  (although this is more likely the mirilax-->feel obligated to check given leukocytosis)  A: Protein cal malnutrition P:   Tube feeds.    HEMATOLOGIC  Lab 04/15/12 0546 04/13/12 0525 04/12/12 0400 04/11/12 0420 04/10/12 0325  HGB 8.2* 9.3* 7.6* 7.9* 8.7*  HCT 24.4* 26.9* 22.0* 23.1* 25.5*  PLT 132* 134* 156 196 201  INR -- -- -- -- --  APTT -- -- -- -- --   A:  Anemia.  No overt hemorrhage. 1u PRBC 8/9  With good response up to 9.3  8/10   P:  Trend CBC Cont PAS    INFECTIOUS  Lab 04/15/12 0546 04/13/12 0525 04/12/12 0400 04/11/12 0420 04/10/12 0325  WBC 20.6* 20.5* 16.6* 10.4 6.7  PROCALCITON 0.73 0.90 1.29 1.40 2.01   Cultures: 8/1 Blood >>> negative 8/1 MRSA screen: negative   Antibiotics: Vancomycin 8/1 (sepsis , skin source)>>> 8/8 Zosyn 8/1 (sepsis, skin source)>>> 8/12  A:  Possible infected diabetic ulcer / sacral decub ulcer.       Leukocytosis: PCT normal  P:   Antibiotics / cultures as above. Will stop abx. Has had 11d rx w/ no orgs ever identified.  Check c diff antigen   ENDOCRINE  Lab 04/15/12 0750 04/15/12 0346 04/15/12 0042 04/14/12 1956 04/14/12 1556  GLUCAP 126* 148* 180* 175* 148*   A:  DM.  DKA secondary to noncompliance (history of noncompliance).  Hypothyroidism. Glucose well controlled now.  P:   Synthroid IV, convert when able to take PO Lantus, CBG at goal Diabetes coordinator assisting  NEUROLOGIC Head CT:  8/1 >>> nad  A:  Acute encephalopathy, metabolic and due to sepsis   Much improved 8/11  P: Cont  prn sedation   BEST PRACTICE / DISPOSITION Level of Care:  ICU Primary Service:  PCCM Consultants:  None Code Status:  Limited >> no CPR/defib Diet: TF's DVT Px:  Heparin  Drip-->SCDs due to anemia GI Px:  PPI Skin Integrity:  As above - per  wound care consult Social / Family: spoke with entire family 8/8    Reviewed above, examined pt, and agree with assessment/plan.  Try to keep in negative fluid balance to  assist with weaning FiO2/PEEP.  No ready for vent weaning yet.  Limited code>>if no significant improvement in next few days, will then need to d/w family about whether tracheostomy is reasonable option.  Critical care time 35 minutes.  Coralyn Helling, MD [facility] Pulmonary/Critical Care 04/15/2012, 11:19 AM Pager:  [phone] After 3pm call: [phone]

## 2012-04-15 NOTE — Progress Notes (Signed)
Nutrition Follow-up  Intervention:  1. Will continue current TF of Oxepa at 45 ml/hr plus Prostat BID. Provides 1820 kcal and 98 grams of protein daily.  2. RD to follow for nutrition plan of care.   Assessment:   Patient remains intubated on mechanical ventilation. Pt is tolerating TF's. Plans to wean from the vent in the next few days.   Diet Order:  Tube feeding Oxepa @ 45 ml/hr plus Prostat BID.   Meds: Scheduled Meds:   . albuterol  2.5 mg Nebulization Q6H  . antiseptic oral rinse  15 mL Mouth Rinse QID  . chlorhexidine  15 mL Mouth Rinse BID  . collagenase   Topical Daily  . feeding supplement  30 mL Oral BID WC  . furosemide  40 mg Intravenous Once  . furosemide  40 mg Intravenous Once  . insulin aspart  0-7 Units Subcutaneous Q4H  . insulin aspart  4 Units Subcutaneous Q4H  . insulin glargine  5 Units Subcutaneous QHS  . ipratropium  0.5 mg Nebulization Q6H  . levothyroxine  75 mcg Per Tube QAC breakfast  . pantoprazole (PROTONIX) IV  40 mg Intravenous Q24H  . potassium chloride  40 mEq Per Tube Once  . DISCONTD: piperacillin-tazobactam (ZOSYN)  IV  3.375 g Intravenous Q8H  . DISCONTD: polyethylene glycol  17 g Per Tube Daily  . DISCONTD: potassium chloride  20 mEq Per Tube Q8H   Continuous Infusions:   . sodium chloride 20 mL/hr (04/13/12 0200)  . feeding supplement (OXEPA) 1,000 mL (04/15/12 0912)  . DISCONTD: norepinephrine (LEVOPHED) Adult infusion Stopped (04/13/12 0800)   PRN Meds:.fentaNYL, midazolam  Labs:  CMP     Component Value Date/Time   NA 141 04/15/2012 0546   K 4.1 04/15/2012 0546   CL 102 04/15/2012 0546   CO2 32 04/15/2012 0546   GLUCOSE 147* 04/15/2012 0546   BUN 35* 04/15/2012 0546   CREATININE 0.88 04/15/2012 0546   CALCIUM 8.3* 04/15/2012 0546   PROT 5.7* 04/11/2012 0420   ALBUMIN 1.5* 04/11/2012 0420   AST 22 04/11/2012 0420   ALT 9 04/11/2012 0420   ALKPHOS 103 04/11/2012 0420   BILITOT 0.4 04/11/2012 0420   GFRNONAA 78* 04/15/2012 0546   GFRAA >90  04/15/2012 0546     Intake/Output Summary (Last 24 hours) at 04/15/12 1031 Last data filed at 04/15/12 0800  Gross per 24 hour  Intake   1410 ml  Output   2865 ml  Net  -1455 ml   Wt Readings from Last 10 Encounters:  04/15/12 156 lb 15.5 oz (71.2 kg)  11/22/11 138 lb 0.1 oz (62.6 kg)  10/20/11 138 lb (62.596 kg)    Weight Status:  156 lb.   Re-estimated needs:  1670-1870 kcal, 91-114 grams of protein  Nutrition Dx:  Inadequate Oral Intake, Ongoing   Goal:  1. Positive tolerance of TF, -Meeting, continue.  2. Meet > 90% of estimated energy needs with nutrition support. -Meeting, continue.   Monitor:  Extubation, TF tolerance, weights, labs, skin   Adron Bene [phone]

## 2012-04-16 ENCOUNTER — Inpatient Hospital Stay (HOSPITAL_COMMUNITY): Payer: Medicare Other

## 2012-04-16 LAB — GLUCOSE, CAPILLARY
Glucose-Capillary: 114 mg/dL — ABNORMAL HIGH (ref 70–99)
Glucose-Capillary: 127 mg/dL — ABNORMAL HIGH (ref 70–99)
Glucose-Capillary: 139 mg/dL — ABNORMAL HIGH (ref 70–99)
Glucose-Capillary: 147 mg/dL — ABNORMAL HIGH (ref 70–99)
Glucose-Capillary: 87 mg/dL (ref 70–99)
Glucose-Capillary: 109 mg/dL — ABNORMAL HIGH (ref 70–99)
Glucose-Capillary: 79 mg/dL (ref 70–99)
Glucose-Capillary: 157 mg/dL — ABNORMAL HIGH (ref 70–99)

## 2012-04-16 LAB — COMPREHENSIVE METABOLIC PANEL
ALT: 12 U/L (ref 0–53)
AST: 32 U/L (ref 0–37)
CO2: 33 mEq/L — ABNORMAL HIGH (ref 19–32)
Calcium: 8.2 mg/dL — ABNORMAL LOW (ref 8.4–10.5)
Chloride: 104 mEq/L (ref 96–112)
GFR calc non Af Amer: 75 mL/min — ABNORMAL LOW (ref >90)
Sodium: 144 mEq/L (ref 135–145)
Total Bilirubin: 0.3 mg/dL (ref 0.3–1.2)

## 2012-04-16 LAB — CBC
Platelets: 148 10*3/uL — ABNORMAL LOW (ref 150–400)
RBC: 2.46 MIL/uL — ABNORMAL LOW (ref 4.22–5.81)
WBC: 19.2 10*3/uL — ABNORMAL HIGH (ref 4.0–10.5)

## 2012-04-16 LAB — SEDIMENTATION RATE: Sed Rate: 131 mm/hr — ABNORMAL HIGH (ref 0–16)

## 2012-04-16 LAB — PRO B NATRIURETIC PEPTIDE: Pro B Natriuretic peptide (BNP): 2493 pg/mL — ABNORMAL HIGH (ref 0–450)

## 2012-04-16 LAB — IRON AND TIBC

## 2012-04-16 MED ORDER — PANTOPRAZOLE SODIUM 40 MG PO PACK
40.0000 mg | PACK | ORAL | Status: DC
  Administered 2012-04-16 – 2012-04-19 (×4): 40 mg
  Filled 2012-04-16 (×5): qty 20

## 2012-04-16 MED ORDER — HEPARIN SODIUM (PORCINE) 5000 UNIT/ML IJ SOLN
5000.0000 [IU] | Freq: Three times a day (TID) | INTRAMUSCULAR | Status: DC
  Administered 2012-04-16 – 2012-04-26 (×30): 5000 [IU] via SUBCUTANEOUS
  Filled 2012-04-16 (×33): qty 1

## 2012-04-16 MED ORDER — IPRATROPIUM BROMIDE 0.02 % IN SOLN
0.5000 mg | RESPIRATORY_TRACT | Status: DC | PRN
  Administered 2012-04-25: 0.5 mg via RESPIRATORY_TRACT
  Filled 2012-04-16: qty 2.5

## 2012-04-16 MED ORDER — METOPROLOL TARTRATE 25 MG/10 ML ORAL SUSPENSION
12.5000 mg | Freq: Two times a day (BID) | ORAL | Status: DC
  Administered 2012-04-16 – 2012-04-19 (×7): 12.5 mg
  Filled 2012-04-16 (×10): qty 5

## 2012-04-16 MED ORDER — ASPIRIN 81 MG PO CHEW
81.0000 mg | CHEWABLE_TABLET | Freq: Every day | ORAL | Status: DC
  Administered 2012-04-16 – 2012-04-26 (×11): 81 mg via ORAL
  Filled 2012-04-16 (×12): qty 1

## 2012-04-16 MED ORDER — METHYLPREDNISOLONE SODIUM SUCC 125 MG IJ SOLR
60.0000 mg | Freq: Four times a day (QID) | INTRAMUSCULAR | Status: DC
  Administered 2012-04-16 – 2012-04-18 (×8): 60 mg via INTRAVENOUS
  Filled 2012-04-16: qty 0.96
  Filled 2012-04-16: qty 2
  Filled 2012-04-16 (×3): qty 0.96
  Filled 2012-04-16 (×2): qty 2
  Filled 2012-04-16: qty 0.96
  Filled 2012-04-16: qty 2
  Filled 2012-04-16 (×3): qty 0.96

## 2012-04-16 MED ORDER — FUROSEMIDE 10 MG/ML IJ SOLN
40.0000 mg | Freq: Two times a day (BID) | INTRAMUSCULAR | Status: DC
  Administered 2012-04-16 (×2): 40 mg via INTRAVENOUS
  Filled 2012-04-16 (×4): qty 4

## 2012-04-16 MED ORDER — ALBUTEROL SULFATE (5 MG/ML) 0.5% IN NEBU
2.5000 mg | INHALATION_SOLUTION | RESPIRATORY_TRACT | Status: DC | PRN
  Administered 2012-04-25: 2.5 mg via RESPIRATORY_TRACT
  Filled 2012-04-16: qty 0.5

## 2012-04-16 NOTE — Progress Notes (Signed)
Name: [name] MRN: [id number] DOB: July 20, 1929    LOS: 12  Referring Provider:  EDP Reason for Referral:  DKA  PULMONARY / CRITICAL CARE MEDICINE    Brief patient description:  78 yo diabetic admit from home via [facility] ED with nausea, vomiting and altered mental status. Treated for DKA but course also significant for sepsis due to skin source vs PNA, NSTEMI with shock (septic + cardiogenic), resp failure.   Current Status: Decreased FiO2, but PEEP still up.  Vital Signs: Temp:  [97.7 F (36.5 C)-99.5 F (37.5 C)] 99.1 F (37.3 C) (08/13 0900) Pulse Rate:  [74-87] 86  (08/13 0900) Resp:  [17-28] 19  (08/13 0900) BP: (82-114)/(36-70) 100/37 mmHg (08/13 0900) SpO2:  [90 %-99 %] 97 % (08/13 0900) FiO2 (%):  [50 %-60 %] 50 % (08/13 0825) Weight:  [158 lb 8.2 oz (71.9 kg)] 158 lb 8.2 oz (71.9 kg) (08/13 0500) CVP:  [7 mmHg-14 mmHg] 7 mmHg  Intake/Output Summary (Last 24 hours) at 04/16/12 0909 Last data filed at 04/16/12 0700  Gross per 24 hour  Intake   1430 ml  Output   1630 ml  Net   -200 ml    Physical Examination: General:  Chronically ill appearing, orally intubated Neuro:  Following   Commands/ alert and interactive HEENT:  PERRL, dry membranes, # 8 ETT Neck:  Supple, no JVD Cardiovascular:  RRR, tachycardic Lungs:  Scattered rhonchi Abdomen:  Soft, non tender, bowel sounds present  Musculoskeletal:  Moves all extremities Skin:  R foot ulcer, sacral decubitus ulcer  Dg Chest Port 1 View  04/16/2012  *RADIOLOGY REPORT*  Clinical Data: Endotracheal tube placement.  PORTABLE CHEST - 1 VIEW  Comparison: 04/15/2012.  Findings: Endotracheal tube 38 mm from the carina.  Left IJ central line is unchanged.  Feeding tube is present with the tip not visualized.  Support apparatus appears stable compared yesterday's exam.  Allowing for differences in technique, there appears to be worsening pulmonary aeration, most compatible with increased pulmonary edema.  No focal  consolidation is identified.  IMPRESSION:  1.  Stable support apparatus. 2.  Worsening pulmonary aeration compatible with increased pulmonary edema.  Original Report Authenticated By: Andreas Newport, M.D.   Dg Chest Port 1 View  04/15/2012  *RADIOLOGY REPORT*  Clinical Data: Airspace disease.  Endotracheal tube position.  PORTABLE CHEST - 1 VIEW  Comparison: 04/13/2012.  Findings: The endotracheal tube is 5 cm from the carina.  Feeding tube and left subclavian central line remain present.  Allowing for differences in technique, there is improved pulmonary aeration, likely representing decreasing pulmonary edema based on the time interval for improvement.  Cardiopericardial silhouette and mediastinal contours are unchanged.  IMPRESSION:  1.  Endotracheal tube 5 cm from the carina.  Other support apparatus stable. 2.  Improving pulmonary aeration with persistent interstitial and airspace opacities.  Improved aeration is most compatible with decreasing pulmonary edema.  Original Report Authenticated By: Andreas Newport, M.D.    ASSESSMENT AND PLAN  PULMONARY  Lab 04/11/12 0358 04/10/12 0438  PHART 7.427 7.445  PCO2ART 37.2 35.4  PO2ART 77.3* 56.5*  HCO3 24.6* 24.2*  O2SAT 96.9 91.8   Ventilator Settings: Vent Mode:  [-] PRVC FiO2 (%):  [50 %-60 %] 50 % Set Rate:  [16 bmp] 16 bmp Vt Set:  [600 mL] 600 mL PEEP:  [10 cmH20] 10 cmH20 Plateau Pressure:  [24 cmH20-29 cmH20] 28 cmH20   ETT:  8/2>>>8/4 ETT:  8/5>>>  A:  Acute respiratory failure.  Septic shock skin source. Evolved progressive bilateral Pulmonary infiltrates c/w ARDS P:   ARDS protocol stopped 8/10 with severe air trapping Keep negative fluid balance as tolerated  Check ESR, BNP If no improvement in next several days, may need to discuss trach F/u CXR 8/14 Change nebs to prn  CARDIOVASCULAR  Lab 04/13/12 0525 04/11/12 0420 04/09/12 0930  TROPONINI <0.30 -- --  LATICACIDVEN -- -- 1.5  PROBNP -- 3477.0* --     Lines: CVL   L [place] 8/2>>  Echo 8/02>>EF 40 to 45%, diffuse hypokinesis, grade 1 diastolic dysfx, mod RV systolic dysfx  A:  Septic shock with NSTEMI and superimposed cardiogenic shock>>shock resolved.  Acute combined CHF with acute pulmonary edema.  Cardiology signed off 8/07. P:  Cards recommends to treat CAD medically.  Restart ASA 8/13 (held due to concern for GI bleeding) Add low dose lopressor  RENAL  Lab 04/16/12 0340 04/15/12 0546 04/13/12 0525 04/12/12 0400 04/11/12 0420  NA 144 141 137 135 136  K 3.9 4.1 -- -- --  CL 104 102 99 99 101  CO2 33* 32 29 28 26   BUN 42* 35* 32* 29* 24*  CREATININE 0.98 0.88 0.89 0.84 0.74  CALCIUM 8.2* 8.3* 8.1* 7.7* 7.7*  MG -- -- -- 1.8 --  PHOS -- -- -- 2.9 --   Intake/Output      08/12 0701 - 08/13 0700 08/13 0701 - 08/14 0700   I.V. (mL/kg) 480 (6.7)    NG/GT 1080    IV Piggyback     Total Intake(mL/kg) 1560 (21.7)    Urine (mL/kg/hr) 1780 (1)    Total Output 1780    Net -220          Foley:  8/1 >>> A:  Mild hyponatremia>>resolved P:  Monitor chemistry w/ diuresis   GASTROINTESTINAL  Lab 04/16/12 0340 04/11/12 0420 04/10/12 0325  AST 32 22 15  ALT 12 9 8   ALKPHOS 135* 103 102  BILITOT 0.3 0.4 0.5  PROT 5.6* 5.7* 5.7*  ALBUMIN 1.4* 1.5* 1.6*   A: ileus >> improved, now having diarrhea.  C diff negative 8/12. P:  adjust bowel regimen--->stopped miralax  A: Protein cal malnutrition P:   Tube feeds.    HEMATOLOGIC  Lab 04/16/12 0340 04/15/12 0546 04/13/12 0525 04/12/12 0400 04/11/12 0420  HGB 7.9* 8.2* 9.3* 7.6* 7.9*  HCT 23.5* 24.4* 26.9* 22.0* 23.1*  PLT 148* 132* 134* 156 196  INR -- -- -- -- --  APTT -- -- -- -- --   A:  Anemia.  No overt hemorrhage. 1u PRBC 8/9  With good response up to 9.3  8/10   P:  Trend CBC Cont PAS  Resume SQ heparin Check anemia panel   INFECTIOUS  Lab 04/16/12 0340 04/15/12 0546 04/13/12 0525 04/12/12 0400 04/11/12 0420  WBC 19.2* 20.6* 20.5* 16.6* 10.4  PROCALCITON 0.84 0.73 0.90  1.29 1.40   Cultures: 8/1 Blood >>> negative 8/1 MRSA screen: negative   Antibiotics: Vancomycin 8/1 (sepsis , skin source)>>> 8/8 Zosyn 8/1 (sepsis, skin source)>>> 8/12  A:  Possible infected diabetic ulcer / sacral decub ulcer.       Leukocytosis: PCT normal  P:   Monitor off Abx  ENDOCRINE  Lab 04/16/12 0804 04/16/12 0348 04/16/12 0340 04/15/12 2351 04/15/12 1940  GLUCAP 109* 87 79 127* 139*   A:  DM.  DKA secondary to noncompliance (history of noncompliance).  Hypothyroidism. Glucose well controlled now.  P:   Synthroid Lantus, CBG  at goal Diabetes coordinator assisting  NEUROLOGIC Head CT:  8/1 >>> nad  A:  Acute encephalopathy, metabolic and due to sepsis   Much improved 8/11  P: Cont  prn sedation   BEST PRACTICE / DISPOSITION Level of Care:  ICU Primary Service:  PCCM Consultants:  None Code Status:  Limited >> no CPR/defib Diet: TF's DVT Px:  Heparin  Drip-->SCDs due to anemia GI Px:  PPI Skin Integrity:  As above - per  wound care consult Social / Family: No family at bedside  Critical care time 35 minutes.  Coralyn Helling, MD [facility] Pulmonary/Critical Care 04/16/2012, 9:09 AM Pager:  [phone] After 3pm call: [phone]

## 2012-04-16 NOTE — Clinical Social Work Note (Signed)
CSW has attempted to contact wife at home number several times, as family are not present during this CSW's hours. CSW will continue to try to reach out to family to assess needs and offer support due to Pt's condition.   Doreen Salvage, LCSWA Clinical Social Worker [facility] Cell [phone]

## 2012-04-16 NOTE — Significant Event (Signed)
Spoke with pt's wife and daughter over the phone.  Explained current status.  Explained that he is not making as much progress with vent weaning.  Explained plan for continued diuresis.  Also explained that ESR is elevated so could have component of inflammation contributing to pulmonary infiltrates.  Will plan to add solumedrol and keep close monitoring of blood sugars.  Also discussed possible need for tracheostomy if he does not improve significantly over next several days.  Will continue to update family depending on progress.  Coralyn Helling, MD [facility] Pulmonary/Critical Care 04/16/2012, 12:03 PM Pager:  [phone] After 3pm call: [phone]

## 2012-04-17 ENCOUNTER — Inpatient Hospital Stay (HOSPITAL_COMMUNITY): Payer: Medicare Other

## 2012-04-17 ENCOUNTER — Encounter (HOSPITAL_COMMUNITY): Payer: Self-pay | Admitting: Registered Nurse

## 2012-04-17 LAB — BASIC METABOLIC PANEL
BUN: 55 mg/dL — ABNORMAL HIGH (ref 6–23)
Chloride: 105 mEq/L (ref 96–112)
GFR calc Af Amer: 73 mL/min — ABNORMAL LOW (ref >90)
GFR calc non Af Amer: 63 mL/min — ABNORMAL LOW (ref >90)
Glucose, Bld: 241 mg/dL — ABNORMAL HIGH (ref 70–99)
Potassium: 3.8 mEq/L (ref 3.5–5.1)
Sodium: 147 mEq/L — ABNORMAL HIGH (ref 135–145)

## 2012-04-17 LAB — CBC
HCT: 22.2 % — ABNORMAL LOW (ref 39.0–52.0)
Hemoglobin: 7.3 g/dL — ABNORMAL LOW (ref 13.0–17.0)
MCH: 32.3 pg (ref 26.0–34.0)
MCHC: 32.9 g/dL (ref 30.0–36.0)
RBC: 2.26 MIL/uL — ABNORMAL LOW (ref 4.22–5.81)

## 2012-04-17 LAB — GLUCOSE, CAPILLARY
Glucose-Capillary: 203 mg/dL — ABNORMAL HIGH (ref 70–99)
Glucose-Capillary: 207 mg/dL — ABNORMAL HIGH (ref 70–99)
Glucose-Capillary: 219 mg/dL — ABNORMAL HIGH (ref 70–99)
Glucose-Capillary: 227 mg/dL — ABNORMAL HIGH (ref 70–99)
Glucose-Capillary: 239 mg/dL — ABNORMAL HIGH (ref 70–99)
Glucose-Capillary: 294 mg/dL — ABNORMAL HIGH (ref 70–99)

## 2012-04-17 MED ORDER — FERROUS SULFATE 220 (44 FE) MG/5ML PO ELIX
220.0000 mg | ORAL_SOLUTION | Freq: Three times a day (TID) | ORAL | Status: DC
  Filled 2012-04-17 (×3): qty 5

## 2012-04-17 MED ORDER — ETOMIDATE 2 MG/ML IV SOLN
INTRAVENOUS | Status: DC | PRN
  Administered 2012-04-17: 12 mg via INTRAVENOUS

## 2012-04-17 MED ORDER — MIDAZOLAM HCL 5 MG/ML IJ SOLN
INTRAMUSCULAR | Status: AC
  Filled 2012-04-17: qty 1

## 2012-04-17 MED ORDER — INSULIN ASPART 100 UNIT/ML ~~LOC~~ SOLN
8.0000 [IU] | SUBCUTANEOUS | Status: DC
  Administered 2012-04-17 – 2012-04-20 (×5): 8 [IU] via SUBCUTANEOUS

## 2012-04-17 MED ORDER — ONDANSETRON HCL 4 MG/2ML IJ SOLN: 4.0000 mg | Freq: Four times a day (QID) | INTRAMUSCULAR | Status: DC | PRN

## 2012-04-17 MED ORDER — INSULIN GLARGINE 100 UNIT/ML ~~LOC~~ SOLN
10.0000 [IU] | Freq: Every day | SUBCUTANEOUS | Status: DC
  Administered 2012-04-17 – 2012-04-21 (×5): 10 [IU] via SUBCUTANEOUS

## 2012-04-17 MED ORDER — FERROUS SULFATE 300 (60 FE) MG/5ML PO SYRP
300.0000 mg | ORAL_SOLUTION | Freq: Three times a day (TID) | ORAL | Status: DC
  Administered 2012-04-17 – 2012-04-26 (×29): 300 mg
  Filled 2012-04-17 (×30): qty 5

## 2012-04-17 MED ORDER — FUROSEMIDE 8 MG/ML PO SOLN
40.0000 mg | Freq: Every day | ORAL | Status: DC
  Administered 2012-04-17: 40 mg
  Filled 2012-04-17 (×2): qty 5

## 2012-04-17 MED ORDER — SUCCINYLCHOLINE CHLORIDE 20 MG/ML IJ SOLN
INTRAMUSCULAR | Status: DC | PRN
  Administered 2012-04-17: 80 mg via INTRAVENOUS

## 2012-04-17 NOTE — Progress Notes (Signed)
Called by nursing staff; patient had tube feeding consistency emesis.   Start zofran prn for nausea. We will hold tube feedings overnight.

## 2012-04-17 NOTE — Progress Notes (Signed)
RT called to room for patient with increased peak pressures and exhaled Vt low. Put air into cuff and pilot balloon immediately deflated, patient was also able to speak around cuff. RT called Elink and anesthesia. Anesthesia arrived and used a tube exchanger to exchange patients ET tube. Replaced with another size 8 tube, 21@ the lip with positive color change on CO2 detector and bilateral breath sounds confirmed by anesthesia. Xray called to verify tube placement. Patient suctioned and peak pressures returned to 24. Patient remained stable throughout procedure.

## 2012-04-17 NOTE — Progress Notes (Signed)
Name: [name] MRN: [id number] DOB: 06-16-1929    LOS: 13  Referring Provider:  EDP Reason for Referral:  DKA  PULMONARY / CRITICAL CARE MEDICINE    Brief patient description:  78 yo diabetic admit from home via [facility] ED with nausea, vomiting and altered mental status. Treated for DKA but course also significant for sepsis due to skin source vs PNA, NSTEMI with shock (septic + cardiogenic), resp failure.   Current Status: Tolerating some pressure support.  Vital Signs: Temp:  [97 F (36.1 C)-99.7 F (37.6 C)] 97.7 F (36.5 C) (08/14 0700) Pulse Rate:  [57-87] 61  (08/14 0845) Resp:  [16-28] 19  (08/14 0845) BP: (63-113)/(29-48) 104/38 mmHg (08/14 0845) SpO2:  [92 %-100 %] 99 % (08/14 0700) FiO2 (%):  [40 %-50 %] 40 % (08/14 0845) Weight:  [155 lb 6.8 oz (70.5 kg)] 155 lb 6.8 oz (70.5 kg) (08/14 0300) CVP:  [5 mmHg] 5 mmHg  Intake/Output Summary (Last 24 hours) at 04/17/12 0904 Last data filed at 04/17/12 0700  Gross per 24 hour  Intake   1370 ml  Output   2295 ml  Net   -925 ml    Physical Examination: General:  Chronically ill appearing, orally intubated Neuro:  Following   Commands/ alert and interactive HEENT:  PERRL, dry membranes, # 8 ETT Neck:  Supple, no JVD Cardiovascular:  RRR, tachycardic Lungs:  Scattered rhonchi Abdomen:  Soft, non tender, bowel sounds present  Musculoskeletal:  Moves all extremities Skin:  R foot ulcer, sacral decubitus ulcer  Dg Abd 1 View  04/16/2012  *RADIOLOGY REPORT*  Clinical Data: Panda placement.  ABDOMEN - 1 VIEW  Comparison: 04/12/2012  Findings: Panda feeding tube extends to the gastric fundus.  Normal bowel gas pattern.  Pelvic vascular calcifications.  Coarse airspace infiltrates in the visualized lung bases. Sclerotic changes in the   left acetabulum and femoral head, partially visualized.  IMPRESSION:  1.  Feeding tube to   gastric fundus.  Original Report Authenticated By: Osa Craver, M.D.   Dg Chest Port 1  View  04/17/2012  *RADIOLOGY REPORT*  Clinical Data: Evaluate endotracheal tube.  Endotracheal tube change.  Leaking cuff.  PORTABLE CHEST - 1 VIEW  Comparison: 04/16/2012.  Findings: Endotracheal tube is present with the tip 6.7 cm from the carina, at the level of the clavicles.  The other support apparatus appears unchanged.  Allowing for technical differences, pulmonary aeration is unchanged with diffuse perihilar airspace opacity. No pneumothorax.  Cardiopericardial silhouette and mediastinal contours unchanged.  IMPRESSION: Replacement of endotracheal tube with the tip 67 mm from the carina.  Otherwise no interval changes.  Original Report Authenticated By: Andreas Newport, M.D.   Dg Chest Port 1 View  04/16/2012  *RADIOLOGY REPORT*  Clinical Data: Endotracheal tube placement.  PORTABLE CHEST - 1 VIEW  Comparison: 04/15/2012.  Findings: Endotracheal tube 38 mm from the carina.  Left IJ central line is unchanged.  Feeding tube is present with the tip not visualized.  Support apparatus appears stable compared yesterday's exam.  Allowing for differences in technique, there appears to be worsening pulmonary aeration, most compatible with increased pulmonary edema.  No focal consolidation is identified.  IMPRESSION:  1.  Stable support apparatus. 2.  Worsening pulmonary aeration compatible with increased pulmonary edema.  Original Report Authenticated By: Andreas Newport, M.D.    ASSESSMENT AND PLAN  PULMONARY  Lab 04/11/12 0358  PHART 7.427  PCO2ART 37.2  PO2ART 77.3*  HCO3 24.6*  O2SAT  96.9   Ventilator Settings: Vent Mode:  [-] PRVC FiO2 (%):  [40 %-50 %] 40 % Set Rate:  [16 bmp] 16 bmp Vt Set:  [600 mL] 600 mL PEEP:  [8 cmH20-10 cmH20] 8 cmH20 Plateau Pressure:  [22 cmH20-30 cmH20] 24 cmH20   ETT:  8/2>>>8/4 ETT:  8/5>>>  A:  Acute respiratory failure in setting of septic shock skin and DKA. Pulmonary infiltrates>>not much improvement with diuresis.  ESR 131 from 8/13. P:   Added  solumedrol 8/13 Keep in negative fluid balance>>change lasix to 40 mg per tube daily Adjust FiO2/PEEP to keep SpO2 > 88% Pressure support wean as tolerated If no improvement in next several days, may need to discuss trach F/u CXR 8/15 Nebs to prn  CARDIOVASCULAR  Lab 04/16/12 1020 04/13/12 0525 04/11/12 0420  TROPONINI -- <0.30 --  LATICACIDVEN -- -- --  PROBNP 2493.0* -- 3477.0*     Lines: CVL  L [place] 8/2>>8/14  Echo 8/02>>EF 40 to 45%, diffuse hypokinesis, grade 1 diastolic dysfx, mod RV systolic dysfx  A:  Septic shock>>resolved. NSTEMI and superimposed cardiogenic shock>>shock resolved. Acute combined CHF with acute pulmonary edema>>improved. Cardiology signed off 8/07>>recommended medical management. P:  Restarted ASA 8/13 (held due to concern for GI bleeding) Continue low dose lopressor  RENAL  Lab 04/17/12 0430 04/16/12 0340 04/15/12 0546 04/13/12 0525 04/12/12 0400  NA 147* 144 141 137 135  K 3.8 3.9 -- -- --  CL 105 104 102 99 99  CO2 33* 33* 32 29 28  BUN 55* 42* 35* 32* 29*  CREATININE 1.07 0.98 0.88 0.89 0.84  CALCIUM 8.3* 8.2* 8.3* 8.1* 7.7*  MG -- -- -- -- 1.8  PHOS -- -- -- -- 2.9   Intake/Output      08/13 0701 - 08/14 0700 08/14 0701 - 08/15 0700   I.V. (mL/kg) 480 (6.8)    NG/GT 1020    Total Intake(mL/kg) 1500 (21.3)    Urine (mL/kg/hr) 2370 (1.4)    Total Output 2370    Net -870         Stool Occurrence 4 x     Foley:  8/1 >>>  A:  Mild hyponatremia>>resolved P:  Monitor chemistry w/ diuresis   GASTROINTESTINAL  Lab 04/16/12 0340 04/11/12 0420  AST 32 22  ALT 12 9  ALKPHOS 135* 103  BILITOT 0.3 0.4  PROT 5.6* 5.7*  ALBUMIN 1.4* 1.5*   A: ileus >>resolved.   Diarrhea (C diff negative 8/12)>>improved. Nutrition, protein calorie malnutrition. P: Tolerating tube feeds    HEMATOLOGIC  Lab 04/17/12 0430 04/16/12 0340 04/15/12 0546 04/13/12 0525 04/12/12 0400  HGB 7.3* 7.9* 8.2* 9.3* 7.6*  HCT 22.2* 23.5* 24.4* 26.9* 22.0*    PLT 204 148* 132* 134* 156  INR -- -- -- -- --  APTT -- -- -- -- --   A:  Anemia of critical illness, chronic disease, and iron deficiency.  Required 1u PRBC 8/9. P:  F/u CBC Add FeSO4 8/14 Transfuse for Hb < 7   INFECTIOUS  Lab 04/17/12 0430 04/16/12 0340 04/15/12 0546 04/13/12 0525 04/12/12 0400 04/11/12 0420  WBC 26.1* 19.2* 20.6* 20.5* 16.6* --  PROCALCITON -- 0.84 0.73 0.90 1.29 1.40   Cultures: 8/1 Blood >>> negative 8/1 MRSA screen: negative   Antibiotics: Vancomycin 8/1 (sepsis , skin source)>>> 8/8 Zosyn 8/1 (sepsis, skin source)>>> 8/12  A:  Possible infected diabetic ulcer / sacral decub ulcer. Leukocytosis: PCT normal  P:   Monitor off Abx D/C  central line 8/14  ENDOCRINE  Lab 04/16/12 1153 04/16/12 0804 04/16/12 0348 04/16/12 0340 04/15/12 2351  GLUCAP 157* 109* 87 79 127*   A:  DM.  DKA secondary to noncompliance (history of noncompliance).   Hypothyroidism. P:   Synthroid Lantus, CBG at goal Diabetes coordinator assisting  NEUROLOGIC Head CT:  8/1 >>> nad  A:  Acute encephalopathy, metabolic and due to sepsis>>resolved. P: Prn sedation while on vent   BEST PRACTICE / DISPOSITION Level of Care:  ICU Primary Service:  PCCM Consultants:  None Code Status:  Limited >> no CPR/defib Diet: TF's DVT Px:  SQ heparin GI Px:  PPI Skin Integrity:  As above - per  wound care consult Social / Family: No family at bedside  Critical care time 35 minutes.  Coralyn Helling, MD [facility] Pulmonary/Critical Care 04/17/2012, 9:04 AM Pager:  [phone] After 3pm call: [phone]

## 2012-04-18 ENCOUNTER — Inpatient Hospital Stay (HOSPITAL_COMMUNITY): Payer: Medicare Other

## 2012-04-18 LAB — GLUCOSE, CAPILLARY
Glucose-Capillary: 213 mg/dL — ABNORMAL HIGH (ref 70–99)
Glucose-Capillary: 230 mg/dL — ABNORMAL HIGH (ref 70–99)
Glucose-Capillary: 270 mg/dL — ABNORMAL HIGH (ref 70–99)
Glucose-Capillary: 151 mg/dL — ABNORMAL HIGH (ref 70–99)
Glucose-Capillary: 165 mg/dL — ABNORMAL HIGH (ref 70–99)
Glucose-Capillary: 224 mg/dL — ABNORMAL HIGH (ref 70–99)
Glucose-Capillary: 232 mg/dL — ABNORMAL HIGH (ref 70–99)
Glucose-Capillary: 217 mg/dL — ABNORMAL HIGH (ref 70–99)

## 2012-04-18 LAB — CBC
HCT: 21.5 % — ABNORMAL LOW (ref 39.0–52.0)
MCH: 32.9 pg (ref 26.0–34.0)
MCH: 31.8 pg (ref 26.0–34.0)
MCHC: 31.9 g/dL (ref 30.0–36.0)
MCV: 99.5 fL (ref 78.0–100.0)
Platelets: 263 10*3/uL (ref 150–400)
RDW: 16.3 % — ABNORMAL HIGH (ref 11.5–15.5)
WBC: 28.6 10*3/uL — ABNORMAL HIGH (ref 4.0–10.5)

## 2012-04-18 LAB — BASIC METABOLIC PANEL
BUN: 67 mg/dL — ABNORMAL HIGH (ref 6–23)
Calcium: 8.6 mg/dL (ref 8.4–10.5)
GFR calc non Af Amer: 68 mL/min — ABNORMAL LOW (ref >90)
Glucose, Bld: 184 mg/dL — ABNORMAL HIGH (ref 70–99)

## 2012-04-18 LAB — COMPREHENSIVE METABOLIC PANEL
AST: 20 U/L (ref 0–37)
Albumin: 1.4 g/dL — ABNORMAL LOW (ref 3.5–5.2)
Alkaline Phosphatase: 111 U/L (ref 39–117)
BUN: 68 mg/dL — ABNORMAL HIGH (ref 6–23)
Potassium: 3.4 mEq/L — ABNORMAL LOW (ref 3.5–5.1)
Sodium: 145 mEq/L (ref 135–145)
Total Protein: 6 g/dL (ref 6.0–8.3)

## 2012-04-18 MED ORDER — FREE WATER
200.0000 mL | Freq: Three times a day (TID) | Status: DC
  Administered 2012-04-18 – 2012-04-19 (×4): 200 mL

## 2012-04-18 MED ORDER — HYDROCODONE-ACETAMINOPHEN 7.5-500 MG/15ML PO SOLN: 10.0000 mL | ORAL | Status: DC | PRN

## 2012-04-18 MED ORDER — METHYLPREDNISOLONE SODIUM SUCC 125 MG IJ SOLR
60.0000 mg | Freq: Three times a day (TID) | INTRAMUSCULAR | Status: DC
  Administered 2012-04-18 – 2012-04-19 (×3): 60 mg via INTRAVENOUS
  Filled 2012-04-18 (×3): qty 0.96

## 2012-04-18 MED ORDER — POTASSIUM CHLORIDE 10 MEQ/100ML IV SOLN
10.0000 meq | INTRAVENOUS | Status: AC
  Administered 2012-04-18 (×2): 10 meq via INTRAVENOUS
  Filled 2012-04-18 (×2): qty 100

## 2012-04-18 NOTE — Progress Notes (Signed)
Pola Corn MD notified RN to advance Panda feeding tube at least 10 cm. RN advanced Panda, KUB ordered to confirm placement.

## 2012-04-18 NOTE — Progress Notes (Signed)
Name: [name] MRN: [id number] DOB: [birth year]    LOS: 14  Referring Provider:  EDP Reason for Referral:  DKA  PULMONARY / CRITICAL CARE MEDICINE    Brief patient description:  78 yo diabetic admit from home via [facility] ED with nausea, vomiting and altered mental status. Treated for DKA but course also significant for sepsis due to skin source vs PNA, NSTEMI with shock (septic + cardiogenic), resp failure.   Current Status: Passed SBT this am. Ready for extubation.   Vital Signs: Temp:  [97.3 F (36.3 C)-98.6 F (37 C)] 97.5 F (36.4 C) (08/15 0600) Pulse Rate:  [54-63] 54  (08/15 0600) Resp:  [7-35] 17  (08/15 0600) BP: (100-124)/(38-52) 120/48 mmHg (08/15 0600) SpO2:  [93 %-100 %] 100 % (08/15 0600) FiO2 (%):  [30 %-40 %] 30 % (08/15 0803) Weight:  [69.5 kg (153 lb 3.5 oz)] 69.5 kg (153 lb 3.5 oz) (08/15 0000)    Intake/Output Summary (Last 24 hours) at 04/18/12 0830 Last data filed at 04/18/12 0600  Gross per 24 hour  Intake   1025 ml  Output   1330 ml  Net   -305 ml    Physical Examination: General:  Chronically ill appearing, orally intubated Neuro:  Following   Commands/ alert and interactive HEENT:  PERRL, dry membranes, # 8 ETT Neck:  Supple, no JVD Cardiovascular:  RRR, tachycardic Lungs:  Scattered rhonchi, excellevt Vt on SBT Abdomen:  Soft, non tender, bowel sounds present  Musculoskeletal:  Moves all extremities, able to lift head and sit up in bed w/ minimal assist.  Skin:  R foot ulcer, sacral decubitus ulcer  Dg Abd 1 View  04/18/2012  *RADIOLOGY REPORT*  Clinical Data: Feeding tube placement.  ABDOMEN - 1 VIEW  Comparison: 04/18/2012.  Findings: Weighted tip feeding tube is present with redundant loop in the proximal stomach.  The weighted tip is in the proximal fundus.  Bowel gas pattern is nonobstructive.  Severe left hip osteoarthritis incidentally noted.  Compared to prior, the tube has been advanced.  IMPRESSION:  Weighted tip feeding tube  present in the gastric fundus, with redundant loop in the proximal fundus.  Original Report Authenticated By: Andreas Newport, M.D.   Dg Abd 1 View  04/18/2012  *RADIOLOGY REPORT*  Clinical Data: Vomiting, feeding tube placement.  ABDOMEN - 1 VIEW  Comparison: 04/16/2012  Findings: Panda feeding tube tip extends just beyond the GE junction.  Visualized bowel gas pattern unremarkable.  The lower abdomen is excluded.  Coarse airspace opacities are noted in the visualized lung bases.  IMPRESSION:  Feeding tube tip just beyond the GE junction.  Original Report Authenticated By: Osa Craver, M.D.   Dg Abd 1 View  04/16/2012  *RADIOLOGY REPORT*  Clinical Data: Panda placement.  ABDOMEN - 1 VIEW  Comparison: 04/12/2012  Findings: Panda feeding tube extends to the gastric fundus.  Normal bowel gas pattern.  Pelvic vascular calcifications.  Coarse airspace infiltrates in the visualized lung bases. Sclerotic changes in the   left acetabulum and femoral head, partially visualized.  IMPRESSION:  1.  Feeding tube to   gastric fundus.  Original Report Authenticated By: Osa Craver, M.D.   Dg Chest Port 1 View  04/18/2012  *RADIOLOGY REPORT*  Clinical Data: Lung infiltrates, follow-up  PORTABLE CHEST - 1 VIEW  Comparison: Portable chest x-ray of 04/17/2012  Findings: Aeration has improved slightly.  However there is still diffuse airspace disease bilaterally.  Endotracheal tube and feeding tube  remain.  No pneumothorax is seen.  Heart size is stable.  IMPRESSION: Slightly better aeration.  Persistent diffuse airspace disease.  Original Report Authenticated By: Juline Patch, M.D.   Dg Chest Port 1 View  04/17/2012  *RADIOLOGY REPORT*  Clinical Data: Evaluate endotracheal tube.  Endotracheal tube change.  Leaking cuff.  PORTABLE CHEST - 1 VIEW  Comparison: 04/16/2012.  Findings: Endotracheal tube is present with the tip 6.7 cm from the carina, at the level of the clavicles.  The other support  apparatus appears unchanged.  Allowing for technical differences, pulmonary aeration is unchanged with diffuse perihilar airspace opacity. No pneumothorax.  Cardiopericardial silhouette and mediastinal contours unchanged.  IMPRESSION: Replacement of endotracheal tube with the tip 67 mm from the carina.  Otherwise no interval changes.  Original Report Authenticated By: Andreas Newport, M.D.  8/15 clearing bilateral diffuse airspace disease.   ASSESSMENT AND PLAN  PULMONARY No results found for this basename: PHART:5,PCO2:5,PCO2ART:5,PO2ART:5,HCO3:5,O2SAT:5 in the last 168 hours Ventilator Settings: Vent Mode:  [-] CPAP FiO2 (%):  [30 %-40 %] 30 % Set Rate:  [16 bmp] 16 bmp Vt Set:  [600 mL] 600 mL PEEP:  [5 cmH20-8 cmH20] 8 cmH20 Pressure Support:  [5 cmH20-10 cmH20] 5 cmH20 Plateau Pressure:  [23 cmH20-26 cmH20] 25 cmH20   ETT:  8/2>>>8/4 ETT:  8/5>>>8/15  A:  Acute respiratory failure in setting of septic shock skin and DKA. Pulmonary infiltrates>>not much improvement with diuresis.  ESR 131 from 8/13. Treating as FP ARDS w/steroid since 8/13. Looking much better clinically. Ready for extubation on 8/15 P:   Continue solumedrol Keep even fluid balance Extubate and titrate FIO2 for sats >88% pulm hygiene s/p extubation Will need swallow study before diet.   CARDIOVASCULAR  Lab 04/16/12 1020 04/13/12 0525  TROPONINI -- <0.30  LATICACIDVEN -- --  PROBNP 2493.0* --     Lines: CVL  L Miranda 8/2>>8/14  Echo 8/02>>EF 40 to 45%, diffuse hypokinesis, grade 1 diastolic dysfx, mod RV systolic dysfx  A:  Septic shock>>resolved. NSTEMI and superimposed cardiogenic shock>>shock resolved. Acute combined CHF with acute pulmonary edema>>improved. Cardiology signed off 8/07>>recommended medical management. P:  Restarted ASA 8/13 (held due to concern for GI bleeding) Continue low dose lopressor  RENAL  Lab 04/18/12 0310 04/17/12 2353 04/17/12 0430 04/16/12 0340 04/15/12 0546 04/12/12 0400    NA 149* 145 147* 144 141 --  K 3.4* 3.4* -- -- -- --  CL 107 104 105 104 102 --  CO2 34* 33* 33* 33* 32 --  BUN 67* 68* 55* 42* 35* --  CREATININE 1.01 1.05 1.07 0.98 0.88 --  CALCIUM 8.6 8.6 8.3* 8.2* 8.3* --  MG -- -- -- -- -- 1.8  PHOS -- -- -- -- -- 2.9   Intake/Output      08/14 0701 - 08/15 0700 08/15 0701 - 08/16 0700   I.V. (mL/kg) 270 (3.9)    NG/GT 720    IV Piggyback 100    Total Intake(mL/kg) 1090 (15.7)    Urine (mL/kg/hr) 1370 (0.8)    Total Output 1370    Net -280         Stool Occurrence 3 x     Foley:  8/1 >>>  A:  Mild hypernatremia P:  Free water, if he can take pos later today this will autoregulate via po intake.   GASTROINTESTINAL  Lab 04/17/12 2353 04/16/12 0340  AST 20 32  ALT 11 12  ALKPHOS 111 135*  BILITOT 0.3 0.3  PROT 6.0 5.6*  ALBUMIN 1.4* 1.4*   A: ileus >>resolved.   Diarrhea (C diff negative 8/12)>>improved. Nutrition, protein calorie malnutrition. P: Tolerating tube feeds    HEMATOLOGIC  Lab 04/18/12 0310 04/17/12 2353 04/17/12 0430 04/16/12 0340 04/15/12 0546  HGB 7.6* 7.1* 7.3* 7.9* 8.2*  HCT 23.8* 21.5* 22.2* 23.5* 24.4*  PLT 263 242 204 148* 132*  INR -- -- -- -- --  APTT -- -- -- -- --   A:  Anemia of critical illness, chronic disease, and iron deficiency.  Required 1u PRBC 8/9. P:  F/u CBC Added FeSO4 8/14 Transfuse for Hb < 7   INFECTIOUS  Lab 04/18/12 0310 04/17/12 2353 04/17/12 0430 04/16/12 0340 04/15/12 0546 04/13/12 0525 04/12/12 0400  WBC 28.6* 28.6* 26.1* 19.2* 20.6* -- --  PROCALCITON -- -- -- 0.84 0.73 0.90 1.29   Cultures: 8/1 Blood >>> negative 8/1 MRSA screen: negative   Antibiotics: Vancomycin 8/1 (sepsis , skin source)>>> 8/8 Zosyn 8/1 (sepsis, skin source)>>> 8/12  A:  Possible infected diabetic ulcer / sacral decub ulcer. Leukocytosis: PCT normal, D/C'd central line 8/14-->suspect steroids P:   Monitor off Abx   ENDOCRINE  Lab 04/18/12 0745 04/18/12 0354 04/18/12 0012 04/17/12  1920 04/17/12 1535  GLUCAP 151* 165* 213* 270* 230*   A:  DM.  DKA secondary to noncompliance (history of noncompliance).   Hypothyroidism. P:   Synthroid Lantus and ssi w/ basal dosing. Will hold off from changes as will be holding tubefeeds.  Diabetes coordinator assisting  NEUROLOGIC Head CT:  8/1 >>> nad  A:  Acute encephalopathy, metabolic and due to sepsis>>resolved. P: D/c sedation, will cont low dose PRN analgesia as needed for dressing changes.    BEST PRACTICE / DISPOSITION Level of Care:  ICU Primary Service:  PCCM Consultants:  None Code Status:  Limited >> no CPR/defib Diet: TF's DVT Px:  SQ heparin GI Px:  PPI Skin Integrity:  As above - per  wound care consult Social / Family: No family at bedside   Reviewed above, examined pt, and agree with assessment/plan.  He has done well since starting solumedrol, and extubated this AM.  Will get speech to assess swallowing>>keep panda in pending this eval.  Will get OOB and ask PT/OT to assess.  Coralyn Helling, MD [facility] Pulmonary/Critical Care 04/18/2012, 8:30 AM Pager:  [phone] After 3pm call: [phone]

## 2012-04-18 NOTE — Progress Notes (Signed)
Hypokalemia   Replaced  

## 2012-04-18 NOTE — Evaluation (Signed)
Clinical/Bedside Swallow Evaluation Patient Details  Name: [name] MRN: [id number] Date of Birth: May 02, 1929  Today's Date: 04/18/2012 Time: 7829-5621 SLP Time Calculation (min): 41 min  Past Medical History:  Past Medical History  Diagnosis Date  . Diabetes mellitus   . Cancer   . Thyroid disease   . Anemia   . Hyperlipidemia   . Neuropathy, diabetic   . Diabetic retinopathy    Past Surgical History:  Past Surgical History  Procedure Date  . Cataract extraction, bilateral   . Colonoscopy w/ polypectomy    HPI:  78 yo diabetic sent to [facility] ED with nausea, vomiting and altered mental status.  Apparently was withholding insulin as he did not feel well for several weeks.  Found to be hyperglycemic / acidotic.  Patient required  intubation on 8/2 and was just extubated this morning at *:847   Assessment / Plan / Recommendation Clinical Impression  Patient s/p extubation just this am, and has been intubated for 13 days.  Currently patient exhibits s/s of aspiration at bedside, including audible throat clearing after ice, coughing and decreased SP02 after 1 teaspoon sip of H20; suspected delay to initiate the swallow, and sluggish/decreased laryngeal elevation, resulting in multiple swallows on one bite of applesauce.  Patient reports the puree stuck in his throat.  Patient would benefit from a MBS prior to initiating p.o.'s.     Aspiration Risk  Severe    Diet Recommendation NPO        Other  Recommendations Other Recommendations: Have oral suction available   Follow Up Recommendations       Frequency and Duration        Pertinent Vitals/Pain          Swallow Study Prior Functional Status       General HPI: 78 yo diabetic sent to [facility] ED with nausea, vomiting and altered mental status.  Apparently was withholding insulin as he did not feel well for several weeks.  Found to be hyperglycemic / acidotic.  Patient required  intubation on 8/2 and was just extubated this  morning at *:847 Type of Study: Bedside swallow evaluation Diet Prior to this Study: NPO;Panda Temperature Spikes Noted: No Respiratory Status: Supplemental O2 delivered via (comment) History of Recent Intubation: Yes Length of Intubations (days): 13 days Date extubated: 04/18/12 Behavior/Cognition: Alert;Cooperative Oral Cavity - Dentition: Edentulous;Missing dentition Self-Feeding Abilities: Needs assist Patient Positioning: Upright in chair Baseline Vocal Quality: Breathy;Hoarse Volitional Cough: Weak Volitional Swallow: Unable to elicit    Oral/Motor/Sensory Function Overall Oral Motor/Sensory Function: Appears within functional limits for tasks assessed   Ice Chips Ice chips: Impaired Presentation: Spoon Pharyngeal Phase Impairments: Suspected delayed Swallow;Decreased hyoid-laryngeal movement;Throat Clearing - Immediate;Throat Clearing - Delayed   Thin Liquid Thin Liquid: Impaired Presentation: Spoon Pharyngeal  Phase Impairments: Suspected delayed Swallow;Decreased hyoid-laryngeal movement;Cough - Delayed;Change in Vital Signs (Sats decreased to 77% then came back up)    Nectar Thick Nectar Thick Liquid: Not tested   Honey Thick Honey Thick Liquid: Not tested   Puree Puree: Impaired Presentation: Spoon Pharyngeal Phase Impairments: Suspected delayed Swallow;Decreased hyoid-laryngeal movement;Multiple swallows;Throat Clearing - Delayed;Change in Vital Signs (Sats decreased to 84%)   Solid   GO    Solid: Not tested       Maryjo Rochester T 04/18/2012,4:05 PM

## 2012-04-18 NOTE — Progress Notes (Signed)
Patient extubated to 4 LPM [place] with out complications. No stridor noted. BBS equally clear. No dyspnea noted. FIO2 weaned to 3 LPM due to O2 sats being 96%. Patient has a strong cough. HR=57 RR=16.

## 2012-04-18 NOTE — Progress Notes (Signed)
CARE MANAGEMENT NOTE 04/18/2012  Patient:  [name],[name]   Account Number:  [id number]  Date Initiated:  04/05/2012  Documentation initiated by:  DAVIS,RHONDA  Subjective/Objective Assessment:   patient with known history of dka, not taking insulin presented with bld glucose over800, required intubation.     Action/Plan:   from home, very noncompliant with lifestyle and meds   Anticipated DC Date:  04/21/2012   Anticipated DC Plan:  LONG TERM ACUTE CARE (LTAC)  In-house referral  NA      DC Planning Services  CM consult      PAC Choice  NA   Choice offered to / List presented to:  NA   DME arranged  NA      DME agency  NA     HH arranged  NA      HH agency  NA   Status of service:  In process, will continue to follow Medicare Important Message given?  NA - LOS <3 / Initial given by admissions (If response is "NO", the following Medicare IM given date fields will be blank) Date Medicare IM given:   Date Additional Medicare IM given:    Discharge Disposition:    Per UR Regulation:  Reviewed for med. necessity/level of care/duration of stay  If discussed at Long Length of Stay Meetings, dates discussed:    Comments:  [id number] Earlene Plater, RN, BSN, CCM: CHART REVIEWED AND UPDATED. Referal for ltac placement done and rep. notified.  Patient was extubated this am , o2 wavering but holding. CASE MANAGEMENT [phone]    [id number]/Rhonda Earlene Plater, RN, BSN, CCM: CHART REVIEWED AND UPDATED. NO DISCHARGE NEEDS PRESENT AT THIS TIME. CASE MANAGEMENT [phone]

## 2012-04-19 DIAGNOSIS — E039 Hypothyroidism, unspecified: Secondary | ICD-10-CM

## 2012-04-19 DIAGNOSIS — R918 Other nonspecific abnormal finding of lung field: Secondary | ICD-10-CM

## 2012-04-19 DIAGNOSIS — L89109 Pressure ulcer of unspecified part of back, unspecified stage: Secondary | ICD-10-CM

## 2012-04-19 DIAGNOSIS — R5381 Other malaise: Secondary | ICD-10-CM

## 2012-04-19 DIAGNOSIS — I519 Heart disease, unspecified: Secondary | ICD-10-CM

## 2012-04-19 DIAGNOSIS — L8990 Pressure ulcer of unspecified site, unspecified stage: Secondary | ICD-10-CM

## 2012-04-19 DIAGNOSIS — E870 Hyperosmolality and hypernatremia: Secondary | ICD-10-CM

## 2012-04-19 DIAGNOSIS — R1310 Dysphagia, unspecified: Secondary | ICD-10-CM

## 2012-04-19 LAB — GLUCOSE, CAPILLARY
Glucose-Capillary: 128 mg/dL — ABNORMAL HIGH (ref 70–99)
Glucose-Capillary: 168 mg/dL — ABNORMAL HIGH (ref 70–99)
Glucose-Capillary: 173 mg/dL — ABNORMAL HIGH (ref 70–99)
Glucose-Capillary: 186 mg/dL — ABNORMAL HIGH (ref 70–99)
Glucose-Capillary: 190 mg/dL — ABNORMAL HIGH (ref 70–99)
Glucose-Capillary: 203 mg/dL — ABNORMAL HIGH (ref 70–99)
Glucose-Capillary: 211 mg/dL — ABNORMAL HIGH (ref 70–99)

## 2012-04-19 LAB — CBC
HCT: 26.3 % — ABNORMAL LOW (ref 39.0–52.0)
Hemoglobin: 8.2 g/dL — ABNORMAL LOW (ref 13.0–17.0)
MCV: 102.3 fL — ABNORMAL HIGH (ref 78.0–100.0)
RBC: 2.57 MIL/uL — ABNORMAL LOW (ref 4.22–5.81)
RDW: 16.2 % — ABNORMAL HIGH (ref 11.5–15.5)
WBC: 22 10*3/uL — ABNORMAL HIGH (ref 4.0–10.5)

## 2012-04-19 LAB — COMPREHENSIVE METABOLIC PANEL
Alkaline Phosphatase: 89 U/L (ref 39–117)
BUN: 64 mg/dL — ABNORMAL HIGH (ref 6–23)
CO2: 34 mEq/L — ABNORMAL HIGH (ref 19–32)
Chloride: 108 mEq/L (ref 96–112)
Creatinine, Ser: 0.96 mg/dL (ref 0.50–1.35)
GFR calc Af Amer: 88 mL/min — ABNORMAL LOW (ref >90)
GFR calc non Af Amer: 76 mL/min — ABNORMAL LOW (ref >90)
Glucose, Bld: 183 mg/dL — ABNORMAL HIGH (ref 70–99)
Potassium: 3.9 mEq/L (ref 3.5–5.1)
Total Bilirubin: 0.2 mg/dL — ABNORMAL LOW (ref 0.3–1.2)

## 2012-04-19 MED ORDER — PREDNISONE 10 MG PO TABS
60.0000 mg | ORAL_TABLET | Freq: Every day | ORAL | Status: DC
  Filled 2012-04-19: qty 1

## 2012-04-19 MED ORDER — INSULIN ASPART 100 UNIT/ML ~~LOC~~ SOLN
0.0000 [IU] | SUBCUTANEOUS | Status: DC
  Administered 2012-04-19 (×2): 4 [IU] via SUBCUTANEOUS
  Administered 2012-04-19: 7 [IU] via SUBCUTANEOUS
  Administered 2012-04-20: 15 [IU] via SUBCUTANEOUS
  Administered 2012-04-20: 11 [IU] via SUBCUTANEOUS
  Administered 2012-04-20: 7 [IU] via SUBCUTANEOUS
  Administered 2012-04-21: 11 [IU] via SUBCUTANEOUS
  Administered 2012-04-21 (×2): 7 [IU] via SUBCUTANEOUS
  Administered 2012-04-21 (×2): 11 [IU] via SUBCUTANEOUS
  Administered 2012-04-22 (×4): 7 [IU] via SUBCUTANEOUS
  Administered 2012-04-22: 11 [IU] via SUBCUTANEOUS
  Administered 2012-04-22: 15 [IU] via SUBCUTANEOUS
  Administered 2012-04-23: 7 [IU] via SUBCUTANEOUS
  Administered 2012-04-23 (×2): 4 [IU] via SUBCUTANEOUS
  Administered 2012-04-23: 3 [IU] via SUBCUTANEOUS
  Administered 2012-04-24: 11 [IU] via SUBCUTANEOUS
  Administered 2012-04-24: 7 [IU] via SUBCUTANEOUS
  Administered 2012-04-24: 3 [IU] via SUBCUTANEOUS
  Administered 2012-04-24: 7 [IU] via SUBCUTANEOUS
  Administered 2012-04-24: 4 [IU] via SUBCUTANEOUS
  Administered 2012-04-25: 3 [IU] via SUBCUTANEOUS
  Administered 2012-04-25: 7 [IU] via SUBCUTANEOUS
  Administered 2012-04-25: 11 [IU] via SUBCUTANEOUS
  Administered 2012-04-26: 7 [IU] via SUBCUTANEOUS
  Administered 2012-04-26: 11 [IU] via SUBCUTANEOUS
  Administered 2012-04-26 (×2): 4 [IU] via SUBCUTANEOUS
  Administered 2012-04-26: 11 [IU] via SUBCUTANEOUS

## 2012-04-19 MED ORDER — FREE WATER
200.0000 mL | Freq: Four times a day (QID) | Status: DC
  Administered 2012-04-19 – 2012-04-24 (×18): 200 mL

## 2012-04-19 NOTE — Progress Notes (Signed)
Name: [name] MRN: [id number] DOB: 1928/09/27    LOS: 15  Referring Provider:  EDP Reason for Referral:  DKA  PULMONARY / CRITICAL CARE MEDICINE    Brief patient description:  78 yo diabetic admit from home via [facility] ED with nausea, vomiting and altered mental status. Treated for DKA but course also significant for sepsis due to skin source vs PNA, NSTEMI with shock (septic + cardiogenic), resp failure.   Current Status: No distress. Failed bedside swallow this am  Vital Signs: Temp:  [96.8 F (36 C)-97.7 F (36.5 C)] 97.7 F (36.5 C) (08/16 0900) Pulse Rate:  [50-58] 51  (08/16 0900) Resp:  [11-20] 11  (08/16 0900) BP: (95-129)/(42-71) 129/54 mmHg (08/16 0900) SpO2:  [90 %-100 %] 98 % (08/16 0900) FiO2 (%):  [3 %] 3 % (08/16 0900) Weight:  [69.5 kg (153 lb 3.5 oz)] 69.5 kg (153 lb 3.5 oz) (08/16 0300)    Intake/Output Summary (Last 24 hours) at 04/19/12 1041 Last data filed at 04/19/12 1000  Gross per 24 hour  Intake    640 ml  Output   1575 ml  Net   -935 ml  3 liters  Physical Examination: General:  Chronically ill appearing,no acute distress Neuro:  Following   Commands/ alert and interactive HEENT:  PERRL, dry membranes, # 8 ETT Neck:  Supple, no JVD Cardiovascular:  RRR, tachycardic Lungs:  Diffuse crackles.  Abdomen:  Soft, non tender, bowel sounds present  Musculoskeletal:  Moves all extremities, able to lift head and sit up in bed w/ minimal assist.  Skin:  R foot ulcer, sacral decubitus ulcer  Dg Abd 1 View  04/18/2012  *RADIOLOGY REPORT*  Clinical Data: Feeding tube placement.  ABDOMEN - 1 VIEW  Comparison: 04/18/2012.  Findings: Weighted tip feeding tube is present with redundant loop in the proximal stomach.  The weighted tip is in the proximal fundus.  Bowel gas pattern is nonobstructive.  Severe left hip osteoarthritis incidentally noted.  Compared to prior, the tube has been advanced.  IMPRESSION:  Weighted tip feeding tube present in the gastric  fundus, with redundant loop in the proximal fundus.  Original Report Authenticated By: Andreas Newport, M.D.   Dg Abd 1 View  04/18/2012  *RADIOLOGY REPORT*  Clinical Data: Vomiting, feeding tube placement.  ABDOMEN - 1 VIEW  Comparison: 04/16/2012  Findings: Panda feeding tube tip extends just beyond the GE junction.  Visualized bowel gas pattern unremarkable.  The lower abdomen is excluded.  Coarse airspace opacities are noted in the visualized lung bases.  IMPRESSION:  Feeding tube tip just beyond the GE junction.  Original Report Authenticated By: Osa Craver, M.D.   Dg Chest Port 1 View  04/18/2012  *RADIOLOGY REPORT*  Clinical Data: Lung infiltrates, follow-up  PORTABLE CHEST - 1 VIEW  Comparison: Portable chest x-ray of 04/17/2012  Findings: Aeration has improved slightly.  However there is still diffuse airspace disease bilaterally.  Endotracheal tube and feeding tube remain.  No pneumothorax is seen.  Heart size is stable.  IMPRESSION: Slightly better aeration.  Persistent diffuse airspace disease.  Original Report Authenticated By: Juline Patch, M.D.  8/15 clearing bilateral diffuse airspace disease.   ASSESSMENT AND PLAN  PULMONARY   ETT:  8/2>>>8/4 ETT:  8/5>>>8/15  A:  Acute respiratory failure in setting of septic shock skin and DKA. Pulmonary infiltrates>>not much improvement with diuresis.  ESR 131 from 8/13. Treating as FP ARDS w/steroid since 8/13. Looking much better clinically. Extubated 8/15  P:   Change to pred. Will hold at 60mg /d and adjust based on clinical response Keep even fluid balance pulm hygiene s/p extubation NPO, re-eval swallowing  CARDIOVASCULAR  Lab 04/16/12 1020 04/13/12 0525  TROPONINI -- <0.30  LATICACIDVEN -- --  PROBNP 2493.0* --     Lines: CVL  L [place] 8/2>>8/14  Echo 8/02>>EF 40 to 45%, diffuse hypokinesis, grade 1 diastolic dysfx, mod RV systolic dysfx  A:   Septic shock>>resolved. NSTEMI and superimposed cardiogenic  shock>>shock resolved. Acute combined CHF with acute pulmonary edema>>improved. Cardiology signed off 8/07>>recommended medical management. P:  Restarted ASA 8/13 (held due to concern for GI bleeding) Continue low dose lopressor  RENAL  Lab 04/19/12 0323 04/18/12 0310 04/17/12 2353 04/17/12 0430 04/16/12 0340  NA 148* 149* 145 147* 144  K 3.9 3.4* -- -- --  CL 108 107 104 105 104  CO2 34* 34* 33* 33* 33*  BUN 64* 67* 68* 55* 42*  CREATININE 0.96 1.01 1.05 1.07 0.98  CALCIUM 8.3* 8.6 8.6 8.3* 8.2*  MG -- -- -- -- --  PHOS -- -- -- -- --   Intake/Output      08/15 0701 - 08/16 0700 08/16 0701 - 08/17 0700   I.V. (mL/kg) 240 (3.5) 30 (0.4)   NG/GT 600    IV Piggyback     Total Intake(mL/kg) 840 (12.1) 30 (0.4)   Urine (mL/kg/hr) 1500 (0.9) 200   Total Output 1500 200   Net -660 -170         Foley:  8/1 >>>  A:  Mild hypernatremia P:  Free water Hold diuretics  GASTROINTESTINAL  Lab 04/19/12 0323 04/17/12 2353 04/16/12 0340  AST 21 20 32  ALT 11 11 12   ALKPHOS 89 111 135*  BILITOT 0.2* 0.3 0.3  PROT 6.1 6.0 5.6*  ALBUMIN 1.6* 1.4* 1.4*   A: ileus >>resolved.   Diarrhea (C diff negative 8/12)>>improved. Nutrition, protein calorie malnutrition. Dysphagia P: Tolerating tube feeds  NPO Recheck swallow per SLP  HEMATOLOGIC  Lab 04/19/12 0323 04/18/12 0310 04/17/12 2353 04/17/12 0430 04/16/12 0340  HGB 8.2* 7.6* 7.1* 7.3* 7.9*  HCT 26.3* 23.8* 21.5* 22.2* 23.5*  PLT 329 263 242 204 148*  INR -- -- -- -- --  APTT -- -- -- -- --   A:  Anemia of critical illness, chronic disease, and iron deficiency.  Required 1u PRBC 8/9. P:  F/u CBC Added FeSO4 8/14 Transfuse for Hb < 7   INFECTIOUS  Lab 04/19/12 0323 04/18/12 0310 04/17/12 2353 04/17/12 0430 04/16/12 0340 04/15/12 0546 04/13/12 0525  WBC 22.0* 28.6* 28.6* 26.1* 19.2* -- --  PROCALCITON -- -- -- -- 0.84 0.73 0.90   Cultures: 8/1 Blood >>> negative 8/1 MRSA screen: negative    Antibiotics: Vancomycin 8/1 (sepsis , skin source)>>> 8/8 Zosyn 8/1 (sepsis, skin source)>>> 8/12  A:  Possible infected diabetic ulcer / sacral decub ulcer. Leukocytosis: PCT normal, D/C'd central line 8/14-->suspect steroids P:   Monitor off Abx   ENDOCRINE  Lab 04/19/12 0745 04/19/12 0307 04/19/12 0003 04/18/12 2039 04/18/12 1621  GLUCAP 128* 168* 190* 217* 232*   A:  DM.  DKA secondary to noncompliance (history of noncompliance).   Hypothyroidism. P:   Synthroid Lantus and ssi w/ basal dosing. Will hold off from changes as will be holding tubefeeds.  Diabetes coordinator assisting  NEUROLOGIC Head CT:  8/1 >>> nad  A:  Acute encephalopathy, metabolic and due to sepsis>>resolved. P: D/c sedation, will cont low  dose PRN analgesia as needed for dressing changes.    BEST PRACTICE / DISPOSITION Level of Care:  ICU__>medsurg Primary Service:  PCCM Consultants:  None Code Status:  Limited >> no CPR/defib Diet: TF's DVT Px:  SQ heparin GI Px:  PPI Skin Integrity:  As above - per  wound care consult Social / Family: No family at bedside  Reviewed above, examined pt, and agree with assessment/plan.  He continues to improve.  Will transition to prednisone.  Hopefully swallow will improve in next few days, and then can advance diet.  Needs more PT/OT/Rehab.  Can transfer to medical floor, unless he gets bed available at [facility].  Coralyn Helling, MD [facility] Pulmonary/Critical Care 04/19/2012, 11:11 AM Pager:  [phone] After 3pm call: [phone]

## 2012-04-19 NOTE — Evaluation (Signed)
Occupational Therapy Evaluation Patient Details Name: [name] MRN: [id number] DOB: Sep 22, 1928 Today's Date: 04/19/2012 Time: 6213-0865 OT Time Calculation (min): 31 min  OT Assessment / Plan / Recommendation Clinical Impression  This 78 year old man was admitted for nausea, vomiting, and AMS. He is presents with sepsis and is  s/p VDRF  Pt has wounds on buttocks and L ankle.  Pt presents with decreased strength, balance, and endurance and will benefit from skilled OT with min to mod A goals in acute.      OT Assessment  Patient needs continued OT Services    Follow Up Recommendations  Inpatient Rehab (vs. SNF/LTACH)    Barriers to Discharge      Equipment Recommendations  None recommended by PT;Defer to next venue    Recommendations for Other Services    Frequency  Min 2X/week    Precautions / Restrictions Precautions Precaution Comments: ulcers on R ankle and sacrum Restrictions Weight Bearing Restrictions: No   Pertinent Vitals/Pain No c/o pain    ADL  Eating/Feeding: NPO Grooming: Simulated;Set up Where Assessed - Grooming: Supported sitting Upper Body Bathing: Simulated;Set up Where Assessed - Upper Body Bathing: Supported sitting Lower Body Bathing: Simulated;+2 Total assistance Lower Body Bathing: Patient Percentage: 60% Where Assessed - Lower Body Bathing: Supported sit to stand Upper Body Dressing: Simulated;Set up Where Assessed - Upper Body Dressing: Supported sitting Lower Body Dressing: Simulated;+2 Total assistance Lower Body Dressing: Patient Percentage: 20% Where Assessed - Lower Body Dressing: Supported sit to stand Toilet Transfer: Simulated;+2 Total assistance Toilet Transfer: Patient Percentage: 50% Statistician Method: Surveyor, minerals:  (bed to chair) Toileting - Architect and Hygiene: Simulated;+2 Total assistance Toileting - Architect and Hygiene: Patient Percentage:  (25%) Where Assessed -  Toileting Clothing Manipulation and Hygiene: Sit to stand from 3-in-1 or toilet Equipment Used: Rolling walker;Gait belt ADL Comments: Pt unable to reach to feet in recliner:  will try lower surface next time.    OT Diagnosis: Generalized weakness  OT Problem List: Decreased strength;Decreased activity tolerance;Impaired balance (sitting and/or standing);Decreased knowledge of use of DME or AE;Cardiopulmonary status limiting activity OT Treatment Interventions: Self-care/ADL training;Patient/family education;Balance training;DME and/or AE instruction;Therapeutic activities   OT Goals Acute Rehab OT Goals OT Goal Formulation: With patient Time For Goal Achievement: 04/19/12 Potential to Achieve Goals: Good ADL Goals Pt Will Perform Grooming: with min assist;Standing at sink ADL Goal: Grooming - Progress: Goal set today Pt Will Perform Lower Body Bathing: with min assist;Sit to stand from chair ADL Goal: Lower Body Bathing - Progress: Goal set today Pt Will Perform Lower Body Dressing: with mod assist;Sit to stand from chair ADL Goal: Lower Body Dressing - Progress: Goal set today Pt Will Transfer to Toilet: with min assist;Ambulation;3-in-1 ADL Goal: Toilet Transfer - Progress: Goal set today Pt Will Perform Toileting - Hygiene: with min assist;Sit to stand from 3-in-1/toilet ADL Goal: Toileting - Hygiene - Progress: Goal set today  Visit Information  Last OT Received On: 04/19/12 Assistance Needed: +2    Subjective Data  Subjective: "I use my walker at home" Patient Stated Goal: agreeable to OT/PT   Prior Functioning  Vision/Perception  Home Living Lives With: Spouse Available Help at Discharge: Family Type of Home: House Home Access: Stairs to enter Secretary/administrator of Steps: 2 Entrance Stairs-Rails: Right;Left;Can reach both Home Layout: One level Bathroom Shower/Tub: Engineer, manufacturing systems: Standard Home Adaptive Equipment: Walker - rolling;Tub transfer  bench;Bedside commode/3-in-1 Prior Function Level of Independence: Independent  with assistive device(s) Able to Take Stairs?: Yes Communication Communication: No difficulties      Cognition  Overall Cognitive Status: Appears within functional limits for tasks assessed/performed Arousal/Alertness: Awake/alert Orientation Level: Appears intact for tasks assessed Behavior During Session: [facility] for tasks performed Cognition - Other Comments: oriented to month, day of week, and year    Extremity/Trunk Assessment Right Upper Extremity Assessment RUE ROM/Strength/Tone: Within functional levels Left Upper Extremity Assessment LUE ROM/Strength/Tone: Within functional levels   Mobility Bed Mobility Bed Mobility: Supine to Sit;Sitting - Scoot to Edge of Bed Supine to Sit: 4: Min assist;HOB elevated Sitting - Scoot to [place] of Bed: 4: Min assist;With rail Details for Bed Mobility Assistance: HOB at 50 degrees, Transfers Sit to Stand: 1: +2 Total assist;From bed (for safety) Sit to Stand: Patient Percentage: 60% Stand to Sit: 1: +2 Total assist Stand to Sit: Patient Percentage: 50% Details for Transfer Assistance: pt's legs gave way as pt got positioned in front of recliner and pt. sat down quickly   Exercise    Balance Static Sitting Balance Static Sitting - Balance Support: Bilateral upper extremity supported Static Sitting - Level of Assistance: 5: Stand by assistance  End of Session OT - End of Session Activity Tolerance: Patient tolerated treatment well Patient left: in chair;with call bell/phone within reach Nurse Communication: Mobility status  GO     [name] 04/19/2012, 2:31 PM Marica Otter, OTR/L [phone] 04/19/2012

## 2012-04-19 NOTE — Evaluation (Signed)
Physical Therapy Evaluation Patient Details Name: [name] MRN: [id number] DOB: Jan 03, 1929 Today's Date: 04/19/2012 Time: 0102-7253 PT Time Calculation (min): 33 min  PT Assessment / Plan / Recommendation Clinical Impression  Pt was admitted w/ AMS, sepsis, VDRF. Pt. lived at home w/ wife PTA. Pt reports being ambulatory. Pt will benefit from PT to improve in functional mobility and strength to discharge to next venue. Recommend CIR consult.    PT Assessment  Patient needs continued PT services    Follow Up Recommendations  Inpatient Rehab    Barriers to Discharge        Equipment Recommendations  None recommended by PT    Recommendations for Other Services OT consult   Frequency Min 3X/week    Precautions / Restrictions Precautions Precaution Comments: ulcers on R ankle and sacrum   Pertinent Vitals/Pain       Mobility  Bed Mobility Bed Mobility: Supine to Sit;Sitting - Scoot to Edge of Bed Supine to Sit: 4: Min assist;HOB elevated Sitting - Scoot to [place] of Bed: 4: Min assist;With rail Details for Bed Mobility Assistance: HOB at 50 degrees, Transfers Transfers: Sit to Stand;Stand to [employer] Transfers Sit to Stand: 1: +2 Total assist;From bed (for safety) Sit to Stand: Patient Percentage: 60% Stand to Sit: 1: +2 Total assist Stand to Sit: Patient Percentage: 50% Stand Pivot Transfers: 1: +2 Total assist Stand Pivot Transfers: Patient Percentage: 60% Details for Transfer Assistance: pt's legs gave way as pt got positioned in front of recliner and pt. sat down quickly    Exercises     PT Diagnosis: Difficulty walking;Generalized weakness  PT Problem List: Decreased strength;Decreased activity tolerance;Decreased balance;Decreased skin integrity;Decreased knowledge of use of DME;Decreased mobility PT Treatment Interventions: DME instruction;Gait training;Functional mobility training;Therapeutic activities;Patient/family education   PT Goals Acute Rehab  PT Goals PT Goal Formulation: With patient Time For Goal Achievement: 05/03/12 Potential to Achieve Goals: Good Pt will go Supine/Side to Sit: with supervision;with HOB 0 degrees PT Goal: Supine/Side to Sit - Progress: Goal set today Pt will go Sit to Supine/Side: with supervision;with HOB 0 degrees PT Goal: Sit to Supine/Side - Progress: Goal set today Pt will go Sit to Stand: with supervision PT Goal: Sit to Stand - Progress: Goal set today Pt will go Stand to Sit: with supervision PT Goal: Stand to Sit - Progress: Goal set today Pt will Ambulate: 51 - 150 feet;with rolling walker;with supervision PT Goal: Ambulate - Progress: Goal set today  Visit Information  Assistance Needed: +2    Subjective Data  Subjective: it feels good to get up. I feel wobbly Patient Stated Goal: to return home   Prior Functioning  Home Living Lives With: Spouse Available Help at Discharge: Family Type of Home: House Home Access: Stairs to enter Secretary/administrator of Steps: 2 Entrance Stairs-Rails: Right;Left;Can reach both Home Layout: One level Bathroom Shower/Tub: Tub/shower unit Home Adaptive Equipment: Walker - rolling Prior Function Level of Independence: Independent with assistive device(s) Able to Take Stairs?: Yes    Cognition  Overall Cognitive Status: Appears within functional limits for tasks assessed/performed Arousal/Alertness: Awake/alert Orientation Level: Appears intact for tasks assessed Behavior During Session: [facility] for tasks performed    Extremity/Trunk Assessment Right Lower Extremity Assessment RLE ROM/Strength/Tone: WFL for tasks assessed RLE Sensation: WFL - Light Touch Left Lower Extremity Assessment LLE ROM/Strength/Tone: WFL for tasks assessed LLE Sensation: WFL - Light Touch   Balance Static Sitting Balance Static Sitting - Balance Support: Bilateral upper extremity supported Static Sitting -  Level of Assistance: 5: Stand by assistance  End of Session PT -  End of Session Activity Tolerance: Patient limited by fatigue Patient left: in chair;with call bell/phone within reach Nurse Communication: Mobility status  GP     Rada Hay 04/19/2012, 12:29 PM  [phone]

## 2012-04-19 NOTE — Clinical Social Work Note (Signed)
CSW discussed case with RN CM and plan is for [facility]. CSW will continue to follow in case plans change and Pt needs SNF.   Doreen Salvage, LCSWA Clinical Social Worker [facility] Cell [phone]

## 2012-04-19 NOTE — Progress Notes (Signed)
Speech Language Pathology Dysphagia Treatment Patient Details Name: Maury Groninger MRN: [id number] DOB: 1929/04/28 Today's Date: 04/19/2012 Time: 1027-2536 SLP Time Calculation (min): 24 min  Assessment / Plan / Recommendation Clinical Impression  Pt continues with s/s of aspiration with pureed/nectar and thin water, delayed swallow continues - suspect cognitive component as well .  ? Asp prior to swallow due to premature spillage of bolus into trachea and possibly asp after due to weakness resulting stasis.  Overt weak cough that was not productive observed.  Pt acknowledges voice is not at baseline and his cough is not strong.  ? residual edema due to intubation. Spoke to CCS NP, plan to keep npo with oral care and reeval on Monday.   Suspect swallow to improve within a few days to hopefully allow even a modified diet.     Diet Recommendation  Continue with Current Diet: NPO    SLP Plan     Pertinent Vitals/Pain Afebrile, decreased lungs, weak phonation   Swallowing Goals  SLP Swallowing Goals Goal #3: Pt will swallow nectar thick liquids within 3 seconds without s/s of aspiration observed at bedside to aid in determining readiness for po/instrumental testing.   General Temperature Spikes Noted: No Respiratory Status: Supplemental O2 delivered via (comment) Behavior/Cognition: Alert;Cooperative Oral Cavity - Dentition: Edentulous;Poor condition;Missing dentition Patient Positioning: Upright in bed  Oral Cavity - Oral Hygiene Does patient have any of the following "at risk" factors?: Oxygen therapy - cannula, mask, simple oxygen devices Patient is AT RISK - Oral Care Protocol followed (see row info): Yes   Dysphagia Treatment Treatment focused on: Upgraded PO texture trials Treatment Methods/Modalities: Skilled observation;Differential diagnosis Patient observed directly with PO's: Yes Type of PO's observed: Nectar-thick liquids;Thin liquids;Dysphagia 1 (puree) Feeding: Able to  feed self Liquids provided via: Teaspoon;Cup;Straw Oral Phase Signs & Symptoms: Prolonged bolus formation;Prolonged oral phase Pharyngeal Phase Signs & Symptoms: Suspected delayed swallow initiation;Immediate cough;Delayed cough;Complaints of residue Type of cueing: Verbal Amount of cueing: Maximal   GO    Donavan Burnet, MS [facility] SLP [phone]

## 2012-04-20 ENCOUNTER — Inpatient Hospital Stay (HOSPITAL_COMMUNITY): Payer: Medicare Other

## 2012-04-20 DIAGNOSIS — R918 Other nonspecific abnormal finding of lung field: Secondary | ICD-10-CM

## 2012-04-20 LAB — GLUCOSE, CAPILLARY
Glucose-Capillary: 83 mg/dL (ref 70–99)
Glucose-Capillary: 108 mg/dL — ABNORMAL HIGH (ref 70–99)
Glucose-Capillary: 36 mg/dL — CL (ref 70–99)
Glucose-Capillary: 78 mg/dL (ref 70–99)
Glucose-Capillary: 165 mg/dL — ABNORMAL HIGH (ref 70–99)
Glucose-Capillary: 261 mg/dL — ABNORMAL HIGH (ref 70–99)

## 2012-04-20 LAB — BLOOD GAS, ARTERIAL
TCO2: 30.1 mmol/L (ref 0–100)
pCO2 arterial: 57.1 mmHg (ref 35.0–45.0)
pH, Arterial: 7.366 (ref 7.350–7.450)

## 2012-04-20 LAB — COMPREHENSIVE METABOLIC PANEL
ALT: 14 U/L (ref 0–53)
AST: 25 U/L (ref 0–37)
Albumin: 1.6 g/dL — ABNORMAL LOW (ref 3.5–5.2)
CO2: 33 mEq/L — ABNORMAL HIGH (ref 19–32)
Calcium: 8.5 mg/dL (ref 8.4–10.5)
GFR calc non Af Amer: 77 mL/min — ABNORMAL LOW (ref >90)
Sodium: 149 mEq/L — ABNORMAL HIGH (ref 135–145)
Total Protein: 6.4 g/dL (ref 6.0–8.3)

## 2012-04-20 LAB — CBC
HCT: 33.2 % — ABNORMAL LOW (ref 39.0–52.0)
MCHC: 31.3 g/dL (ref 30.0–36.0)
Platelets: 338 10*3/uL (ref 150–400)
RDW: 15.8 % — ABNORMAL HIGH (ref 11.5–15.5)
WBC: 19.3 10*3/uL — ABNORMAL HIGH (ref 4.0–10.5)

## 2012-04-20 MED ORDER — METHYLPREDNISOLONE SODIUM SUCC 40 MG IJ SOLR
40.0000 mg | Freq: Three times a day (TID) | INTRAMUSCULAR | Status: DC
  Administered 2012-04-20 – 2012-04-22 (×6): 40 mg via INTRAVENOUS
  Filled 2012-04-20 (×9): qty 1

## 2012-04-20 MED ORDER — DEXTROSE 50 % IV SOLN
50.0000 mL | Freq: Once | INTRAVENOUS | Status: AC | PRN
  Administered 2012-04-20: 50 mL via INTRAVENOUS

## 2012-04-20 MED ORDER — PANTOPRAZOLE SODIUM 40 MG IV SOLR
40.0000 mg | INTRAVENOUS | Status: DC
  Administered 2012-04-20 – 2012-04-21 (×2): 40 mg via INTRAVENOUS
  Filled 2012-04-20 (×3): qty 40

## 2012-04-20 MED ORDER — KCL IN DEXTROSE-NACL 20-5-0.45 MEQ/L-%-% IV SOLN
INTRAVENOUS | Status: DC
  Administered 2012-04-20 – 2012-04-21 (×2): via INTRAVENOUS
  Filled 2012-04-20 (×2): qty 1000

## 2012-04-20 MED ORDER — METOPROLOL TARTRATE 1 MG/ML IV SOLN
2.5000 mg | Freq: Three times a day (TID) | INTRAVENOUS | Status: DC
  Administered 2012-04-20 – 2012-04-21 (×2): 2.5 mg via INTRAVENOUS
  Filled 2012-04-20 (×6): qty 5

## 2012-04-20 MED ORDER — LEVOTHYROXINE SODIUM 100 MCG IV SOLR
37.5000 ug | Freq: Every day | INTRAVENOUS | Status: DC
  Administered 2012-04-20: 38 ug via INTRAVENOUS
  Filled 2012-04-20 (×2): qty 1.9

## 2012-04-20 MED ORDER — FUROSEMIDE 10 MG/ML IJ SOLN
40.0000 mg | Freq: Once | INTRAMUSCULAR | Status: AC
  Administered 2012-04-20: 40 mg via INTRAVENOUS
  Filled 2012-04-20: qty 4

## 2012-04-20 MED ORDER — DEXTROSE 50 % IV SOLN
INTRAVENOUS | Status: AC
  Filled 2012-04-20: qty 50

## 2012-04-20 NOTE — Progress Notes (Addendum)
Name: [name] MRN: [id number] DOB: 11-27-1928    LOS: 16  Referring Provider:  EDP Reason for Referral:  DKA  PULMONARY / CRITICAL CARE MEDICINE    Brief patient description:  78 yo diabetic admit from home via [facility] ED with nausea, vomiting and altered mental status. Treated for DKA but course also significant for sepsis due to skin source vs PNA, NSTEMI with shock (septic + cardiogenic), resp failure.   Events: 8/17 Transfer back to ICU for hypoxia>>?displacement of panda and aspiration of tube feeds  Current Status: Less alert.  On NRB mask.  Vital Signs: Temp:  [97.4 F (36.3 C)-98.6 F (37 C)] 98.6 F (37 C) (08/17 0530) Pulse Rate:  [51-79] 70  (08/17 0700) Resp:  [11-27] 24  (08/17 0700) BP: (124-146)/(50-68) 134/58 mmHg (08/17 0700) SpO2:  [62 %-100 %] 95 % (08/17 0700) FiO2 (%):  [3 %] 3 % (08/16 1847)    Intake/Output Summary (Last 24 hours) at 04/20/12 0838 Last data filed at 04/19/12 1800  Gross per 24 hour  Intake    510 ml  Output    900 ml  Net   -390 ml  NRB  Physical Examination: General: Ill appearing Neuro: Somnolent, easily arousable, follows commands HEENT: No sinus tenderness Neck:  Supple Cardiovascular: s1s2 regular, no murmur Lungs: Scattered rhonchi, no wheeze Abdomen:  Soft, non tender, bowel sounds present  Musculoskeletal: 1+ edema Skin:  R foot ulcer, sacral decubitus ulcer  Dg Chest Port 1 View  04/20/2012  *RADIOLOGY REPORT*  Clinical Data: Follow up airspace disease.  PORTABLE CHEST - 1 VIEW  Comparison: 08/15/ 2013  Findings: Single view of the chest demonstrates bilateral diffuse airspace disease.  There is concern for increased airspace disease in the right lung.  Heart size is stable.  The endotracheal tube and feeding tube have been removed.  Negative for a pneumothorax.  IMPRESSION: Persistent bilateral airspace disease.  Concern for increased airspace disease in the right lung.  Original Report Authenticated By: Richarda Overlie,  M.D.   Dg Chest Port 1 View  04/20/2012  *RADIOLOGY REPORT*  Clinical Data: Evaluate lung fields.  Concern for aspiration.  PORTABLE CHEST - 1 VIEW  Comparison: 04/20/2012 and 04/18/2012  Findings: Single view of the chest again demonstrates diffuse bilateral airspace disease, right side slightly worse than left. Heart size is within normal limits and stable.  The trachea is midline.  No evidence for a pneumothorax.  Mild consolidation in the retrocardiac space.  IMPRESSION: Persistent bilateral airspace disease.  There appears to be increased airspace disease in the right lung compared to 04/18/2012.  Original Report Authenticated By: Richarda Overlie, M.D.    ASSESSMENT AND PLAN  PULMONARY  ABG    Component Value Date/Time   PHART 7.366 04/20/2012 0624   PCO2ART 57.1* 04/20/2012 0624   PO2ART 67.5* 04/20/2012 0624   HCO3 31.9* 04/20/2012 0624   TCO2 30.1 04/20/2012 0624   ACIDBASEDEF 3.5* 04/09/2012 0400   O2SAT 90.7 04/20/2012 0624   ETT:  8/2>>>8/4 ETT:  8/5>>>8/15  A:  Acute respiratory failure in setting of septic shock skin and DKA.  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  Worsening hypoxia 8/17 with ?aspiration of tube feeds. P:   Change back to solumedrol until panda replaced F/u CXR Titrate oxygen to keep SpO2 > 90% BPAP qhs and prn>>hopefully can avoid re-intubation Give lasix 40 mg IV x one 8/17  CARDIOVASCULAR  Lab 04/16/12 1020  TROPONINI --  LATICACIDVEN --  PROBNP 2493.0*     Lines: CVL  L [place] 8/2>>8/14  Echo 8/02>>EF 40 to 45%, diffuse hypokinesis, grade 1 diastolic dysfx, mod RV systolic dysfx  A:  Septic shock>>resolved. NSTEMI and superimposed cardiogenic shock>>shock resolved. Acute combined CHF with acute pulmonary edema>>improved. Cardiology signed off 8/07>>recommended medical management. P:  Restarted ASA 8/13 (held due to concern for GI bleeding) Continue low dose  lopressor>>change to IV until panda replaced  RENAL  Lab 04/19/12 0323 04/18/12 0310 04/17/12 2353 04/17/12 0430 04/16/12 0340  NA 148* 149* 145 147* 144  K 3.9 3.4* -- -- --  CL 108 107 104 105 104  CO2 34* 34* 33* 33* 33*  BUN 64* 67* 68* 55* 42*  CREATININE 0.96 1.01 1.05 1.07 0.98  CALCIUM 8.3* 8.6 8.6 8.3* 8.2*  MG -- -- -- -- --  PHOS -- -- -- -- --   Intake/Output      08/16 0701 - 08/17 0700 08/17 0701 - 08/18 0700   I.V. (mL/kg) 110 (1.6)    NG/GT 410    Total Intake(mL/kg) 520 (7.5)    Urine (mL/kg/hr) 900 (0.5)    Total Output 900    Net -380          Foley:  8/1 >>>  A:  Mild hypernatremia P:  F/u BMET while getting lasix  GASTROINTESTINAL  Lab 04/19/12 0323 04/17/12 2353 04/16/12 0340  AST 21 20 32  ALT 11 11 12   ALKPHOS 89 111 135*  BILITOT 0.2* 0.3 0.3  PROT 6.1 6.0 5.6*  ALBUMIN 1.6* 1.4* 1.4*   A: ileus >>resolved.   Diarrhea (C diff negative 8/12)>>improved. Nutrition, protein calorie malnutrition. Dysphagia P: Replace panda tube  NPO Recheck swallow per SLP  HEMATOLOGIC  Lab 04/20/12 0747 04/19/12 0323 04/18/12 0310 04/17/12 2353 04/17/12 0430  HGB 10.4* 8.2* 7.6* 7.1* 7.3*  HCT 33.2* 26.3* 23.8* 21.5* 22.2*  PLT 338 329 263 242 204  INR -- -- -- -- --  APTT -- -- -- -- --   A:  Anemia of critical illness, chronic disease, and iron deficiency.  Required 1u PRBC 8/9. P:  F/u CBC Added FeSO4 8/14 Transfuse for Hb < 7   INFECTIOUS  Lab 04/20/12 0747 04/19/12 0323 04/18/12 0310 04/17/12 2353 04/17/12 0430 04/16/12 0340 04/15/12 0546  WBC 19.3* 22.0* 28.6* 28.6* 26.1* -- --  PROCALCITON -- -- -- -- -- 0.84 0.73   Cultures: 8/1 Blood >>> negative 8/1 MRSA screen: negative   Antibiotics: Vancomycin 8/1 (sepsis , skin source)>>> 8/8 Zosyn 8/1 (sepsis, skin source)>>> 8/12  A:  Possible infected diabetic ulcer / sacral decub ulcer.  Possible aspiration tube feeds 8/17. P:   Low threshold to add Abx for aspiration PNA if he  spikes fever   ENDOCRINE  Lab 04/20/12 0408 04/19/12 2350 04/19/12 2035 04/19/12 1609 04/19/12 1227  GLUCAP 83 203* 173* 211* 186*   A:  DM.  DKA secondary to noncompliance (history of noncompliance).   Hypothyroidism. Hypoglycemic event 8/17. P:   Synthroid Add D5 1/2 NS IV fluid until able to resume tube feeds Lantus and ssi  Hold basal insulin dosing for now Diabetes coordinator assisting  NEUROLOGIC  Head CT:  8/1 >>> nad  A:  Acute encephalopathy, metabolic and due to sepsis>>resolved. P: PRN analgesia   BEST PRACTICE / DISPOSITION Level of Care:  ICU Primary Service:  PCCM Consultants:  None Code Status:  Limited >> no CPR/defib Diet: NPO DVT Px:  SQ heparin GI Px:  PPI Skin Integrity:  As above - per  wound care consult Social / Family: No family at bedside  He was being evaluated for transfer to [facility].  Hold for now until sure respiratory status improved.  Coralyn Helling, MD [facility] Pulmonary/Critical Care 04/20/2012, 8:38 AM Pager:  [phone] After 3pm call: [phone]

## 2012-04-20 NOTE — Progress Notes (Signed)
Hypoglycemic Event  CBG: 36  Treatment: D50/66ml  Symptoms:  Lethargic  Follow-up CBG: Time: 0802 CBG Result:  108  Possible Reasons for Event:  RT call. Panda displaced.  Tube feeding was stopped.  Comments/MD notified: Dr. Craige Cotta, PCCM    Roney Jaffe Karriker  Remember to initiate Hypoglycemia Order Set & complete

## 2012-04-20 NOTE — Significant Event (Addendum)
Rapid Response Event Note  Overview: Time Called: 0534 Arrival Time: 0538 Event Type: Respiratory  Initial Focused Assessment: See summary  Interventions: See summary  Event Summary: Name of Physician Notified: Dr Deterding (Dr deterding called and updated on pt status, ) at 0545  Called to room 1516 per [facility] RN . Per  Tola RN pt with low po2. Upon my arrival, 5 [place] monitor reading 63% with pt on 3 L [place]. Rn at bedside encouraging pt to do [employer]. Pt found lethargic but arousable, nods appropriately. Lungs diminished x 4 lobes with crackles heard in bilat lower lobes. 100% NRB placed on pt at 15 LNC, pt placed on RRT monitor yielding po2 62%, Respiratory therapy called to room.  Pt panda tube found hanging with tip at pt right nare, tube feed infusing at 19ml/hr. Tube feed stopped, panda removed. Dr Deterding called at [facility] [phone] to notify of pt status, per MD to bring pt down to unit stat then get ABG. Per Respiratory Therapy at bedside 0548, nasal suctioning done for suspected aspiration per Tabitha RT, minimal tan, liquid suctioned via NT suctioning, appeared to be tube feed. Pt provided 02 with bag valve mask per Respiratory therapist, with increased 02 sat slowly. 0600 pt o2 sat 94, placed on NRB and transferred to room 1225. Report provided from Tola RN to myself. Pt settled in ICU bed with VSS, 02 sats mid 90s upon arrival, pt lethargic still nods appropriately and follows commands. Elink notified message left with [facility] of pt arrival, ABG and CXR done stat upon admit to ICU.  Outcome: Transferred (Comment) (Pt transferred to ICU arrived 0610)     Lolita Rieger Lillianah Swartzentruber

## 2012-04-20 NOTE — Progress Notes (Signed)
Patient had decreased oxygenation from 88-90%, desats to 70's rapid response was called to room, patient was moved to ICU.

## 2012-04-21 ENCOUNTER — Inpatient Hospital Stay (HOSPITAL_COMMUNITY): Payer: Medicare Other

## 2012-04-21 DIAGNOSIS — E870 Hyperosmolality and hypernatremia: Secondary | ICD-10-CM

## 2012-04-21 LAB — BASIC METABOLIC PANEL
BUN: 51 mg/dL — ABNORMAL HIGH (ref 6–23)
CO2: 35 mEq/L — ABNORMAL HIGH (ref 19–32)
Calcium: 8.4 mg/dL (ref 8.4–10.5)
Chloride: 107 mEq/L (ref 96–112)
Creatinine, Ser: 0.84 mg/dL (ref 0.50–1.35)
Glucose, Bld: 275 mg/dL — ABNORMAL HIGH (ref 70–99)

## 2012-04-21 LAB — CBC
HCT: 29.4 % — ABNORMAL LOW (ref 39.0–52.0)
MCH: 32.4 pg (ref 26.0–34.0)
MCV: 104.6 fL — ABNORMAL HIGH (ref 78.0–100.0)
Platelets: 431 10*3/uL — ABNORMAL HIGH (ref 150–400)
RDW: 15.6 % — ABNORMAL HIGH (ref 11.5–15.5)

## 2012-04-21 LAB — GLUCOSE, CAPILLARY
Glucose-Capillary: 217 mg/dL — ABNORMAL HIGH (ref 70–99)
Glucose-Capillary: 236 mg/dL — ABNORMAL HIGH (ref 70–99)
Glucose-Capillary: 238 mg/dL — ABNORMAL HIGH (ref 70–99)
Glucose-Capillary: 270 mg/dL — ABNORMAL HIGH (ref 70–99)
Glucose-Capillary: 275 mg/dL — ABNORMAL HIGH (ref 70–99)
Glucose-Capillary: 291 mg/dL — ABNORMAL HIGH (ref 70–99)
Glucose-Capillary: 310 mg/dL — ABNORMAL HIGH (ref 70–99)

## 2012-04-21 MED ORDER — METOPROLOL TARTRATE 25 MG/10 ML ORAL SUSPENSION
12.5000 mg | Freq: Two times a day (BID) | ORAL | Status: DC
  Administered 2012-04-21 – 2012-04-26 (×11): 12.5 mg
  Filled 2012-04-21 (×13): qty 5

## 2012-04-21 MED ORDER — FAMOTIDINE 40 MG/5ML PO SUSR
20.0000 mg | Freq: Two times a day (BID) | ORAL | Status: DC
  Filled 2012-04-21: qty 2.5

## 2012-04-21 MED ORDER — RANITIDINE HCL 150 MG/10ML PO SYRP
150.0000 mg | ORAL_SOLUTION | Freq: Two times a day (BID) | ORAL | Status: DC
  Administered 2012-04-21 – 2012-04-26 (×11): 150 mg via ORAL
  Filled 2012-04-21 (×12): qty 10

## 2012-04-21 MED ORDER — LEVOTHYROXINE SODIUM 75 MCG PO TABS
75.0000 ug | ORAL_TABLET | Freq: Every day | ORAL | Status: DC
  Filled 2012-04-21: qty 1

## 2012-04-21 MED ORDER — LEVOTHYROXINE SODIUM 75 MCG PO TABS
75.0000 ug | ORAL_TABLET | Freq: Every day | ORAL | Status: DC
  Administered 2012-04-21 – 2012-04-26 (×5): 75 ug
  Filled 2012-04-21 (×7): qty 1

## 2012-04-21 MED ORDER — SODIUM CHLORIDE 0.45 % IV SOLN
INTRAVENOUS | Status: DC
  Administered 2012-04-21: 20 mL/h via INTRAVENOUS
  Administered 2012-04-26: 03:00:00 via INTRAVENOUS

## 2012-04-21 NOTE — Progress Notes (Signed)
Name: Kortney Schoenfelder MRN: [id number] DOB: 1929/03/14    LOS: 17  Referring Provider:  EDP Reason for Referral:  DKA  PULMONARY / CRITICAL CARE MEDICINE    Brief patient description:  78 yo diabetic admit from home via [facility] ED with nausea, vomiting and altered mental status. Treated for DKA but course also significant for sepsis due to skin source vs PNA, NSTEMI with shock (septic + cardiogenic), resp failure.   Events: 8/17 Transfer back to ICU for hypoxia>>?displacement of panda and aspiration of tube feeds  Current Status: Looks much better.  Breathing improved.  More alert.  Sitting in chair.  Vital Signs: Temp:  [94.6 F (34.8 C)-97.5 F (36.4 C)] 97.5 F (36.4 C) (08/18 0800) Pulse Rate:  [57-70] 67  (08/18 0800) Resp:  [14-20] 18  (08/18 0800) BP: (109-127)/(44-60) 122/55 mmHg (08/18 0800) SpO2:  [93 %-98 %] 97 % (08/18 0800) FiO2 (%):  [50 %] 50 % (08/18 0800) Weight:  [164 lb 10.9 oz (74.7 kg)] 164 lb 10.9 oz (74.7 kg) (08/18 0622)    Intake/Output Summary (Last 24 hours) at 04/21/12 0910 Last data filed at 04/21/12 0800  Gross per 24 hour  Intake   2539 ml  Output   2325 ml  Net    214 ml  NRB  Physical Examination: General: No distress Neuro: Alert, interactive HEENT: No sinus tenderness Neck:  Supple Cardiovascular: s1s2 regular, no murmur Lungs: Scattered rhonchi, no wheeze Abdomen:  Soft, non tender, bowel sounds present  Musculoskeletal: no edema Skin:  R foot ulcer, sacral decubitus ulcer  Dg Abd 1 View  04/20/2012  *RADIOLOGY REPORT*  Clinical Data: Feeding tube placement  ABDOMEN - 1 VIEW  Comparison: April 18, 2012  Findings:  Feeding tube tip is in the region of the body of the stomach.  Bowel gas pattern is unremarkable.  No free air seen on this supine examination.  Original Report Authenticated By: Arvin Collard. Gaspar Garbe, M.D.   Dg Chest Port 1 View  04/21/2012  *RADIOLOGY REPORT*  Clinical Data: Follow up infiltrates  PORTABLE CHEST - 1  VIEW  Comparison: Yesterday  Findings: Diffuse airspace disease slightly improved.  Feeding tube placed beyond the gastroesophageal junction.  No pneumothorax. Normal heart size.  IMPRESSION: Slightly improved bilateral airspace disease.  Feeding tube placed.  Original Report Authenticated By: Donavan Burnet, M.D.   Dg Chest Port 1 View  04/20/2012  *RADIOLOGY REPORT*  Clinical Data: Follow up airspace disease.  PORTABLE CHEST - 1 VIEW  Comparison: 08/15/ 2013  Findings: Single view of the chest demonstrates bilateral diffuse airspace disease.  There is concern for increased airspace disease in the right lung.  Heart size is stable.  The endotracheal tube and feeding tube have been removed.  Negative for a pneumothorax.  IMPRESSION: Persistent bilateral airspace disease.  Concern for increased airspace disease in the right lung.  Original Report Authenticated By: Richarda Overlie, M.D.   Dg Chest Port 1 View  04/20/2012  *RADIOLOGY REPORT*  Clinical Data: Evaluate lung fields.  Concern for aspiration.  PORTABLE CHEST - 1 VIEW  Comparison: 04/20/2012 and 04/18/2012  Findings: Single view of the chest again demonstrates diffuse bilateral airspace disease, right side slightly worse than left. Heart size is within normal limits and stable.  The trachea is midline.  No evidence for a pneumothorax.  Mild consolidation in the retrocardiac space.  IMPRESSION: Persistent bilateral airspace disease.  There appears to be increased airspace disease in the right lung compared to  04/18/2012.  Original Report Authenticated By: Richarda Overlie, M.D.    ASSESSMENT AND PLAN  PULMONARY  ABG    Component Value Date/Time   PHART 7.366 04/20/2012 0624   PCO2ART 57.1* 04/20/2012 0624   PO2ART 67.5* 04/20/2012 0624   HCO3 31.9* 04/20/2012 0624   TCO2 30.1 04/20/2012 0624   ACIDBASEDEF 3.5* 04/09/2012 0400   O2SAT 90.7 04/20/2012 0624   ETT:  8/2>>>8/4 ETT:  8/5>>>8/15  A:  Acute respiratory failure in setting of septic shock skin and  DKA.  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  Worsening hypoxia 8/17 with ?aspiration of tube feeds>>improved 8/18. P:   Continue IV solumedrol F/u CXR Titrate oxygen to keep SpO2 > 90% D/c BPAP Keep in even fluid balance  CARDIOVASCULAR  Lab 04/16/12 1020  TROPONINI --  LATICACIDVEN --  PROBNP 2493.0*     Lines: CVL  L [place] 8/2>>8/14  Echo 8/02>>EF 40 to 45%, diffuse hypokinesis, grade 1 diastolic dysfx, mod RV systolic dysfx  A:  Septic shock>>resolved. NSTEMI and superimposed cardiogenic shock>>shock resolved. Acute combined CHF with acute pulmonary edema>>improved. Cardiology signed off 8/07>>recommended medical management. P:  Restarted ASA 8/13 (held due to concern for GI bleeding) Continue lopressor  RENAL  Lab 04/21/12 0401 04/20/12 0851 04/19/12 0323 04/18/12 0310 04/17/12 2353  NA 147* 149* 148* 149* 145  K 4.5 3.2* -- -- --  CL 107 109 108 107 104  CO2 35* 33* 34* 34* 33*  BUN 51* 52* 64* 67* 68*  CREATININE 0.84 0.92 0.96 1.01 1.05  CALCIUM 8.4 8.5 8.3* 8.6 8.6  MG -- -- -- -- --  PHOS -- -- -- -- --   Intake/Output      08/17 0701 - 08/18 0700 08/18 0701 - 08/19 0700   I.V. (mL/kg) 1000 (13.4) 50 (0.7)   NG/GT 1440 45   IV Piggyback 4    Total Intake(mL/kg) 2444 (32.7) 95 (1.3)   Urine (mL/kg/hr) 2315 (1.3) 110   Total Output 2315 110   Net +129 -15         Foley:  8/1 >>>  A:  Mild hypernatremia P:  F/u BMET  GASTROINTESTINAL  Lab 04/20/12 0851 04/19/12 0323 04/17/12 2353 04/16/12 0340  AST 25 21 20  32  ALT 14 11 11 12   ALKPHOS 92 89 111 135*  BILITOT 0.2* 0.2* 0.3 0.3  PROT 6.4 6.1 6.0 5.6*  ALBUMIN 1.6* 1.6* 1.4* 1.4*   A: ileus >>resolved.   Diarrhea (C diff negative 8/12)>>improved. Nutrition, protein calorie malnutrition. Dysphagia P: Tube feeds via panda tube  NPO Recheck swallow per SLP  HEMATOLOGIC  Lab 04/21/12 0401 04/20/12 0747  04/19/12 0323 04/18/12 0310 04/17/12 2353  HGB 9.1* 10.4* 8.2* 7.6* 7.1*  HCT 29.4* 33.2* 26.3* 23.8* 21.5*  PLT 431* 338 329 263 242  INR -- -- -- -- --  APTT -- -- -- -- --   A:  Anemia of critical illness, chronic disease, and iron deficiency.  Required 1u PRBC 8/9. P:  F/u CBC intermittently Added FeSO4 8/14 Transfuse for Hb < 7   INFECTIOUS  Lab 04/21/12 0401 04/20/12 0747 04/19/12 0323 04/18/12 0310 04/17/12 2353 04/16/12 0340 04/15/12 0546  WBC 23.9* 19.3* 22.0* 28.6* 28.6* -- --  PROCALCITON -- -- -- -- -- 0.84 0.73   Cultures: 8/1 Blood >>> negative 8/1 MRSA screen: negative   Antibiotics: Vancomycin 8/1 (sepsis , skin source)>>> 8/8 Zosyn 8/1 (sepsis, skin source)>>> 8/12  A:  Possible infected diabetic ulcer / sacral decub ulcer.  Possible aspiration tube feeds 8/17>>no evidence for infection. Leukocytosis>>likely from steroids. P:   F/U CBC Monitor off Abx   ENDOCRINE  Lab 04/21/12 0813 04/21/12 0326 04/20/12 2346 04/20/12 1959 04/20/12 1547  GLUCAP 238* 236* 310* 261* 165*   A:  DM.  DKA secondary to noncompliance (history of noncompliance).   Hypothyroidism. Hypoglycemic event 8/17. P:   Synthroid D/c Dextrose from IV fluid now that he is on tube feeds again Lantus and ssi  Diabetes coordinator assisting  NEUROLOGIC  Head CT:  8/1 >>> nad  A:  Acute encephalopathy, metabolic and due to sepsis>>resolved. P: PRN analgesia   BEST PRACTICE / DISPOSITION Level of Care:  SDU Primary Service:  PCCM Consultants:  None Code Status:  Limited >> no CPR/defib Diet: Tube feeds DVT Px:  SQ heparin GI Px:  PPI Skin Integrity:  As above - per  wound care consult Social / Family: No family at bedside  He was being evaluated for transfer to [facility].  Hold for now until sure respiratory status improved>>may be ready 8/19.  Coralyn Helling, MD [facility] Pulmonary/Critical Care 04/21/2012, 9:10 AM Pager:  [phone] After 3pm call: [phone]

## 2012-04-21 NOTE — Progress Notes (Signed)
eLink Physician-Brief Progress Note Patient Name: [name] DOB: 04-24-1929 MRN: [id number]  Date of Service  04/21/2012   HPI/Events of Note   Need iv  eICU Interventions  kep piv for now, re veal in am       Nelda Bucks. 04/21/2012, 11:37 PM

## 2012-04-21 NOTE — Progress Notes (Signed)
Attempt PIV restart.  Poor veins.  Assessed with ultrasound.  Pt has no suitable veins for IV insertion.

## 2012-04-21 NOTE — Plan of Care (Signed)
Problem: Consults Goal: Respiratory Problems Patient Education See Patient Education Module for education specifics. Outcome: Progressing Education:  Clearing throat.  Cough deep breathing.  Incentive spirometry. Goal: Skin Care Protocol Initiated - if indicated If consults are not indicated, leave blank or document N/A Outcome: Progressing Receiving wound care. Goal: Nutrition Consult-if indicated Outcome: Progressing Tube feedings. Goal: Diabetes Guidelines if Diabetic/Glucose > 140 If diabetic or lab glucose is > 140 mg/dl - Initiate Diabetes/Hyperglycemia Guidelines & Document Interventions  Outcome: Progressing Sliding scale.  Problem: ICU Phase Progression Outcomes Goal: Pain controlled with appropriate interventions Outcome: Progressing Denies pain.

## 2012-04-21 NOTE — Progress Notes (Signed)
Clinical Social Work Department BRIEF PSYCHOSOCIAL ASSESSMENT 04/21/2012  Patient:  [name],[name]     Account Number:  [id number]     Admit date:  04/04/2012  Clinical Social Worker:  Leron Croak, CLINICAL SOCIAL WORKER  Date/Time:  04/21/2012 04:48 PM  Referred by:  Physician  Date Referred:  04/20/2012 Referred for  SNF Placement   Other Referral:   Interview type:  Patient Other interview type:    PSYCHOSOCIAL DATA Living Status:  FAMILY Admitted from facility:   Level of care:   Primary support name:  Merilynn Finland Primary support relationship to patient:  SPOUSE Degree of support available:   fair....family has not visited with the Pt much.    CURRENT CONCERNS Current Concerns  Post-Acute Placement   Other Concerns:    SOCIAL WORK ASSESSMENT / PLAN CSW met with the Pt. Pt was having difiiculty with conversing and was able to answer minimal questions. Pt does understand that he needs SNF/Inpatient treatment and consents to Tx at either venue.   Assessment/plan status:  Information/Referral to [facility] Other assessment/ plan:   Information/referral to community resources:   CSW did not leave information at this time due to awaiting Inpatient eval    PATIENT'S/FAMILY'S RESPONSE TO PLAN OF CARE: Pt was pleasant and appressiative for assistance.        Leron Croak, LCSWA [employer] [phone]

## 2012-04-22 ENCOUNTER — Inpatient Hospital Stay (HOSPITAL_COMMUNITY): Payer: Medicare Other

## 2012-04-22 DIAGNOSIS — R1310 Dysphagia, unspecified: Secondary | ICD-10-CM

## 2012-04-22 LAB — GLUCOSE, CAPILLARY
Glucose-Capillary: 194 mg/dL — ABNORMAL HIGH (ref 70–99)
Glucose-Capillary: 219 mg/dL — ABNORMAL HIGH (ref 70–99)
Glucose-Capillary: 220 mg/dL — ABNORMAL HIGH (ref 70–99)
Glucose-Capillary: 226 mg/dL — ABNORMAL HIGH (ref 70–99)
Glucose-Capillary: 255 mg/dL — ABNORMAL HIGH (ref 70–99)
Glucose-Capillary: 331 mg/dL — ABNORMAL HIGH (ref 70–99)

## 2012-04-22 LAB — CBC
HCT: 25.1 % — ABNORMAL LOW (ref 39.0–52.0)
MCHC: 31.5 g/dL (ref 30.0–36.0)
MCV: 103.3 fL — ABNORMAL HIGH (ref 78.0–100.0)
RDW: 16.1 % — ABNORMAL HIGH (ref 11.5–15.5)

## 2012-04-22 LAB — BASIC METABOLIC PANEL
BUN: 47 mg/dL — ABNORMAL HIGH (ref 6–23)
CO2: 34 mEq/L — ABNORMAL HIGH (ref 19–32)
Chloride: 107 mEq/L (ref 96–112)
Creatinine, Ser: 0.8 mg/dL (ref 0.50–1.35)

## 2012-04-22 MED ORDER — INSULIN GLARGINE 100 UNIT/ML ~~LOC~~ SOLN
5.0000 [IU] | Freq: Once | SUBCUTANEOUS | Status: AC
  Administered 2012-04-22: 5 [IU] via SUBCUTANEOUS

## 2012-04-22 MED ORDER — INSULIN ASPART 100 UNIT/ML ~~LOC~~ SOLN
5.0000 [IU] | Freq: Four times a day (QID) | SUBCUTANEOUS | Status: DC
  Administered 2012-04-22: 5 [IU] via SUBCUTANEOUS

## 2012-04-22 MED ORDER — PREDNISONE 20 MG PO TABS
40.0000 mg | ORAL_TABLET | Freq: Two times a day (BID) | ORAL | Status: DC
  Administered 2012-04-22 (×2): 40 mg via ORAL
  Filled 2012-04-22 (×5): qty 2

## 2012-04-22 MED ORDER — INSULIN GLARGINE 100 UNIT/ML ~~LOC~~ SOLN
15.0000 [IU] | Freq: Every day | SUBCUTANEOUS | Status: DC
  Administered 2012-04-22 – 2012-04-25 (×4): 15 [IU] via SUBCUTANEOUS

## 2012-04-22 MED ORDER — PREDNISONE 5 MG/5ML PO SOLN
40.0000 mg | Freq: Two times a day (BID) | ORAL | Status: DC
  Filled 2012-04-22 (×2): qty 40

## 2012-04-22 NOTE — Progress Notes (Signed)
Occupational Therapy Treatment Patient Details Name: [name] MRN: [id number] DOB: Apr 30, 1929 Today's Date: 04/22/2012 Time: 3086-5784 OT Time Calculation (min): 18 min  OT Assessment / Plan / Recommendation Comments on Treatment Session Pt continuing to make slow progress towards goals. Con't to recommend LTACH or SNF at d/c    Follow Up Recommendations  Skilled nursing facility;LTACH    Barriers to Discharge       Equipment Recommendations  Defer to next venue    Recommendations for Other Services    Frequency Min 2X/week   Plan Discharge plan remains appropriate    Precautions / Restrictions Precautions Precautions: Fall Precaution Comments: decubs on R ankle and sacrum   Pertinent Vitals/Pain Stable throughout    ADL  Upper Body Dressing: Performed;Set up Where Assessed - Upper Body Dressing: Unsupported sitting Toilet Transfer: Simulated;+2 Total assistance Toilet Transfer: Patient Percentage: 60% Toilet Transfer Method: Stand pivot Toilet Transfer Equipment: Other (comment) (to recliner) Equipment Used: Rolling walker ADL Comments: Pt only agreeable to bed>chair transfer. Had just refused a bath from nurse techs.    OT Diagnosis:    OT Problem List:   OT Treatment Interventions:     OT Goals ADL Goals ADL Goal: Toilet Transfer - Progress: Progressing toward goals ADL Goal: Toileting - Hygiene - Progress: Progressing toward goals  Visit Information  Last OT Received On: 04/22/12 Assistance Needed: +2 PT/OT Co-Evaluation/Treatment: Yes    Subjective Data  Subjective: I don't want a bath yet.   Prior Functioning       Cognition  Overall Cognitive Status: No family/caregiver present to determine baseline cognitive functioning Arousal/Alertness: Awake/alert Orientation Level: Appears intact for tasks assessed Behavior During Session: [facility] for tasks performed    Mobility Bed Mobility Bed Mobility: Supine to Sit Supine to Sit: 4: Min assist;HOB  elevated;With rails Sitting - Scoot to Edge of Bed: 4: Min assist;With rail Details for Bed Mobility Assistance: extra time for activity and encouragement. Transfers Sit to Stand: 1: +2 Total assist;From elevated surface Sit to Stand: Patient Percentage: 60% Stand to Sit: 4: Min assist;To chair/3-in-1;With upper extremity assist Details for Transfer Assistance: pt wa sable to take several steps over to reclinerW   Exercises   Balance Static Sitting Balance Static Sitting - Balance Support: Feet supported;No upper extremity supported Static Sitting - Level of Assistance: 5: Stand by assistance  End of Session OT - End of Session Activity Tolerance: Patient limited by fatigue Patient left: in chair;with call bell/phone within reach  GO     Raychel Dowler A OTR/L [phone] 04/22/2012, 10:33 AM

## 2012-04-22 NOTE — Progress Notes (Signed)
Name: [name] MRN: [id number] DOB: [birth year]    LOS: 18  Referring Provider:  EDP Reason for Referral:  DKA  PULMONARY / CRITICAL CARE MEDICINE    Brief patient description:  78 yo diabetic admit from home via [facility] ED with nausea, vomiting and altered mental status. Treated for DKA but course also significant for sepsis due to skin source vs PNA, NSTEMI with shock (septic + cardiogenic), resp failure.   Events: 8/17 Transfer back to ICU for hypoxia>>?displacement of panda and aspiration of tube feeds  Current Status: much better.  Breathing improved.   Alert, denies pain Wanting 'something to eat'  Vital Signs: Temp:  [96.8 F (36 C)-97.9 F (36.6 C)] 97.8 F (36.6 C) (08/19 0800) Pulse Rate:  [63-74] 70  (08/19 0800) Resp:  [13-20] 13  (08/19 0800) BP: (115-136)/(50-66) 136/57 mmHg (08/19 0400) SpO2:  [92 %-100 %] 98 % (08/19 0800) FiO2 (%):  [50 %] 50 % (08/18 1000) Weight:  [75.9 kg (167 lb 5.3 oz)] 75.9 kg (167 lb 5.3 oz) (08/19 0400)    Intake/Output Summary (Last 24 hours) at 04/22/12 0836 Last data filed at 04/22/12 0800  Gross per 24 hour  Intake   2345 ml  Output   1105 ml  Net   1240 ml  NRB  Physical Examination: General: No distress Neuro: Alert, interactive HEENT: No sinus tenderness Neck:  Supple Cardiovascular: s1s2 regular, no murmur Lungs: Scattered rhonchi, no wheeze Abdomen:  Soft, non tender, bowel sounds present  Musculoskeletal: no edema Skin:  R foot ulcer, sacral decubitus ulcer  Dg Abd 1 View  04/22/2012  *RADIOLOGY REPORT*  Clinical Data: Enteric tube placement.  ABDOMEN - 1 VIEW  Comparison: 04/20/2012  Findings: Since the previous study, the enteric tube has been advanced.  The tip is now coiled in the left upper quadrant, likely in the upper stomach.  No evidence of bowel distension.  Fibrosis or infiltration in the lungs.  IMPRESSION: Enteric tube is coiled in the left upper quadrant with tip directed towards the upper stomach  region.  Original Report Authenticated By: Marlon Pel, M.D.   Dg Abd 1 View  04/20/2012  *RADIOLOGY REPORT*  Clinical Data: Feeding tube placement  ABDOMEN - 1 VIEW  Comparison: April 18, 2012  Findings:  Feeding tube tip is in the region of the body of the stomach.  Bowel gas pattern is unremarkable.  No free air seen on this supine examination.  Original Report Authenticated By: Arvin Collard. Gaspar Garbe, M.D.   Dg Chest Port 1 View  04/22/2012  *RADIOLOGY REPORT*  Clinical Data: Follow-up infiltrates.  PORTABLE CHEST - 1 VIEW  Comparison: 04/21/2012.  Findings: Shallow inspiration.  Mild cardiac enlargement.  The probable pulmonary vascular congestion.  Diffuse bilateral airspace and interstitial disease throughout both lungs, similar to previous study.  Changes may represent edema, pneumonia, or ARDS.  No blunting of costophrenic angles.  No pneumothorax.  Mediastinal contours appear intact.  Enteric tube tip is in the left upper quadrant consistent with location in the stomach.  IMPRESSION: Diffuse bilateral airspace and interstitial infiltrates appear stable since previous study.  Original Report Authenticated By: Marlon Pel, M.D.   Dg Chest Port 1 View  04/21/2012  *RADIOLOGY REPORT*  Clinical Data: Follow up infiltrates  PORTABLE CHEST - 1 VIEW  Comparison: Yesterday  Findings: Diffuse airspace disease slightly improved.  Feeding tube placed beyond the gastroesophageal junction.  No pneumothorax. Normal heart size.  IMPRESSION: Slightly improved bilateral airspace disease.  Feeding tube placed.  Original Report Authenticated By: Donavan Burnet, M.D.    ASSESSMENT AND PLAN  PULMONARY  ABG    Component Value Date/Time   PHART 7.366 04/20/2012 0624   PCO2ART 57.1* 04/20/2012 0624   PO2ART 67.5* 04/20/2012 0624   HCO3 31.9* 04/20/2012 0624   TCO2 30.1 04/20/2012 0624   ACIDBASEDEF 3.5* 04/09/2012 0400   O2SAT 90.7 04/20/2012 0624   ETT:  8/2>>>8/4 ETT:  8/5>>>8/15  A:  Acute  respiratory failure in setting of septic shock skin and DKA.  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  Worsening hypoxia 8/17 with ?aspiration of tube feeds>>improved 8/18. P:   dc solumedrol  - PO pred 40 bid, rpt ESR F/u CXR intermittently q 2-3 ds Titrate oxygen to keep SpO2 > 90% D/c BPAP Keep in even fluid balance  CARDIOVASCULAR  Lab 04/16/12 1020  TROPONINI --  LATICACIDVEN --  PROBNP 2493.0*     Lines: CVL  L [place] 8/2>>8/14  Echo 8/02>>EF 40 to 45%, diffuse hypokinesis, grade 1 diastolic dysfx, mod RV systolic dysfx  A:  Septic shock>>resolved. NSTEMI and superimposed cardiogenic shock>>shock resolved. Acute combined CHF with acute pulmonary edema>>improved. Cardiology signed off 8/07>>recommended medical management. P:  Restarted ASA 8/13 (held due to concern for GI bleeding) Continue lopressor  RENAL  Lab 04/22/12 0330 04/21/12 0401 04/20/12 0851 04/19/12 0323 04/18/12 0310  NA 145 147* 149* 148* 149*  K 4.4 4.5 -- -- --  CL 107 107 109 108 107  CO2 34* 35* 33* 34* 34*  BUN 47* 51* 52* 64* 67*  CREATININE 0.80 0.84 0.92 0.96 1.01  CALCIUM 8.3* 8.4 8.5 8.3* 8.6  MG -- -- -- -- --  PHOS -- -- -- -- --   Intake/Output      08/18 0701 - 08/19 0700 08/19 0701 - 08/20 0700   I.V. (mL/kg) 450 (5.9) 20 (0.3)   NG/GT 1925 45   IV Piggyback     Total Intake(mL/kg) 2375 (31.3) 65 (0.9)   Urine (mL/kg/hr) 1215 (0.7)    Total Output 1215    Net +1160 +65         Foley:  8/1 >>>  A:  Mild hypernatremia -resolved P:  F/u BMET, ct free water flushes  GASTROINTESTINAL  Lab 04/20/12 0851 04/19/12 0323 04/17/12 2353 04/16/12 0340  AST 25 21 20  32  ALT 14 11 11 12   ALKPHOS 92 89 111 135*  BILITOT 0.2* 0.2* 0.3 0.3  PROT 6.4 6.1 6.0 5.6*  ALBUMIN 1.6* 1.6* 1.4* 1.4*   A: ileus >>resolved.   Diarrhea (C diff negative 8/12)>>improved. Nutrition, protein calorie  malnutrition. Dysphagia P: Tube feeds via panda tube  NPO Recheck swallow per SLP  HEMATOLOGIC  Lab 04/22/12 0330 04/21/12 0401 04/20/12 0747 04/19/12 0323 04/18/12 0310  HGB 7.9* 9.1* 10.4* 8.2* 7.6*  HCT 25.1* 29.4* 33.2* 26.3* 23.8*  PLT 442* 431* 338 329 263  INR -- -- -- -- --  APTT -- -- -- -- --   A:  Anemia of critical illness, chronic disease, and iron deficiency.  Required 1u PRBC 8/9. P:  F/u CBC intermittently Added FeSO4 8/14 Transfuse for Hb < 7   INFECTIOUS  Lab 04/22/12 0330 04/21/12 0401 04/20/12 0747 04/19/12 0323 04/18/12 0310 04/16/12 0340  WBC 23.2* 23.9* 19.3* 22.0* 28.6* --  PROCALCITON -- -- -- -- -- 0.84   Cultures: 8/1 Blood >>> negative 8/1 MRSA screen: negative  Antibiotics: Vancomycin 8/1 (sepsis , skin source)>>> 8/8 Zosyn 8/1 (sepsis, skin source)>>> 8/12  A:  Possible infected diabetic ulcer / sacral decub ulcer.  Possible aspiration tube feeds 8/17>>no evidence for infection. Leukocytosis>>likely from steroids. P:   F/U CBC Monitor off Abx   ENDOCRINE  Lab 04/22/12 0746 04/22/12 0330 04/21/12 2324 04/21/12 1953 04/21/12 1624  GLUCAP 255* 226* 217* 275* 291*   A:  DM.  DKA secondary to noncompliance (history of noncompliance).   Hypothyroidism. Hypoglycemic event 8/17. P:   Synthroid D/c Dextrose from IV fluid now that he is on tube feeds again Increase Lantus to 15 and ssi , add TF coverage if remains high Diabetes coordinator assisting  NEUROLOGIC  Head CT:  8/1 >>> nad  A:  Acute encephalopathy, metabolic and due to sepsis>>resolved. P: PRN analgesia PT following   BEST PRACTICE / DISPOSITION Level of Care:  SDU Primary Service:  PCCM Consultants:  None Code Status:  Limited >> no CPR/defib Diet: Tube feeds DVT Px:  SQ heparin GI Px:  PPI Skin Integrity:  As above - per  wound care consult Social / Family: No family at bedside  He was being evaluated for transfer to [facility]. Now that respiratory status  improved, ready 8/19 for transfer   Cyril Mourning MD. [facility]. [place] [facility] Pager [phone] If no response call [phone]   04/22/2012, 8:36 AM

## 2012-04-22 NOTE — Progress Notes (Signed)
CARE MANAGEMENT NOTE 04/22/2012  Patient:  [name],[name]   Account Number:  [id number]  Date Initiated:  04/05/2012  Documentation initiated by:  Cayleb Jarnigan  Subjective/Objective Assessment:   patient with known history of dka, not taking insulin presented with bld glucose over800, required intubation.     Action/Plan:   from home, very noncompliant with lifestyle and meds   Anticipated DC Date:  04/25/2012   Anticipated DC Plan:  LONG TERM ACUTE CARE (LTAC)  In-house referral  NA      DC Planning Services  CM consult      PAC Choice  NA   Choice offered to / List presented to:  NA   DME arranged  NA      DME agency  NA     HH arranged  NA      HH agency  NA   Status of service:  In process, will continue to follow Medicare Important Message given?  NA - LOS <3 / Initial given by admissions (If response is "NO", the following Medicare IM given date fields will be blank) Date Medicare IM given:   Date Additional Medicare IM given:    Discharge Disposition:    Per UR Regulation:  Reviewed for med. necessity/level of care/duration of stay  If discussed at Long Length of Stay Meetings, dates discussed:    Comments:  [id number]/Aedan Geimer Earlene Plater, RN, BSN, CCM: CASE MANAGEMENT [phone] apatient transfered back into icu [id number]  for resp, status and lethgary, cbg=89.  Awaits LTAC bed -awaiting insurance verification.   [id number]/LKGMWN Earlene Plater, RN, BSN, CCM: CHART REVIEWED AND UPDATED. Referal for ltac placement done and rep. notified.  Patient was extubated this am , o2 wavering but holding. CASE MANAGEMENT [phone]    [id number]/Irisha Grandmaison Earlene Plater, RN, BSN, CCM: CHART REVIEWED AND UPDATED. NO DISCHARGE NEEDS PRESENT AT THIS TIME. CASE MANAGEMENT [phone]

## 2012-04-22 NOTE — Progress Notes (Signed)
Physical Therapy Treatment Patient Details Name: [name] MRN: [id number] DOB: 1929/03/27 Today's Date: 04/22/2012 Time: 4098-1191 PT Time Calculation (min): 18 min  PT Assessment / Plan / Recommendation Comments on Treatment Session  Pt. encouraged to get up into recliner. Pt. did participate w/ therapies. note that LTAC is being pursued s. will recommend that or SNF. CIR is not currently a viable rec.      Follow Up Recommendations  LTACH;Skilled nursing facility;Supervision/Assistance - 24 hour    Barriers to Discharge        Equipment Recommendations  None recommended by PT    Recommendations for Other Services    Frequency     Plan Discharge plan needs to be updated;Frequency remains appropriate    Precautions / Restrictions Precautions Precautions: Fall Precaution Comments: decubs on R ankle and sacrum   Pertinent Vitals/Pain  sats remained > 93 % on 3 l.      Mobility  Bed Mobility Bed Mobility: Supine to Sit Supine to Sit: 4: Min assist;HOB elevated;With rails Sitting - Scoot to Edge of Bed: 4: Min assist;With rail Details for Bed Mobility Assistance: extra time for activity and encouragement. Transfers Sit to Stand: 1: +2 Total assist;From elevated surface Sit to Stand: Patient Percentage: 60% Stand to Sit: 4: Min assist;To chair/3-in-1;With upper extremity assist Stand Pivot Transfers: 1: +2 Total assist Stand Pivot Transfers: Patient Percentage: 60% Details for Transfer Assistance: pt wa sable to take several steps over to reclinerW Ambulation/Gait Ambulation/Gait Assistance: 1: +2 Total assist Ambulation/Gait: Patient Percentage: 60% Ambulation Distance (Feet): 5 Feet Assistive device: Rolling walker Gait velocity: decreased    Exercises General Exercises - Lower Extremity Long Arc Quad: AROM;Both;10 reps;Seated   PT Diagnosis:    PT Problem List:   PT Treatment Interventions:     PT Goals Acute Rehab PT Goals Pt will go Supine/Side to Sit:  with supervision PT Goal: Supine/Side to Sit - Progress: Progressing toward goal Pt will go Sit to Stand: with supervision PT Goal: Sit to Stand - Progress: Progressing toward goal Pt will go Stand to Sit: with supervision PT Goal: Stand to Sit - Progress: Progressing toward goal Pt will Ambulate: 51 - 150 feet;with rolling walker PT Goal: Ambulate - Progress: Progressing toward goal Pt will Perform Home Exercise Program: with supervision, verbal cues required/provided PT Goal: Perform Home Exercise Program - Progress: Goal set today  Visit Information  Last PT Received On: 04/22/12 Assistance Needed: +2    Subjective Data  Subjective: I don't want to get up. I am weak   Cognition  Overall Cognitive Status: Appears within functional limits for tasks assessed/performed Arousal/Alertness: Awake/alert Behavior During Session: [facility] for tasks performed    Balance  Static Sitting Balance Static Sitting - Balance Support: Feet supported;No upper extremity supported Static Sitting - Level of Assistance: 5: Stand by assistance  End of Session PT - End of Session Activity Tolerance: Patient tolerated treatment well Patient left: in chair;with call bell/phone within reach Nurse Communication: Mobility status;Other (comment) (pt should be able to stand and pivot w/ 2 persons)   GP     Rada Hay 04/22/2012, 10:28 AM [phone]

## 2012-04-22 NOTE — Progress Notes (Signed)
Rt set up patient on CPAP and then checked on him within 15 minutes, patient took off BIPAP and sats dropped to 84%.  Patient did not want to put nasal BIPAP back on, he said it was very uncomfortable and he couldn't breathe.  Rt put [place] 2L back on and patient was stable.

## 2012-04-22 NOTE — Progress Notes (Signed)
Nutrition Follow-up  Intervention:   1.  Continue Current TF with Oxepa at 45 ml/hr plus Prostat bid which provides:  1820 kcal, 98 gm protein daily.  Free water 200 ml every 6 hours. 2.  RD to follow for nutrition plan of care.  Assessment:  Extubated.  Seen by SLP who recommended pt remain NPO.  Tolerating TF.  Diet Order:  NPO  Meds: Scheduled Meds:   . antiseptic oral rinse  15 mL Mouth Rinse QID  . aspirin  81 mg Oral Daily  . chlorhexidine  15 mL Mouth Rinse BID  . collagenase   Topical Daily  . feeding supplement  30 mL Oral BID WC  . ferrous sulfate  300 mg Per Tube TID  . free water  200 mL Per Tube Q6H  . heparin subcutaneous  5,000 Units Subcutaneous Q8H  . insulin aspart  0-20 Units Subcutaneous Q4H  . insulin aspart  5 Units Subcutaneous Q6H  . insulin glargine  15 Units Subcutaneous QHS  . insulin glargine  5 Units Subcutaneous Once  . levothyroxine  75 mcg Per Tube QAC breakfast  . metoprolol tartrate  12.5 mg Per Tube BID  . predniSONE  40 mg Oral BID WC  . ranitidine  150 mg Oral BID  . DISCONTD: insulin glargine  10 Units Subcutaneous QHS  . DISCONTD: methylPREDNISolone (SOLU-MEDROL) injection  40 mg Intravenous Q8H  . DISCONTD: predniSONE  40 mg Per Tube BID WC   Continuous Infusions:   . sodium chloride 20 mL/hr (04/21/12 1038)  . feeding supplement (OXEPA) 1,000 mL (04/20/12 1323)   PRN Meds:.albuterol, HYDROcodone-acetaminophen, ipratropium  Labs:  CMP     Component Value Date/Time   NA 145 04/22/2012 0330   K 4.4 04/22/2012 0330   CL 107 04/22/2012 0330   CO2 34* 04/22/2012 0330   GLUCOSE 247* 04/22/2012 0330   BUN 47* 04/22/2012 0330   CREATININE 0.80 04/22/2012 0330   CALCIUM 8.3* 04/22/2012 0330   PROT 6.4 04/20/2012 0851   ALBUMIN 1.6* 04/20/2012 0851   AST 25 04/20/2012 0851   ALT 14 04/20/2012 0851   ALKPHOS 92 04/20/2012 0851   BILITOT 0.2* 04/20/2012 0851   GFRNONAA 82* 04/22/2012 0330   GFRAA >90 04/22/2012 0330     Intake/Output Summary  (Last 24 hours) at 04/22/12 1158 Last data filed at 04/22/12 1100  Gross per 24 hour  Intake   2195 ml  Output   1205 ml  Net    990 ml    Weight Status:  Weight increased from 156# 1 week ago.  Overall stable. Wt Readings from Last 3 Encounters:  04/22/12 167 lb 5.3 oz (75.9 kg)  11/22/11 138 lb 0.1 oz (62.6 kg)  10/20/11 138 lb (62.596 kg)    Re-estimated needs:  1670-1870 kcal, 91-114 grams of protein  Nutrition Dx:  Inadequate Oral Intake, ongoing  Goal:   1. Positive tolerance of TF, -Meeting, continue.  2. Meet > 90% of estimated energy needs with nutrition support. -Meeting, continue.    Monitor:  Diet advancement, TF tolerance, weights, labs, skin  Oran Rein, RD [phone]

## 2012-04-22 NOTE — Progress Notes (Addendum)
Speech Language Pathology Dysphagia Treatment Patient Details Name: [name] MRN: [id number] DOB: 1929/04/27 Today's Date: 04/22/2012 Time: 1020-1050 SLP Time Calculation (min): 30 min  Assessment / Plan / Recommendation Clinical Impression  Pt with minimal phonation improvement (4-5 breathy words per breath group) and improved swallow efficiency/timeliness compared to Friday Aug 16th but he continues with clinical indicators of oropharyngeal dysphagia and suspected ongoing aspiration.  Note over the w/e, pt with feed tube dislodgement and ? asp of tube feeds.  Pt states he recalls feeding tube being replaced and him trying to "gag it out", ? if this could've increased pharyngeal edema slightly.    Delayed multiple swallows, overt throat clearing and cough continue with ice, nectar juice and applesauce.  Pt denies dysphagia PTA, conservate plan is to continue npo and re-eval clinically for instrumental study readiness.  Cough is strong currently and pt is using Yankeur's to remove expectorated material, but pt continues with intermittent wet voice without awareness.  Suspect deconditioning is contributing to pt's dysphagia.    Rec NPO continue with aggressive oral care and initiation of speech/dysphagia tx as soon as able to decrease disuse muscle atrophy.  Note plans for select, this clinician will follow pt clinically to aid in determining readiness for po/MBS.  Pt verbalized understanding, although he is disappointed as he desires to eat/drink.   If pt does not clinically improve in the next few days, rec to pursue instrumental assessment for goals.     Diet Recommendation  Continue with Current Diet: NPO    SLP Plan Continue with current plan of care   Pertinent Vitals/Pain Afebrile, decreased  CXR 8/18 slight improved Airspace Dx   Swallowing Goals     General Temperature Spikes Noted: No Respiratory Status: Supplemental O2 delivered via (comment) Behavior/Cognition:  Alert;Cooperative;Pleasant mood Oral Cavity - Dentition: Missing dentition (does not wear dentures at home), eats soft foods prior to admission-ground meats per his statement Patient Positioning: Upright in chair  Oral Cavity - Oral Hygiene   oral cavity appeared clean, pt with suction at bedside.  Dysphagia Treatment Treatment focused on: Upgraded PO texture trials;Patient/family/caregiver education Family/Caregiver Educated: pt Treatment Methods/Modalities: Skilled observation;Differential diagnosis Patient observed directly with PO's: Yes Type of PO's observed: Dysphagia 1 (puree);Thin liquids;Nectar-thick liquids Feeding: Needs assist Liquids provided via: Teaspoon;Cup Pharyngeal Phase Signs & Symptoms: Suspected delayed swallow initiation;Multiple swallows;Wet vocal quality;Immediate throat clear;Immediate cough;Delayed cough;Complaints of residue Type of cueing: Verbal (to dry swallow and cough to expectorate) Amount of cueing: Minimal   GO     Donavan Burnet, MS [facility] SLP [phone]

## 2012-04-23 ENCOUNTER — Inpatient Hospital Stay (HOSPITAL_COMMUNITY): Payer: Medicare Other

## 2012-04-23 DIAGNOSIS — G92 Toxic encephalopathy: Secondary | ICD-10-CM

## 2012-04-23 DIAGNOSIS — A4189 Other specified sepsis: Secondary | ICD-10-CM

## 2012-04-23 DIAGNOSIS — R5381 Other malaise: Secondary | ICD-10-CM

## 2012-04-23 LAB — CBC
Hemoglobin: 7.9 g/dL — ABNORMAL LOW (ref 13.0–17.0)
RBC: 2.47 MIL/uL — ABNORMAL LOW (ref 4.22–5.81)
WBC: 24.6 10*3/uL — ABNORMAL HIGH (ref 4.0–10.5)

## 2012-04-23 LAB — GLUCOSE, CAPILLARY
Glucose-Capillary: 188 mg/dL — ABNORMAL HIGH (ref 70–99)
Glucose-Capillary: 186 mg/dL — ABNORMAL HIGH (ref 70–99)
Glucose-Capillary: 186 mg/dL — ABNORMAL HIGH (ref 70–99)
Glucose-Capillary: 159 mg/dL — ABNORMAL HIGH (ref 70–99)
Glucose-Capillary: 177 mg/dL — ABNORMAL HIGH (ref 70–99)
Glucose-Capillary: 207 mg/dL — ABNORMAL HIGH (ref 70–99)

## 2012-04-23 LAB — BASIC METABOLIC PANEL
CO2: 33 mEq/L — ABNORMAL HIGH (ref 19–32)
Chloride: 105 mEq/L (ref 96–112)
Glucose, Bld: 214 mg/dL — ABNORMAL HIGH (ref 70–99)
Potassium: 4.4 mEq/L (ref 3.5–5.1)
Sodium: 141 mEq/L (ref 135–145)

## 2012-04-23 MED ORDER — PREDNISONE 5 MG/ML PO CONC
40.0000 mg | Freq: Two times a day (BID) | ORAL | Status: DC
  Administered 2012-04-23 – 2012-04-26 (×6): 40 mg
  Filled 2012-04-23 (×14): qty 8

## 2012-04-23 NOTE — Consult Note (Signed)
I have taken a history, examined the patient and reviewed the chart. I agree with the extender's note, impression and recommendations. Oropharyngeal dysphagia now on tube feeds and possibly an esophageal motility disorder. I suspect that both are chronic problems and that his oropharyngeal dysphagia has worsened with his acute illnesses. Given aspiration risk would not proceed with a BA esophagram at this time. EGD to rule out other esophageal problems. 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 Will hold on EGD for now given recent NSTEMI. Will attempt to locate colonoscopy records.  Meryl Dare MD [facility]

## 2012-04-23 NOTE — Consult Note (Signed)
Referring Provider: No ref. provider found Primary Care Physician:  [name], MD Primary Gastroenterologist:  Dr. Russella Dar  Reason for Consultation:  Dysphagia; esophageal dysmotility  HPI: [name] is a 78 y.o. male who is diabetic and was sent to [facility] ED with nausea, vomiting and altered mental status on 04/04/2012.  Apparently was withholding insulin as he did not feel well for several weeks, and he was found to be hyperglycemic/acidotic at that time.  He was admitted to the ICU and required intubation on 8/2 for acute respiratory failure.  Patient was slowly extubated and noted to have dysphagia.  Speech has been evaluating patient and results of most recent barium swallow study, which was performed today showed multifactorial dysphagia at multiple levels in the swallowing process (study results listed below).  Patient reports that he has experienced issues with swallowing at home for the last few years at least.  He thought is was just because he would eat too fast, etc, and he did not think that it was a problem.  Occasionally chokes on solid food, but does better if he chews well and eats slowly.  More often he chokes on liquids.  Says that they "go down the wrong tube".  Patient is also anemic with severe iron deficiency and iron <10.  Received one unit of PRBC's on 8/9 and has been placed on iron supplements with 300mg  syrup three times daily.  Hgb today is 7.9 grams.  Had an EGD by Dr. Russella Dar in 07/2008 for the diagnosis of "duodenal polyp on CT scan".  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  Pathology of the polyp showed hyperplastic polyp with focal low grade dysplasia with extensive intestinal metaplasia and Hpylori organisms.  He was supposed to have a repeat EGD in 2 years, but I do not see any documentation that it was ever performed.  Patient states that he had a colonoscopy as well, but cannot recall details, and  once again, there are no results in the system.  Past Medical History  Diagnosis Date  . Diabetes mellitus   . Cancer   . Thyroid disease   . Anemia   . Hyperlipidemia   . Neuropathy, diabetic   . Diabetic retinopathy     Past Surgical History  Procedure Date  . Cataract extraction, bilateral   . Colonoscopy w/ polypectomy     Prior to Admission medications   Medication Sig Start Date End Date Taking? Authorizing Provider  ferrous sulfate 325 (65 FE) MG tablet Take 325 mg by mouth 2 (two) times daily.    Yes Historical Provider, MD  folic acid (FOLVITE) 1 MG tablet Take 1 mg by mouth daily.   Yes Historical Provider, MD  insulin aspart protamine-insulin aspart (NOVOLOG 70/30) (70-30) 100 UNIT/ML injection Inject 15-18 Units into the skin 2 (two) times daily. 18 units in am, 15 units in pm   Yes Historical Provider, MD  levothyroxine (SYNTHROID, LEVOTHROID) 25 MCG tablet Take 25 mcg by mouth daily.   Yes Historical Provider, MD  levothyroxine (SYNTHROID, LEVOTHROID) 50 MCG tablet Take 50 mcg by mouth daily. Take with 25 mcg tablet . Total dose is 75 mcg.   Yes Historical Provider, MD  ranitidine (ZANTAC) 150 MG tablet Take 150 mg by mouth 2 (two) times daily.   Yes Historical Provider, MD  simvastatin (ZOCOR) 20 MG tablet Take 30 mg by mouth every evening. Take one and  a half tablet by mouth at bedtime   Yes Historical Provider, MD  Tamsulosin HCl (FLOMAX) 0.4 MG CAPS Take 0.4 mg by mouth daily after supper.   Yes Historical Provider, MD  vitamin B-12 (CYANOCOBALAMIN) 1000 MCG tablet Take 1,000 mcg by mouth daily.   Yes Historical Provider, MD  vitamin C (ASCORBIC ACID) 500 MG tablet Take 500 mg by mouth 2 (two) times daily.   Yes Historical Provider, MD    Current Facility-Administered Medications  Medication Dose Route Frequency Provider Last Rate Last Dose  . 0.45 % sodium chloride infusion   Intravenous Continuous Coralyn Helling, MD 20 mL/hr at 04/21/12 1038 20 mL/hr at 04/21/12  1038  . albuterol (PROVENTIL) (5 MG/ML) 0.5% nebulizer solution 2.5 mg  2.5 mg Nebulization Q2H PRN Coralyn Helling, MD      . antiseptic oral rinse (BIOTENE) solution 15 mL  15 mL Mouth Rinse QID Lonia Farber, MD   15 mL at 04/23/12 1208  . aspirin chewable tablet 81 mg  81 mg Oral Daily Coralyn Helling, MD   81 mg at 04/23/12 1208  . chlorhexidine (PERIDEX) 0.12 % solution 15 mL  15 mL Mouth Rinse BID Lonia Farber, MD   15 mL at 04/23/12 0818  . collagenase (SANTYL) ointment   Topical Daily Lonia Farber, MD      . feeding supplement (OXEPA) liquid 1,000 mL  1,000 mL Per Tube Continuous Anastasio Champion, RD 45 mL/hr at 04/23/12 1151 1,000 mL at 04/23/12 1151  . feeding supplement (PRO-STAT SUGAR FREE 64) liquid 30 mL  30 mL Oral BID WC Anastasio Champion, RD   30 mL at 04/23/12 1206  . ferrous sulfate 300 (60 FE) MG/5ML syrup 300 mg  300 mg Per Tube TID Coralyn Helling, MD   300 mg at 04/23/12 1207  . free water 200 mL  200 mL Per Tube Q6H Simonne Martinet, NP   200 mL at 04/23/12 1209  . heparin injection 5,000 Units  5,000 Units Subcutaneous Q8H Coralyn Helling, MD   5,000 Units at 04/23/12 0507  . HYDROcodone-acetaminophen (LORTAB) 7.5-500 MG/15ML solution 10 mL  10 mL Per Tube Q4H PRN Simonne Martinet, NP      . insulin aspart (novoLOG) injection 0-20 Units  0-20 Units Subcutaneous Q4H Coralyn Helling, MD   4 Units at 04/23/12 1233  . insulin glargine (LANTUS) injection 15 Units  15 Units Subcutaneous QHS Oretha Milch, MD   15 Units at 04/22/12 2138  . ipratropium (ATROVENT) nebulizer solution 0.5 mg  0.5 mg Nebulization Q2H PRN Coralyn Helling, MD      . levothyroxine (SYNTHROID, LEVOTHROID) tablet 75 mcg  75 mcg Per Tube QAC breakfast Lonia Farber, MD   75 mcg at 04/23/12 1208  . metoprolol tartrate (LOPRESSOR) 25 mg/10 mL oral suspension 12.5 mg  12.5 mg Per Tube BID Coralyn Helling, MD   12.5 mg at 04/23/12 1206  . predniSONE 5 MG/ML concentrated solution 40 mg  40 mg  Per Tube BID WC Lonia Farber, MD   40 mg at 04/23/12 1210  . ranitidine (ZANTAC) 150 MG/10ML syrup 150 mg  150 mg Oral BID Lonia Farber, MD   150 mg at 04/23/12 1206  . DISCONTD: insulin aspart (novoLOG) injection 5 Units  5 Units Subcutaneous Q6H Oretha Milch, MD   5 Units at 04/22/12 1833  . DISCONTD: predniSONE (DELTASONE) tablet 40 mg  40 mg Oral BID WC Lonia Farber, MD   40 mg at 04/22/12  1721   Facility-Administered Medications Ordered in Other Encounters  Medication Dose Route Frequency Provider Last Rate Last Dose  . etomidate (AMIDATE) injection    PRN Illene Silver, CRNA   12 mg at 04/17/12 0242  . succinylcholine (ANECTINE) injection    PRN Illene Silver, CRNA   80 mg at 04/17/12 0242    Allergies as of 04/04/2012  . (No Known Allergies)    Family History  Problem Relation Age of Onset  . Hypertension      History   Social History  . Marital Status: Married    Spouse Name: N/A    Number of Children: N/A  . Years of Education: N/A   Occupational History  . 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 Smoking status: Former Smoker -- 0.2 packs/day    Types: Cigarettes    Quit date: 11/22/2006  . Smokeless tobacco: Never Used  . Alcohol Use: No  . Drug Use: No  . Sexually Active: No   Other Topics Concern  . Not on file   Social History Narrative  . No narrative on file    Review of Systems: Ten point ROS O/W negative except as mentioned in HPI.  Physical Exam: Vital signs in last 24 hours: Temp:  [97.3 F (36.3 C)-98.5 F (36.9 C)] 97.3 F (36.3 C) (08/20 1320) Pulse Rate:  [53-72] 56  (08/20 1320) Resp:  [10-22] 16  (08/20 1320) BP: (115-145)/(53-72) 135/59 mmHg (08/20 1320) SpO2:  [93 %-100 %] 95 % (08/20 1320) Weight:  [165 lb 5.5 oz (75 kg)] 165 lb 5.5 oz (75 kg) (08/20 0200) Last BM Date: 04/21/18 General:   Alert, thin and chronically ill-appearing, pleasant and cooperative in NAD Head:   Normocephalic and atraumatic. Eyes:  Sclera clear, no icterus.  Conjunctiva pink. Ears:  Normal auditory acuity. Mouth:  No deformity or lesions.  Poor dentition.   Lungs:  Rales heard throughout B/L lung fields. Heart:  Regular rate and rhythm; no murmurs, clicks, rubs, or gallops. Abdomen:  Soft,nontender, BS active,nonpalp mass or hsm.   Rectal:  Deferred  Msk:  Symmetrical without gross deformities. . Pulses:  Normal pulses noted. Extremities:  Without clubbing or edema.  Wounds with dressings on B/L lower extremities Neurologic:  Alert and  oriented x4;  grossly normal neurologically. Skin:  Intact without significant lesions or rashes. Psych:  Alert and cooperative. Normal mood and affect.  Intake/Output from previous day: 08/19 0701 - 08/20 0700 In: 1570 [I.V.:380; NG/GT:890] Out: 950 [Urine:950] Intake/Output this shift: Total I/O In: 325 [I.V.:80; Other:200; NG/GT:45] Out: 400 [Urine:400]  Lab Results: Iron <10, %sat unable to be calculated, TIBC unable to be calculated  [facility] 04/23/12 0328 04/22/12 0330 04/21/12 0401  WBC 24.6* 23.2* 23.9*  HGB 7.9* 7.9* 9.1*  HCT 25.4* 25.1* 29.4*  PLT 450* 442* 431*   BMET  Basename 04/23/12 0328 04/22/12 0330 04/21/12 0401  NA 141 145 147*  K 4.4 4.4 4.5  CL 105 107 107  CO2 33* 34* 35*  GLUCOSE 214* 247* 275*  BUN 38* 47* 51*  CREATININE 0.75 0.80 0.84  CALCIUM 8.3* 8.3* 8.4   Studies/Results: Modified Barium Swallow study:   Dysphagia Diagnosis: Moderate pharyngeal phase dysphagia;Moderate oral phase dysphagia;Moderate cervical esophageal phase dysphagia;Suspected primary esophageal dysphagia  Clinical impression: Multifactorial dysphagia noted with oropharyngeal, cervical esophageal and suspected esophageal deficits. Delayed oral transiting - lingual pumping noted- with oral stasis that prematurely spills into pharynx without  pt awareness. Pt with decr opening of UES resulting in stasis of liquids (without pt  awareness) that spills into posterior trachea . Larger bolus amounts of liquids appear to aid UES opening - likely due to larger weight. Various postures (*head turn, right/left, chin tuck). Pt tracely aspirated thin and nectar both during and after the swallow both due to decr laryngeal elevation/closure and after from shallow pyriform sinus stasis that spills into posterior trach. Pharyngeal stasis of applesauce and cracker is worse that with liquids due to weakness. Pt did cough and clear his throat throughout entire MBS and aspiration of secretions was noted.   IMPRESSION:  -Multifactorial dysphagia seen on Barium Swallow study.  Sounds mostly oropharyngeal/motility disorder by history, but possibly esophageal component as well.  Patient indicates that he was having problems with swallowing and choking for several years at home.  Patient may have slight improvement in his swallowing as he recovers from his acute issues, but it appears that this is going to be a chronic issue to some degree. -s/p VDRF secondary to acute respiratory failure/ARDS -DKA-resolved -Septic shock with acute encephalopathy-resolved. -NSTEMI-thought to be secondary to demand ischemia -Anemia-multifactorial; secondary to critical illness, chronic disease, and profound iron deficiency; s/p 1 unit of PRBC's on 8/9 with no overt sign of GIB.  On iron supplements as well. -Diabetic/sacral decubitus ulcers-WOC following patient -Leukocytosis-thought to be seconday to steroids; off antibiotics now. -DM-not well controlled -Hypothyroidism   PLAN: -Due to cardiac and pulmonary co-morbidities would not recommend EGD evaluation at this time.  Esophagram could also be considered, but obviously this is not optimal at this time due to need to give him barium liquid.  We will follow along with Speech to see if there is any improvement and determine if there is indication to perform either of these studies at a later date. -follow Hgb and  transfuse prn; continue iron supplements -will try to find further records recording any colonoscopy, etc.   Latasha Puskas D.  04/23/2012, 2:13 PM  Pager number [phone]

## 2012-04-23 NOTE — Progress Notes (Signed)
eLink Physician-Brief Progress Note Patient Name: [name] DOB: [birth year] MRN: [id number]  Date of Service  04/23/2012   HPI/Events of Note   Panda pulled out by pt, will leave out and hope for improved swallow fxn in am  Glu 194, lantus given x 2 today, dc insulin now as TF off   eICU Interventions  cbg in 1 hr, likley will need  d10 at 20-40 cc to avoid hypo, although has been tough to control overall      FEINSTEIN,DANIEL J. 04/23/2012, 12:18 AM

## 2012-04-23 NOTE — Consult Note (Signed)
Physical Medicine and Rehabilitation Consult Reason for Consult: Acute encephalopathy/critical illness deconditioning Referring Physician: Critical care medicine   HPI: [name] is a 78 y.o. right-handed male  With diabetes mellitus and peripheral neuropathy coronary artery disease and congestive heart failure. Admitted 04/04/2012 with nausea, vomiting and altered mental status. Found to be hyperglycemic as well as acidotic. Cranial CT scan with chronic changes without acute abnormality. Patient was hypotensive secondary to dehydration with creatinine 1.41 and placed on intravenous fluids. Noted marginally elevated cardiac enzymes. Patient was intubated followed by critical care medicine. Followup cardiology services Dr. Patty Sermons with EKG showing normal sinus rhythm right bundle branch block and left anterior fascicular block consistent with type II NSTEMI felt to be secondary to demand ischemia from his sepsis and hypotension. Patient was slowly extubated. Maintained on subcutaneous heparin for DVT prophylaxis. Wound care nurse consulted for multiple wounds most significant located on the right lateral malleolus and left lower buttocks with appropriate skin care recommended. Noted ongoing bouts of confusion altered mental status felt to be secondary to acute encephalopathy due to sepsis that has steadily improved. He is currently n.p.o. with nasogastric tube feeds as advised. Physical occupational therapy ongoing with noted profound deconditioning there is question of L. tach versus skilled nursing facility versus inpatient rehabilitation services. M.D. is requested physical medicine rehabilitation consult to consider inpatient rehabilitation services   Review of Systems  Gastrointestinal: Positive for nausea and vomiting.  All other systems reviewed and are negative.   Past Medical History  Diagnosis Date  . Diabetes mellitus   . Cancer   . Thyroid disease   . Anemia   . Hyperlipidemia     . Neuropathy, diabetic   . Diabetic retinopathy    Past Surgical History  Procedure Date  . Cataract extraction, bilateral   . Colonoscopy w/ polypectomy    Family History  Problem Relation Age of Onset  . Hypertension     Social History:  reports that he quit smoking about 5 years ago. His smoking use included Cigarettes. He smoked .25 packs per day. He has never used smokeless tobacco. He reports that he does not drink alcohol or use illicit drugs. Allergies: No Known Allergies Medications Prior to Admission  Medication Sig Dispense Refill  . ferrous sulfate 325 (65 FE) MG tablet Take 325 mg by mouth 2 (two) times daily.       . folic acid (FOLVITE) 1 MG tablet Take 1 mg by mouth daily.      . insulin aspart protamine-insulin aspart (NOVOLOG 70/30) (70-30) 100 UNIT/ML injection Inject 15-18 Units into the skin 2 (two) times daily. 18 units in am, 15 units in pm      . levothyroxine (SYNTHROID, LEVOTHROID) 25 MCG tablet Take 25 mcg by mouth daily.      Marland Kitchen levothyroxine (SYNTHROID, LEVOTHROID) 50 MCG tablet Take 50 mcg by mouth daily. Take with 25 mcg tablet . Total dose is 75 mcg.      . ranitidine (ZANTAC) 150 MG tablet Take 150 mg by mouth 2 (two) times daily.      . simvastatin (ZOCOR) 20 MG tablet Take 30 mg by mouth every evening. Take one and a half tablet by mouth at bedtime      . Tamsulosin HCl (FLOMAX) 0.4 MG CAPS Take 0.4 mg by mouth daily after supper.      . vitamin B-12 (CYANOCOBALAMIN) 1000 MCG tablet Take 1,000 mcg by mouth daily.      . vitamin C (ASCORBIC ACID)  500 MG tablet Take 500 mg by mouth 2 (two) times daily.        Home: Home Living Lives With: Spouse Available Help at Discharge: Family Type of Home: House Home Access: Stairs to enter [employer] of Steps: 2 Entrance Stairs-Rails: Right;Left;Can reach both Home Layout: One level Bathroom Shower/Tub: Engineer, manufacturing systems: Standard Home Adaptive Equipment: Walker - rolling;Tub  transfer bench;Bedside commode/3-in-1  Functional History: Prior Function Able to Take Stairs?: Yes Functional Status:  Mobility: Bed Mobility Bed Mobility: Supine to Sit Supine to Sit: 4: Min assist;HOB elevated;With rails Sitting - Scoot to Edge of Bed: 4: Min assist;With rail Transfers Transfers: Sit to Stand;Stand to [employer] Transfers Sit to Stand: 1: +2 Total assist;From elevated surface Sit to Stand: Patient Percentage: 60% Stand to Sit: 4: Min assist;To chair/3-in-1;With upper extremity assist Stand to Sit: Patient Percentage: 50% Stand Pivot Transfers: 1: +2 Total assist Stand Pivot Transfers: Patient Percentage: 60% Ambulation/Gait Ambulation/Gait Assistance: 1: +2 Total assist Ambulation/Gait: Patient Percentage: 60% Ambulation Distance (Feet): 5 Feet Assistive device: Rolling walker Gait velocity: decreased    ADL: ADL Eating/Feeding: NPO Grooming: Simulated;Set up Where Assessed - Grooming: Supported sitting Upper Body Bathing: Simulated;Set up Where Assessed - Upper Body Bathing: Supported sitting Lower Body Bathing: Simulated;+2 Total assistance Where Assessed - Lower Body Bathing: Supported sit to stand Upper Body Dressing: Performed;Set up Where Assessed - Upper Body Dressing: Unsupported sitting Lower Body Dressing: Simulated;+2 Total assistance Where Assessed - Lower Body Dressing: Supported sit to stand Toilet Transfer: Simulated;+2 Total assistance Toilet Transfer Method: Surveyor, minerals: Other (comment) (to recliner) Equipment Used: Rolling walker ADL Comments: Pt only agreeable to bed>chair transfer. Had just refused a bath from nurse techs.  Cognition: Cognition Arousal/Alertness: Awake/alert Orientation Level: Oriented X4 Cognition Overall Cognitive Status: No family/caregiver present to determine baseline cognitive functioning Arousal/Alertness: Awake/alert Orientation Level: Appears intact for tasks  assessed Behavior During Session: [facility] for tasks performed Cognition - Other Comments: oriented to month, day of week, and year  Blood pressure 128/53, pulse 70, temperature 97.7 F (36.5 C), temperature source Oral, resp. rate 19, height 6' (1.829 m), weight 75 kg (165 lb 5.5 oz), SpO2 90.00%. Physical Exam  Vitals reviewed. HENT:  Head: Normocephalic.       NGT in place. Missing many teeth. Mucosa pink  Eyes:       Pupils round and reactive to light  Neck: Neck supple. No thyromegaly present.  Cardiovascular: Regular rhythm.   Pulmonary/Chest: Breath sounds normal. No respiratory distress. He has no wheezes.  Abdominal: Bowel sounds are normal. He exhibits no distension.  Neurological: He is alert.       Patient is a poor historian. He was able to give his date of birth and age. He could name the hospital with multiple cues. He would follow simple commands. Poor insight and awareness. He had difficulty controlling his secretions and a weak cough. Strength was symmetrical but he was weak more proximally than distally. No focal sensory loss was discerned.   Skin:       Skin was not examined  Psychiatric:       Patient with poor awareness of his deficits    Results for orders placed during the hospital encounter of 04/04/12 (from the past 24 hour(s))  GLUCOSE, CAPILLARY     Status: Abnormal   Collection Time   04/22/12 11:29 AM      Component Value Range   Glucose-Capillary 331 (*) 70 - 99 mg/dL  Comment 1 Documented in Chart     Comment 2 Notify RN    GLUCOSE, CAPILLARY     Status: Abnormal   Collection Time   04/22/12  3:35 PM      Component Value Range   Glucose-Capillary 219 (*) 70 - 99 mg/dL   Comment 1 Documented in Chart     Comment 2 Notify RN    GLUCOSE, CAPILLARY     Status: Abnormal   Collection Time   04/22/12  7:58 PM      Component Value Range   Glucose-Capillary 220 (*) 70 - 99 mg/dL  GLUCOSE, CAPILLARY     Status: Abnormal   Collection Time   04/22/12 11:41  PM      Component Value Range   Glucose-Capillary 194 (*) 70 - 99 mg/dL   Comment 1 Notify RN     Comment 2 Documented in Chart    GLUCOSE, CAPILLARY     Status: Abnormal   Collection Time   04/23/12  1:08 AM      Component Value Range   Glucose-Capillary 188 (*) 70 - 99 mg/dL  CBC     Status: Abnormal   Collection Time   04/23/12  3:28 AM      Component Value Range   WBC 24.6 (*) 4.0 - 10.5 K/uL   RBC 2.47 (*) 4.22 - 5.81 MIL/uL   Hemoglobin 7.9 (*) 13.0 - 17.0 g/dL   HCT 16.1 (*) 09.6 - 04.5 %   MCV 102.8 (*) 78.0 - 100.0 fL   MCH 32.0  26.0 - 34.0 pg   MCHC 31.1  30.0 - 36.0 g/dL   RDW 40.9 (*) 81.1 - 91.4 %   Platelets 450 (*) 150 - 400 K/uL  BASIC METABOLIC PANEL     Status: Abnormal   Collection Time   04/23/12  3:28 AM      Component Value Range   Sodium 141  135 - 145 mEq/L   Potassium 4.4  3.5 - 5.1 mEq/L   Chloride 105  96 - 112 mEq/L   CO2 33 (*) 19 - 32 mEq/L   Glucose, Bld 214 (*) 70 - 99 mg/dL   BUN 38 (*) 6 - 23 mg/dL   Creatinine, Ser 7.82  0.50 - 1.35 mg/dL   Calcium 8.3 (*) 8.4 - 10.5 mg/dL   GFR calc non Af Amer 84 (*) >90 mL/min   GFR calc Af Amer >90  >90 mL/min  GLUCOSE, CAPILLARY     Status: Abnormal   Collection Time   04/23/12  3:36 AM      Component Value Range   Glucose-Capillary 186 (*) 70 - 99 mg/dL  GLUCOSE, CAPILLARY     Status: Abnormal   Collection Time   04/23/12  7:39 AM      Component Value Range   Glucose-Capillary 186 (*) 70 - 99 mg/dL   Comment 1 Documented in Chart     Comment 2 Notify RN     Dg Abd 1 View  04/22/2012  *RADIOLOGY REPORT*  Clinical Data: Enteric tube placement.  ABDOMEN - 1 VIEW  Comparison: 04/20/2012  Findings: Since the previous study, the enteric tube has been advanced.  The tip is now coiled in the left upper quadrant, likely in the upper stomach.  No evidence of bowel distension.  Fibrosis or infiltration in the lungs.  IMPRESSION: Enteric tube is coiled in the left upper quadrant with tip directed towards the  upper stomach region.  Original  Report Authenticated By: Marlon Pel, M.D.   Dg Chest Port 1 View  04/22/2012  *RADIOLOGY REPORT*  Clinical Data: Follow-up infiltrates.  PORTABLE CHEST - 1 VIEW  Comparison: 04/21/2012.  Findings: Shallow inspiration.  Mild cardiac enlargement.  The probable pulmonary vascular congestion.  Diffuse bilateral airspace and interstitial disease throughout both lungs, similar to previous study.  Changes may represent edema, pneumonia, or ARDS.  No blunting of costophrenic angles.  No pneumothorax.  Mediastinal contours appear intact.  Enteric tube tip is in the left upper quadrant consistent with location in the stomach.  IMPRESSION: Diffuse bilateral airspace and interstitial infiltrates appear stable since previous study.  Original Report Authenticated By: Marlon Pel, M.D.    Assessment/Plan: Diagnosis: deconditioning related to sepsis and multiple medical complications/ metabolic encephalopathy 1. Does the need for close, 24 hr/day medical supervision in concert with the patient's rehab needs make it unreasonable for this patient to be served in a less intensive setting? Yes 2. Co-Morbidities requiring supervision/potential complications: htn, dm, anemia, wound care, MI 3. Due to bladder management, bowel management, safety, skin/wound care, disease management, medication administration, pain management and patient education, does the patient require 24 hr/day rehab nursing? Yes 4. Does the patient require coordinated care of a physician, rehab nurse, PT (1-2 hrs/day, 5 days/week), OT (1-2 hrs/day, 5 days/week) and SLP (1-2 hrs/day, 5 days/week) to address physical and functional deficits in the context of the above medical diagnosis(es)? Yes Addressing deficits in the following areas: balance, endurance, locomotion, strength, transferring, bowel/bladder control, bathing, dressing, feeding, grooming, toileting, cognition, speech, language, swallowing and  psychosocial support 5. Can the patient actively participate in an intensive therapy program of at least 3 hrs of therapy per day at least 5 days per week? Yes 6. The potential for patient to make measurable gains while on inpatient rehab is good 7. Anticipated functional outcomes upon discharge from inpatient rehab are supervision to minimal assist with PT, supervision to minimal assist with OT, supervision to minimal assist with SLP. 8. Estimated rehab length of stay to reach the above functional goals is: 2-3 weeks 9. Does the patient have adequate social supports to accommodate these discharge functional goals? Potentially 10. Anticipated D/C setting: Home 11. Anticipated post D/C treatments: HH therapy 12. Overall Rehab/Functional Prognosis: excellent  RECOMMENDATIONS: This patient's condition is appropriate for continued rehabilitative care in the following setting: CIR Patient has agreed to participate in recommended program. Yes Note that insurance prior authorization may be required for reimbursement for recommended care.  Comment: Rehab RN to follow up.   Ivory Broad, MD     04/23/2012

## 2012-04-23 NOTE — Progress Notes (Signed)
Patient self removed panda feeding tube.  Patient alert and oriented.  Notified Tyson Alias; panda tube to stay out and tube feed to remain off the remainder of the night.  Daryl Eastern, RN

## 2012-04-23 NOTE — Procedures (Signed)
Objective Swallowing Evaluation: Modified Barium Swallowing Study  Patient Details  Name: [name] MRN: [id number] Date of Birth: [birth year]  Today's Date: 04/23/2012 Time: 6301-6010 SLP Time Calculation (min): 39 min  Past Medical History:  Past Medical History  Diagnosis Date  . Diabetes mellitus   . Cancer   . Thyroid disease   . Anemia   . Hyperlipidemia   . Neuropathy, diabetic   . Diabetic retinopathy    Past Surgical History:  Past Surgical History  Procedure Date  . Cataract extraction, bilateral   . Colonoscopy w/ polypectomy    HPI:  78 yo diabetic sent to [facility] ED with nausea, vomiting and altered mental status.  Apparently was withholding insulin as he did not feel well for several weeks.  Found to be hyperglycemic / acidotic.  Patient required  intubation on 04-05-12 and was just extubated this morning 04-18-12.  Pt has remained with Panda since extubation until last pm when he pulled it out.  Order for clinical bedside received, SLP phoned NP for [facility] and received order to proceed with MBS.      Assessment / Plan / Recommendation Clinical Impression  Dysphagia Diagnosis: Moderate pharyngeal phase dysphagia;Moderate oral phase dysphagia;Moderate cervical esophageal phase dysphagia;Suspected primary esophageal dysphagia Clinical impression: Multifactorial dysphagia noted with oropharyngeal, cervical esophageal and suspected esophageal deficits.  Delayed oral transiting - lingual pumping noted- with oral stasis that prematurely spills into pharynx without pt awareness.  Pt with decr opening of UES resulting in stasis of liquids (without pt awareness) that spills into posterior trachea .  Larger bolus amounts of liquids appear to aid UES opening - likely due to larger weight.  Various postures (*head turn, right/left, chin tuck).   Pt tracely aspirated thin and nectar both during and after the swallow both due to decr laryngeal elevation/closure and after from shallow pyriform  sinus stasis that spills into posterior trach.  Pharyngeal stasis of applesauce and cracker is worse that with liquids due to weakness.  Pt did cough and clear his throat throughout entire MBS and aspiration of secretions was noted.    Per conversation with pt today, pt has been having water go down the wrong way for several years but he thought it was "just his fault".   He recalls having to be "smacked on the back" to help him to clear.  Based on interview with pt, suspect this dysphagia has been present for several years and now that his functional reserve is compromised and he is deconditioned, his dysphagia is exacerbated.  Pt states he does not desire long term feeding tube but is willing to have Panda short term to see if he can strengthen enough to tolerate suspected chronic aspiration.      Treatment Recommendation       Diet Recommendation Alternative means - temporary  Rec pt be allowed straw boluses of thin water (after oral care) with SLP only to decrease disuse muscle atrophy.        Other  Recommendations Recommended Consults: Consider esophageal assessment   Follow Up Recommendations       Frequency and Duration min 2x/week  2 weeks   Pertinent Vitals/Pain Afebrile, decreased, wet voice at times, oral suction at bedside    SLP Swallow Goals Swallow Study Goal #3 - Progress: Met Goal #4: Pt will conduct cervical esophageal exercise to improve UES opening subsequently decreasing pharyngeal stasis and increasing swallow safety with max verbal assist.    General HPI: 78 yo diabetic sent to  [facility] ED with nausea, vomiting and altered mental status.  Apparently was withholding insulin as he did not feel well for several weeks.  Found to be hyperglycemic / acidotic.  Patient required  intubation on 04-05-12 and was just extubated this morning 04-18-12.  Pt has remained with Panda since extubation until last pm when he pulled it out.  Order for clinical bedside received, SLP phoned NP for  [facility] and received order to proceed with MBS.  Type of Study: Modified Barium Swallowing Study Reason for Referral: Objectively evaluate swallowing function Previous Swallow Assessment: followed clinically with recommendations of continuing npo and proceed with instrumental eval when clinically ready Diet Prior to this Study: NPO Temperature Spikes Noted: No Respiratory Status: Supplemental O2 delivered via (comment) (3 liters) History of Recent Intubation: Yes Length of Intubations (days): 13 days Date extubated: 04/18/12 Behavior/Cognition: Alert;Cooperative;Pleasant mood;Hard of hearing Oral Cavity - Dentition: Missing dentition Oral Motor / Sensory Function: Impaired - see Bedside swallow eval Self-Feeding Abilities: Needs assist Patient Positioning: Upright in chair Baseline Vocal Quality: Hoarse;Wet;Low vocal intensity Volitional Cough: Strong Volitional Swallow: Able to elicit Pharyngeal Secretions: Not observed secondary MBS    Reason for Referral Objectively evaluate swallowing function   Oral Phase   Oral Phase: Impaired  Pharyngeal Phase Pharyngeal Phase: Impaired   Cervical Esophageal Phase    GO    Cervical Esophageal Phase: Impaired    Donavan Burnet, MS [facility] SLP [phone]

## 2012-04-23 NOTE — Progress Notes (Signed)
Name: [name] MRN: [id number] DOB: 06/21/1929    LOS: 19  Referring Provider:  EDP Reason for Referral:  DKA  PULMONARY / CRITICAL CARE MEDICINE    Brief patient description:  78 yo diabetic admit from home via [facility] ED with nausea, vomiting and altered mental status. Treated for DKA but course also significant for sepsis due to skin source vs PNA, NSTEMI with shock (septic + cardiogenic), resp failure.   Events: 8/17 Transfer back to ICU for hypoxia>>?displacement of panda and aspiration of tube feeds 8/20 pulled panda out, failed swallow 8/19  Current Status: much better.  Breathing improved.   Alert, denies pain Wanting 'something to eat' 8/20 pulled panda out, failed swallow 8/19, lantus given, other insulin held -CBGs ok  Vital Signs: Temp:  [97.7 F (36.5 C)-98.5 F (36.9 C)] 97.7 F (36.5 C) (08/20 0800) Pulse Rate:  [53-72] 70  (08/20 0800) Resp:  [10-22] 19  (08/20 0800) BP: (115-145)/(52-68) 128/53 mmHg (08/20 0800) SpO2:  [90 %-100 %] 90 % (08/20 0800) Weight:  [75 kg (165 lb 5.5 oz)] 75 kg (165 lb 5.5 oz) (08/20 0200)    Intake/Output Summary (Last 24 hours) at 04/23/12 0901 Last data filed at 04/23/12 0400  Gross per 24 hour  Intake   1420 ml  Output    950 ml  Net    470 ml  NRB  Physical Examination: General: No distress Neuro: Alert, interactive HEENT: No sinus tenderness Neck:  Supple Cardiovascular: s1s2 regular, no murmur Lungs: Scattered rhonchi, no wheeze Abdomen:  Soft, non tender, bowel sounds present  Musculoskeletal: no edema Skin:  R foot ulcer, sacral decubitus ulcer  Dg Abd 1 View  04/22/2012  *RADIOLOGY REPORT*  Clinical Data: Enteric tube placement.  ABDOMEN - 1 VIEW  Comparison: 04/20/2012  Findings: Since the previous study, the enteric tube has been advanced.  The tip is now coiled in the left upper quadrant, likely in the upper stomach.  No evidence of bowel distension.  Fibrosis or infiltration in the lungs.  IMPRESSION:  Enteric tube is coiled in the left upper quadrant with tip directed towards the upper stomach region.  Original Report Authenticated By: Marlon Pel, M.D.   Dg Chest Port 1 View  04/22/2012  *RADIOLOGY REPORT*  Clinical Data: Follow-up infiltrates.  PORTABLE CHEST - 1 VIEW  Comparison: 04/21/2012.  Findings: Shallow inspiration.  Mild cardiac enlargement.  The probable pulmonary vascular congestion.  Diffuse bilateral airspace and interstitial disease throughout both lungs, similar to previous study.  Changes may represent edema, pneumonia, or ARDS.  No blunting of costophrenic angles.  No pneumothorax.  Mediastinal contours appear intact.  Enteric tube tip is in the left upper quadrant consistent with location in the stomach.  IMPRESSION: Diffuse bilateral airspace and interstitial infiltrates appear stable since previous study.  Original Report Authenticated By: Marlon Pel, M.D.    ASSESSMENT AND PLAN  PULMONARY  ABG    Component Value Date/Time   PHART 7.366 04/20/2012 0624   PCO2ART 57.1* 04/20/2012 0624   PO2ART 67.5* 04/20/2012 0624   HCO3 31.9* 04/20/2012 0624   TCO2 30.1 04/20/2012 0624   ACIDBASEDEF 3.5* 04/09/2012 0400   O2SAT 90.7 04/20/2012 0624   ETT:  8/2>>>8/4 ETT:  8/5>>>8/15  A:  Acute respiratory failure in setting of septic shock skin and DKA.  Persistent pulmonary infiltrates likely from fibro-proliferative ARDS >>Improved after adding steroids 8/13.  Worsening hypoxia 8/17 with ?aspiration of tube feeds>>improved 8/18. ESR 131 from 8/13 >> 112 (  8/19) P:    PO pred 40 bid, rpt ESR remains high F/u CXR intermittently q 2-3 ds Titrate oxygen to keep SpO2 > 90% D/c BiPAP Keep in even fluid balance  CARDIOVASCULAR  Lab 04/16/12 1020  TROPONINI --  LATICACIDVEN --  PROBNP 2493.0*     Lines: CVL  L [place] 8/2>>8/14  Echo 8/02>>EF 40 to 45%, diffuse hypokinesis, grade 1 diastolic dysfx, mod RV systolic dysfx  A:  Septic shock>>resolved. NSTEMI and  superimposed cardiogenic shock>>shock resolved. Acute combined CHF with acute pulmonary edema>>improved. Cardiology signed off 8/07>>recommended medical management. P:  Restarted ASA 8/13 (held due to concern for GI bleeding) Continue lopressor  RENAL  Lab 04/23/12 0328 04/22/12 0330 04/21/12 0401 04/20/12 0851 04/19/12 0323  NA 141 145 147* 149* 148*  K 4.4 4.4 -- -- --  CL 105 107 107 109 108  CO2 33* 34* 35* 33* 34*  BUN 38* 47* 51* 52* 64*  CREATININE 0.75 0.80 0.84 0.92 0.96  CALCIUM 8.3* 8.3* 8.4 8.5 8.3*  MG -- -- -- -- --  PHOS -- -- -- -- --   Intake/Output      08/19 0701 - 08/20 0700 08/20 0701 - 08/21 0700   I.V. (mL/kg) 360 (4.8)    Other 300    NG/GT 890    Total Intake(mL/kg) 1550 (20.7)    Urine (mL/kg/hr) 950 (0.5)    Total Output 950    Net +600         Urine Occurrence 1 x     Foley:  8/1 >>>  A:  Mild hypernatremia -resolved P:  F/u BMET, ct free water flushes  GASTROINTESTINAL  Lab 04/20/12 0851 04/19/12 0323 04/17/12 2353  AST 25 21 20   ALT 14 11 11   ALKPHOS 92 89 111  BILITOT 0.2* 0.2* 0.3  PROT 6.4 6.1 6.0  ALBUMIN 1.6* 1.6* 1.4*   A: ileus >>resolved.   Diarrhea (C diff negative 8/12)>>improved. Nutrition, protein calorie malnutrition. Dysphagia P:  NPO Recheck swallow per SLP 8/20 now that panda out - if fails again, will need reinsertion  HEMATOLOGIC  Lab 04/23/12 0328 04/22/12 0330 04/21/12 0401 04/20/12 0747 04/19/12 0323  HGB 7.9* 7.9* 9.1* 10.4* 8.2*  HCT 25.4* 25.1* 29.4* 33.2* 26.3*  PLT 450* 442* 431* 338 329  INR -- -- -- -- --  APTT -- -- -- -- --   A:  Anemia of critical illness, chronic disease, and iron deficiency.  Required 1u PRBC 8/9. P:  F/u CBC intermittently Added FeSO4 8/14 Transfuse for Hb < 7   INFECTIOUS  Lab 04/23/12 0328 04/22/12 0330 04/21/12 0401 04/20/12 0747 04/19/12 0323  WBC 24.6* 23.2* 23.9* 19.3* 22.0*  PROCALCITON -- -- -- -- --   Cultures: 8/1 Blood >>> negative 8/1 MRSA  screen: negative   Antibiotics: Vancomycin 8/1 (sepsis , skin source)>>> 8/8 Zosyn 8/1 (sepsis, skin source)>>> 8/12  A:  Possible infected diabetic ulcer / sacral decub ulcer.  Possible aspiration tube feeds 8/17>>no evidence for infection. Leukocytosis>>likely from steroids. P:   F/U CBC Monitor off Abx   ENDOCRINE  Lab 04/23/12 0739 04/23/12 0336 04/23/12 0108 04/22/12 2341 04/22/12 1958  GLUCAP 186* 186* 188* 194* 220*   A:  DM.  DKA secondary to noncompliance (history of noncompliance).   Hypothyroidism. Hypoglycemic event 8/17. P:   Synthroid Increased Lantus to 15 and ssi +TF coverage >> all held now that tfs held Diabetes coordinator assisting  NEUROLOGIC  Head CT:  8/1 >>> nad  A:  Acute encephalopathy, metabolic and due to sepsis>>resolved. P: PRN analgesia PT following   BEST PRACTICE / DISPOSITION Level of Care:  SDU Primary Service:  PCCM Consultants:  None Code Status:  Limited >> no CPR/defib Diet: Tube feeds DVT Px:  SQ heparin GI Px:  PPI Skin Integrity:  As above - per  wound care consult Social / Family: No family at bedside  He was being evaluated for transfer to [facility]. Now that respiratory status improved, ready 8/20 for transfer, CIR input as alternative discharge venue   Cyril Mourning MD. FCCP. [facility] Pager [phone] If no response call [phone]   04/23/2012, 9:01 AM

## 2012-04-24 ENCOUNTER — Telehealth: Payer: Self-pay | Admitting: Pulmonary Disease

## 2012-04-24 LAB — GLUCOSE, CAPILLARY
Glucose-Capillary: 110 mg/dL — ABNORMAL HIGH (ref 70–99)
Glucose-Capillary: 121 mg/dL — ABNORMAL HIGH (ref 70–99)
Glucose-Capillary: 179 mg/dL — ABNORMAL HIGH (ref 70–99)
Glucose-Capillary: 201 mg/dL — ABNORMAL HIGH (ref 70–99)
Glucose-Capillary: 214 mg/dL — ABNORMAL HIGH (ref 70–99)
Glucose-Capillary: 273 mg/dL — ABNORMAL HIGH (ref 70–99)
Glucose-Capillary: 280 mg/dL — ABNORMAL HIGH (ref 70–99)
Glucose-Capillary: 284 mg/dL — ABNORMAL HIGH (ref 70–99)

## 2012-04-24 LAB — BASIC METABOLIC PANEL
CO2: 31 mEq/L (ref 19–32)
Calcium: 8.2 mg/dL — ABNORMAL LOW (ref 8.4–10.5)
Chloride: 101 mEq/L (ref 96–112)
GFR calc Af Amer: >90 mL/min (ref >90)
Sodium: 138 mEq/L (ref 135–145)

## 2012-04-24 LAB — CBC
HCT: 25.9 % — ABNORMAL LOW (ref 39.0–52.0)
MCH: 32.1 pg (ref 26.0–34.0)
MCV: 102.8 fL — ABNORMAL HIGH (ref 78.0–100.0)
Platelets: 478 10*3/uL — ABNORMAL HIGH (ref 150–400)
RDW: 16.1 % — ABNORMAL HIGH (ref 11.5–15.5)

## 2012-04-24 MED ORDER — FREE WATER
100.0000 mL | Freq: Three times a day (TID) | Status: DC
  Administered 2012-04-24 – 2012-04-26 (×4): 100 mL

## 2012-04-24 NOTE — Progress Notes (Signed)
Name: [name] MRN: [id number] DOB: May 23, 1929    LOS: 20  Referring Provider:  EDP Reason for Referral:  DKA  PULMONARY / CRITICAL CARE MEDICINE    Brief patient description:  78 yo diabetic admit from home via [facility] ED with nausea, vomiting and altered mental status. Treated for DKA but course also significant for sepsis due to skin source vs PNA, NSTEMI with shock (septic + cardiogenic), resp failure.   Events: 8/17 Transfer back to ICU for hypoxia>>?displacement of panda and aspiration of tube feeds 8/20 pulled panda out, failed swallow,   Current Status:  Breathing improved.   Alert, denies pain    Vital Signs: Temp:  [97.3 F (36.3 C)-98.2 F (36.8 C)] 97.4 F (36.3 C) (08/21 0702) Pulse Rate:  [56-62] 62  (08/21 0702) Resp:  [15-18] 18  (08/21 0702) BP: (110-144)/(53-72) 131/67 mmHg (08/21 0702) SpO2:  [95 %-100 %] 99 % (08/21 0702) Weight:  [141 lb 8 oz (64.184 kg)] 141 lb 8 oz (64.184 kg) (08/21 0404)    Intake/Output Summary (Last 24 hours) at 04/24/12 0932 Last data filed at 04/24/12 0847  Gross per 24 hour  Intake    755 ml  Output   1300 ml  Net   -545 ml  NRB  Physical Examination: General: No distress Neuro: Alert, interactive HEENT: No sinus tenderness Neck:  Supple Cardiovascular: s1s2 regular, no murmur Lungs: Scattered rhonchi, no wheeze Abdomen:  Soft, non tender, bowel sounds present  Musculoskeletal: no edema Skin:  R foot ulcer, sacral decubitus ulcer  Dg Abd 1 View  04/23/2012  *RADIOLOGY REPORT*  Clinical Data: Feeding tube placement.  ABDOMEN - 1 VIEW  Comparison: 04/22/2012  Findings: Feeding tube tip is in the midportion of the stomach. Contrast within nondistended small bowel.  No evidence of bowel obstruction or free air.  IMPRESSION: Feeding tube tip in the mid portion of the stomach.   Original Report Authenticated By: Cyndie Chime, M.D.    Dg Swallowing Func-no Report  04/23/2012  CLINICAL DATA: dysphagia   FLUOROSCOPY FOR  SWALLOWING FUNCTION STUDY:  Fluoroscopy was provided for swallowing function study, which was  administered by a speech pathologist.  Final results and recommendations  from this study are contained within the speech pathology report.      ASSESSMENT AND PLAN  PULMONARY  ABG    Component Value Date/Time   PHART 7.366 04/20/2012 0624   PCO2ART 57.1* 04/20/2012 0624   PO2ART 67.5* 04/20/2012 0624   HCO3 31.9* 04/20/2012 0624   TCO2 30.1 04/20/2012 0624   ACIDBASEDEF 3.5* 04/09/2012 0400   O2SAT 90.7 04/20/2012 0624   ETT:  8/2>>>8/4 ETT:  8/5>>>8/15  A:  Acute respiratory failure in setting of septic shock skin and DKA.  Persistent pulmonary infiltrates likely from fibro-proliferative ARDS >>Improved after adding steroids 8/13.  Worsening hypoxia 8/17 with ?aspiration of tube feeds>>improved 8/18. ESR 131 from 8/13 >> 112 (8/19) P:    PO pred 40 bid, rpt ESR remains high F/u CXR intermittently q 2-3 ds Titrate oxygen to keep SpO2 > 90% Keep in even fluid balance  CARDIOVASCULAR No results found for this basename: TROPONINI:5,LATICACIDVEN:5, O2SATVEN:5,PROBNP:5 in the last 168 hours   Lines: CVL  L [place] 8/2>>8/14  Echo 8/02>>EF 40 to 45%, diffuse hypokinesis, grade 1 diastolic dysfx, mod RV systolic dysfx  A:  Septic shock>>resolved. NSTEMI and superimposed cardiogenic shock>>shock resolved. Acute combined CHF with acute pulmonary edema>>improved. Cardiology signed off 8/07>>recommended medical management. P:  Restarted ASA 8/13 (held  due to concern for GI bleeding) Continue lopressor  RENAL  Lab 04/24/12 0353 04/23/12 0328 04/22/12 0330 04/21/12 0401 04/20/12 0851  NA 138 141 145 147* 149*  K 4.5 4.4 -- -- --  CL 101 105 107 107 109  CO2 31 33* 34* 35* 33*  BUN 32* 38* 47* 51* 52*  CREATININE 0.74 0.75 0.80 0.84 0.92  CALCIUM 8.2* 8.3* 8.3* 8.4 8.5  MG -- -- -- -- --  PHOS -- -- -- -- --   Intake/Output      08/20 0701 - 08/21 0700 08/21 0701 - 08/22 0700   I.V. (mL/kg)  460 (7.2)    Other 200    NG/GT 45 90   Total Intake(mL/kg) 705 (11) 90 (1.4)   Urine (mL/kg/hr) 1300 (0.8)    Total Output 1300 0   Net -595 +90         Foley:  8/1 >>>  A:  Mild hypernatremia -resolved P:  F/u BMET, decrease  free water flushes  GASTROINTESTINAL  Lab 04/20/12 0851 04/19/12 0323 04/17/12 2353  AST 25 21 20   ALT 14 11 11   ALKPHOS 92 89 111  BILITOT 0.2* 0.2* 0.3  PROT 6.4 6.1 6.0  ALBUMIN 1.6* 1.6* 1.4*   A: ileus >>resolved.   Diarrhea (C diff negative 8/12)>>improved. Nutrition, protein calorie malnutrition. Dysphagia P:  NPO 8/21 after GI evaluation per Dr. Russella Dar 8/20 tube feeds restarted. He will need EGD to rule out esophageal problems. 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  Stable from cardiac & resp standpt to undergo procedure WIll help to define placement -LTAC vs SNF - does he need PEG since dysphagia appears to be long standing  HEMATOLOGIC  Lab 04/24/12 0353 04/23/12 0328 04/22/12 0330 04/21/12 0401 04/20/12 0747  HGB 8.1* 7.9* 7.9* 9.1* 10.4*  HCT 25.9* 25.4* 25.1* 29.4* 33.2*  PLT 478* 450* 442* 431* 338  INR -- -- -- -- --  APTT -- -- -- -- --   A:  Anemia of critical illness, chronic disease, and iron deficiency.  Required 1u PRBC 8/9. P:  F/u CBC intermittently Added FeSO4 8/14 Transfuse for Hb < 7   INFECTIOUS  Lab 04/24/12 0353 04/23/12 0328 04/22/12 0330 04/21/12 0401 04/20/12 0747  WBC 18.1* 24.6* 23.2* 23.9* 19.3*  PROCALCITON -- -- -- -- --   Cultures: 8/1 Blood >>> negative 8/1 MRSA screen: negative   Antibiotics: Vancomycin 8/1 (sepsis , skin source)>>> 8/8 Zosyn 8/1 (sepsis, skin source)>>> 8/12  A:  Possible infected diabetic ulcer / sacral decub ulcer.  Possible aspiration tube feeds 8/17>>no evidence for infection. Leukocytosis>>likely from steroids. P:   F/U CBC Monitor off Abx   ENDOCRINE  Lab 04/24/12 0723 04/24/12 0426 04/24/12 0011 04/23/12 1950  04/23/12 1637  GLUCAP 110* 201* 273* 207* 177*   A:  DM.  DKA secondary to noncompliance (history of noncompliance).   Hypothyroidism. Hypoglycemic event 8/17. P:   Synthroid Increased Lantus to 15 and ssi +TF coverage  Diabetes coordinator assisting  NEUROLOGIC  Head CT:  8/1 >>> nad  A:  Acute encephalopathy, metabolic and due to sepsis>>resolved. P: PRN analgesia PT following   BEST PRACTICE / DISPOSITION Level of Care:  floor Primary Service:  PCCM Consultants:  None Code Status:  Limited >> no CPR/defib Diet: Tube feeds DVT Px:  SQ heparin GI Px:  PPI Skin Integrity:  As above - per  wound care consult Social / Family: No family  at bedside  He was being evaluated for transfer to [facility]. Now that respiratory status improved, ready 8/20 for transfer, CIR input as alternative discharge venue. 8/21 discussed with Dr Levonne Spiller from his insurance - they want to be sure that he will not be readmitted & alla cute problems resolved before approving LTAC - await GI plan , WC has decreased & Hb stable   [name] ACNP [name] PCCM Pager [phone] till 3 pm If no answer page [phone] 04/24/2012, 9:32 AM   Independently examined pt, evaluated data & formulated above care plan with NP, note edited  [name] V.  [phone]

## 2012-04-24 NOTE — Progress Notes (Signed)
I have taken an interval history, reviewed the chart and examined the patient. I agree with the extender's note, impression and recommendations.   Rhyse Loux T. Malana Eberwein MD FACG 

## 2012-04-24 NOTE — Progress Notes (Addendum)
Physical Therapy Treatment Patient Details Name: [name] MRN: [id number] DOB: 12-23-1928 Today's Date: 04/24/2012 Time: 6962-9528 PT Time Calculation (min): 13 min  PT Assessment / Plan / Recommendation Comments on Treatment Session  Continue to recommend rehab.     Follow Up Recommendations  LTACH/Skilled nursing facility vs CIR (if possible);Supervision/Assistance - 24 hour    Barriers to Discharge        Equipment Recommendations  Defer to next venue    Recommendations for Other Services    Frequency Min 3X/week   Plan Discharge plan remains appropriate    Precautions / Restrictions Precautions Precautions: Fall Restrictions Weight Bearing Restrictions: No   Pertinent Vitals/Pain     Mobility  Bed Mobility Bed Mobility: Supine to Sit Supine to Sit: 4: Min assist;HOB elevated;With rails Sitting - Scoot to Edge of Bed: 4: Min assist Details for Bed Mobility Assistance: Increased time. VCs safety, technique, hand placement. Assist for LEs off bed and trunk to upirght. Utilzed bedpad for scootiing, positioning Transfers Transfers: Sit to Stand;Stand to Sit Sit to Stand: 1: +2 Total assist;With upper extremity assist;From bed;From elevated surface Sit to Stand: Patient Percentage: 80% Stand to Sit: 1: +2 Total assist;To chair/3-in-1;With upper extremity assist;With armrests Stand to Sit: Patient Percentage: 80% Details for Transfer Assistance: VCs safety, technique, hand placement. Assist to rise, stabilize, control descent.  Ambulation/Gait Ambulation/Gait Assistance: 1: +2 Total assist Ambulation/Gait: Patient Percentage: 70% Ambulation Distance (Feet): 5 Feet Assistive device: Rolling walker Ambulation/Gait Assistance Details: 5 steps forward, 5 steps backward. VCs safety, posture, distance from RW. Assist to stabilize and maneuver with RW. Pt declined to ambulate further due to fatigue.  Gait Pattern: Step-through pattern;Decreased stride length;Decreased step  length - right;Decreased step length - left    Exercises     PT Diagnosis:    PT Problem List:   PT Treatment Interventions:     PT Goals Acute Rehab PT Goals Pt will go Supine/Side to Sit: with supervision PT Goal: Supine/Side to Sit - Progress: Progressing toward goal Pt will go Sit to Stand: with supervision PT Goal: Sit to Stand - Progress: Progressing toward goal Pt will go Stand to Sit: with supervision PT Goal: Stand to Sit - Progress: Progressing toward goal Pt will Ambulate: 51 - 150 feet;with supervision;with rolling walker PT Goal: Ambulate - Progress: Progressing toward goal  Visit Information  Last PT Received On: 04/24/12 Assistance Needed: +2 (safety)    Subjective Data  Subjective: "i need to get to that chair" Patient Stated Goal: None stated   Cognition  Overall Cognitive Status: No family/caregiver present to determine baseline cognitive functioning Arousal/Alertness: Awake/alert Behavior During Session: [facility] for tasks performed    Balance     End of Session PT - End of Session Equipment Utilized During Treatment: Gait belt Activity Tolerance: Patient limited by fatigue Patient left: in chair;with call bell/phone within reach   GP     Rebeca Alert [facility] 04/24/2012, 3:08 PM [phone]

## 2012-04-24 NOTE — Progress Notes (Signed)
EGD tomorrow with light sedation to evaluate dysphagia and history of a gastric polyp. With further recovery from his acute illness his swallowing function may improve to the point where he can take PO adequately so I recommend waiting on gastrostomy tube placement. Plan for repeat speech path evaluation in several days.

## 2012-04-24 NOTE — Progress Notes (Signed)
[facility] Progress Note  Subjective:  Feels a little better today than yesterday.  Still with no complaints of belly pain for bowel issues.  Still receiving tube feeds.  Objective:  Vital signs in last 24 hours: Temp:  [97.3 F (36.3 C)-98.2 F (36.8 C)] 97.4 F (36.3 C) (08/21 0702) Pulse Rate:  [56-62] 62  (08/21 0702) Resp:  [15-18] 18  (08/21 0702) BP: (110-144)/(53-72) 131/67 mmHg (08/21 0702) SpO2:  [95 %-100 %] 99 % (08/21 0702) Weight:  [141 lb 8 oz (64.184 kg)] 141 lb 8 oz (64.184 kg) (08/21 0404) Last BM Date: 04/21/12 General:   Alert, thin and chronically ill-appearing pleasant, in NAD Heart:  Regular rate and rhythm; no murmurs Pulm:  Rales heard throughout B/L lung fields. Abdomen:  Soft, nontender and nondistended. Normal bowel sounds, without guarding, and without rebound.   Extremities:  Without edema.  Wounds with dressings on B/L lower extremities. Neurologic:  Alert and  oriented;  grossly normal neurologically. Psych:  Alert and cooperative. Normal mood and affect.  Intake/Output from previous day: 08/20 0701 - 08/21 0700 In: 705 [I.V.:460; NG/GT:45] Out: 1300 [Urine:1300] Intake/Output this shift: Total I/O In: 90 [NG/GT:90] Out: 0   Lab Results:  Basename 04/24/12 0353 04/23/12 0328 04/22/12 0330  WBC 18.1* 24.6* 23.2*  HGB 8.1* 7.9* 7.9*  HCT 25.9* 25.4* 25.1*  PLT 478* 450* 442*   BMET  Basename 04/24/12 0353 04/23/12 0328 04/22/12 0330  NA 138 141 145  K 4.5 4.4 4.4  CL 101 105 107  CO2 31 33* 34*  GLUCOSE 213* 214* 247*  BUN 32* 38* 47*  CREATININE 0.74 0.75 0.80  CALCIUM 8.2* 8.3* 8.3*   Assessment / Plan: -Multifactorial dysphagia seen on Barium Swallow study. Sounds mostly oropharyngeal/esophageal motility disorder by history.  Patient indicates that he was having problems with swallowing and choking for several years at home. Patient may have slight improvement in his swallowing as he recovers from his acute issues,  but it appears that this is going to be a chronic problem to some degree.  -s/p VDRF secondary to acute respiratory failure/ARDS  -DKA-resolved  -Septic shock with acute encephalopathy-resolved.  -NSTEMI-thought to be secondary to demand ischemia  -Anemia-Hgb stable.  Multifactorial secondary to critical illness, chronic disease, and profound iron deficiency; s/p 1 unit of PRBC's on 8/9 with no overt sign of GIB. On iron supplements as well.  Continue to monitor Hgb and transfuse prn. -Diabetic/sacral decubitus ulcers-WOC following patient  -Leukocytosis-thought to be seconday to steroids; off antibiotics now.  Improving. 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 Esophagram could also be considered, but obviously this is not optimal at this time either at this time due to need to give him barium liquid. We will follow along with Speech to see if there is any improvement in his swallowing as his overall condition improves and determine if there is indication to perform either of these studies at a later date. *Still working on obtaining any further past endoscopy records.   LOS: 20 days   [name] D.  04/24/2012, 9:36 AM  Pager number [phone]

## 2012-04-24 NOTE — Care Management Note (Signed)
    Page 1 of 2   04/26/2012     3:24:15 PM   CARE MANAGEMENT NOTE 04/26/2012  Patient:  [name],[name]   Account Number:  [id number]  Date Initiated:  04/05/2012  Documentation initiated by:  DAVIS,RHONDA  Subjective/Objective Assessment:   patient with known history of dka, not taking insulin presented with bld glucose over800, required intubation.     Action/Plan:   from home, very noncompliant with lifestyle and meds   Anticipated DC Date:  04/26/2012   Anticipated DC Plan:  IP REHAB FACILITY  In-house referral  NA      DC Planning Services  CM consult      PAC Choice  NA   Choice offered to / List presented to:  NA   DME arranged  NA      DME agency  NA     HH arranged  NA      HH agency  NA   Status of service:  Completed, signed off Medicare Important Message given?  NA - LOS <3 / Initial given by admissions (If response is "NO", the following Medicare IM given date fields will be blank) Date Medicare IM given:   Date Additional Medicare IM given:    Discharge Disposition:  IP REHAB FACILITY  Per UR Regulation:  Reviewed for med. necessity/level of care/duration of stay  If discussed at Long Length of Stay Meetings, dates discussed:   04/23/2012    Comments:  04/26/12 Vada Yellen RN,BSN NCM [phone] LTAC-NOT AUTH.INPATIENT REHAB-AUTH.MD/STAFF NURSE/DAUGHTER/SPOUSE UPDATED.COBRA FORM SIGNED ON CHART WILL NEED RECEIVING MD/RM/BED PRIOR TO CALLING [facility],& GIVING REPORT.TRANSFER FORM ON CHART.NURSE CALL REPORT TO [phone].  04/24/12 Trell Secrist RN,BSN NCM [phone] DR. ALVA HAD PEER TO PEER.[facility]) FOLLOWING FOR AUTH.SPOKE TO PATIENT/DAUGHTER-VIA PHONE ABOUT PERMISSION SIGNATURE FOR TRANSPORTATION TO [facility].THEY ARE IN AGREEMENT & WILL SIGN.FORMS ON CHART FOR PATIENT,& MD SIGNATURE.[facility] WILL PROVIDE TRANSP ONCE ACCEPTING MD,RM/BED GIVEN.NURSE WILL CALL [phone] WITH UPDATE ONCE D/C IN PLACE.  04/24/12 Ether Goebel RN,BSN NCM [phone] LEFT VM W/DR. ALVA OFFICE #[phone] SPOKE TO HOLLY(SCHEDULER) WHO WILL INFORM MD/NURSE ABOUT:AWAITNG RESPONSE FROM PEER TO PEER CONVERSATION W/INSURANCE CO MED DIRECTOR FOR AUTH FOR LTAC.WILL CONTINUE TO MONITOR PROGRESS,& AUTH FOR LTAC-[facility]) UPDATED.  [id number] Earlene Plater, RN, BSN, CCM: CASE MANAGEMENT [phone] apatient transfered back into icu [id number]  for resp, status and lethgary, cbg=89.  Awaits LTAC bed -awaiting insurance verification.   [id number] Earlene Plater, RN, BSN, CCM: CHART REVIEWED AND UPDATED. Referal for ltac placement done and rep. notified.  Patient was extubated this am , o2 wavering but holding. CASE MANAGEMENT [phone]    [id number]/[id number] Earlene Plater, RN, BSN, CCM: CHART REVIEWED AND UPDATED. NO DISCHARGE NEEDS PRESENT AT THIS TIME. CASE MANAGEMENT [phone]

## 2012-04-24 NOTE — Progress Notes (Signed)
Will plan for EGD tomorrow to evaluate dysphagia and rule out structural issues in the esophagus.  I spoke with Dr. Russella Dar who would like to hold off with any PEG tube placement for now.  He suggests that speech reevaluate him while he is at [facility] to see if swallowing function improves more from acute illness.

## 2012-04-24 NOTE — Telephone Encounter (Signed)
This is an inpatient. I provided Olegario Messier with Dr. Vassie Loll pager number so she could contact him directly. He is in [facility] this AM.Aliciana Ricciardi [place], [place]

## 2012-04-25 ENCOUNTER — Encounter (HOSPITAL_COMMUNITY): Payer: Self-pay | Admitting: Gastroenterology

## 2012-04-25 ENCOUNTER — Encounter (HOSPITAL_COMMUNITY)
Admission: EM | Disposition: A | Discharge status: Rehabilitation Facility | Payer: Self-pay | Source: Home / Self Care | Attending: Pulmonary Disease

## 2012-04-25 ENCOUNTER — Inpatient Hospital Stay (HOSPITAL_COMMUNITY): Payer: Medicare Other

## 2012-04-25 DIAGNOSIS — D131 Benign neoplasm of stomach: Secondary | ICD-10-CM

## 2012-04-25 HISTORY — PX: ESOPHAGOGASTRODUODENOSCOPY: SHX5428

## 2012-04-25 LAB — GLUCOSE, CAPILLARY
Glucose-Capillary: 214 mg/dL — ABNORMAL HIGH (ref 70–99)
Glucose-Capillary: 125 mg/dL — ABNORMAL HIGH (ref 70–99)
Glucose-Capillary: 130 mg/dL — ABNORMAL HIGH (ref 70–99)
Glucose-Capillary: 84 mg/dL (ref 70–99)
Glucose-Capillary: 119 mg/dL — ABNORMAL HIGH (ref 70–99)

## 2012-04-25 SURGERY — EGD (ESOPHAGOGASTRODUODENOSCOPY)
Anesthesia: Moderate Sedation

## 2012-04-25 MED ORDER — BUTAMBEN-TETRACAINE-BENZOCAINE 2-2-14 % EX AERO
INHALATION_SPRAY | CUTANEOUS | Status: DC | PRN
  Administered 2012-04-25: 2 via TOPICAL

## 2012-04-25 MED ORDER — FENTANYL CITRATE 0.05 MG/ML IJ SOLN
INTRAMUSCULAR | Status: DC | PRN
  Administered 2012-04-25: 10 ug via INTRAVENOUS
  Administered 2012-04-25: 15 ug via INTRAVENOUS

## 2012-04-25 MED ORDER — MIDAZOLAM HCL 10 MG/2ML IJ SOLN
INTRAMUSCULAR | Status: DC | PRN
  Administered 2012-04-25 (×3): 1 mg via INTRAVENOUS

## 2012-04-25 MED ORDER — FLUCONAZOLE IN SODIUM CHLORIDE 200-0.9 MG/100ML-% IV SOLN
200.0000 mg | Freq: Every day | INTRAVENOUS | Status: DC
  Administered 2012-04-25 – 2012-04-26 (×2): 200 mg via INTRAVENOUS
  Filled 2012-04-25 (×2): qty 100

## 2012-04-25 NOTE — Progress Notes (Signed)
Name: [name] MRN: [id number] DOB: 1929/01/15    LOS: 21  Referring Provider:  EDP Reason for Referral:  DKA  PULMONARY / CRITICAL CARE MEDICINE    Brief patient description:  78 yo diabetic admit from home via [facility] ED with nausea, vomiting and altered mental status. Treated for DKA but course also significant for sepsis due to skin source vs PNA, NSTEMI with shock (septic + cardiogenic), resp failure.   Events: 8/17 Transfer back to ICU for hypoxia>>?displacement of panda and aspiration of tube feeds 8/20 pulled panda out, failed swallow,  8/22 EGD  Current Status:  Breathing improved.   Alert, denies pain    Vital Signs: Temp:  [97.2 F (36.2 C)-98 F (36.7 C)] 98 F (36.7 C) (08/22 0751) Pulse Rate:  [63-68] 63  (08/22 0751) Resp:  [14-27] 19  (08/22 0920) BP: (115-147)/(55-74) 138/72 mmHg (08/22 0920) SpO2:  [84 %-98 %] 94 % (08/22 0920)    Intake/Output Summary (Last 24 hours) at 04/25/12 0931 Last data filed at 04/25/12 0500  Gross per 24 hour  Intake   1252 ml  Output    750 ml  Net    502 ml  NRB  Physical Examination: General: sedated Neuro: sedated HEENT: No sinus tenderness Neck:  Supple Cardiovascular: s1s2 regular, no murmur Lungs: congested cough, poor cough, rhonchi throughout  Abdomen:  Soft, non tender, bowel sounds present  Musculoskeletal: no edema Skin:  R foot ulcer, sacral decubitus ulcer  Dg Abd 1 View  04/23/2012  *RADIOLOGY REPORT*  Clinical Data: Feeding tube placement.  ABDOMEN - 1 VIEW  Comparison: 04/22/2012  Findings: Feeding tube tip is in the midportion of the stomach. Contrast within nondistended small bowel.  No evidence of bowel obstruction or free air.  IMPRESSION: Feeding tube tip in the mid portion of the stomach.   Original Report Authenticated By: Cyndie Chime, M.D.    Dg Chest Port 1 View  04/25/2012  *RADIOLOGY REPORT*  Clinical Data: Pneumonia.  Airspace disease.  Diabetes.  Prostate cancer.  PORTABLE CHEST - 1  VIEW  Comparison: 04/22/2012  Findings: Feeding tube extends down into the stomach.  Bilateral airspace and interstitial opacities are still present, although improved compared to the prior exam.  Confluence noted at the left lung base.  Heart size is within normal limits.  IMPRESSION:  1.  Bilateral interstitial and patchy airspace opacities, mildly improved from prior, potentially reflecting bilateral pneumonia or pulmonary edema.   Original Report Authenticated By: Dellia Cloud, M.D.     ASSESSMENT AND PLAN  PULMONARY  ABG    Component Value Date/Time   PHART 7.366 04/20/2012 0624   PCO2ART 57.1* 04/20/2012 0624   PO2ART 67.5* 04/20/2012 0624   HCO3 31.9* 04/20/2012 0624   TCO2 30.1 04/20/2012 0624   ACIDBASEDEF 3.5* 04/09/2012 0400   O2SAT 90.7 04/20/2012 0624   ETT:  8/2>>>8/4 ETT:  8/5>>>8/15  A:  Acute respiratory failure in setting of septic shock skin and DKA.  Persistent pulmonary infiltrates likely from fibro-proliferative ARDS >>Improved after adding steroids 8/13.  Worsening hypoxia 8/17 with ?aspiration of tube feeds>>improved 8/18. ESR 131 from 8/13 >> 112 (8/19) 8/22 post EGD very congested and desaturations noted P:    PO pred 40 bid, rpt ESR remains high F/u CXR intermittently q 2-3 ds Titrate oxygen to keep SpO2 > 90% Keep in even fluid balance Monitor closely post EGD -use venti mask as needed until fully recovered from sedation  CARDIOVASCULAR No results found for  this basename: TROPONINI:5,LATICACIDVEN:5, O2SATVEN:5,PROBNP:5 in the last 168 hours   Lines: CVL  [place] 8/2>>8/14  Echo 8/02>>EF 40 to 45%, diffuse hypokinesis, grade 1 diastolic dysfx, mod RV systolic dysfx  A:  Septic shock>>resolved. NSTEMI and superimposed cardiogenic shock>>shock resolved. Acute combined CHF with acute pulmonary edema>>improved. Cardiology signed off 8/07>>recommended medical management. P:  Restarted ASA 8/13 (held due to concern for GI bleeding) Continue  lopressor  RENAL  Lab 04/24/12 0353 04/23/12 0328 04/22/12 0330 04/21/12 0401 04/20/12 0851  NA 138 141 145 147* 149*  K 4.5 4.4 -- -- --  CL 101 105 107 107 109  CO2 31 33* 34* 35* 33*  BUN 32* 38* 47* 51* 52*  CREATININE 0.74 0.75 0.80 0.84 0.92  CALCIUM 8.2* 8.3* 8.3* 8.4 8.5  MG -- -- -- -- --  PHOS -- -- -- -- --   Intake/Output      08/21 0701 - 08/22 0700 08/22 0701 - 08/23 0700   I.V. (mL/kg) 501 (7.8)    Other     NG/GT 841    Total Intake(mL/kg) 1342 (20.9)    Urine (mL/kg/hr) 750 (0.5)    Total Output 750    Net +592          Foley:  8/1 >>>  A:  Mild hypernatremia -resolved P:  F/u BMET, decrease  free water flushes  GASTROINTESTINAL  Lab 04/20/12 0851 04/19/12 0323  AST 25 21  ALT 14 11  ALKPHOS 92 89  BILITOT 0.2* 0.2*  PROT 6.4 6.1  ALBUMIN 1.6* 1.6*   A: ileus >>resolved.   Diarrhea (C diff negative 8/12)>>improved. Nutrition, protein calorie malnutrition. Dysphagia 8/22 egd results: (gastric polyp with intestinal metaplasia and dysplasia found in 2009 )   1. White esophageal exudates consistent with candidiasis; multiple biopsies  2. Pedunculated polyp measuring 1.5 cm in size in the gastric antrum; multiple biopsies  3. Atrophic gastritis  4. Duodenitis P:  NPO x 4h post EGD  Defer PEG for now-although dysphagia appears to be long standing, some improvement expected    HEMATOLOGIC  Lab 04/24/12 0353 04/23/12 0328 04/22/12 0330 04/21/12 0401 04/20/12 0747  HGB 8.1* 7.9* 7.9* 9.1* 10.4*  HCT 25.9* 25.4* 25.1* 29.4* 33.2*  PLT 478* 450* 442* 431* 338  INR -- -- -- -- --  APTT -- -- -- -- --   A:  Anemia of critical illness, chronic disease, and iron deficiency.  Required 1u PRBC 8/9. P:  F/u CBC intermittently Added FeSO4 8/14 Transfuse for Hb < 7   INFECTIOUS  Lab 04/24/12 0353 04/23/12 0328 04/22/12 0330 04/21/12 0401 04/20/12 0747  WBC 18.1* 24.6* 23.2* 23.9* 19.3*  PROCALCITON -- -- -- -- --   Cultures: 8/1 Blood >>>  negative 8/1 MRSA screen: negative   Antibiotics: Vancomycin 8/1 (sepsis , skin source)>>> 8/8 Zosyn 8/1 (sepsis, skin source)>>> 8/12  A:  Possible infected diabetic ulcer / sacral decub ulcer.  Possible aspiration tube feeds 8/17>>no evidence for infection. Leukocytosis>>likely from steroids. P:   F/U CBC Monitor off Abx   ENDOCRINE  Lab 04/25/12 0732 04/25/12 0331 04/24/12 2346 04/24/12 2344 04/24/12 1943  GLUCAP 125* 214* 280* 284* 214*   A:  DM.  DKA secondary to noncompliance (history of noncompliance).   Hypothyroidism. Hypoglycemic event 8/17. P:   Synthroid Increased Lantus to 15 and ssi +TF coverage  Diabetes coordinator assisting  NEUROLOGIC  Head CT:  8/1 >>> nad  A:  Acute encephalopathy, metabolic and due to sepsis>>resolved. P:  PRN analgesia PT following   BEST PRACTICE / DISPOSITION Level of Care:  floor Primary Service:  PCCM Consultants:  None Code Status:  Limited >> no CPR/defib Diet: Tube feeds DVT Px:  SQ heparin GI Px:  PPI Skin Integrity:  As above - per  wound care consult Social / Family: No family at bedside  He was being [facility] to [facility]. Now that respiratory status improved, ready 8/20 for transfer, CIR input as alternative discharge venue. 8/21 discussed with Dr Levonne Spiller from his insurance - they want to be sure that he will not be readmitted & all acute problems resolved before approving LTAC -peg deferred , WC has decreased & Hb stable, will need rpt swallow evaln in 3-5 ds   Brett Canales Minor ACNP Adolph Pollack PCCM Pager [phone] till 3 pm If no answer page [phone] 04/25/2012, 9:31 AM   Independently examined pt, evaluated data & formulated above care plan with NP & note edited  [facility] [place] V. [phone]

## 2012-04-25 NOTE — Op Note (Signed)
[facility] [address] [place], [address]   ENDOSCOPY PROCEDURE REPORT PATIENT: [name], [name]  MR#: [id number] BIRTHDATE: 11-23-1928 , 83  yrs. old GENDER: Male ENDOSCOPIST: Meryl Dare, MD, [facility] REFERRED BY:  [facility] PROCEDURE DATE:  04/25/2012 PROCEDURE:  EGD w/ biopsy ASA CLASS:     Class III INDICATIONS:  dysphagia. MEDICATIONS: These medications were titrated to patient response per physician's verbal order, Fentanyl 25 mcg IV, and Versed 3 mg IV TOPICAL ANESTHETIC: Cetacaine Spray DESCRIPTION OF PROCEDURE: After the risks benefits and alternatives of the procedure were thoroughly explained, informed consent was obtained.  The Pentax Gastroscope [id number] endoscope was introduced through the mouth and advanced to the second portion of the duodenum. Without limitations.  The instrument was slowly withdrawn as the mucosa was fully examined.   ESOPHAGUS: White exudates consistent with candidiasis were found in the upper third of the esophagus and middle third of the esophagus. Mulitple biopsies were performed. The mucosa of the esophagus in the lower third appeared normal. STOMACH: A friable, pedunculated polyp measuring 1.5 cm with erosions and a wide stalk at the base was found in the gastric antrum.  Multiple biopsies were performed. It did not appear amenable to safe and complete polypectomy given the wide base and with his comorbidities the risks of complications was elevated. Abnormal atrophic mucosa was found in the entire examined stomach.  DUODENUM: Mild duodenal inflammation was found in the 2nd part of the duodenum. The duodenal bulb was normal. Retroflexed views revealed a hiatal hernia.     The scope was then withdrawn from the patient and the procedure completed.  COMPLICATIONS: There were no complications.  ENDOSCOPIC IMPRESSION: 1.   White esophageal exudates consistent with candidiasis; multiple biopsies 2.    Pedunculated polyp measuring 1.5 cm in size in the gastric antrum; multiple biopsies 3.   Atrophic gastritis 4.   Duodenitis  RECOMMENDATIONS: 1. Await pathology results 2. Diflucan for 7-10 days    eSigned:  Meryl Dare, MD, [facility] 04/25/2012 9:20 AM

## 2012-04-25 NOTE — Progress Notes (Signed)
SLP Cancellation Note   Treatment cancelled today due to patient receiving procedure or test.  Pt is s/p EGD today, found to have white exudates consistent with candidiasis in the upper third of the esophagus and middle third of the esophagus per op note. Plan for diflucan tx.    Note pt for reeval of swallow in 3-5 days per [facility] documentation.    SLP to follow up.    [name] Burnet, MS [facility] SLP [phone]

## 2012-04-25 NOTE — Interval H&P Note (Signed)
History and Physical Interval Note:  04/25/2012 8:43 AM  [name]  has presented today for surgery, with the diagnosis of dysphagia  The various methods of treatment have been discussed with the patient and family. After consideration of risks, benefits and other options for treatment, the patient has consented to  Procedure(s) (LRB): ESOPHAGOGASTRODUODENOSCOPY (EGD) (N/A) as a surgical intervention .  [name] history has been reviewed, patient examined, no change in status, stable for surgery.  I have reviewed the patient's chart and labs.  Questions were answered to the patient's satisfaction.     Venita Lick. Russella Dar MD Clementeen Graham

## 2012-04-25 NOTE — Progress Notes (Signed)
Physical Therapy Treatment Patient Details Name: [name] MRN: [id number] DOB: 1929-04-10 Today's Date: 04/25/2012 Time: 1410-1431 PT Time Calculation (min): 21 min  PT Assessment / Plan / Recommendation Comments on Treatment Session  Slowly progressing. O2 sats 86% on RA after ambulation. Returned pt to nasal cannula. will need continued rehab.     Follow Up Recommendations  LTACH;Skilled nursing facility;Supervision/Assistance - 24 hour    Barriers to Discharge        Equipment Recommendations  Defer to next venue    Recommendations for Other Services    Frequency Min 3X/week   Plan Discharge plan remains appropriate    Precautions / Restrictions Precautions Precautions: Fall Restrictions Weight Bearing Restrictions: No   Pertinent Vitals/Pain     Mobility  Bed Mobility Bed Mobility: Supine to Sit Supine to Sit: 4: Min assist;HOB elevated;With rails Sitting - Scoot to Edge of Bed: 4: Min assist Details for Bed Mobility Assistance: Assist for trunk to upright and bil LEs off bed. VCs safety, hand pacement, technique. Increased time.  Transfers Transfers: Stand to Sit;Sit to Stand Sit to Stand: 3: Mod assist;From bed;From elevated surface;With upper extremity assist Stand to Sit: 3: Mod assist;With upper extremity assist;With armrests;To chair/3-in-1 Details for Transfer Assistance: VCs safety, technique, hand placement. Assist to rise, stabilize, control descent.  Ambulation/Gait Ambulation/Gait Assistance: 1: +2 Total assist Ambulation/Gait: Patient Percentage: 70% Ambulation Distance (Feet): 15 Feet Assistive device: Rolling walker Ambulation/Gait Assistance Details: VCs safety, posture, distance from RW. Assist to stabilize throughout ambulation and maneuver with RW. Followed with recliner. Fatigues easily.  Gait Pattern: Decreased stride length;Decreased step length - right;Decreased step length - left;Step-through pattern    Exercises     PT Diagnosis:      PT Problem List:   PT Treatment Interventions:     PT Goals Acute Rehab PT Goals Pt will go Supine/Side to Sit: with supervision PT Goal: Supine/Side to Sit - Progress: Progressing toward goal Pt will go Sit to Stand: with supervision PT Goal: Sit to Stand - Progress: Progressing toward goal Pt will go Stand to Sit: with supervision PT Goal: Stand to Sit - Progress: Progressing toward goal Pt will Ambulate: 51 - 150 feet;with supervision;with rolling walker PT Goal: Ambulate - Progress: Progressing toward goal  Visit Information  Last PT Received On: 04/25/12 Assistance Needed: +2 (safety)    Subjective Data  Subjective: "I was playing possum" Patient Stated Goal: None stated   Cognition  Overall Cognitive Status: No family/caregiver present to determine baseline cognitive functioning Arousal/Alertness: Awake/alert Orientation Level: Appears intact for tasks assessed Behavior During Session: [facility] for tasks performed    Balance     End of Session PT - End of Session Equipment Utilized During Treatment: Gait belt Activity Tolerance: Patient limited by fatigue Patient left: in chair;with call bell/phone within reach   GP     Rebeca Alert [facility] 04/25/2012, 3:53 PM [phone]

## 2012-04-26 ENCOUNTER — Inpatient Hospital Stay (HOSPITAL_COMMUNITY)
Admission: RE | Admit: 2012-04-26 | Discharge: 2012-05-16 | DRG: 945 | Disposition: A | Discharge status: Other Acute Inpatient Hospital | Payer: Medicare Other | Source: Ambulatory Visit | Attending: Physical Medicine & Rehabilitation | Admitting: Physical Medicine & Rehabilitation

## 2012-04-26 ENCOUNTER — Telehealth: Payer: Self-pay | Admitting: Pulmonary Disease

## 2012-04-26 ENCOUNTER — Inpatient Hospital Stay (HOSPITAL_COMMUNITY): Payer: Medicare Other

## 2012-04-26 ENCOUNTER — Encounter (HOSPITAL_BASED_OUTPATIENT_CLINIC_OR_DEPARTMENT_OTHER): Payer: Medicare Other

## 2012-04-26 ENCOUNTER — Encounter: Payer: Self-pay | Admitting: Gastroenterology

## 2012-04-26 DIAGNOSIS — B3781 Candidal esophagitis: Secondary | ICD-10-CM | POA: Diagnosis present

## 2012-04-26 DIAGNOSIS — E1149 Type 2 diabetes mellitus with other diabetic neurological complication: Secondary | ICD-10-CM | POA: Diagnosis present

## 2012-04-26 DIAGNOSIS — G9341 Metabolic encephalopathy: Secondary | ICD-10-CM | POA: Diagnosis present

## 2012-04-26 DIAGNOSIS — A419 Sepsis, unspecified organism: Secondary | ICD-10-CM

## 2012-04-26 DIAGNOSIS — D649 Anemia, unspecified: Secondary | ICD-10-CM | POA: Diagnosis present

## 2012-04-26 DIAGNOSIS — D72829 Elevated white blood cell count, unspecified: Secondary | ICD-10-CM | POA: Diagnosis not present

## 2012-04-26 DIAGNOSIS — L89309 Pressure ulcer of unspecified buttock, unspecified stage: Secondary | ICD-10-CM | POA: Diagnosis present

## 2012-04-26 DIAGNOSIS — R1310 Dysphagia, unspecified: Secondary | ICD-10-CM

## 2012-04-26 DIAGNOSIS — J9600 Acute respiratory failure, unspecified whether with hypoxia or hypercapnia: Secondary | ICD-10-CM | POA: Diagnosis present

## 2012-04-26 DIAGNOSIS — E1142 Type 2 diabetes mellitus with diabetic polyneuropathy: Secondary | ICD-10-CM | POA: Diagnosis present

## 2012-04-26 DIAGNOSIS — A4189 Other specified sepsis: Secondary | ICD-10-CM

## 2012-04-26 DIAGNOSIS — T380X5A Adverse effect of glucocorticoids and synthetic analogues, initial encounter: Secondary | ICD-10-CM | POA: Diagnosis not present

## 2012-04-26 DIAGNOSIS — M86169 Other acute osteomyelitis, unspecified tibia and fibula: Secondary | ICD-10-CM | POA: Diagnosis not present

## 2012-04-26 DIAGNOSIS — Z9119 Patient's noncompliance with other medical treatment and regimen: Secondary | ICD-10-CM

## 2012-04-26 DIAGNOSIS — E039 Hypothyroidism, unspecified: Secondary | ICD-10-CM | POA: Diagnosis present

## 2012-04-26 DIAGNOSIS — Z5189 Encounter for other specified aftercare: Secondary | ICD-10-CM

## 2012-04-26 DIAGNOSIS — I214 Non-ST elevation (NSTEMI) myocardial infarction: Secondary | ICD-10-CM | POA: Diagnosis present

## 2012-04-26 DIAGNOSIS — L8990 Pressure ulcer of unspecified site, unspecified stage: Secondary | ICD-10-CM | POA: Diagnosis present

## 2012-04-26 DIAGNOSIS — Z91199 Patient's noncompliance with other medical treatment and regimen due to unspecified reason: Secondary | ICD-10-CM

## 2012-04-26 DIAGNOSIS — R5381 Other malaise: Secondary | ICD-10-CM | POA: Diagnosis present

## 2012-04-26 LAB — BASIC METABOLIC PANEL
BUN: 32 mg/dL — ABNORMAL HIGH (ref 6–23)
GFR calc non Af Amer: 83 mL/min — ABNORMAL LOW (ref >90)
Glucose, Bld: 292 mg/dL — ABNORMAL HIGH (ref 70–99)
Potassium: 4.8 mEq/L (ref 3.5–5.1)

## 2012-04-26 LAB — CBC
HCT: 24.8 % — ABNORMAL LOW (ref 39.0–52.0)
Hemoglobin: 7.8 g/dL — ABNORMAL LOW (ref 13.0–17.0)
MCH: 31.5 pg (ref 26.0–34.0)
MCHC: 31.5 g/dL (ref 30.0–36.0)

## 2012-04-26 LAB — GLUCOSE, CAPILLARY
Glucose-Capillary: 160 mg/dL — ABNORMAL HIGH (ref 70–99)
Glucose-Capillary: 198 mg/dL — ABNORMAL HIGH (ref 70–99)
Glucose-Capillary: 249 mg/dL — ABNORMAL HIGH (ref 70–99)
Glucose-Capillary: 269 mg/dL — ABNORMAL HIGH (ref 70–99)
Glucose-Capillary: 282 mg/dL — ABNORMAL HIGH (ref 70–99)

## 2012-04-26 MED ORDER — FERROUS SULFATE 300 (60 FE) MG/5ML PO SYRP: 300.0000 mg | ORAL_SOLUTION | Freq: Three times a day (TID) | ORAL | Status: DC

## 2012-04-26 MED ORDER — METOPROLOL TARTRATE 25 MG/10 ML ORAL SUSPENSION
12.5000 mg | Freq: Two times a day (BID) | ORAL | Status: DC
  Administered 2012-04-26 – 2012-04-27 (×3): 12.5 mg
  Filled 2012-04-26 (×6): qty 5

## 2012-04-26 MED ORDER — HYDROCODONE-ACETAMINOPHEN 7.5-500 MG/15ML PO SOLN: 10.0000 mL | ORAL | Status: DC | PRN

## 2012-04-26 MED ORDER — LEVOTHYROXINE SODIUM 75 MCG PO TABS: 75.0000 ug | ORAL_TABLET | Freq: Every day | ORAL | Status: DC

## 2012-04-26 MED ORDER — ALBUTEROL SULFATE (5 MG/ML) 0.5% IN NEBU: 2.5000 mg | INHALATION_SOLUTION | RESPIRATORY_TRACT | Status: DC | PRN

## 2012-04-26 MED ORDER — HEPARIN SODIUM (PORCINE) 5000 UNIT/ML IJ SOLN
5000.0000 [IU] | Freq: Three times a day (TID) | INTRAMUSCULAR | Status: DC
  Administered 2012-04-26 – 2012-05-15 (×50): 5000 [IU] via SUBCUTANEOUS
  Filled 2012-04-26 (×59): qty 1

## 2012-04-26 MED ORDER — SODIUM CHLORIDE 0.45 % IV SOLN: 20.0000 mL | INTRAVENOUS | Status: DC

## 2012-04-26 MED ORDER — IPRATROPIUM BROMIDE 0.02 % IN SOLN: 0.5000 mg | RESPIRATORY_TRACT | Status: DC | PRN

## 2012-04-26 MED ORDER — ONDANSETRON HCL 4 MG PO TABS: 4.0000 mg | ORAL_TABLET | Freq: Four times a day (QID) | ORAL | Status: DC | PRN

## 2012-04-26 MED ORDER — INSULIN GLARGINE 100 UNIT/ML ~~LOC~~ SOLN: 15.0000 [IU] | Freq: Every day | SUBCUTANEOUS | Status: DC

## 2012-04-26 MED ORDER — FERROUS SULFATE 300 (60 FE) MG/5ML PO SYRP
300.0000 mg | ORAL_SOLUTION | Freq: Three times a day (TID) | ORAL | Status: DC
  Administered 2012-04-26 – 2012-05-05 (×22): 300 mg
  Filled 2012-04-26 (×36): qty 5

## 2012-04-26 MED ORDER — INSULIN GLARGINE 100 UNIT/ML ~~LOC~~ SOLN
15.0000 [IU] | Freq: Every day | SUBCUTANEOUS | Status: DC
  Administered 2012-04-26 – 2012-05-01 (×5): 15 [IU] via SUBCUTANEOUS

## 2012-04-26 MED ORDER — OXEPA PO LIQD
1000.0000 mL | ORAL | Status: DC
  Administered 2012-04-26: 1000 mL
  Filled 2012-04-26 (×3): qty 1000

## 2012-04-26 MED ORDER — PREDNISONE 5 MG/ML PO CONC
60.0000 mg | Freq: Every day | ORAL | Status: DC
  Administered 2012-04-27 – 2012-05-05 (×9): 60 mg
  Filled 2012-04-26 (×13): qty 12

## 2012-04-26 MED ORDER — RANITIDINE HCL 150 MG/10ML PO SYRP
150.0000 mg | ORAL_SOLUTION | Freq: Two times a day (BID) | ORAL | Status: DC
  Administered 2012-04-26 – 2012-05-05 (×16): 150 mg via ORAL
  Filled 2012-04-26 (×23): qty 10

## 2012-04-26 MED ORDER — COLLAGENASE 250 UNIT/GM EX OINT
TOPICAL_OINTMENT | Freq: Every day | CUTANEOUS | Status: DC
  Administered 2012-04-27 – 2012-05-08 (×12): via TOPICAL
  Administered 2012-05-09: 1 via TOPICAL
  Administered 2012-05-10 – 2012-05-15 (×6): via TOPICAL
  Filled 2012-04-26 (×2): qty 30

## 2012-04-26 MED ORDER — FLUCONAZOLE IN SODIUM CHLORIDE 200-0.9 MG/100ML-% IV SOLN: 200.0000 mg | Freq: Every day | INTRAVENOUS | Status: AC

## 2012-04-26 MED ORDER — PREDNISONE 5 MG/ML PO CONC: 60.0000 mg | Freq: Every day | ORAL | Status: AC

## 2012-04-26 MED ORDER — OXEPA PO LIQD: 1000.0000 mL | ORAL | Status: DC

## 2012-04-26 MED ORDER — RANITIDINE HCL 150 MG/10ML PO SYRP: 150.0000 mg | ORAL_SOLUTION | Freq: Two times a day (BID) | ORAL | Status: DC

## 2012-04-26 MED ORDER — ASPIRIN 81 MG PO CHEW: 81.0000 mg | CHEWABLE_TABLET | Freq: Every day | ORAL | Status: DC

## 2012-04-26 MED ORDER — INSULIN ASPART 100 UNIT/ML ~~LOC~~ SOLN: 0.0000 [IU] | SUBCUTANEOUS | Status: DC

## 2012-04-26 MED ORDER — PREDNISONE 5 MG/ML PO CONC
60.0000 mg | Freq: Every day | ORAL | Status: DC
  Filled 2012-04-26: qty 12

## 2012-04-26 MED ORDER — FLUCONAZOLE IN SODIUM CHLORIDE 200-0.9 MG/100ML-% IV SOLN
200.0000 mg | Freq: Every day | INTRAVENOUS | Status: AC
  Administered 2012-04-26 – 2012-05-02 (×7): 200 mg via INTRAVENOUS
  Filled 2012-04-26 (×8): qty 100

## 2012-04-26 MED ORDER — ASPIRIN 81 MG PO CHEW
81.0000 mg | CHEWABLE_TABLET | Freq: Every day | ORAL | Status: DC
  Administered 2012-04-27 – 2012-05-15 (×17): 81 mg via ORAL
  Filled 2012-04-26 (×17): qty 1

## 2012-04-26 MED ORDER — ONDANSETRON HCL 4 MG/2ML IJ SOLN: 4.0000 mg | Freq: Four times a day (QID) | INTRAMUSCULAR | Status: DC | PRN

## 2012-04-26 MED ORDER — LEVOTHYROXINE SODIUM 75 MCG PO TABS
75.0000 ug | ORAL_TABLET | Freq: Every day | ORAL | Status: DC
  Administered 2012-04-27 – 2012-05-05 (×9): 75 ug
  Filled 2012-04-26 (×11): qty 1

## 2012-04-26 MED ORDER — METOPROLOL TARTRATE 25 MG/10 ML ORAL SUSPENSION: 12.5000 mg | Freq: Two times a day (BID) | ORAL | Status: DC

## 2012-04-26 MED ORDER — INSULIN ASPART 100 UNIT/ML ~~LOC~~ SOLN
0.0000 [IU] | SUBCUTANEOUS | Status: DC
  Administered 2012-04-26: 7 [IU] via SUBCUTANEOUS
  Administered 2012-04-27 (×2): 3 [IU] via SUBCUTANEOUS
  Administered 2012-04-27: 11 [IU] via SUBCUTANEOUS
  Administered 2012-04-27: 7 [IU] via SUBCUTANEOUS
  Administered 2012-04-27: 4 [IU] via SUBCUTANEOUS
  Administered 2012-04-27: 7 [IU] via SUBCUTANEOUS
  Administered 2012-04-28 (×2): 11 [IU] via SUBCUTANEOUS
  Administered 2012-04-28: 7 [IU] via SUBCUTANEOUS
  Administered 2012-04-28: 4 [IU] via SUBCUTANEOUS
  Administered 2012-04-28: 11 [IU] via SUBCUTANEOUS
  Administered 2012-04-29: 4 [IU] via SUBCUTANEOUS
  Administered 2012-04-29: 7 [IU] via SUBCUTANEOUS
  Administered 2012-04-29: 11 [IU] via SUBCUTANEOUS
  Administered 2012-04-29: 4 [IU] via SUBCUTANEOUS
  Administered 2012-04-29: 11 [IU] via SUBCUTANEOUS
  Administered 2012-04-29 – 2012-04-30 (×3): 4 [IU] via SUBCUTANEOUS
  Administered 2012-04-30: 7 [IU] via SUBCUTANEOUS
  Administered 2012-04-30 – 2012-05-01 (×2): 4 [IU] via SUBCUTANEOUS
  Administered 2012-05-01: 7 [IU] via SUBCUTANEOUS
  Administered 2012-05-02 – 2012-05-03 (×5): 4 [IU] via SUBCUTANEOUS
  Administered 2012-05-03: 3 [IU] via SUBCUTANEOUS
  Administered 2012-05-04 (×2): 4 [IU] via SUBCUTANEOUS
  Administered 2012-05-04: 3 [IU] via SUBCUTANEOUS
  Administered 2012-05-04: 7 [IU] via SUBCUTANEOUS
  Administered 2012-05-04: 3 [IU] via SUBCUTANEOUS
  Administered 2012-05-05: 4 [IU] via SUBCUTANEOUS
  Administered 2012-05-05: 11 [IU] via SUBCUTANEOUS
  Administered 2012-05-05 (×2): 7 [IU] via SUBCUTANEOUS
  Administered 2012-05-06: 4 [IU] via SUBCUTANEOUS
  Administered 2012-05-06: 7 [IU] via SUBCUTANEOUS
  Administered 2012-05-07: 3 [IU] via SUBCUTANEOUS
  Administered 2012-05-07 (×2): 11 [IU] via SUBCUTANEOUS
  Administered 2012-05-07: 15 [IU] via SUBCUTANEOUS
  Administered 2012-05-07 – 2012-05-08 (×3): 11 [IU] via SUBCUTANEOUS

## 2012-04-26 MED ORDER — PRO-STAT SUGAR FREE PO LIQD: 30.0000 mL | Freq: Two times a day (BID) | ORAL | Status: DC

## 2012-04-26 MED ORDER — COLLAGENASE 250 UNIT/GM EX OINT: TOPICAL_OINTMENT | Freq: Every day | CUTANEOUS | Status: AC

## 2012-04-26 NOTE — Progress Notes (Signed)
Report given to Judeth Cornfield, RN on unit 4000 at [facility]. [facility] called and pick up scheduled. Pt's daughter, [name], called and informed of scheduled pick up/transfer to [facility].

## 2012-04-26 NOTE — Progress Notes (Signed)
I have taken an interval history, reviewed the chart and examined the patient. I agree with the extender's note, impression and recommendations.   Aviyanna Colbaugh T. Zaine Elsass MD FACG 

## 2012-04-26 NOTE — Progress Notes (Signed)
Rehab admissions - I received a call from case manager asking me to work on getting patient to inpatient rehab.  I have called and faxed information to [employer].  I spoke with daughter.  Dtr works days but can help in the afternoons.  Wife can provide supervision but not physical assistance.  Call me for questions.  #[phone]

## 2012-04-26 NOTE — Progress Notes (Signed)
Rehab admissions - Evaluated for possible admission.  I spoke with case manager on acute who says that we are trying for [facility] for patient.  Inpatient rehab is an option that we can consider if patient cannot go to [facility].  I will follow along for progress.  Call me for questions.  #[phone]

## 2012-04-26 NOTE — H&P (Signed)
Physical Medicine and Rehabilitation Admission H&P    Chief Complaint  Patient presents with  . Hyperglycemia  : HPI: [name] is a 78 y.o. right-handed male with diabetes mellitus and peripheral neuropathy coronary artery disease and congestive heart failure. Patient noted to have poor medical compliance. Admitted 04/04/2012 with nausea, vomiting and altered mental status. Found to be hyperglycemic as well as acidotic. Cranial CT scan with chronic changes without acute abnormality. Patient was hypotensive secondary to dehydration with creatinine 1.41 and placed on intravenous fluids. Noted marginally elevated cardiac enzymes. Patient was intubated followed by critical care medicine. Followup cardiology services Dr. Patty Sermons with EKG showing normal sinus rhythm right bundle branch block and left anterior fascicular block consistent with type II NSTEMI felt to be secondary to demand ischemia from his sepsis and hypotension. Echocardiogram with ejection fraction of 45%, diffuse hypokinesis and grade 1 diastolic dysfunction. Patient was slowly extubated. Maintained on subcutaneous heparin for DVT prophylaxis. Wound care nurse consulted for multiple wounds most significant located on the right lateral malleolus and left lower buttocks with appropriate skin care recommended. Noted ongoing bouts of confusion altered mental status felt to be secondary to acute encephalopathy due to sepsis that has steadily improved. Noted dysphagia questionable aspiration and nasogastric tube was placed for nutritional support. EGD. completed 04/25/2012 for further evaluation and diagnosis of dysphagia per Dr. Russella Dar that showed esophageal candidiasis and atrophic gastritis and placed on Diflucan x7 days. There was questionable possible gastrostomy feeding tube secondary to his dysphagia which appears to be long-standing and continued to be monitored. Physical occupational therapy ongoing with noted profound deconditioning with  recommendations for inpatient rehabilitation services. Physical medicine rehabilitation consult was requested and felt to be a gait candidate for comprehensive rehabilitation program  Review of Systems  Gastrointestinal: Positive for nausea and vomiting.  All other systems reviewed and are negative   Past Medical History  Diagnosis Date  . Diabetes mellitus   . Cancer   . Thyroid disease   . Anemia   . Hyperlipidemia   . Neuropathy, diabetic   . Diabetic retinopathy    Past Surgical History  Procedure Date  . Cataract extraction, bilateral   . Colonoscopy w/ polypectomy    Family History  Problem Relation Age of Onset  . Hypertension     Social History:  reports that he quit smoking about 5 years ago. His smoking use included Cigarettes. He smoked .25 packs per day. He has never used smokeless tobacco. He reports that he does not drink alcohol or use illicit drugs. Allergies: No Known Allergies Medications Prior to Admission  Medication Sig Dispense Refill  . ferrous sulfate 325 (65 FE) MG tablet Take 325 mg by mouth 2 (two) times daily.       . folic acid (FOLVITE) 1 MG tablet Take 1 mg by mouth daily.      . insulin aspart protamine-insulin aspart (NOVOLOG 70/30) (70-30) 100 UNIT/ML injection Inject 15-18 Units into the skin 2 (two) times daily. 18 units in am, 15 units in pm      . levothyroxine (SYNTHROID, LEVOTHROID) 25 MCG tablet Take 25 mcg by mouth daily.      Marland Kitchen levothyroxine (SYNTHROID, LEVOTHROID) 50 MCG tablet Take 50 mcg by mouth daily. Take with 25 mcg tablet . Total dose is 75 mcg.      . ranitidine (ZANTAC) 150 MG tablet Take 150 mg by mouth 2 (two) times daily.      . simvastatin (ZOCOR) 20 MG tablet Take  30 mg by mouth every evening. Take one and a half tablet by mouth at bedtime      . Tamsulosin HCl (FLOMAX) 0.4 MG CAPS Take 0.4 mg by mouth daily after supper.      . vitamin B-12 (CYANOCOBALAMIN) 1000 MCG tablet Take 1,000 mcg by mouth daily.      . vitamin C  (ASCORBIC ACID) 500 MG tablet Take 500 mg by mouth 2 (two) times daily.        Home: Home Living Lives With: Spouse Available Help at Discharge: Family Type of Home: House Home Access: Stairs to enter [employer] of Steps: 2 Entrance Stairs-Rails: Right;Left;Can reach both Home Layout: One level Bathroom Shower/Tub: Engineer, manufacturing systems: Standard Home Adaptive Equipment: Walker - rolling;Tub transfer bench;Bedside commode/3-in-1   Functional History: Prior Function Able to Take Stairs?: Yes  Functional Status:  Mobility: Bed Mobility Bed Mobility: Supine to Sit Supine to Sit: 4: Min assist;HOB elevated;With rails Sitting - Scoot to Edge of Bed: 4: Min assist Transfers Transfers: Stand to Sit;Sit to Stand Sit to Stand: 3: Mod assist;From bed;From elevated surface;With upper extremity assist Sit to Stand: Patient Percentage: 80% Stand to Sit: 3: Mod assist;With upper extremity assist;With armrests;To chair/3-in-1 Stand to Sit: Patient Percentage: 80% Stand Pivot Transfers: 1: +2 Total assist Stand Pivot Transfers: Patient Percentage: 60% Ambulation/Gait Ambulation/Gait Assistance: 1: +2 Total assist Ambulation/Gait: Patient Percentage: 70% Ambulation Distance (Feet): 15 Feet Assistive device: Rolling walker Ambulation/Gait Assistance Details: VCs safety, posture, distance from RW. Assist to stabilize throughout ambulation and maneuver with RW. Followed with recliner. Fatigues easily.  Gait Pattern: Decreased stride length;Decreased step length - right;Decreased step length - left;Step-through pattern Gait velocity: decreased    ADL: ADL Eating/Feeding: NPO Grooming: Simulated;Set up Where Assessed - Grooming: Supported sitting Upper Body Bathing: Simulated;Set up Where Assessed - Upper Body Bathing: Supported sitting Lower Body Bathing: Simulated;+2 Total assistance Where Assessed - Lower Body Bathing: Supported sit to stand Upper Body  Dressing: Performed;Set up Where Assessed - Upper Body Dressing: Unsupported sitting Lower Body Dressing: Simulated;+2 Total assistance Where Assessed - Lower Body Dressing: Supported sit to stand Toilet Transfer: Simulated;+2 Total assistance Toilet Transfer Method: Surveyor, minerals: Other (comment) (to recliner) Equipment Used: Rolling walker ADL Comments: Pt only agreeable to bed>chair transfer. Had just refused a bath from nurse techs.  Cognition: Cognition Arousal/Alertness: Awake/alert Orientation Level: Oriented X4 Cognition Overall Cognitive Status: No family/caregiver present to determine baseline cognitive functioning Arousal/Alertness: Awake/alert Orientation Level: Appears intact for tasks assessed Behavior During Session: [facility] for tasks performed Cognition - Other Comments: oriented to month, day of week, and year   Blood pressure 110/58, pulse 96, temperature 99.1 F (37.3 C), temperature source Oral, resp. rate 16, height 5\' 9"  (1.753 m), weight 65 kg (143 lb 4.8 oz), SpO2 100.00%. Physical Exam  Vitals reviewed.  HENT:  Head: Normocephalic.  NGT in place. Missing many teeth. Mucosa pink  Eyes:  Pupils round and reactive to light  Neck: Neck supple. No thyromegaly present.  Cardiovascular: Regular rhythm.  Pulmonary/Chest: Breath sounds normal. No respiratory distress. He has no wheezes.  Abdominal: Bowel sounds are normal. He exhibits no distension.  Neurological: He is alert.  Patient is a poor historian. He was able to give his date of birth and age. He could name the hospital with multiple cues. He would follow simple commands. Poor insight and awareness. He had difficulty controlling his secretions and a weak cough. Strength was symmetrical but he was  weak more proximally than distally (2-3 prox to 4 distally). No focal sensory loss was discerned.  Skin:  Skin with santyl dressing to right malleolus. Patient refused to turn in bed to  examine buttocks  Psychiatric:  Patient with poor awareness of his deficits    Results for orders placed during the hospital encounter of 04/04/12 (from the past 48 hour(s))  GLUCOSE, CAPILLARY     Status: Abnormal   Collection Time   04/24/12  4:33 PM      Component Value Range Comment   Glucose-Capillary 179 (*) 70 - 99 mg/dL   GLUCOSE, CAPILLARY     Status: Abnormal   Collection Time   04/24/12  7:43 PM      Component Value Range Comment   Glucose-Capillary 214 (*) 70 - 99 mg/dL   GLUCOSE, CAPILLARY     Status: Abnormal   Collection Time   04/24/12 11:44 PM      Component Value Range Comment   Glucose-Capillary 284 (*) 70 - 99 mg/dL   GLUCOSE, CAPILLARY     Status: Abnormal   Collection Time   04/24/12 11:46 PM      Component Value Range Comment   Glucose-Capillary 280 (*) 70 - 99 mg/dL   GLUCOSE, CAPILLARY     Status: Abnormal   Collection Time   04/25/12  3:31 AM      Component Value Range Comment   Glucose-Capillary 214 (*) 70 - 99 mg/dL   GLUCOSE, CAPILLARY     Status: Abnormal   Collection Time   04/25/12  7:32 AM      Component Value Range Comment   Glucose-Capillary 125 (*) 70 - 99 mg/dL   GLUCOSE, CAPILLARY     Status: Abnormal   Collection Time   04/25/12 11:53 AM      Component Value Range Comment   Glucose-Capillary 130 (*) 70 - 99 mg/dL   GLUCOSE, CAPILLARY     Status: Normal   Collection Time   04/25/12  4:52 PM      Component Value Range Comment   Glucose-Capillary 84  70 - 99 mg/dL   GLUCOSE, CAPILLARY     Status: Abnormal   Collection Time   04/25/12  8:10 PM      Component Value Range Comment   Glucose-Capillary 119 (*) 70 - 99 mg/dL    Comment 1 Notify RN     GLUCOSE, CAPILLARY     Status: Abnormal   Collection Time   04/26/12 12:20 AM      Component Value Range Comment   Glucose-Capillary 269 (*) 70 - 99 mg/dL    Comment 1 Notify RN     GLUCOSE, CAPILLARY     Status: Abnormal   Collection Time   04/26/12  3:54 AM      Component Value Range  Comment   Glucose-Capillary 282 (*) 70 - 99 mg/dL   CBC     Status: Abnormal   Collection Time   04/26/12  3:55 AM      Component Value Range Comment   WBC 16.3 (*) 4.0 - 10.5 K/uL    RBC 2.48 (*) 4.22 - 5.81 MIL/uL    Hemoglobin 7.8 (*) 13.0 - 17.0 g/dL    HCT 16.1 (*) 09.6 - 52.0 %    MCV 100.0  78.0 - 100.0 fL    MCH 31.5  26.0 - 34.0 pg    MCHC 31.5  30.0 - 36.0 g/dL    RDW 04.5 (*) 40.9 -  15.5 %    Platelets 444 (*) 150 - 400 K/uL   BASIC METABOLIC PANEL     Status: Abnormal   Collection Time   04/26/12  3:55 AM      Component Value Range Comment   Sodium 131 (*) 135 - 145 mEq/L    Potassium 4.8  3.5 - 5.1 mEq/L    Chloride 96  96 - 112 mEq/L    CO2 28  19 - 32 mEq/L    Glucose, Bld 292 (*) 70 - 99 mg/dL    BUN 32 (*) 6 - 23 mg/dL    Creatinine, Ser 1.47  0.50 - 1.35 mg/dL    Calcium 7.7 (*) 8.4 - 10.5 mg/dL    GFR calc non Af Amer 83 (*) >90 mL/min    GFR calc Af Amer >90  >90 mL/min   GLUCOSE, CAPILLARY     Status: Abnormal   Collection Time   04/26/12  7:39 AM      Component Value Range Comment   Glucose-Capillary 160 (*) 70 - 99 mg/dL   GLUCOSE, CAPILLARY     Status: Abnormal   Collection Time   04/26/12 12:08 PM      Component Value Range Comment   Glucose-Capillary 198 (*) 70 - 99 mg/dL    Dg Chest Port 1 View  04/26/2012  *RADIOLOGY REPORT*  Clinical Data: Cough.  Congestion.  Post EGD.  Diabetes.  PORTABLE CHEST - 1 VIEW  Comparison: 04/25/2012  Findings: Feeding tube extends beyond the  inferior aspect of the film.  Midline trachea.   Borderline cardiomegaly.     No pleural effusion or pneumothorax.  No significant change in bilateral perihilar interstitial and airspace disease.  IMPRESSION: No change in interstitial and airspace opacities bilaterally. Pulmonary edema and/or infection.   Original Report Authenticated By: Consuello Bossier, M.D.    Dg Chest Port 1 View  04/25/2012  *RADIOLOGY REPORT*  Clinical Data: Pneumonia.  Airspace disease.  Diabetes.  Prostate  cancer.  PORTABLE CHEST - 1 VIEW  Comparison: 04/22/2012  Findings: Feeding tube extends down into the stomach.  Bilateral airspace and interstitial opacities are still present, although improved compared to the prior exam.  Confluence noted at the left lung base.  Heart size is within normal limits.  IMPRESSION:  1.  Bilateral interstitial and patchy airspace opacities, mildly improved from prior, potentially reflecting bilateral pneumonia or pulmonary edema.   Original Report Authenticated By: Dellia Cloud, M.D.     Post Admission Physician Evaluation: 1. Functional deficits secondary  to deconditioning, encephalopathy after multiple medical. 2. Patient is admitted to receive collaborative, interdisciplinary care between the physiatrist, rehab nursing staff, and therapy team. 3. Patient's level of medical complexity and substantial therapy needs in context of that medical necessity cannot be provided at a lesser intensity of care such as a SNF. 4. Patient has experienced substantial functional loss from his/her baseline which was documented above under the "Functional History" and "Functional Status" headings.  Judging by the patient's diagnosis, physical exam, and functional history, the patient has potential for functional progress which will result in measurable gains while on inpatient rehab.  These gains will be of substantial and practical use upon discharge  in facilitating mobility and self-care at the household level. 5. Physiatrist will provide 24 hour management of medical needs as well as oversight of the therapy plan/treatment and provide guidance as appropriate regarding the interaction of the two. 6. 24 hour rehab nursing will assist with  bladder management, bowel management, safety, skin/wound care, disease management, medication administration, pain management and patient education  and help integrate therapy concepts, techniques,education, etc. 7. PT will assess and treat for:   fxnl mobility, lower extremity strength, stamina, balance.  Goals are: mod I to supevision. 8. OT will assess and treat for: upper extremity strength, ROM, fxnl mobility, stamina,ADL's,.   Goals are: supervision to minimal assist. 9. SLP will assess and treat for: cognition and communication.  Goals are: mod I. 10. Case Management and Social Worker will assess and treat for psychological issues and discharge planning. 11. Team conference will be held weekly to assess progress toward goals and to determine barriers to discharge. 12. Patient will receive at least 3 hours of therapy per day at least 5 days per week. 13. ELOS: 2-3 weeks      Prognosis:  good   Medical Problem List and Plan: 1. deconditioning related to sepsis multiple medical complications/metabolic encephalopathy/respiratory failure. Will discuss with critical care medicine on weaning of prednisone 2. DVT Prophylaxis/Anticoagulation: Subcutaneous heparin. Monitor platelet counts and any signs of bleeding 3. Pain Management: Lortab as needed. Monitor with increased mobility 4. Neuropsych: This patient is not capable of making decisions on his/her own behalf. 5. Dysphasia. Continue nasogastric tubes for now for nutritional support. Will make further evaluation for possible gastrostomy tube for long-term nutrition 6.NSTEMI. Followup per cardiology services. Continue aspirin therapy as well as Lopressor 12.5 mg twice a day. No current chest pain or shortness of breath 7. Esophageal candidiasis. Diflucan x7 days 8. Diabetes mellitus with peripheral neuropathy. Hemoglobin A1c 10.5. Lantus insulin 15 units at bedtime. Check blood sugars a.c. and at bedtime 9. Right lateral malleolus on left lower buttocks decubitus ulcers. Skin care as advised with WOC nurse for skin consult 10. Anemia. Latest hemoglobin 7.8. Monitor for a bleeding episodes and followup CBC with plan to transfuse if symptomatic 11. Hypothyroidism. Synthroid 12. Medical  noncompliance. Will discuss at length with patient and family the need to maintain medical regimen 04/26/2012, Ivory Broad, MD

## 2012-04-26 NOTE — Progress Notes (Signed)
Name: [name] MRN: [id number] DOB: 1929-02-28    LOS: 22  Referring Provider:  EDP Reason for Referral:  DKA  PULMONARY / CRITICAL CARE MEDICINE    Brief patient description:  78 yo diabetic admit from home via [facility] ED with nausea, vomiting and altered mental status. Treated for DKA but course also significant for sepsis due to skin source vs PNA, NSTEMI with shock (septic + cardiogenic), resp failure.   Events: 8/17 Transfer back to ICU for hypoxia>>?displacement of panda and aspiration of tube feeds 8/20 pulled panda out, failed swallow,  8/22 EGD 8/23 await transfer to LTAC  Current Status:  Breathing improved.   Alert, denies pain Wants something to eat   Vital Signs: Temp:  [95.5 F (35.3 C)-99.1 F (37.3 C)] 99.1 F (37.3 C) (08/23 0600) Pulse Rate:  [65-96] 96  (08/23 0600) Resp:  [16] 16  (08/23 0600) BP: (98-110)/(50-61) 110/58 mmHg (08/23 0600) SpO2:  [98 %-100 %] 100 % (08/23 0600) Weight:  [143 lb 4.8 oz (65 kg)] 143 lb 4.8 oz (65 kg) (08/23 0600)    Intake/Output Summary (Last 24 hours) at 04/26/12 1023 Last data filed at 04/26/12 0600  Gross per 24 hour  Intake    900 ml  Output    900 ml  Net      0 ml  NRB  Physical Examination: General: awake  Neuro: follows commands HEENT: No sinus tenderness Neck:  Supple Cardiovascular: s1s2 regular, no murmur Lungs: congested cough, poor cough, rhonchi throughout , on O2 Abdomen:  Soft, non tender, bowel sounds present  Musculoskeletal: no edema Skin:  R foot ulcer, sacral decubitus ulcer  Dg Chest Port 1 View  04/26/2012  *RADIOLOGY REPORT*  Clinical Data: Cough.  Congestion.  Post EGD.  Diabetes.  PORTABLE CHEST - 1 VIEW  Comparison: 04/25/2012  Findings: Feeding tube extends beyond the  inferior aspect of the film.  Midline trachea.   Borderline cardiomegaly.     No pleural effusion or pneumothorax.  No significant change in bilateral perihilar interstitial and airspace disease.  IMPRESSION: No  change in interstitial and airspace opacities bilaterally. Pulmonary edema and/or infection.   Original Report Authenticated By: Consuello Bossier, M.D.    Dg Chest Port 1 View  04/25/2012  *RADIOLOGY REPORT*  Clinical Data: Pneumonia.  Airspace disease.  Diabetes.  Prostate cancer.  PORTABLE CHEST - 1 VIEW  Comparison: 04/22/2012  Findings: Feeding tube extends down into the stomach.  Bilateral airspace and interstitial opacities are still present, although improved compared to the prior exam.  Confluence noted at the left lung base.  Heart size is within normal limits.  IMPRESSION:  1.  Bilateral interstitial and patchy airspace opacities, mildly improved from prior, potentially reflecting bilateral pneumonia or pulmonary edema.   Original Report Authenticated By: Dellia Cloud, M.D.     ASSESSMENT AND PLAN  PULMONARY  ABG    Component Value Date/Time   PHART 7.366 04/20/2012 0624   PCO2ART 57.1* 04/20/2012 0624   PO2ART 67.5* 04/20/2012 0624   HCO3 31.9* 04/20/2012 0624   TCO2 30.1 04/20/2012 0624   ACIDBASEDEF 3.5* 04/09/2012 0400   O2SAT 90.7 04/20/2012 0624   ETT:  8/2>>>8/4 ETT:  8/5>>>8/15  A:  Acute respiratory failure in setting of septic shock skin and DKA.  Persistent pulmonary infiltrates likely from fibro-proliferative ARDS >>Improved after adding steroids 8/13.  Worsening hypoxia 8/17 with ?aspiration of tube feeds>>improved 8/18. ESR 131 from 8/13 >> 112 (8/19) 8/22 post EGD very  congested and desaturations noted P:    Drop PO pred 60 daily, rpt ESR remains high(slow wean) F/u CXR intermittently q 2-3 ds Titrate oxygen to keep SpO2 > 90% Keep in even fluid balance   CARDIOVASCULAR No results found for this basename: TROPONINI:5,LATICACIDVEN:5, O2SATVEN:5,PROBNP:5 in the last 168 hours   Lines: CVL  L [place] 8/2>>8/14  Echo 8/02>>EF 40 to 45%, diffuse hypokinesis, grade 1 diastolic dysfx, mod RV systolic dysfx  A:  Septic shock>>resolved. NSTEMI and superimposed  cardiogenic shock>>shock resolved. Acute combined CHF with acute pulmonary edema>>improved. Cardiology signed off 8/07>>recommended medical management. P:  Restarted ASA 8/13 (held due to concern for GI bleeding) Continue lopressor  RENAL  Lab 04/26/12 0355 04/24/12 0353 04/23/12 0328 04/22/12 0330 04/21/12 0401  NA 131* 138 141 145 147*  K 4.8 4.5 -- -- --  CL 96 101 105 107 107  CO2 28 31 33* 34* 35*  BUN 32* 32* 38* 47* 51*  CREATININE 0.76 0.74 0.75 0.80 0.84  CALCIUM 7.7* 8.2* 8.3* 8.3* 8.4  MG -- -- -- -- --  PHOS -- -- -- -- --   Intake/Output      08/22 0701 - 08/23 0700 08/23 0701 - 08/24 0700   I.V. (mL/kg) 500 (7.7)    NG/GT 300    IV Piggyback 100    Total Intake(mL/kg) 900 (13.8)    Urine (mL/kg/hr) 900 (0.6)    Total Output 900    Net 0         Urine Occurrence 2 x     Foley:  8/1 >>>  A:  Mild hypernatremia -resolved, now hyponatremic P:  F/u BMET, dc  free water flushes  GASTROINTESTINAL  Lab 04/20/12 0851  AST 25  ALT 14  ALKPHOS 92  BILITOT 0.2*  PROT 6.4  ALBUMIN 1.6*   A: ileus >>resolved.   Diarrhea (C diff negative 8/12)>>improved. Nutrition, protein calorie malnutrition. Dysphagia 8/22 egd results: (gastric polyp with intestinal metaplasia and dysplasia found in 2009 )   1. esophageal candidiasis; multiple biopsies  2. Pedunculated polyp measuring 1.5 cm in size in the gastric antrum; multiple biopsies  3. Atrophic gastritis  4. Duodenitis P: Defer PEG for now-although dysphagia appears to be long standing, some improvement expected Continue Tube feeds, Reassess swallow next week after esophogeal infection treated    HEMATOLOGIC  Lab 04/26/12 0355 04/24/12 0353 04/23/12 0328 04/22/12 0330 04/21/12 0401  HGB 7.8* 8.1* 7.9* 7.9* 9.1*  HCT 24.8* 25.9* 25.4* 25.1* 29.4*  PLT 444* 478* 450* 442* 431*  INR -- -- -- -- --  APTT -- -- -- -- --   A:  Anemia of critical illness, chronic disease, and iron deficiency.  Required 1u  PRBC 8/9. P:  F/u CBC intermittently Added FeSO4 8/14 Transfuse for Hb < 7   INFECTIOUS  Lab 04/26/12 0355 04/24/12 0353 04/23/12 0328 04/22/12 0330 04/21/12 0401  WBC 16.3* 18.1* 24.6* 23.2* 23.9*  PROCALCITON -- -- -- -- --   Cultures: 8/1 Blood >>> negative 8/1 MRSA screen: negative   Antibiotics: Vancomycin 8/1 (sepsis , skin source)>>> 8/8 Zosyn 8/1 (sepsis, skin source)>>> 8/12  A:  Possible infected diabetic ulcer / sacral decub ulcer.  Possible aspiration tube feeds 8/17>>no evidence for infection. Leukocytosis>>likely from steroids. P:   F/U CBC Monitor off Abx   ENDOCRINE  Lab 04/26/12 0739 04/26/12 0354 04/26/12 0020 04/25/12 2010 04/25/12 1652  GLUCAP 160* 282* 269* 119* 84   A:  DM.  DKA secondary to noncompliance (  history of noncompliance).   Hypothyroidism. Hypoglycemic event 8/17. CBG (last 3)   P:   Synthroid Increased Lantus to 15 and ssi Diabetes coordinator assisting  NEUROLOGIC  Head CT:  8/1 >>> nad  A:  Acute encephalopathy, metabolic and due to sepsis>>resolved. P: PRN analgesia PT following   BEST PRACTICE / DISPOSITION Level of Care:  floor Primary Service:  PCCM Consultants:  None Code Status:  Limited >> no CPR/defib Diet: Tube feeds DVT Px:  SQ heparin GI Px:  PPI Skin Integrity:  As above - per  wound care consult Social / Family: No family at bedside  He was being evaluated for transfer to [facility]. Now that respiratory status improved, medically stable. 8/23 for transfer, CIR input as alternative discharge venue.  8/21 discussed with Dr Levonne Spiller from his insurance - they want to be sure that he will not be readmitted & all acute problems resolved before approving LTAC -peg deferred , WC has decreased & Hb stable, will need rpt swallow evaln in 3-5 ds.   Brett Canales Minor ACNP [facility] PCCM Pager [phone] till 3 pm If no answer page [phone] 04/26/2012, 10:23 AM   Independently examined pt, evaluated data & formulated above  care plan with NP, edited note  ALVA,RAKESH V.

## 2012-04-26 NOTE — Progress Notes (Signed)
 Gastroenterology Progress Note  Subjective:  Feels ok today.  No abdominal pain, nausea, vomiting.  Thinks that he is getting a little better each day.  Objective:  Vital signs in last 24 hours: Temp:  [95.5 F (35.3 C)-99.1 F (37.3 C)] 99.1 F (37.3 C) (08/23 0600) Pulse Rate:  [62-96] 96  (08/23 0600) Resp:  [16-22] 16  (08/23 0600) BP: (98-151)/(50-72) 110/58 mmHg (08/23 0600) SpO2:  [84 %-100 %] 100 % (08/23 0600) Weight:  [143 lb 4.8 oz (65 kg)] 143 lb 4.8 oz (65 kg) (08/23 0600) Last BM Date: 04/23/12 General:  Alert, thin, chronically-ill appearing, pleasant Heart:  Regular rate and rhythm; no murmurs Pulm:  Rales heard throughout B/L lung fields. Abdomen:  Soft, nontender and nondistended. Normal bowel sounds, without guarding, and without rebound.   Extremities:  Without edema.  Wounds with dressings on B/L lower extremities. Neurologic:  Alert and  oriented;  grossly normal neurologically. Psych:  Alert and cooperative. Normal mood and affect.  Intake/Output from previous day: 08/22 0701 - 08/23 0700 In: 900 [I.V.:500; NG/GT:300; IV Piggyback:100] Out: 900 [Urine:900]   Lab Results:  [name] 04/26/12 0355 04/24/12 0353  WBC 16.3* 18.1*  HGB 7.8* 8.1*  HCT 24.8* 25.9*  PLT 444* 478*   BMET  Basename 04/26/12 0355 04/24/12 0353  NA 131* 138  K 4.8 4.5  CL 96 101  CO2 28 31  GLUCOSE 292* 213*  BUN 32* 32*  CREATININE 0.76 0.74  CALCIUM 7.7* 8.2*   Assessment / Plan: -Multifactorial dysphagia seen on Barium Swallow study. Sounds mostly oropharyngeal/esophageal motility disorder by history. Patient indicates that he was having problems with swallowing and choking for several years at home. Patient may have slight improvement in his swallowing as he recovers from his acute issues, but it appears that this is going to be a chronic problem to some degree.  Patient did not tolerate EGD well from a pulmonary standpoint; if PEG needs to be placed in the  future would recommended placement by IR. -Esophageal candidiasis seen on EGD 8/22-biopsies taken; on IV Diflucan, continue 7-10 days -Gastric polyp-biopsied but no removed due to friability; could be contributing to his anemia -s/p VDRF secondary to acute respiratory failure/ARDS; pulmonary edema vs PNA -Anemia-Hgb stable. Multifactorial secondary to critical illness, chronic disease, and profound iron deficiency; s/p 1 unit of PRBC's on 8/9 with no overt sign of GIB. On iron supplements as well. Continue to monitor Hgb and transfuse prn.  -Diabetic/sacral decubitus ulcers-WOC following patient  -Leukocytosis-thought to be seconday to steroids; off antibiotics now. Improving.  -Hyponatremia-correction per primary service  *Will sign off from a GI standpoint.  Call if needed.   LOS: 22 days   [name] D.  04/26/2012, 9:06 AM  Pager number [phone]

## 2012-04-26 NOTE — Progress Notes (Signed)
[facility] picked up pt.  Tube feeding temporarily stopped.  IV infusion continued.  Assessment unchanged from this morning.    Sherron Monday

## 2012-04-26 NOTE — Progress Notes (Signed)
Occupational Therapy Treatment Patient Details Name: [name] MRN: [id number] DOB: [birth year] Today's Date: 04/26/2012 Time: 1440-1450 OT Time Calculation (min): 10 min  OT Assessment / Plan / Recommendation Comments on Treatment Session Pt required MAX cueing and encouragement just to get OOB and sit in bedside chair. Pt making slow progress with mobility.    Follow Up Recommendations  LTACH;Skilled nursing facility    Barriers to Discharge       Equipment Recommendations  Defer to next venue    Recommendations for Other Services    Frequency Min 2X/week   Plan Discharge plan remains appropriate    Precautions / Restrictions Precautions Precautions: Fall Precaution Comments: decubs on R ankle and sacrum.   Pertinent Vitals/Pain Denied pain    ADL  Toilet Transfer: Performed;Moderate assistance Toilet Transfer Method: Stand pivot Toilet Transfer Equipment: Other (comment) (to recliner.)    OT Diagnosis:    OT Problem List:   OT Treatment Interventions:     OT Goals ADL Goals ADL Goal: Toilet Transfer - Progress: Progressing toward goals  Visit Information  Last OT Received On: 04/26/12 Assistance Needed: +2 (for safety)    Subjective Data  Subjective: I don't feel like getting up right now.   Prior Functioning  Prior Function Driving: No Vocation: Retired Comments: Dtr assisted with bathing and pajamas in the evening.    Cognition  Overall Cognitive Status: Appears within functional limits for tasks assessed/performed Arousal/Alertness: Awake/alert Orientation Level: Appears intact for tasks assessed Behavior During Session: [facility] for tasks performed    Mobility Bed Mobility Supine to Sit: 4: Min assist;HOB elevated;With rails Sitting - Scoot to Edge of Bed: 5: Supervision Details for Bed Mobility Assistance: Increased time needed. Assist needed to bring trunk upright. Max VCs for hand placement throughout. Transfers Sit to Stand: 3: Mod assist;With  upper extremity assist;From bed Stand to Sit: 3: Mod assist;With armrests;With upper extremity assist;To chair/3-in-1 Details for Transfer Assistance: Physical A needed to rise and stabilize. VCs for hand placement, control descent to chair.   Exercises    Balance    End of Session OT - End of Session Activity Tolerance: Patient limited by fatigue Patient left: in chair;with call bell/phone within reach  GO     [name] A OTR/L [phone] 04/26/2012, 3:12 PM

## 2012-04-26 NOTE — Progress Notes (Signed)
Rehab admissions - I have authorization from insurance case manager and can admit to inpatient rehab.  Bed available and can admit today.  Call me for questions.  #[phone]

## 2012-04-26 NOTE — PMR Pre-admission (Signed)
PMR Admission Coordinator Pre-Admission Assessment  Patient: [name] is an 78 y.o., male MRN: [id number] DOB: 03-Nov-1928 Height: 5\' 9"  (175.3 cm) Weight: 65 kg (143 lb 4.8 oz)  Insurance Information HMO:      PPO:       PCP:       IPA:       80/20:       OTHER:  Group # [id number] PRIMARY: [employer]      Policy#: [id number]      Subscriber: [name] CM Name: Tilford Pillar      Phone#: [phone] Ext [phone]     Fax#: [phone] Pre-Cert#: [id number]      Employer: [employer] Benefits:  Phone #: [phone]     Name: Loura Pardon. Dolores Hoose. Date: 09/05/07     Deduct: $0      Out of Pocket Max: $3950(met $861.28)      Life Max: unlimited CIR: $265 per day max $1590      SNF: $0 1-7 days, $50 days 8-20, $110 days 21-100 Outpatient: No visit limits     Co-Pay: $40/visit Home Health: 100%      Co-Pay: none DME: 80%     Co-Pay: 20% Providers: in network  Emergency Contact Information Contact Information    Name Relation Home Work Mobile   [place] Spouse [phone]  [phone]   Esmond Camper Daughter   [phone]   Cher, Egnor   [phone]   Tetterton,James  [phone]     Rodney Booze  [phone]     Hill,Matt Nephew   [phone]     Current Medical History  Patient Admitting Diagnosis: Deconditioning related to sepsis and multiple medical complications/ metabolic encephalopathy   History of Present Illness: A36 y.o. right-handed male With diabetes mellitus and peripheral neuropathy coronary artery disease and congestive heart failure. Admitted 04/04/2012 with nausea, vomiting and altered mental status. Found to be hyperglycemic as well as acidotic. Cranial CT scan with chronic changes without acute abnormality. Patient was hypotensive secondary to dehydration with creatinine 1.41 and placed on intravenous fluids. Noted marginally elevated cardiac enzymes. Patient was intubated followed by critical care medicine. Followup cardiology services  Dr. Patty Sermons with EKG showing normal sinus rhythm right bundle branch block and left anterior fascicular block consistent with type II NSTEMI felt to be secondary to demand ischemia from his sepsis and hypotension. Patient was slowly extubated. Maintained on subcutaneous heparin for DVT prophylaxis. Wound care nurse consulted for multiple wounds most significant located on the right lateral malleolus and left lower buttocks with appropriate skin care recommended. Noted ongoing bouts of confusion altered mental status felt to be secondary to acute encephalopathy due to sepsis that has steadily improved. He is currently n.p.o. with nasogastric tube feeds as advised.  Past Medical History  Past Medical History  Diagnosis Date  . Diabetes mellitus   . Cancer   . Thyroid disease   . Anemia   . Hyperlipidemia   . Neuropathy, diabetic   . Diabetic retinopathy     Family History  family history includes Hypertension in an unspecified family member.  Prior Rehab/Hospitalizations:  No previous rehab admissions.  Current Medications  Current facility-administered medications:0.45 % sodium chloride infusion, , Intravenous, Continuous, Coralyn Helling, MD, Last Rate: 20 mL/hr at 04/26/12 0241;  albuterol (PROVENTIL) (5 MG/ML) 0.5% nebulizer solution 2.5 mg, 2.5 mg, Nebulization, Q2H PRN, Coralyn Helling, MD, 2.5 mg at 04/25/12 1051;  antiseptic oral rinse (BIOTENE) solution 15 mL, 15 mL, Mouth Rinse, QID, Lonia Farber, MD, 15 mL  at 04/26/12 1234 aspirin chewable tablet 81 mg, 81 mg, Oral, Daily, Coralyn Helling, MD, 81 mg at 04/26/12 1023;  chlorhexidine (PERIDEX) 0.12 % solution 15 mL, 15 mL, Mouth Rinse, BID, Lonia Farber, MD, 15 mL at 04/26/12 0753;  collagenase (SANTYL) ointment, , Topical, Daily, Lonia Farber, MD feeding supplement (OXEPA) liquid 1,000 mL, 1,000 mL, Per Tube, Continuous, Anastasio Champion, RD, Last Rate: 45 mL/hr at 04/26/12 0753, 1,000 mL at 04/26/12 0753;   feeding supplement (PRO-STAT SUGAR FREE 64) liquid 30 mL, 30 mL, Oral, BID WC, Anastasio Champion, RD, 30 mL at 04/26/12 1234;  ferrous sulfate 300 (60 FE) MG/5ML syrup 300 mg, 300 mg, Per Tube, TID, Coralyn Helling, MD, 300 mg at 04/26/12 1023 fluconazole (DIFLUCAN) IVPB 200 mg, 200 mg, Intravenous, Daily, Doug Sou, PA, 200 mg at 04/26/12 1024;  heparin injection 5,000 Units, 5,000 Units, Subcutaneous, Q8H, Coralyn Helling, MD, 5,000 Units at 04/26/12 1432;  HYDROcodone-acetaminophen (LORTAB) 7.5-500 MG/15ML solution 10 mL, 10 mL, Per Tube, Q4H PRN, Simonne Martinet, NP;  insulin aspart (novoLOG) injection 0-20 Units, 0-20 Units, Subcutaneous, Q4H, Coralyn Helling, MD, 4 Units at 04/26/12 1234 insulin glargine (LANTUS) injection 15 Units, 15 Units, Subcutaneous, QHS, Oretha Milch, MD, 15 Units at 04/25/12 2238;  ipratropium (ATROVENT) nebulizer solution 0.5 mg, 0.5 mg, Nebulization, Q2H PRN, Coralyn Helling, MD, 0.5 mg at 04/25/12 1052;  levothyroxine (SYNTHROID, LEVOTHROID) tablet 75 mcg, 75 mcg, Per Tube, QAC breakfast, Lonia Farber, MD, 75 mcg at 04/26/12 6213 metoprolol tartrate (LOPRESSOR) 25 mg/10 mL oral suspension 12.5 mg, 12.5 mg, Per Tube, BID, Coralyn Helling, MD, 12.5 mg at 04/26/12 1023;  predniSONE 5 MG/ML concentrated solution 60 mg, 60 mg, Per Tube, Q breakfast, Lonia Farber, MD;  ranitidine (ZANTAC) 150 MG/10ML syrup 150 mg, 150 mg, Oral, BID, Lonia Farber, MD, 150 mg at 04/26/12 1024 DISCONTD: free water 100 mL, 100 mL, Per Tube, Q8H, Oretha Milch, MD, 100 mL at 04/26/12 0600;  DISCONTD: predniSONE 5 MG/ML concentrated solution 40 mg, 40 mg, Per Tube, BID WC, Lonia Farber, MD, 40 mg at 04/26/12 0753;  DISCONTD: predniSONE 5 MG/ML concentrated solution 60 mg, 60 mg, Per Tube, Daily, Oretha Milch, MD Facility-Administered Medications Ordered in Other Encounters: etomidate (AMIDATE) injection, , , PRN, Illene Silver, CRNA, 12 mg at 04/17/12 0242;   succinylcholine (ANECTINE) injection, , , PRN, Illene Silver, CRNA, 80 mg at 04/17/12 0242  Patients Current Diet: NPO  Precautions / Restrictions Precautions Precautions: Fall Precaution Comments: decubs on R ankle and sacrum Restrictions Weight Bearing Restrictions: No   Prior Activity Level Household: Stayed at home most of the time.  Went to MD appts.   Home Assistive Devices / Equipment Home Assistive Devices/Equipment: Dan Humphreys (specify type) Home Adaptive Equipment: Walker - rolling;Tub transfer bench;Bedside commode/3-in-1  Prior Functional Level Prior Function Level of Independence: Independent with assistive device(s) Able to Take Stairs?: Yes Driving: No Vocation: Retired Comments: Dtr assisted with bathing and pajamas in the evening.  Current Functional Level Cognition  Arousal/Alertness: Awake/alert Overall Cognitive Status: No family/caregiver present to determine baseline cognitive functioning Orientation Level: Oriented X4 Cognition - Other Comments: oriented to month, day of week, and year    Extremity Assessment (includes Sensation/Coordination)  RUE ROM/Strength/Tone: Within functional levels  RLE ROM/Strength/Tone: WFL for tasks assessed RLE Sensation: WFL - Light Touch    ADLs  Eating/Feeding: NPO Grooming: Simulated;Set up Where Assessed - Grooming: Supported sitting Upper Body Bathing: Simulated;Set up Where Assessed - Upper Body Bathing:  Supported sitting Lower Body Bathing: Simulated;+2 Total assistance Lower Body Bathing: Patient Percentage: 60% Where Assessed - Lower Body Bathing: Supported sit to stand Upper Body Dressing: Performed;Set up Where Assessed - Upper Body Dressing: Unsupported sitting Lower Body Dressing: Simulated;+2 Total assistance Lower Body Dressing: Patient Percentage: 20% Where Assessed - Lower Body Dressing: Supported sit to stand Toilet Transfer: Simulated;+2 Total assistance Toilet Transfer: Patient Percentage:  60% Statistician Method: Surveyor, minerals: Other (comment) (to recliner) Toileting - Clothing Manipulation and Hygiene: Simulated;+2 Total assistance Toileting - Clothing Manipulation and Hygiene: Patient Percentage:  (25%) Where Assessed - Toileting Clothing Manipulation and Hygiene: Sit to stand from 3-in-1 or toilet Equipment Used: Rolling walker ADL Comments: Pt only agreeable to bed>chair transfer. Had just refused a bath from nurse techs.    Mobility  Bed Mobility: Supine to Sit Supine to Sit: 4: Min assist;HOB elevated;With rails Sitting - Scoot to Edge of Bed: 4: Min assist    Transfers  Transfers: Stand to Sit;Sit to Stand Sit to Stand: 3: Mod assist;From bed;From elevated surface;With upper extremity assist Sit to Stand: Patient Percentage: 80% Stand to Sit: 3: Mod assist;With upper extremity assist;With armrests;To chair/3-in-1 Stand to Sit: Patient Percentage: 80% Stand Pivot Transfers: 1: +2 Total assist Stand Pivot Transfers: Patient Percentage: 60%    Ambulation / Gait / Stairs / Wheelchair Mobility  Ambulation/Gait Ambulation/Gait Assistance: 1: +2 Total assist Ambulation/Gait: Patient Percentage: 70% Ambulation Distance (Feet): 15 Feet Assistive device: Rolling walker Ambulation/Gait Assistance Details: VCs safety, posture, distance from RW. Assist to stabilize throughout ambulation and maneuver with RW. Followed with recliner. Fatigues easily.  Gait Pattern: Decreased stride length;Decreased step length - right;Decreased step length - left;Step-through pattern Gait velocity: decreased    Posture / Balance Static Sitting Balance Static Sitting - Balance Support: Feet supported;No upper extremity supported Static Sitting - Level of Assistance: 5: Stand by assistance     Previous Home Environment Living Arrangements: Spouse/significant other Lives With: Spouse Available Help at Discharge: Family Type of Home: House Home Layout: One  level Home Access: Stairs to enter Entrance Stairs-Rails: Right;Left;Can reach both Entrance Stairs-Number of Steps: 2 Bathroom Shower/Tub: Engineer, manufacturing systems: Standard Home Care Services: No  Discharge Living Setting Plans for Discharge Living Setting: Patient's home;House;Lives with (comment) Type of Home at Discharge: House Discharge Home Layout: One level Discharge Home Access: Stairs to enter Entrance Stairs-Number of Steps: 3 Do you have any problems obtaining your medications?: No  Social/Family/Support Systems Patient Roles: Spouse;Parent Contact Information: Esmond Camper - Dtr;  Merilynn [place] - wife Anticipated Caregiver: wife and dtr Anticipated Caregiver's Contact Information: [name] dtr [phone]; Radene Knee - wife [phone] Ability/Limitations of Caregiver: Wife can provide supervision but not physical assist.  Dtr works days and helps pms. Caregiver Availability: 24/7 (Dtr in group home may come assist with patient.) Discharge Plan Discussed with Primary Caregiver: Yes Is Caregiver In Agreement with Plan?: Yes Does Caregiver/Family have Issues with Lodging/Transportation while Pt is in Rehab?: No  Goals/Additional Needs Patient/Family Goal for Rehab: PT/OT/ST S/Min A goals Expected length of stay: 2-3 weeks Cultural Considerations: None Dietary Needs: NPO with panda tube feedings Equipment Needs: TBD Pt/Family Agrees to Admission and willing to participate: Yes Program Orientation Provided & Reviewed with Pt/Caregiver Including Roles  & Responsibilities: Yes  Patient Condition: This patient's condition remains as documented in the Consult dated 04/24/12, in which the Rehabilitation Physician determined and documented that the patient's condition is appropriate for intensive rehabilitative care in an inpatient  rehabilitation facility.  Preadmission Screen Completed By:  Trish Mage, 04/26/2012 2:41  PM ______________________________________________________________________   Discussed status with Dr. Riley Kill on 04/26/12 at 1425 and received telephone approval for admission today.  Admission Coordinator:  Trish Mage, time 1453/Date08/23/13

## 2012-04-26 NOTE — Telephone Encounter (Signed)
Spoke with [employer]. She states has already spoke to Dr. Vassie Loll and there is nothing further needed here. Will close encounter.

## 2012-04-26 NOTE — Discharge Summary (Signed)
Physician Discharge Summary  Patient ID: [name] MRN: [id number] DOB/AGE: 1929-04-05 78 y.o.  Admit date: 04/04/2012 Discharge date: 04/26/2012  Problem List Active Problems:  Hypertension  Diabetes mellitus  Hypothyroidism  Anemia  Diabetes with ulcer of foot  Sacral decubitus ulcer  Non-ST elevation myocardial infarction (NSTEMI), initial episode of care  Pulmonary infiltrates  Dysphagia  Physical deconditioning  Systolic dysfunction  Hypernatremia  Benign neoplasm of stomach  HPI: 78 yo diabetic admit from home via [facility] ED with nausea, vomiting and altered mental status. Treated for DKA but course also significant for sepsis due to skin source vs PNA, NSTEMI with shock (septic + cardiogenic), resp failure.   Hospital Course:  Events:  8/17 Transfer back to ICU for hypoxia>>?displacement of panda and aspiration of tube feeds  8/20 pulled panda out, failed swallow,  8/22 EGD  8/23 await transfer to LTAC  Current Status:  Breathing improved. Alert, denies pain  Wants something to eat  Vital Signs:  Temp: [95.5 F (35.3 C)-99.1 F (37.3 C)] 99.1 F (37.3 C) (08/23 0600)  Pulse Rate: [65-96] 96 (08/23 0600)  Resp: [16] 16 (08/23 0600)  BP: (98-110)/(50-61) 110/58 mmHg (08/23 0600)  SpO2: [98 %-100 %] 100 % (08/23 0600)  Weight: [143 lb 4.8 oz (65 kg)] 143 lb 4.8 oz (65 kg) (08/23 0600)    Intake/Output Summary (Last 24 hours) at 04/26/12 1023 Last data filed at 04/26/12 0600   Gross per 24 hour   Intake  900 ml   Output  900 ml   Net  0 ml   NRB  Physical Examination:  General: awake  Neuro: follows commands  HEENT: No sinus tenderness  Neck: Supple  Cardiovascular: s1s2 regular, no murmur  Lungs: congested cough, poor cough, rhonchi throughout , on O2  Abdomen: Soft, non tender, bowel sounds present  Musculoskeletal: no edema  Skin: R foot ulcer, sacral decubitus ulcer  Dg Chest Port 1 View  04/26/2012 *RADIOLOGY REPORT* Clinical Data: Cough.  Congestion. Post EGD. Diabetes. PORTABLE CHEST - 1 VIEW Comparison: 04/25/2012 Findings: Feeding tube extends beyond the inferior aspect of the film. Midline trachea. Borderline cardiomegaly. No pleural effusion or pneumothorax. No significant change in bilateral perihilar interstitial and airspace disease. IMPRESSION: No change in interstitial and airspace opacities bilaterally. Pulmonary edema and/or infection. Original Report Authenticated By: Consuello Bossier, M.D.  Dg Chest Port 1 View  04/25/2012 *RADIOLOGY REPORT* Clinical Data: Pneumonia. Airspace disease. Diabetes. Prostate cancer. PORTABLE CHEST - 1 VIEW Comparison: 04/22/2012 Findings: Feeding tube extends down into the stomach. Bilateral airspace and interstitial opacities are still present, although improved compared to the prior exam. Confluence noted at the left lung base. Heart size is within normal limits. IMPRESSION: 1. Bilateral interstitial and patchy airspace opacities, mildly improved from prior, potentially reflecting bilateral pneumonia or pulmonary edema. Original Report Authenticated By: Dellia Cloud, M.D.   ASSESSMENT AND PLAN  PULMONARY  ABG    Component  Value  Date/Time    PHART  7.366  04/20/2012 0624    PCO2ART  57.1*  04/20/2012 0624    PO2ART  67.5*  04/20/2012 0624    HCO3  31.9*  04/20/2012 0624    TCO2  30.1  04/20/2012 0624    ACIDBASEDEF  3.5*  04/09/2012 0400    O2SAT  90.7  04/20/2012 0624    ETT: 8/2>>>8/4  ETT: 8/5>>>8/15  A: Acute respiratory failure in setting of septic shock skin and DKA. Persistent pulmonary infiltrates likely from fibro-proliferative ARDS >>Improved  after adding steroids 8/13. Worsening hypoxia 8/17 with ?aspiration of tube feeds>>improved 8/18.  ESR 131 from 8/13 >> 112 (8/19)  8/22 post EGD very congested and desaturations noted  P:  Drop PO pred 60 daily, rpt ESR remains high(slow wean)  F/u CXR intermittently q 2-3 ds  Titrate oxygen to keep SpO2 > 90%  Keep in even fluid  balance  CARDIOVASCULAR  No results found for this basename: TROPONINI:5,LATICACIDVEN:5, O2SATVEN:5,PROBNP:5 in the last 168 hours  Lines: CVL L [place] 8/2>>8/14  Echo 8/02>>EF 40 to 45%, diffuse hypokinesis, grade 1 diastolic dysfx, mod RV systolic dysfx  A: Septic shock>>resolved.  NSTEMI and superimposed cardiogenic shock>>shock resolved.  Acute combined CHF with acute pulmonary edema>>improved.  Cardiology signed off 8/07>>recommended medical management.  P:  Restarted ASA 8/13 (held due to concern for GI bleeding)  Continue lopressor  RENAL   Lab  04/26/12 0355  04/24/12 0353  04/23/12 0328  04/22/12 0330  04/21/12 0401   NA  131*  138  141  145  147*   K  4.8  4.5  --  --  --   CL  96  101  105  107  107   CO2  28  31  33*  34*  35*   BUN  32*  32*  38*  47*  51*   CREATININE  0.76  0.74  0.75  0.80  0.84   CALCIUM  7.7*  8.2*  8.3*  8.3*  8.4   MG  --  --  --  --  --   PHOS  --  --  --  --  --    Intake/Output  08/22 0701 - 08/23 0700 08/23 0701 - 08/24 0700  I.V. (mL/kg) 500 (7.7)  NG/GT 300  IV Piggyback 100  Total Intake(mL/kg) 900 (13.8)  Urine (mL/kg/hr) 900 (0.6)  Total Output 900  Net 0  Urine Occurrence 2 x   Foley: 8/1 >>>  A: Mild hypernatremia -resolved, now hyponatremic  P:  F/u BMET, dc free water flushes  GASTROINTESTINAL   Lab  04/20/12 0851   AST  25   ALT  14   ALKPHOS  92   BILITOT  0.2*   PROT  6.4   ALBUMIN  1.6*    A: ileus >>resolved.  Diarrhea (C diff negative 8/12)>>improved.  Nutrition, protein calorie malnutrition.  Dysphagia  8/22 egd results: (gastric polyp with intestinal metaplasia and dysplasia found in 2009 )  1. esophageal candidiasis; multiple biopsies  2. Pedunculated polyp measuring 1.5 cm in size in the gastric antrum; multiple biopsies  3. Atrophic gastritis  4. Duodenitis  P:  Defer PEG for now-although dysphagia appears to be long standing, some improvement expected  Continue Tube feeds, Reassess swallow next week  after esophogeal infection treated  HEMATOLOGIC   Lab  04/26/12 0355  04/24/12 0353  04/23/12 0328  04/22/12 0330  04/21/12 0401   HGB  7.8*  8.1*  7.9*  7.9*  9.1*   HCT  24.8*  25.9*  25.4*  25.1*  29.4*   PLT  444*  478*  450*  442*  431*   INR  --  --  --  --  --   APTT  --  --  --  --  --    A: Anemia of critical illness, chronic disease, and iron deficiency. Required 1u PRBC 8/9.  P:  F/u CBC intermittently  Added FeSO4 8/14  Transfuse for Hb < 7  INFECTIOUS   Lab  04/26/12 0355  04/24/12 0353  04/23/12 0328  04/22/12 0330  04/21/12 0401   WBC  16.3*  18.1*  24.6*  23.2*  23.9*   PROCALCITON  --  --  --  --  --    Cultures:  8/1 Blood >>> negative  8/1 MRSA screen: negative  Antibiotics:  Vancomycin 8/1 (sepsis , skin source)>>> 8/8  Zosyn 8/1 (sepsis, skin source)>>> 8/12  A: Possible infected diabetic ulcer / sacral decub ulcer. Possible aspiration tube feeds 8/17>>no evidence for infection.  Leukocytosis>>likely from steroids.  P:  F/U CBC  Monitor off Abx  ENDOCRINE   Lab  04/26/12 0739  04/26/12 0354  04/26/12 0020  04/25/12 2010  04/25/12 1652   GLUCAP  160*  282*  269*  119*  84    A: DM. DKA secondary to noncompliance (history of noncompliance).  Hypothyroidism.  Hypoglycemic event 8/17.  CBG (last 3)  P:  Synthroid  Increased Lantus to 15 and ssi  Diabetes coordinator assisting  NEUROLOGIC  Head CT: 8/1 >>> nad  A: Acute encephalopathy, metabolic and due to sepsis>>resolved.  P:  PRN analgesia  PT following  BEST PRACTICE / DISPOSITION  Level of Care: floor  Primary Service: PCCM  Consultants: None  Code Status: Limited >> no CPR/defib  Diet: Tube feeds  DVT Px: SQ heparin  GI Px: PPI  Skin Integrity: As above - per wound care consult  Social / Family: No family at bedside  He was being evaluated for transfer to [facility]. Now that respiratory status improved, medically stable. 8/23 for transfer, CIR input as alternative discharge venue.  8/21  discussed with Dr Levonne Spiller from his insurance - they want to be sure that he will not be readmitted & all acute problems resolved before approving LTAC -peg deferred , WC has decreased & Hb stable, will need rpt swallow evaln in 3-5 ds.  8/23 to be admitted to rehabilitation unit.     Labs at discharge Lab Results  Component Value Date   CREATININE 0.76 04/26/2012   BUN 32* 04/26/2012   NA 131* 04/26/2012   K 4.8 04/26/2012   CL 96 04/26/2012   CO2 28 04/26/2012   Lab Results  Component Value Date   WBC 16.3* 04/26/2012   HGB 7.8* 04/26/2012   HCT 24.8* 04/26/2012   MCV 100.0 04/26/2012   PLT 444* 04/26/2012   Lab Results  Component Value Date   ALT 14 04/20/2012   AST 25 04/20/2012   ALKPHOS 92 04/20/2012   BILITOT 0.2* 04/20/2012   Lab Results  Component Value Date   INR 1.15 04/05/2012    Current radiology studies Dg Chest Port 1 View  04/26/2012  *RADIOLOGY REPORT*  Clinical Data: Cough.  Congestion.  Post EGD.  Diabetes.  PORTABLE CHEST - 1 VIEW  Comparison: 04/25/2012  Findings: Feeding tube extends beyond the  inferior aspect of the film.  Midline trachea.   Borderline cardiomegaly.     No pleural effusion or pneumothorax.  No significant change in bilateral perihilar interstitial and airspace disease.  IMPRESSION: No change in interstitial and airspace opacities bilaterally. Pulmonary edema and/or infection.   Original Report Authenticated By: Consuello Bossier, M.D.    Dg Chest Port 1 View  04/25/2012  *RADIOLOGY REPORT*  Clinical Data: Pneumonia.  Airspace disease.  Diabetes.  Prostate cancer.  PORTABLE CHEST - 1 VIEW  Comparison: 04/22/2012  Findings: Feeding tube extends down into the stomach.  Bilateral airspace  and interstitial opacities are still present, although improved compared to the prior exam.  Confluence noted at the left lung base.  Heart size is within normal limits.  IMPRESSION:  1.  Bilateral interstitial and patchy airspace opacities, mildly improved from prior,  potentially reflecting bilateral pneumonia or pulmonary edema.   Original Report Authenticated By: Dellia Cloud, M.D.     Disposition:  01-Home or Self Care  Discharge Orders    Future Orders Please Complete By Expires   Discharge to SNF when bed available        Medication List  As of 04/26/2012  2:50 PM   STOP taking these medications         ferrous sulfate 325 (65 FE) MG tablet      folic acid 1 MG tablet      insulin aspart protamine-insulin aspart (70-30) 100 UNIT/ML injection      levothyroxine 50 MCG tablet      ranitidine 150 MG tablet      simvastatin 20 MG tablet      Tamsulosin HCl 0.4 MG Caps      vitamin B-12 1000 MCG tablet      vitamin C 500 MG tablet         TAKE these medications         albuterol (5 MG/ML) 0.5% nebulizer solution   Commonly known as: PROVENTIL   Take 0.5 mLs (2.5 mg total) by nebulization every 2 (two) hours as needed for wheezing or shortness of breath.      aspirin 81 MG chewable tablet   Chew 1 tablet (81 mg total) by mouth daily.      collagenase ointment   Commonly known as: SANTYL   Apply topically daily.      feeding supplement (OXEPA) Liqd   Place 1,000 mLs into feeding tube continuous.      feeding supplement Liqd   Take 30 mLs by mouth 2 (two) times daily with breakfast and lunch.      ferrous sulfate 300 (60 FE) MG/5ML syrup   Place 5 mLs (300 mg total) into feeding tube 3 (three) times daily.      fluconazole 2 mg/mL IVPB   Commonly known as: DIFLUCAN   Inject 100 mLs (200 mg total) into the vein daily.      insulin aspart 100 UNIT/ML injection   Commonly known as: novoLOG   Inject 0-20 Units into the skin every 4 (four) hours.      insulin glargine 100 UNIT/ML injection   Commonly known as: LANTUS   Inject 15 Units into the skin at bedtime.      ipratropium 0.02 % nebulizer solution   Commonly known as: ATROVENT   Take 2.5 mLs (0.5 mg total) by nebulization every 2 (two) hours as needed.        levothyroxine 75 MCG tablet   Commonly known as: SYNTHROID, LEVOTHROID   Place 1 tablet (75 mcg total) into feeding tube daily before breakfast.      metoprolol tartrate 25 mg/10 mL Susp   Commonly known as: LOPRESSOR   Place 5 mLs (12.5 mg total) into feeding tube 2 (two) times daily.      predniSONE 5 MG/ML concentrated solution   Place 12 mLs (60 mg total) into feeding tube daily with breakfast.      ranitidine 150 MG/10ML syrup   Commonly known as: ZANTAC   Take 10 mLs (150 mg total) by mouth 2 (two) times daily.  sodium chloride 0.45 % solution   Inject 20 mLs into the vein continuous.             Discharged Condition: fair Vital signs at Discharge. Temp:  [97.8 F (36.6 C)-99.1 F (37.3 C)] 98.3 F (36.8 C) (08/23 1406) Pulse Rate:  [65-96] 67  (08/23 1406) Resp:  [16-18] 18  (08/23 1406) BP: (98-123)/(50-61) 123/54 mmHg (08/23 1406) SpO2:  [99 %-100 %] 99 % (08/23 1406) Weight:  [143 lb 4.8 oz (65 kg)] 143 lb 4.8 oz (65 kg) (08/23 0600) Office follow up Special Information or instructions. Per Rehabilatation. Signed: Brett Canales Minor ACNP Adolph Pollack PCCM Pager [phone] till 3 pm If no answer page [phone] 04/26/2012, 2:50 PM

## 2012-04-26 NOTE — Progress Notes (Signed)
Pt. Admitted to rehab at 1700 from [facility].  AOX3; VSS (see flowsheet); no complaints of pain.  Call bell in reach.

## 2012-04-27 ENCOUNTER — Inpatient Hospital Stay (HOSPITAL_COMMUNITY): Payer: Medicare Other | Admitting: Occupational Therapy

## 2012-04-27 ENCOUNTER — Inpatient Hospital Stay (HOSPITAL_COMMUNITY): Payer: Medicare Other | Admitting: Physical Therapy

## 2012-04-27 ENCOUNTER — Inpatient Hospital Stay (HOSPITAL_COMMUNITY): Payer: Medicare Other | Admitting: Speech Pathology

## 2012-04-27 DIAGNOSIS — R1310 Dysphagia, unspecified: Secondary | ICD-10-CM

## 2012-04-27 DIAGNOSIS — B3781 Candidal esophagitis: Secondary | ICD-10-CM

## 2012-04-27 DIAGNOSIS — A4189 Other specified sepsis: Secondary | ICD-10-CM

## 2012-04-27 DIAGNOSIS — Z5189 Encounter for other specified aftercare: Secondary | ICD-10-CM

## 2012-04-27 DIAGNOSIS — R5381 Other malaise: Secondary | ICD-10-CM

## 2012-04-27 LAB — GLUCOSE, CAPILLARY
Glucose-Capillary: 279 mg/dL — ABNORMAL HIGH (ref 70–99)
Glucose-Capillary: 193 mg/dL — ABNORMAL HIGH (ref 70–99)
Glucose-Capillary: 206 mg/dL — ABNORMAL HIGH (ref 70–99)

## 2012-04-27 MED ORDER — OXEPA PO LIQD
1000.0000 mL | ORAL | Status: DC
  Administered 2012-04-27 – 2012-04-28 (×2): 1000 mL
  Filled 2012-04-27 (×5): qty 1000

## 2012-04-27 MED ORDER — BIOTENE DRY MOUTH MT LIQD
15.0000 mL | OROMUCOSAL | Status: DC | PRN
  Administered 2012-04-28: 15 mL via OROMUCOSAL

## 2012-04-27 NOTE — Progress Notes (Signed)
Occupational Therapy Session Note  Patient Details  Name: [name] MRN: [id number] Date of Birth: August 09, 1929  Today's Date: 04/27/2012 Time: 8295-6213 Time Calculation (min): 44 min   Skilled Therapeutic Interventions/Progress Updates:  Worked on transition from supine to sit with mod facilitation.  Pt initially sleeping when therapist came in so took increased time for him to sit up.  Needed min assist to maintain initial static sitting balance but progressed to close supervision once he sat for a few seconds.  Performed toilet transfer with RW, ambulating to the bathroom with his RW.  Overall needed mod facilitation with decreased ability to stay inside of the walker and not squaring his body and the walker up to the surface he was transferring to.  Ambulated back to the wheelchair beside of the bed as well with mod facilitation.  Discussed overall rehab plan and outlook from OT standpoint.   Therapy Documentation Precautions:  Precautions Precautions: Fall Precaution Comments: NG tube, multiple decubitus lcers to sacrum and lower legs Required Braces or Orthoses: Other Brace/Splint Other Brace/Splint: pressure relieving heel protectors when in bed Restrictions Weight Bearing Restrictions: No  Pain: Pain Assessment Pain Assessment: No/denies pain Faces Pain Scale: No hurt Critical Care Pain Observation Tool (CPOT) Facial Expression: Relaxed, neutral ADL:  See FIM for current functional status  Therapy/Group: Individual Therapy  Ramzy Cappelletti OTR/L 04/27/2012, 4:37 PM

## 2012-04-27 NOTE — Progress Notes (Signed)
Nutrition Follow-up  Intervention:   1. Increase TF rate Oxepa at 55 ml/hr which provides:  1980 kcal, 83 gm protein daily.  Free water 200 ml every 6 hours. 2.  RD to follow for nutrition plan of care.  Assessment:   Seen by SLP who recommended pt remain NPO.  Tolerating TF.                           RN reports unable to deliver prostat thru panda due to thickness and tube clogging Diet Order:  NPO  Meds: Scheduled Meds:    . aspirin  81 mg Oral Daily  . collagenase   Topical Daily  . ferrous sulfate  300 mg Per Tube TID  . fluconazole (DIFLUCAN) IV  200 mg Intravenous Daily  . heparin subcutaneous  5,000 Units Subcutaneous Q8H  . insulin aspart  0-20 Units Subcutaneous Q4H  . insulin glargine  15 Units Subcutaneous QHS  . levothyroxine  75 mcg Per Tube QAC breakfast  . metoprolol tartrate  12.5 mg Per Tube BID  . predniSONE  60 mg Per Tube Q breakfast  . ranitidine  150 mg Oral BID   Continuous Infusions:    . feeding supplement (OXEPA) 1,000 mL (04/26/12 2257)   PRN Meds:.albuterol, HYDROcodone-acetaminophen, ipratropium, ondansetron (ZOFRAN) IV, ondansetron  Labs:  CMP     Component Value Date/Time   NA 131* 04/26/2012 0355   K 4.8 04/26/2012 0355   CL 96 04/26/2012 0355   CO2 28 04/26/2012 0355   GLUCOSE 292* 04/26/2012 0355   BUN 32* 04/26/2012 0355   CREATININE 0.76 04/26/2012 0355   CALCIUM 7.7* 04/26/2012 0355   PROT 6.4 04/20/2012 0851   ALBUMIN 1.6* 04/20/2012 0851   AST 25 04/20/2012 0851   ALT 14 04/20/2012 0851   ALKPHOS 92 04/20/2012 0851   BILITOT 0.2* 04/20/2012 0851   GFRNONAA 83* 04/26/2012 0355   GFRAA >90 04/26/2012 0355     Intake/Output Summary (Last 24 hours) at 04/27/12 0852 Last data filed at 04/27/12 0153  Gross per 24 hour  Intake      0 ml  Output   1000 ml  Net  -1000 ml    Weight Status:  Weight decreased from 165# 4 days  ago.  Overall stable. Wt Readings from Last 3 Encounters:  04/26/12 143 lb 4.8 oz (65 kg)  04/26/12 143 lb 4.8 oz  (65 kg)  11/22/11 138 lb 0.1 oz (62.6 kg)    Re-estimated needs:  1900- 2100 kcal, 90-110 grams of protein  Nutrition Dx:  Inadequate Oral Intake, ongoing  Goal:   1. Positive tolerance of TF, -Meeting, continue.  2. Meet > 90% of estimated energy needs with nutrition support. -Meeting, continue.    Monitor:  Diet advancement, TF tolerance, weights, labs, skin   Elisabeth Cara M.Odis Luster LDN Neonatal Nutrition Support Specialist Pager [phone]

## 2012-04-27 NOTE — Evaluation (Signed)
Speech Language Pathology Assessment and Plan  Patient Details  Name: [name] MRN: [id number] Date of Birth: 10-02-1928  SLP Diagnosis: Dysphagia;Cognitive Impairments  Rehab Potential: Good ELOS: 3 weeks   Today's Date: 04/27/2012 Time: 0800-0900 Time Calculation (min): 60 min  Skilled Therapeutic Intervention: Administered cognitive-linguistic evaluation and BSE, please see below for details.   Problem List:  Patient Active Problem List  Diagnosis  . Weakness  . Hypertension  . Diabetes mellitus  . Prostate cancer  . Hypothyroidism  . Hypokalemia  . Anemia  . Uncontrolled diabetes mellitus  . Cellulitis of lower leg  . Ulcer of leg due to secondary diabetes mellitus  . Diabetes with ulcer of foot  . Sacral decubitus ulcer  . Non-ST elevation myocardial infarction (NSTEMI), initial episode of care  . Pulmonary infiltrates  . Dysphagia  . Physical deconditioning  . Systolic dysfunction  . Hypernatremia  . Benign neoplasm of stomach   Past Medical History:  Past Medical History  Diagnosis Date  . Diabetes mellitus   . Cancer   . Thyroid disease   . Anemia   . Hyperlipidemia   . Neuropathy, diabetic   . Diabetic retinopathy    Past Surgical History:  Past Surgical History  Procedure Date  . Cataract extraction, bilateral   . Colonoscopy w/ polypectomy     Assessment / Plan / Recommendation Clinical Impression  78 y.o male with diabetes mellitus and peripheral neuropathy coronary artery disease and congestive heart failure. Patient noted to have poor medical compliance. Admitted 04/04/2012 with nausea, vomiting and altered mental status. Found to be hyperglycemic as well as acidotic. Cranial CT scan with chronic changes without acute abnormality. Noted marginally elevated cardiac enzymes. Patient was intubated followed by critical care medicine. Followup cardiology services Dr. Patty Sermons with EKG showing normal sinus rhythm right bundle branch block and left  anterior fascicular block consistent with type II NSTEMI felt to be secondary to demand ischemia from his sepsis and hypotension. Echocardiogram with ejection fraction of 45%, diffuse hypokinesis and grade 1 diastolic dysfunction. Patient was slowly extubated. Noted ongoing bouts of confusion altered mental status felt to be secondary to acute encephalopathy due to sepsis that has steadily improved. Noted dysphagia questionable aspiration and nasogastric tube was placed for nutritional support. EGD. completed 04/25/2012 for further evaluation and diagnosis of dysphagia per Dr. Russella Dar that showed esophageal candidiasis and atrophic gastritis and placed on Diflucan x7 days. Pt transferred to CIR 04/26/12 and presents with mild cognitive impairments characterized by decreased working memory and moderate dysphagia. Pt had MBS 04/23/12 and presented with moderate oral-pharyngeal and esophageal dysphagia, however, dysphagia felt to be primarily esophageal dysphagia and recommended to continue NPO status. Pt administered BSE today and presents with intermittent wet vocal quality and cough throughout evaluation. Pt administered trials of ice chips and thin liquids via spoon and cup without overt s/s of aspiration. Recommend pt continue NPO status and plan for f/u MBSS 04/30/12. Pt would benefit from skilled SLP intervention to maximize cognitive and swallow function and overall independence.     SLP Assessment  Patient will need skilled Speech Lanaguage Pathology Services during CIR admission    Recommendations  Follow up Recommendations:  (TBD) Equipment Recommended: None recommended by SLP    SLP Frequency 1-2 X/day, 30-60 minutes   SLP Treatment/Interventions Cognitive remediation/compensation;Cueing hierarchy;Internal/external aids;Dysphagia/aspiration precaution training;Environmental controls;Functional tasks;Patient/family education;Therapeutic Activities;Oral motor exercises    Pain  No/Denies  Pain  Short Term Goals: Week 1: SLP Short Term Goal 1 (Week 1):  Pt will consume trials of thin liquids via spoon without overt s/s of aspiration with 75% of trials SLP Short Term Goal 2 (Week 1): Pt will consume trials of puree textures without overt s/s of aspiration with 75% of trials. SLP Short Term Goal 3 (Week 1): Pt will recall new, daily information with supervision verbal and visual cues.   See FIM for current functional status Refer to Care Plan for Long Term Goals  Recommendations for other services: None  Discharge Criteria: Patient will be discharged from SLP if patient refuses treatment 3 consecutive times without medical reason, if treatment goals not met, if there is a change in medical status, if patient makes no progress towards goals or if patient is discharged from hospital.  The above assessment, treatment plan, treatment alternatives and goals were discussed and mutually agreed upon: by patient  [name] 04/27/2012, 11:06 AM

## 2012-04-27 NOTE — Evaluation (Signed)
Physical Therapy Assessment and Plan  Patient Details  Name: [name] MRN: [id number] Date of Birth: 01/05/1929  PT Diagnosis: Difficulty walking and Muscle weakness Rehab Potential: Good ELOS: 2 weeks   Today's Date: 04/27/2012 Time: 1400-1500 Time Calculation (min): 60 min  Problem List:  Patient Active Problem List  Diagnosis  . Weakness  . Hypertension  . Diabetes mellitus  . Prostate cancer  . Hypothyroidism  . Hypokalemia  . Anemia  . Uncontrolled diabetes mellitus  . Cellulitis of lower leg  . Ulcer of leg due to secondary diabetes mellitus  . Diabetes with ulcer of foot  . Sacral decubitus ulcer  . Non-ST elevation myocardial infarction (NSTEMI), initial episode of care  . Pulmonary infiltrates  . Dysphagia  . Physical deconditioning  . Systolic dysfunction  . Hypernatremia  . Benign neoplasm of stomach    Past Medical History:  Past Medical History  Diagnosis Date  . Diabetes mellitus   . Cancer   . Thyroid disease   . Anemia   . Hyperlipidemia   . Neuropathy, diabetic   . Diabetic retinopathy    Past Surgical History:  Past Surgical History  Procedure Date  . Cataract extraction, bilateral   . Colonoscopy w/ polypectomy     Assessment & Plan Clinical Impression: [name] is a 78 y.o. right-handed male with diabetes mellitus and peripheral neuropathy coronary artery disease and congestive heart failure. Patient noted to have poor medical compliance. Admitted 04/04/2012 with nausea, vomiting and altered mental status. Found to be hyperglycemic as well as acidotic. Cranial CT scan with chronic changes without acute abnormality. Patient was hypotensive secondary to dehydration with creatinine 1.41 and placed on intravenous fluids. Noted marginally elevated cardiac enzymes. Patient was intubated followed by critical care medicine. Followup cardiology services Dr. Patty Sermons with EKG showing normal sinus rhythm right bundle branch block and left  anterior fascicular block consistent with type II NSTEMI felt to be secondary to demand ischemia from his sepsis and hypotension. Echocardiogram with ejection fraction of 45%, diffuse hypokinesis and grade 1 diastolic dysfunction. Patient was slowly extubated. Maintained on subcutaneous heparin for DVT prophylaxis. Wound care nurse consulted for multiple wounds most significant located on the right lateral malleolus and left lower buttocks with appropriate skin care recommended. Noted ongoing bouts of confusion altered mental status felt to be secondary to acute encephalopathy due to sepsis that has steadily improved. Noted dysphagia questionable aspiration and nasogastric tube was placed for nutritional support. EGD. completed 04/25/2012 for further evaluation and diagnosis of dysphagia per Dr. Russella Dar that showed esophageal candidiasis and atrophic gastritis and placed on Diflucan x7 days. There was questionable possible gastrostomy feeding tube secondary to his dysphagia which appears to be long-standing and continued to be monitored.  Patient transferred to CIR on 04/26/2012 .   Patient currently requires mod with mobility secondary to muscle weakness and muscle joint tightness.  Prior to hospitalization, patient was supervision with mobility and lived with Spouse in a House home.  Home access is 2Stairs to enter.  Patient will benefit from skilled PT intervention to maximize safe functional mobility, minimize fall risk and decrease caregiver burden for planned discharge home with 24 hour assist.  Anticipate patient will benefit from follow up [facility] at discharge.  PT - End of Session Endurance Deficit: Yes Endurance Deficit Description: (wearing 3L O2 via [place], NPO with NGtube) gets shakey in standing and walking short distances and mild DOE   PT Assessment Rehab Potential: Good Barriers to Discharge: None PT  Plan PT Frequency: 1-2 X/day, 60-90 minutes Estimated Length of Stay: 2 weeks PT  Treatment/Interventions: Ambulation/gait training;Balance/vestibular training;DME/adaptive equipment instruction;Functional mobility training;Patient/family education;Splinting/orthotics;Stair training;Therapeutic Activities;Therapeutic Exercise;UE/LE Strength taining/ROM PT Recommendation Follow Up Recommendations: Home health PT  PT Evaluation Precautions/Restrictions Precautions Precautions: Fall Precaution Comments: decubitus ulcers sacrum and B Lower Legs Required Braces or Orthoses: Other Brace/Splint Other Brace/Splint: pressure relieving heel protectors when in bed Restrictions Weight Bearing Restrictions: No General @[id number] ([phone]::1) Vital Signs Therapy Vitals Temp: 98.4 F (36.9 C) Temp src: Oral Pulse Rate: 67  Resp: 18  BP: 116/61 mmHg Patient Position, if appropriate: Lying Oxygen Therapy SpO2: 100 % O2 Device: Nasal cannula O2 Flow Rate (L/min): 3 L/min Pain Pain Assessment Pain Assessment: No/denies pain Faces Pain Scale: No hurt Critical Care Pain Observation Tool (CPOT) Facial Expression: Relaxed, neutral Home Living/Prior Functioning Home Living Lives With: Spouse Available Help at Discharge: Family Type of Home: House Home Access: Stairs to enter Secretary/administrator of Steps: 2 Entrance Stairs-Rails: Right;Left;Can reach both Home Layout: One level Bathroom Shower/Tub: Engineer, manufacturing systems: Standard Bathroom Accessibility: Yes How Accessible: Accessible via walker Home Adaptive Equipment: Walker - rolling;Tub transfer bench;Bedside commode/3-in-1 Prior Function Level of Independence: Independent with basic ADLs;Independent with transfers;Requires assistive device for independence;Other (comment) (needed assistance with 2 steps entering home) Able to Take Stairs?: Yes (with min-A from wife) Driving: No Vocation: Retired Comments: dtr stays with patient every other weekend but otherwise is living in a family care  center Vision/Perception  Vision - History Baseline Vision: Wears glasses only for reading Patient Visual Report: No change from baseline  Cognition Overall Cognitive Status: Appears within functional limits for tasks assessed Orientation Level: Oriented to person;Oriented to place;Oriented to time;Oriented to situation Sensation Sensation Light Touch: Impaired by gross assessment (SILT impaired from ankles to toes Bilaterally) Coordination Heel Shin Test: impaired partly due to weakness R and L  Mobility Bed Mobility Bed Mobility: Rolling Right;Rolling Left;Right Sidelying to Sit;Sitting - Scoot to [place] of Bed;Sit to Sidelying Right;Scooting to [facility] Rolling Right: 3: Mod assist;With rail Rolling Right Details: Manual facilitation for weight shifting Rolling Left: 3: Mod assist;With rail Rolling Left Details: Manual facilitation for weight shifting Right Sidelying to Sit: 3: Mod assist;HOB flat Right Sidelying to Sit Details: Verbal cues for precautions/safety;Manual facilitation for weight shifting;Manual facilitation for placement Sitting - Scoot to Edge of Bed: 4: Min assist Sitting - Scoot to [place] of Bed Details: Verbal cues for precautions/safety;Manual facilitation for placement Sit to Sidelying Right: 3: Mod assist;HOB flat Sit to Sidelying Right Details: Verbal cues for precautions/safety;Manual facilitation for weight shifting Scooting to HOB: 2: Max assist Scooting to [facility] Details: Verbal cues for sequencing;Verbal cues for technique;Manual facilitation for weight shifting Transfers Sit to Stand: 3: Mod assist;With upper extremity assist;From chair/3-in-1 Sit to Stand Details: Tactile cues for posture;Tactile cues for placement;Verbal cues for precautions/safety;Manual facilitation for weight shifting Stand to Sit: 3: Mod assist;With upper extremity assist;To bed Stand to Sit Details (indicate cue type and reason): Tactile cues for weight shifting;Tactile cues for  placement;Verbal cues for precautions/safety;Manual facilitation for weight shifting Stand Pivot Transfers: 3: Mod assist Stand Pivot Transfer Details: Tactile cues for weight shifting;Tactile cues for placement;Verbal cues for sequencing;Verbal cues for precautions/safety;Manual facilitation for weight shifting Locomotion  Ambulation Ambulation: Yes Ambulation/Gait Assistance: 3: Mod assist Ambulation Distance (Feet): 5 Feet Assistive device: Rolling walker;1 person hand held assist Ambulation/Gait Assistance Details: Tactile cues for posture;Tactile cues for placement;Verbal cues for precautions/safety;Manual facilitation for weight shifting Gait Gait: Yes Gait  Pattern: Impaired Gait Pattern: Step-to pattern;Decreased step length - right;Decreased step length - left Gait velocity: slow Stairs / Additional Locomotion Stairs: No Curb: Not tested (comment) Wheelchair Mobility Wheelchair Mobility: No  Trunk/Postural Assessment  Lumbar Assessment Lumbar Assessment: Within Functional Limits Postural Control Postural Control: Within Functional Limits  Balance Balance Balance Assessed: Yes Static Sitting Balance Static Sitting - Balance Support: Feet supported;No upper extremity supported Static Sitting - Level of Assistance: 5: Stand by assistance Extremity Assessment      RLE Assessment: Exceptions to [facility] (AROM dorsiflexion R ankle 0 degrees due to tightness) LLE Assessment: Exceptions to [facility] (AROM R ankle dorsiflexion 0 degrees due to tightness)  See FIM for current functional status Refer to Care Plan for Long Term Goals  Recommendations for other services: None  Discharge Criteria: Patient will be discharged from PT if patient refuses treatment 3 consecutive times without medical reason, if treatment goals not met, if there is a change in medical status, if patient makes no progress towards goals or if patient is discharged from hospital.  The above assessment, treatment  plan, treatment alternatives and goals were discussed and mutually agreed upon: by patient  Rex Kras 04/27/2012, 3:30 PM

## 2012-04-27 NOTE — Progress Notes (Signed)
Subjective/Complaints: Didn't sleep well. Denies breathing or pain issues. Still with wet cough A 12 point review of systems has been performed and if not noted above is otherwise negative.   Objective: Vital Signs: Blood pressure 129/60, pulse 63, temperature 97.9 F (36.6 C), temperature source Oral, resp. rate 19, SpO2 100.00%. Dg Chest Port 1 View  04/26/2012  *RADIOLOGY REPORT*  Clinical Data: Cough.  Congestion.  Post EGD.  Diabetes.  PORTABLE CHEST - 1 VIEW  Comparison: 04/25/2012  Findings: Feeding tube extends beyond the  inferior aspect of the film.  Midline trachea.   Borderline cardiomegaly.     No pleural effusion or pneumothorax.  No significant change in bilateral perihilar interstitial and airspace disease.  IMPRESSION: No change in interstitial and airspace opacities bilaterally. Pulmonary edema and/or infection.   Original Report Authenticated By: Consuello Bossier, M.D.     Basename 04/26/12 0355  WBC 16.3*  HGB 7.8*  HCT 24.8*  PLT 444*    Basename 04/26/12 0355  NA 131*  K 4.8  CL 96  CO2 28  GLUCOSE 292*  BUN 32*  CREATININE 0.76  CALCIUM 7.7*   CBG (last 3)   Basename 04/27/12 0719 04/27/12 0409 04/27/12 0122  GLUCAP 126* 193* 279*    Wt Readings from Last 3 Encounters:  04/26/12 65 kg (143 lb 4.8 oz)  04/26/12 65 kg (143 lb 4.8 oz)  11/22/11 62.6 kg (138 lb 0.1 oz)    Physical Exam:  HENT:  Head: Normocephalic.  NGT in place. Missing many teeth. Mucosa pink but with white film over tongue and pharynx Eyes:  Pupils round and reactive to light  Neck: Neck supple. No thyromegaly present.  Cardiovascular: Regular rhythm. Normal rate Pulmonary/Chest: Breath sounds normal. No respiratory distress. He has no wheezes. Occasional upper airway sounds heard Abdominal: Bowel sounds are normal. He exhibits no distension.  Neurological: He is alert.  Patient is a poor historian. He was able to give his date of birth and age. He could name the hospital with  multiple cues. He would follow simple commands. Poor insight and awareness. He had difficulty controlling his secretions and a weak cough. Strength was symmetrical but he was weak more proximally than distally (2-3 prox to 4 distally). No focal sensory loss was discerned.  Skin:  Skin: 4cm lateral mallelolus wound with fibronecrotic tissue. Buttock areas with fibronecrotic tissue also (x 2) about 1-2cm in diameter Psychiatric:  Patient with limited awareness and insight   Assessment/Plan: 1. Functional deficits secondary to sepsis/deconditioning which require 3+ hours per day of interdisciplinary therapy in a comprehensive inpatient rehab setting. Physiatrist is providing close team supervision and 24 hour management of active medical problems listed below. Physiatrist and rehab team continue to assess barriers to discharge/monitor patient progress toward functional and medical goals. FIM:                                 Medical Problem List and Plan:  1. deconditioning related to sepsis multiple medical complications/metabolic encephalopathy/respiratory failure. Will discuss with critical care medicine on weaning of prednisone  2. DVT Prophylaxis/Anticoagulation: Subcutaneous heparin. Monitor platelet counts and any signs of bleeding  3. Pain Management: Lortab as needed. Monitor with increased mobility  4. Neuropsych: This patient is not capable of making decisions on his/her own behalf.  5. Dysphagia. Continue nasogastric tubes for now for nutritional support. Will make further evaluation for possible gastrostomy tube for  long-term nutrition   -f/u MBS per SLP 6.NSTEMI. Followup per cardiology services. Continue aspirin therapy as well as Lopressor 12.5 mg twice a day. No current chest pain or shortness of breath  7. Esophageal candidiasis. Diflucan x7 days. Sx improving 8. Diabetes mellitus with peripheral neuropathy. Hemoglobin A1c 10.5. Lantus insulin 15 units at  bedtime. Check blood sugars a.c. and at bedtime -follow for further pattern. Will likely need more titration 9. Right lateral malleolus on left lower buttocks decubitus ulcers. Santyl for right lateral malleolus breakdown 10. Anemia. Latest hemoglobin 7.8. Monitor for a bleeding episodes and followup CBC with plan to transfuse if symptomatic  11. Hypothyroidism. Synthroid  12. Medical noncompliance. Will discuss at length with patient and family the need to maintain medical regimen   LOS (Days) 1 A FACE TO FACE EVALUATION WAS PERFORMED  Shaliyah Taite T 04/27/2012, 8:56 AM

## 2012-04-27 NOTE — Plan of Care (Signed)
Overall Plan of Care [facility]) Patient Details Name: [name] MRN: [id number] DOB: 1929-04-15  Diagnosis:    Primary Diagnosis:    <principal problem not specified> Co-morbidities: dm, anemia, DPN, wounds  Functional Problem List  Patient demonstrates impairments in the following areas: Balance, Cognition, Endurance and Nutrition  Basic ADL's: eating, grooming, bathing, dressing and toileting Advanced ADL's: simple meal preparation  Transfers:  bed mobility, bed to chair and car Locomotion:  ambulation and stairs  Additional Impairments:  Swallowing and Social Cognition   memory  Anticipated Outcomes Item Anticipated Outcome  Eating/Swallowing  Min A with least restrictive diet  Basic self-care  supervision  Tolieting  supervision  Bowel/Bladder    Transfers  Supervision/Mod-Independent  Locomotion  S/Mod-I using RW x 50'  Min-Assist up/down 2 steps  Communication    Cognition  Supervision  Pain    Safety/Judgment  Supervision  Other     Therapy Plan: PT Frequency: 1-2 X/day, 60-90 minutes  OT Frequency:  1-2 x/day, 60-90 minutes SLP Frequency: 1-2 X/day, 30-60 minutes   Team Interventions: Item RN PT OT SLP SW TR Other  Self Care/Advanced ADL Retraining   x      Neuromuscular Re-Education   x      Therapeutic Activities  x x x     UE/LE Strength Training/ROM  x x      UE/LE Coordination Activities   x      Visual/Perceptual Remediation/Compensation         DME/Adaptive Equipment Instruction  x x      Therapeutic Exercise  x x x     Balance/Vestibular Training  x x      Patient/Family Education   x x     Cognitive Remediation/Compensation   x x     Functional Mobility Training  x x      Ambulation/Gait Training  x       Furniture conservator/restorer Reintegration   x      Dysphagia/Aspiration Scientist, water quality Facilitation           Bladder Management         Bowel Management         Disease Management/Prevention         Pain Management   x      Medication Management         Skin Care/Wound Management         Splinting/Orthotics         Discharge Planning   x x     Psychosocial Support   x x                        Team Discharge Planning: Destination:  Home Projected Follow-up:  PT, OT, SLP and Home Health Projected Equipment Needs:  None for OT Patient/family involved in discharge planning:  Yes  MD ELOS: 2 WEEKS Medical Rehab Prognosis:  Excellent Assessment: The patient has been admitted for CIR therapies with severe deconditioning and dysphagia. The team will be focusing on strength, stamina, balance, adaptive equipment, swallowing, skin care and nutrition. His goals are set at supervsion to minimal assist.

## 2012-04-27 NOTE — Plan of Care (Signed)
Problem: RH BOWEL ELIMINATION Goal: RH STG MANAGE BOWEL WITH ASSISTANCE STG Manage Bowel with max Assistance. Outcome: Not Progressing No bowel movement today  Problem: RH BLADDER ELIMINATION Goal: RH STG MANAGE BLADDER WITH ASSISTANCE STG Manage Bladder With max Assistance Outcome: Progressing Condom cath in Goal: RH STG MANAGE BLADDER WITH MEDICATION WITH ASSISTANCE STG Manage Bladder With Medication With max Assistance. Outcome: Progressing Condom cath on

## 2012-04-27 NOTE — Evaluation (Signed)
Occupational Therapy Assessment and Plan  Patient Details  Name: [name] MRN: [id number] Date of Birth: [birth year]  OT Diagnosis: muscle weakness (generalized) and progressive muscular atrophy Rehab Potential: Rehab Potential: Good ELOS:   2 weeks  Today's Date: 04/27/2012 Time: 0800-0900 Time Calculation (min): 60 min  Problem List:  Patient Active Problem List  Diagnosis  . Weakness  . Hypertension  . Diabetes mellitus  . Prostate cancer  . Hypothyroidism  . Hypokalemia  . Anemia  . Uncontrolled diabetes mellitus  . Cellulitis of lower leg  . Ulcer of leg due to secondary diabetes mellitus  . Diabetes with ulcer of foot  . Sacral decubitus ulcer  . Non-ST elevation myocardial infarction (NSTEMI), initial episode of care  . Pulmonary infiltrates  . Dysphagia  . Physical deconditioning  . Systolic dysfunction  . Hypernatremia  . Benign neoplasm of stomach    Past Medical History:  Past Medical History  Diagnosis Date  . Diabetes mellitus   . Cancer   . Thyroid disease   . Anemia   . Hyperlipidemia   . Neuropathy, diabetic   . Diabetic retinopathy    Past Surgical History:  Past Surgical History  Procedure Date  . Cataract extraction, bilateral   . Colonoscopy w/ polypectomy     Assessment & Plan Clinical Impression: Patient is a 78 y.o. year old male with recent admission to the hospital on Admitted 04/04/2012 with nausea, vomiting and altered mental status. Found to be hyperglycemic as well as acidotic. Cranial CT scan with chronic changes without acute abnormality. Patient was hypotensive secondary to dehydration with creatinine 1.41 and placed on intravenous fluids. Noted marginally elevated cardiac enzymes. Patient was intubated followed by critical care medicine. Followup cardiology services Dr. Patty Sermons with EKG showing normal sinus rhythm right bundle branch block and left anterior fascicular block consistent with type II NSTEMI felt to be secondary  to demand ischemia from his sepsis and hypotension. Echocardiogram with ejection fraction of 45%, diffuse hypokinesis and grade 1 diastolic dysfunction. Patient was slowly extubated. Maintained on subcutaneous heparin for DVT prophylaxis. Wound care nurse consulted for multiple wounds most significant located on the right lateral malleolus and left lower buttocks with appropriate skin care recommended. Noted ongoing bouts of confusion altered mental status felt to be secondary to acute encephalopathy due to sepsis that has steadily improved. Noted dysphagia questionable aspiration and nasogastric tube was placed for nutritional support.  Patient transferred to CIR on 04/26/2012 .    Patient currently requires max with basic self-care skills secondary to muscle weakness and decreased cardiorespiratoy endurance and decreased oxygen support.  Prior to hospitalization, patient could complete ADLS and IADLS with modified independence.  Patient will benefit from skilled intervention to decrease level of assist with basic self-care skills, increase independence with basic self-care skills and increase level of independence with iADL prior to discharge home with care partner.  Anticipate patient will require 24 hour supervision and follow up home health.  OT - End of Session Activity Tolerance: Tolerates < 10 min activity with changes in vital signs Endurance Deficit: Yes Endurance Deficit Description: Needs multiple rest breaks during B/D session.   OT Assessment Rehab Potential: Good Barriers to Discharge: Decreased caregiver support Barriers to Discharge Comments: Wife cannot provide physical assist OT Plan OT Frequency: 1-2 X/day, 60-90 minutes OT Treatment/Interventions: Balance/vestibular training;Community reintegration;DME/adaptive equipment instruction;Discharge planning;Functional mobility training;Neuromuscular re-education;Self Care/advanced ADL retraining;Therapeutic Exercise;Therapeutic  Activities;Patient/family education;Psychosocial support;Pain management;UE/LE Strength taining/ROM;UE/LE Coordination activities OT Recommendation Follow Up Recommendations: Home health  OT Equipment Recommended: None recommended by OT Equipment Details: has 3:1, walker, tub seat  OT Evaluation Precautions/Restrictions  Precautions Precautions: Fall Precaution Comments: NG tube, multiple decubitus lcers to sacrum and lower legs Required Braces or Orthoses: Other Brace/Splint Other Brace/Splint: pressure relieving heel protectors when in bed Restrictions Weight Bearing Restrictions: No  Vital Signs Therapy Vitals Temp: 98.4 F (36.9 C) Temp src: Oral Pulse Rate: 67  Resp: 18  BP: 116/61 mmHg Patient Position, if appropriate: Lying Oxygen Therapy SpO2: 100 % O2 Device: Nasal cannula O2 Flow Rate (L/min): 3 L/min Pain Pain Assessment Pain Assessment: No/denies pain Faces Pain Scale: No hurt Critical Care Pain Observation Tool (CPOT) Facial Expression: Relaxed, neutral Home Living/Prior Functioning Home Living Lives With: Spouse Available Help at Discharge: Family Type of Home: House Home Access: Stairs to enter Secretary/administrator of Steps: 2 Entrance Stairs-Rails: Right;Left;Can reach both Home Layout: One level Bathroom Shower/Tub: Engineer, manufacturing systems: Standard Bathroom Accessibility: Yes How Accessible: Accessible via walker Home Adaptive Equipment: Walker - rolling;Tub transfer bench;Bedside commode/3-in-1 Prior Function Level of Independence: Independent with basic ADLs;Independent with transfers;Requires assistive device for independence;Other (comment) (needed assistance with 2 steps entering home) Able to Take Stairs?: Yes (with min-A from wife) Driving: No Vocation: Retired Comments: dtr stays with patient every other weekend but otherwise is living in a family care center ADL ADL Eating: NPO Grooming: Supervision/safety Where  Assessed-Grooming: Edge of bed Upper Body Bathing: Supervision/safety Where Assessed-Upper Body Bathing: Edge of bed Lower Body Bathing: Moderate assistance Where Assessed-Lower Body Bathing: Edge of bed Upper Body Dressing: Minimal assistance Where Assessed-Upper Body Dressing: Edge of bed Lower Body Dressing: Dependent Where Assessed-Lower Body Dressing: Edge of bed Toileting: Dependent Where Assessed-Toileting: Teacher, adult education: Moderate assistance Toilet Transfer Method: Ambulating (with use of RW) Tub/Shower Transfer: Not assessed Film/video editor: Not assessed ADL Comments: Pt with limited endurance and decreased flexibility to reach his feet for dressing tasks.  Mod assist needed for sit to stand with mod instructional cueing for hand placement during sit to stand. Vision/Perception  Vision - History Baseline Vision: Wears glasses only for reading Patient Visual Report: No change from baseline Vision - Assessment Vision Assessment: Vision not tested Perception Perception: Within Functional Limits Praxis Praxis: Intact  Cognition Overall Cognitive Status: Appears within functional limits for tasks assessed Orientation Level: Disoriented to place;Oriented to person;Oriented to time;Oriented to situation Attention: Selective Sensation Sensation Light Touch: Impaired Detail Light Touch Impaired Details: Impaired LUE (decreased light touch acuity in the left hand.) Stereognosis: Not tested Hot/Cold: Not tested Proprioception: Not tested Coordination Gross Motor Movements are Fluid and Coordinated: Yes Fine Motor Movements are Fluid and Coordinated: No (Pt reports difficulty with buttons.) Heel Shin Test: impaired partly due to weakness R and L Motor  Motor Motor: Within Functional Limits Mobility  Bed Mobility Bed Mobility: Supine to Sit;Sit to Supine Rolling Right: 3: Mod assist;With rail Rolling Right Details: Manual facilitation for weight  shifting Rolling Left: 3: Mod assist;With rail Rolling Left Details: Manual facilitation for weight shifting Right Sidelying to Sit: 3: Mod assist;HOB flat Right Sidelying to Sit Details: Verbal cues for precautions/safety;Manual facilitation for weight shifting;Manual facilitation for placement Supine to Sit: 3: Mod assist;HOB flat Supine to Sit Details: Manual facilitation for weight shifting Sitting - Scoot to Edge of Bed: 4: Min assist Sitting - Scoot to [place] of Bed Details: Verbal cues for precautions/safety;Manual facilitation for placement Sit to Supine: 3: Mod assist;HOB flat Sit to Supine - Details: Manual facilitation for  weight bearing Sit to Supine - Details (indicate cue type and reason): Pt required assist with lifting LEs into the bed. Sit to Sidelying Right: 3: Mod assist;HOB flat Sit to Sidelying Right Details: Verbal cues for precautions/safety;Manual facilitation for weight shifting Scooting to HOB: 2: Max assist Scooting to [facility] Details: Verbal cues for sequencing;Verbal cues for technique;Manual facilitation for weight shifting Transfers Sit to Stand: 3: Mod assist;With upper extremity assist;From chair/3-in-1 Sit to Stand Details: Tactile cues for posture;Tactile cues for placement;Verbal cues for precautions/safety;Manual facilitation for weight shifting Stand to Sit: 3: Mod assist;With upper extremity assist;To bed Stand to Sit Details (indicate cue type and reason): Tactile cues for weight shifting;Tactile cues for placement;Verbal cues for precautions/safety;Manual facilitation for weight shifting  Trunk/Postural Assessment  Cervical Assessment Cervical Assessment: Within Functional Limits Thoracic Assessment Thoracic Assessment: Within Functional Limits Lumbar Assessment Lumbar Assessment: Within Functional Limits Postural Control Postural Control: Deficits on evaluation Trunk Control: Pt with forward trunk flexion in standing and with ambulation to the  bathroom.  Balance Balance Balance Assessed: Yes Static Sitting Balance Static Sitting - Balance Support: Right upper extremity supported;Left upper extremity supported Static Sitting - Level of Assistance: 5: Stand by assistance Dynamic Sitting Balance Dynamic Sitting - Balance Support: No upper extremity supported Dynamic Sitting - Level of Assistance: 4: Min assist Static Standing Balance Static Standing - Balance Support: Right upper extremity supported;Left upper extremity supported Static Standing - Level of Assistance: 3: Mod assist Extremity/Trunk Assessment RUE Assessment RUE Assessment: Exceptions to [facility] RUE Strength RUE Overall Strength: Deficits RUE Overall Strength Comments: Shoulder flexion strength 3+/5, elbow flexion/extension 4/5, grip 4/5 LUE Assessment LUE Assessment: Exceptions to [facility] LUE Strength LUE Overall Strength: Deficits LUE Overall Strength Comments: Shoulder flexion 3+/5, elbow flexion/extension and grip 4/5.  Noted clawing posture in the hand with hyperextension at the MP joints.  See FIM for current functional status Refer to Care Plan for Long Term Goals  Recommendations for other services: None  Discharge Criteria: Patient will be discharged from OT if patient refuses treatment 3 consecutive times without medical reason, if treatment goals not met, if there is a change in medical status, if patient makes no progress towards goals or if patient is discharged from hospital.  The above assessment, treatment plan, treatment alternatives and goals were discussed and mutually agreed upon: by patient  During session began education on selfcare retraining, working on endurance and safety with use of RW for sit to stand.  Pt performed bathing on EOB with close supervision to min assist for sitting balance.  Decreased ability to reach his feet for dressing tasks.  May benefit from AE for greater independence and energy conservation.  Placed back in bed to rest  at end of session secondary to fatigue level.  Matsue Strom OTR/L 04/27/2012, 4:21 PM

## 2012-04-28 ENCOUNTER — Inpatient Hospital Stay (HOSPITAL_COMMUNITY): Payer: Medicare Other | Admitting: *Deleted

## 2012-04-28 LAB — GLUCOSE, CAPILLARY
Glucose-Capillary: 98 mg/dL (ref 70–99)
Glucose-Capillary: 141 mg/dL — ABNORMAL HIGH (ref 70–99)
Glucose-Capillary: 293 mg/dL — ABNORMAL HIGH (ref 70–99)

## 2012-04-28 MED ORDER — INSULIN GLARGINE 100 UNIT/ML ~~LOC~~ SOLN
10.0000 [IU] | Freq: Every day | SUBCUTANEOUS | Status: DC
  Administered 2012-04-29 – 2012-04-30 (×2): 10 [IU] via SUBCUTANEOUS

## 2012-04-28 MED ORDER — METOPROLOL TARTRATE 25 MG/10 ML ORAL SUSPENSION
12.5000 mg | Freq: Two times a day (BID) | ORAL | Status: DC
  Administered 2012-04-28 – 2012-05-05 (×12): 12.5 mg
  Filled 2012-04-28 (×21): qty 5

## 2012-04-28 NOTE — Progress Notes (Signed)
Subjective/Complaints: Slept a little better. Denies pain this am. A 12 point review of systems has been performed and if not noted above is otherwise negative.   Objective: Vital Signs: Blood pressure 111/49, pulse 67, temperature 98.4 F (36.9 C), temperature source Oral, resp. rate 17, SpO2 100.00%. No results found.  Basename 04/26/12 0355  WBC 16.3*  HGB 7.8*  HCT 24.8*  PLT 444*    Basename 04/26/12 0355  NA 131*  K 4.8  CL 96  CO2 28  GLUCOSE 292*  BUN 32*  CREATININE 0.76  CALCIUM 7.7*   CBG (last 3)   Basename 04/28/12 0428 04/28/12 0135 04/27/12 2039  GLUCAP 176* 294* 232*    Wt Readings from Last 3 Encounters:  04/26/12 65 kg (143 lb 4.8 oz)  04/26/12 65 kg (143 lb 4.8 oz)  11/22/11 62.6 kg (138 lb 0.1 oz)    Physical Exam:  HENT:  Head: Normocephalic.  NGT in place. Missing many teeth. Mucosa pink but with white film over tongue and pharynx Eyes:  Pupils round and reactive to light  Neck: Neck supple. No thyromegaly present.  Cardiovascular: Regular rhythm. Normal rate Pulmonary/Chest: Breath sounds normal. No respiratory distress. He has no wheezes. Occasional upper airway sounds heard Abdominal: Bowel sounds are normal. He exhibits no distension.  Neurological: He is alert.  Patient is a poor historian. He was able to give his date of birth and age. He could name the hospital with multiple cues. He would follow simple commands. Poor insight and awareness. He had difficulty controlling his secretions and a weak cough. Strength was symmetrical but he was weak more proximally than distally (2-3 prox to 4 distally). No focal sensory loss was discerned.  Skin:  Skin: 4cm lateral mallelolus wound with fibronecrotic tissue. Buttock areas with fibronecrotic tissue also (x 2) about 1-2cm in diameter Psychiatric:  Patient with limited awareness and insight   Assessment/Plan: 1. Functional deficits secondary to sepsis/deconditioning which require 3+ hours  per day of interdisciplinary therapy in a comprehensive inpatient rehab setting. Physiatrist is providing close team supervision and 24 hour management of active medical problems listed below. Physiatrist and rehab team continue to assess barriers to discharge/monitor patient progress toward functional and medical goals. FIM: FIM - Bathing Bathing Steps Patient Completed: Right Arm;Left Arm;Chest;Abdomen;Right upper leg;Left upper leg Bathing: 3: Mod-Patient completes 5-7 15f 10 parts or 50-74%  FIM - Upper Body Dressing/Undressing Upper body dressing/undressing steps patient completed: Thread/unthread right sleeve of pullover shirt/dresss;Thread/unthread left sleeve of pullover shirt/dress;Put head through opening of pull over shirt/dress Upper body dressing/undressing: 4: Min-Patient completed 75 plus % of tasks FIM - Lower Body Dressing/Undressing Lower body dressing/undressing: 1: Total-Patient completed less than 25% of tasks  FIM - Toileting Toileting: 0: Activity did not occur  FIM - Diplomatic Services operational officer Devices: Grab bars;Elevated toilet seat Toilet Transfers: 3-To toilet/BSC: Mod A (lift or lower assist);3-From toilet/BSC: Mod A (lift or lower assist)  FIM - Bed/Chair Transfer Bed/Chair Transfer Assistive Devices: Manufacturing systems engineer Transfer: 3: Supine > Sit: Mod A (lifting assist/Pt. 50-74%/lift 2 legs;3: Bed > Chair or W/C: Mod A (lift or lower assist);3: Chair or W/C > Bed: Mod A (lift or lower assist)  FIM - Locomotion: Wheelchair Locomotion: Wheelchair: 0: Activity did not occur FIM - Locomotion: Ambulation Locomotion: Ambulation Assistive Devices: Designer, industrial/product Ambulation/Gait Assistance: 3: Mod assist  Comprehension Comprehension Mode: Auditory Comprehension: 5-Follows basic conversation/direction: With no assist  Expression Expression Mode: Verbal Expression: 5-Expresses basic needs/ideas: With  extra time/assistive device  Social  Interaction Social Interaction: 5-Interacts appropriately 90% of the time - Needs monitoring or encouragement for participation or interaction.  Problem Solving Problem Solving: 4-Solves basic 75 - 89% of the time/requires cueing 10 - 24% of the time  Memory Memory: 4-Recognizes or recalls 75 - 89% of the time/requires cueing 10 - 24% of the time Medical Problem List and Plan:  1. deconditioning related to sepsis multiple medical complications/metabolic encephalopathy/respiratory failure. Will discuss with critical care medicine on weaning of prednisone  2. DVT Prophylaxis/Anticoagulation: Subcutaneous heparin. Monitor platelet counts and any signs of bleeding  3. Pain Management: Lortab as needed. Monitor with increased mobility  4. Neuropsych: This patient is not capable of making decisions on his/her own behalf.  5. Dysphagia. Continue nasogastric tubes for now for nutritional support. Will make further evaluation for possible gastrostomy tube for long-term nutrition   -f/u MBS per SLP 6.NSTEMI. Followup per cardiology services. Continue aspirin therapy as well as Lopressor 12.5 mg twice a day (hold today). No current chest pain or shortness of breath  7. Esophageal candidiasis. Diflucan x7 days. Sx improving 8. Diabetes mellitus with peripheral neuropathy. Hemoglobin A1c 10.5. Lantus insulin 15 units at bedtime. Will ad am lantus as well to better cover pm cbgs.  Check blood sugars a.c. and at bedtime  9. Right lateral malleolus on left lower buttocks decubitus ulcers. Santyl for right lateral malleolus breakdown 10. Anemia. Latest hemoglobin 7.8. Monitor for a bleeding episodes and followup CBC with plan to transfuse if symptomatic  11. Hypothyroidism. Synthroid  12. Medical noncompliance. Will discuss at length with patient and family the need to maintain medical regimen   LOS (Days) 2 A FACE TO FACE EVALUATION WAS PERFORMED  SWARTZ,ZACHARY T 04/28/2012, 8:39 AM

## 2012-04-28 NOTE — Progress Notes (Signed)
Physical Therapy Note  Patient Details  Name: [name] MRN: [id number] Date of Birth: 1929-04-22 Today's Date: 04/28/2012  Time:1300-1345  (45 min) Pain:  none   Individual Session  Pt. Lying in bed asleep upon OT arrival.  Performed bed mobility (supervision to for supine to sit).  Sat  EOB to perform therapeutic activities, therapeutic exercises, sit to stand, and functional mobility. Ambulated with RW to bathroom and back to bed.  Pt on 3 liters of oxygen.  No SOB.  Attempted to repeat the procedure but pt fatigued.   Pt. Stated daughter was bringing electric shaver to shave.  Needed mod assist to go from sitting to supine.   Humberto Seals 04/28/2012, 1:08 PM

## 2012-04-29 ENCOUNTER — Inpatient Hospital Stay (HOSPITAL_COMMUNITY): Payer: Medicare Other

## 2012-04-29 ENCOUNTER — Encounter (HOSPITAL_COMMUNITY): Payer: Self-pay | Admitting: Gastroenterology

## 2012-04-29 ENCOUNTER — Inpatient Hospital Stay (HOSPITAL_COMMUNITY): Payer: Medicare Other | Admitting: Physical Therapy

## 2012-04-29 ENCOUNTER — Inpatient Hospital Stay (HOSPITAL_COMMUNITY): Payer: Medicare Other | Admitting: Speech Pathology

## 2012-04-29 DIAGNOSIS — R1310 Dysphagia, unspecified: Secondary | ICD-10-CM

## 2012-04-29 DIAGNOSIS — Z5189 Encounter for other specified aftercare: Secondary | ICD-10-CM

## 2012-04-29 DIAGNOSIS — A4189 Other specified sepsis: Secondary | ICD-10-CM

## 2012-04-29 DIAGNOSIS — R5381 Other malaise: Secondary | ICD-10-CM

## 2012-04-29 DIAGNOSIS — A419 Sepsis, unspecified organism: Secondary | ICD-10-CM

## 2012-04-29 DIAGNOSIS — B3781 Candidal esophagitis: Secondary | ICD-10-CM

## 2012-04-29 LAB — COMPREHENSIVE METABOLIC PANEL
ALT: 18 U/L (ref 0–53)
AST: 23 U/L (ref 0–37)
CO2: 32 mEq/L (ref 19–32)
Calcium: 7.9 mg/dL — ABNORMAL LOW (ref 8.4–10.5)
Creatinine, Ser: 0.68 mg/dL (ref 0.50–1.35)
GFR calc non Af Amer: 87 mL/min — ABNORMAL LOW (ref >90)
Sodium: 131 mEq/L — ABNORMAL LOW (ref 135–145)
Total Protein: 5.7 g/dL — ABNORMAL LOW (ref 6.0–8.3)

## 2012-04-29 LAB — CBC WITH DIFFERENTIAL/PLATELET
Basophils Absolute: 0 10*3/uL (ref 0.0–0.1)
Basophils Relative: 0 % (ref 0–1)
Eosinophils Absolute: 0.5 10*3/uL (ref 0.0–0.7)
Eosinophils Relative: 2 % (ref 0–5)
HCT: 24.1 % — ABNORMAL LOW (ref 39.0–52.0)
Lymphocytes Relative: 7 % — ABNORMAL LOW (ref 12–46)
MCH: 31.5 pg (ref 26.0–34.0)
MCHC: 31.1 g/dL (ref 30.0–36.0)
MCV: 101.3 fL — ABNORMAL HIGH (ref 78.0–100.0)
Monocytes Absolute: 1.7 10*3/uL — ABNORMAL HIGH (ref 0.1–1.0)
Platelets: 518 10*3/uL — ABNORMAL HIGH (ref 150–400)
RDW: 15.4 % (ref 11.5–15.5)
WBC: 22.6 10*3/uL — ABNORMAL HIGH (ref 4.0–10.5)

## 2012-04-29 LAB — GLUCOSE, CAPILLARY
Glucose-Capillary: 163 mg/dL — ABNORMAL HIGH (ref 70–99)
Glucose-Capillary: 165 mg/dL — ABNORMAL HIGH (ref 70–99)
Glucose-Capillary: 217 mg/dL — ABNORMAL HIGH (ref 70–99)
Glucose-Capillary: 265 mg/dL — ABNORMAL HIGH (ref 70–99)

## 2012-04-29 MED ORDER — PRO-STAT SUGAR FREE PO LIQD
30.0000 mL | Freq: Every day | ORAL | Status: DC
  Administered 2012-04-29 – 2012-05-05 (×6): 30 mL
  Filled 2012-04-29 (×11): qty 30

## 2012-04-29 MED ORDER — GLUCERNA 1.2 CAL PO LIQD
1000.0000 mL | ORAL | Status: DC
  Administered 2012-04-29 – 2012-05-05 (×4): 1000 mL
  Filled 2012-04-29 (×18): qty 1000

## 2012-04-29 NOTE — Progress Notes (Signed)
Physical Therapy Session Note  Patient Details  Name: [name] MRN: [id number] Date of Birth: 07/22/1929  Today's Date: 04/29/2012 Time:  -   0850- -0950 (60 minutes) individual Pain: no complaint of pain  Short Term Goals: Week 1:  PT Short Term Goal 1 (Week 1): Patient will be able to perform Bed mobility with Min-Assist PT Short Term Goal 2 (Week 1): Patient will be able to perform Transfers with Min-Assist PT Short Term Goal 3 (Week 1): Patient will be able to perform gait using RW x 30' with Min-Assist PT Short Term Goal 4 (Week 1): Patient will be able to ascend/descend 2 steps using B handrails with Min-Assist  Skilled Therapeutic Interventions/Progress Updates: Bed mobility training; transfer training; bilateral LE AROM/strengthening     Therapy Documentation Precautions:  Precautions Precautions: Fall Precaution Comments: NG tube, multiple decubitus lcers to sacrum and lower legs Required Braces or Orthoses: Other Brace/Splint Other Brace/Splint: pressure relieving heel protectors when in bed Restrictions Weight Bearing Restrictions: No General: Pt in bed; cooperative   Vital Signs: Therapy Vitals Temp: 98.1 F (36.7 C) Temp src: Oral Pulse Rate: 70  Resp: 18  BP: 125/67 mmHg Patient Position, if appropriate: Lying Oxygen Therapy SpO2: 100 % O2 Device: Nasal cannula O2 Flow Rate (L/min): 3 L/min    Mobility: supine to sit min assist (trunk) using bedrail for assist; transfer - squat/pivot with bilateral UE support min assist; wc setup- vcs for placement ,brakes; stand/pivot transfer wc><mat min assist sit to stand from wc; SPT min assist for safety; sit to supine (mat) min assist with difficulty bilateral LEs ; sit to stand from mat 2 X 3 min assist; sit to stand from wc min/mod assist with bilateral knee instability              Exercises: Bilateral LE AROM/strengthening- X 15 heel slides, ankle pumps, hip abduction , AA SLRs with quad lag; sit to  stand; sit to stand 2 X 3 from mat min assist with bilateral knee extension instaability   Other Treatments:  Nustep for general strengthening/activity tolerance x 5 minutes LEVEL 2 (Oxygen sats post 5 minutes = 94%)  1600 -1625 ( 25 minutes) individual Pain: no complaint of pain Focus of treatment: Bilateral LE strengthening in standing Treatment: supine to sit with bedrail SBA with increased time; sit to stand using bedrail for support 2 X 5 for quad strengthening; standing marching in place using RW  2 X 10 ; sit to supine mod assist bilateral LEs.  See FIM for current functional status  Therapy/Group: Individual Therapy  Torin Modica,JIM 04/29/2012, 8:09 AM

## 2012-04-29 NOTE — Progress Notes (Signed)
Subjective/Complaints: Feels good. Denies pain. A 12 point review of systems has been performed and if not noted above is otherwise negative.   Objective: Vital Signs: Blood pressure 125/67, pulse 70, temperature 98.1 F (36.7 C), temperature source Oral, resp. rate 18, SpO2 100.00%. No results found.  Basename 04/29/12 0632  WBC 22.6*  HGB 7.5*  HCT 24.1*  PLT 518*   No results found for this basename: NA:2,K:2,CL:2,CO2:2,GLUCOSE:2,BUN:2,CREATININE:2,CALCIUM:2 in the last 72 hours CBG (last 3)   Basename 04/29/12 0712 04/29/12 0413 04/29/12 0021  GLUCAP 178* 163* 217*    Wt Readings from Last 3 Encounters:  04/26/12 65 kg (143 lb 4.8 oz)  04/26/12 65 kg (143 lb 4.8 oz)  11/22/11 62.6 kg (138 lb 0.1 oz)    Physical Exam:  HENT:  Head: Normocephalic.  NGT in place. Missing many teeth. Mucosa pink but with white film over tongue and pharynx. Voice better Eyes:  Pupils round and reactive to light  Neck: Neck supple. No thyromegaly present.  Cardiovascular: Regular rhythm. Normal rate Pulmonary/Chest: Breath sounds normal. No respiratory distress. He has no wheezes. Occasional upper airway sounds heard Abdominal: Bowel sounds are normal. He exhibits no distension.  Neurological: He is alert.  Patient is a poor historian. He was able to give his date of birth and age. He could name the hospital with multiple cues. He would follow simple commands. Poor insight and awareness. He had difficulty controlling his secretions and a weak cough. Strength was symmetrical but he was weak more proximally than distally (2-3 prox to 4 distally). No focal sensory loss was discerned.  Skin:  Skin: 4cm lateral mallelolus wound with fibronecrotic tissue. Buttock areas with fibronecrotic tissue also (x 2) about 1-2cm in diameter Psychiatric:  Patient with limited awareness and insight   Assessment/Plan: 1. Functional deficits secondary to sepsis/deconditioning which require 3+ hours per day  of interdisciplinary therapy in a comprehensive inpatient rehab setting. Physiatrist is providing close team supervision and 24 hour management of active medical problems listed below. Physiatrist and rehab team continue to assess barriers to discharge/monitor patient progress toward functional and medical goals. FIM: FIM - Bathing Bathing Steps Patient Completed: Right Arm;Left Arm;Chest;Abdomen;Right upper leg;Left upper leg Bathing: 3: Mod-Patient completes 5-7 99f 10 parts or 50-74%  FIM - Upper Body Dressing/Undressing Upper body dressing/undressing steps patient completed: Thread/unthread right sleeve of pullover shirt/dresss;Thread/unthread left sleeve of pullover shirt/dress;Put head through opening of pull over shirt/dress Upper body dressing/undressing: 4: Min-Patient completed 75 plus % of tasks FIM - Lower Body Dressing/Undressing Lower body dressing/undressing: 1: Total-Patient completed less than 25% of tasks  FIM - Toileting Toileting: 0: Activity did not occur  FIM - Diplomatic Services operational officer Devices: Grab bars;Elevated toilet seat Toilet Transfers: 0-Activity did not occur  FIM - Banker Devices: Manufacturing systems engineer Transfer: 3: Supine > Sit: Mod A (lifting assist/Pt. 50-74%/lift 2 legs;3: Bed > Chair or W/C: Mod A (lift or lower assist);3: Chair or W/C > Bed: Mod A (lift or lower assist)  FIM - Locomotion: Wheelchair Locomotion: Wheelchair: 0: Activity did not occur FIM - Locomotion: Ambulation Locomotion: Ambulation Assistive Devices: Designer, industrial/product Ambulation/Gait Assistance: 3: Mod assist  Comprehension Comprehension Mode: Auditory Comprehension: 5-Follows basic conversation/direction: With no assist  Expression Expression Mode: Verbal Expression: 5-Expresses basic needs/ideas: With extra time/assistive device  Social Interaction Social Interaction: 5-Interacts appropriately 90% of the time - Needs  monitoring or encouragement for participation or interaction.  Problem Solving Problem Solving: 4-Solves basic  75 - 89% of the time/requires cueing 10 - 24% of the time  Memory Memory: 4-Recognizes or recalls 75 - 89% of the time/requires cueing 10 - 24% of the time Medical Problem List and Plan:  1. deconditioning related to sepsis multiple medical complications/metabolic encephalopathy/respiratory failure. Will discuss with critical care medicine on weaning of prednisone  2. DVT Prophylaxis/Anticoagulation: Subcutaneous heparin. Monitor platelet counts and any signs of bleeding  3. Pain Management: Lortab as needed. Monitor with increased mobility  4. Neuropsych: This patient is not capable of making decisions on his/her own behalf.  5. Dysphagia. Continue nasogastric tubes for now for nutritional support. Will make further evaluation for possible gastrostomy tube for long-term nutrition   -f/u MBS per SLP  -phonation is better and throat is less painful 6.NSTEMI. Followup per cardiology services. Continue aspirin therapy as well as Lopressor 12.5 mg twice a day (hold today). No current chest pain or shortness of breath  7. Esophageal candidiasis. Diflucan x7 days. Sx improving 8. Diabetes mellitus with peripheral neuropathy. Hemoglobin A1c 10.5. Lantus insulin 15 units at bedtime. added am lantus as well to better cover pm cbgs.  Check blood sugars a.c. and at bedtime   -continue to titrate as needed. 9. Right lateral malleolus on left lower buttocks decubitus ulcers. Santyl for right lateral malleolus breakdown 10. Anemia. Latest hemoglobin 7.8. Monitor for a bleeding episodes and followup CBC with plan to transfuse if symptomatic  11. Hypothyroidism. Synthroid  12. Medical noncompliance. Will discuss at length with patient and family the need to maintain medical regimen   LOS (Days) 3 A FACE TO FACE EVALUATION WAS PERFORMED  [name] T 04/29/2012, 8:08 AM

## 2012-04-29 NOTE — Care Management Note (Signed)
[facility] Individual Statement of Services  Patient Name:  [name]  Date:  04/29/2012  Welcome to the [facility].  Our goal is to provide you with an individualized program based on your diagnosis and situation, designed to meet your specific needs.  With this comprehensive rehabilitation program, you will be expected to participate in at least 3 hours of rehabilitation therapies Monday-Friday, with modified therapy programming on the weekends.  Your rehabilitation program will include the following services:  Physical Therapy (PT), Occupational Therapy (OT), Speech Therapy (ST), 24 hour per day rehabilitation nursing, Therapeutic Recreaction (TR), Neuropsychology, Case Management (RN and Social Worker), Rehabilitation Medicine, Nutrition Services and Pharmacy Services  Weekly team conferences will be held on Wednesday to discuss your progress.  Your RN Case Designer, television/film set will talk with you frequently to get your input and to update you on team discussions.  Team conferences with you and your family in attendance may also be held.  Expected length of stay: 2 weeks Overall anticipated outcome: Supervision/mod/i level  Depending on your progress and recovery, your program may change.  Your RN Case Estate agent will coordinate services and will keep you informed of any changes.  Your RN Sports coach and SW names and contact numbers are listed  below.  The following services may also be recommended but are not provided by the [facility]:   Driving Evaluations  Home Health Rehabiltiation Services  Outpatient Rehabilitatation [facility]  Vocational Rehabilitation   Arrangements will be made to provide these services after discharge if needed.  Arrangements include referral to agencies that provide these services.  Your insurance has been verified to be: [employer] Your primary doctor is:  Dr.  [name]  Pertinent information will be shared with your doctor and your insurance company.   Social Worker:  Dossie Der, [place] [phone]  Information discussed with and copy given to patient by: Lucy Chris, 04/29/2012, 9:39 AM

## 2012-04-29 NOTE — Progress Notes (Signed)
Speech Language Pathology Daily Session Note  Patient Details  Name: [name] MRN: [id number] Date of Birth: 1929-02-26  Today's Date: 04/29/2012 Time: 1345-1430 Time Calculation (min): 45 min  Short Term Goals: Week 1: SLP Short Term Goal 1 (Week 1): Pt will consume trials of thin liquids via spoon without overt s/s of aspiration with 75% of trials SLP Short Term Goal 2 (Week 1): Pt will consume trials of puree textures without overt s/s of aspiration with 75% of trials. SLP Short Term Goal 3 (Week 1): Pt will recall new, daily information with supervision verbal and visual cues.   Skilled Therapeutic Interventions: Treatment focus on trails of ice chips and thin liquids to assess readiness for MBSS. Pt with increased vocal intensity and management of secretions today. Pt consumed trials of ice chips and thin liquids via spoon without overt s/s of aspiration and per palpation, pt's laryngeal strength and excursion appear stronger. Recommend pt have MBSS to assess dysphagia and possibility of PO intake.    FIM:  Comprehension Comprehension Mode: Auditory Comprehension: 5-Follows basic conversation/direction: With extra time/assistive device Expression Expression: 5-Expresses basic needs/ideas: With extra time/assistive device Social Interaction Social Interaction: 5-Interacts appropriately 90% of the time - Needs monitoring or encouragement for participation or interaction. Problem Solving Problem Solving: 4-Solves basic 75 - 89% of the time/requires cueing 10 - 24% of the time Memory Memory: 4-Recognizes or recalls 75 - 89% of the time/requires cueing 10 - 24% of the time  Pain Pain Assessment Pain Assessment: No/denies pain  Therapy/Group: Individual Therapy  Nely Dedmon 04/29/2012, 2:46 PM

## 2012-04-29 NOTE — Progress Notes (Signed)
Occupational Therapy Session Note  Patient Details  Name: [name] MRN: [id number] Date of Birth: 02-11-1929  Today's Date: 04/29/2012 Time: 1100-1155 Time Calculation (min): 55 min  Short Term Goals: Week 1:  OT Short Term Goal 1 (Week 1): Pt will tolerate 3 mins standing during grooming activities before needing to rest. OT Short Term Goal 2 (Week 1): Pt will perform LB bathing and dressing with min assist sit to stand with AE PRN. OT Short Term Goal 3 (Week 1): Pt will perform toilet transfers with min assist using RW to 3:1 OT Short Term Goal 4 (Week 1): Pt will perform BUE light therex with suprevision to increase overall endurance for basic selfcare tasks and functional transfers.  Skilled Therapeutic Interventions/Progress Updates:    Pt seated in w/c with O2, IV and NG tube in place.  Pt agreeable to washing and changing clothes but patient had no clothes and decided to "wash up" and pull clothes back on.  Pt required extra time to complete all tasks and multiple rest breaks.  O2 sats >90% on 3L O2.  Pt stood with RW to bathe perineal area, requiring min A while standing.  Pt required mod A for sit<>stand from w/c.  Focus on activity tolerance, standing balance, sequencing, and initiation.  Therapy Documentation Precautions:  Precautions Precautions: Fall Precaution Comments: NG tube, multiple decubitus lcers to sacrum and lower legs Required Braces or Orthoses: Other Brace/Splint Other Brace/Splint: pressure relieving heel protectors when in bed Restrictions Weight Bearing Restrictions: No   Pain: Pain Assessment Pain Assessment: No/denies pain Pain Score: 0-No pain    See FIM for current functional status  Therapy/Group: Individual Therapy  [name] 04/29/2012, 12:01 PM

## 2012-04-29 NOTE — Progress Notes (Signed)
INITIAL ADULT NUTRITION ASSESSMENT Date: 04/29/2012   Time: 11:47 AM  Reason for Assessment: Enteral Nutrition Management   INTERVENTION: 1. To better meet nutrition needs will change enteral nutrition formula to fiber-containing and carbohydrate-controlled formula: Glucerna 1.2. Recommend initiation of Glucerna 1.2 at 25 ml/hr, advance by 10 ml/hr every 4 hours (or as tolerated) to goal of 75 ml/hr x 20 hours (anticipate TF being held for 4 hours daily for therapies.) 30 ml Prostat via tube daily. This regimen will provide: 1900 kcal, 105 grams protein, 1208 ml free water. 2. RD to continue to follow nutrition care plan   ASSESSMENT: Male 78 y.o.  Dx: Sepsis  Hx:  Past Medical History  Diagnosis Date  . Diabetes mellitus   . Cancer   . Thyroid disease   . Anemia   . Hyperlipidemia   . Neuropathy, diabetic   . Diabetic retinopathy    Past Surgical History  Procedure Date  . Cataract extraction, bilateral   . Colonoscopy w/ polypectomy   . Esophagogastroduodenoscopy 04/25/2012    Procedure: ESOPHAGOGASTRODUODENOSCOPY (EGD);  Surgeon: Meryl Dare, MD,FACG;  Location: [facility];  Service: Endoscopy;  Laterality: N/A;   Related Meds:     . aspirin  81 mg Oral Daily  . collagenase   Topical Daily  . ferrous sulfate  300 mg Per Tube TID  . fluconazole (DIFLUCAN) IV  200 mg Intravenous Daily  . heparin subcutaneous  5,000 Units Subcutaneous Q8H  . insulin aspart  0-20 Units Subcutaneous Q4H  . insulin glargine  10 Units Subcutaneous Daily  . insulin glargine  15 Units Subcutaneous QHS  . levothyroxine  75 mcg Per Tube QAC breakfast  . metoprolol tartrate  12.5 mg Per Tube BID  . predniSONE  60 mg Per Tube Q breakfast  . ranitidine  150 mg Oral BID   Ht:   5\' 9"  (175.3 cm)  Wt:  143 lb (65 kg)  Ideal Wt:    72.7 kg % Ideal Wt: 89%  Wt Readings from Last 25 Encounters:  04/26/12 143 lb 4.8 oz (65 kg)  04/26/12 143 lb 4.8 oz (65 kg)  11/22/11 138 lb 0.1 oz  (62.6 kg)  10/20/11 138 lb (62.596 kg)  Usual Wt: 138 lb % Usual Wt: 104%  BMI 21.2 - WNL  Food/Nutrition Related Hx:   Labs:  CMP     Component Value Date/Time   NA 131* 04/29/2012 0632   K 4.7 04/29/2012 0632   CL 94* 04/29/2012 0632   CO2 32 04/29/2012 0632   GLUCOSE 182* 04/29/2012 0632   BUN 26* 04/29/2012 0632   CREATININE 0.68 04/29/2012 0632   CALCIUM 7.9* 04/29/2012 0632   PROT 5.7* 04/29/2012 0632   ALBUMIN 1.5* 04/29/2012 0632   AST 23 04/29/2012 0632   ALT 18 04/29/2012 0632   ALKPHOS 153* 04/29/2012 0632   BILITOT 0.2* 04/29/2012 0632   GFRNONAA 87* 04/29/2012 0632   GFRAA >90 04/29/2012 0632   Sodium  Date/Time Value Range Status  04/29/2012  6:32 AM 131* 135 - 145 mEq/L Final  04/26/2012  3:55 AM 131* 135 - 145 mEq/L Final  04/24/2012  3:53 AM 138  135 - 145 mEq/L Final    Potassium  Date/Time Value Range Status  04/29/2012  6:32 AM 4.7  3.5 - 5.1 mEq/L Final  04/26/2012  3:55 AM 4.8  3.5 - 5.1 mEq/L Final  04/24/2012  3:53 AM 4.5  3.5 - 5.1 mEq/L Final    Phosphorus  Date/Time  Value Range Status  04/12/2012  4:00 AM 2.9  2.3 - 4.6 mg/dL Final  10/05/3084  5:78 AM 3.1  2.3 - 4.6 mg/dL Final  4/69/6295 28:41 PM 4.1  2.3 - 4.6 mg/dL Final    Magnesium  Date/Time Value Range Status  04/12/2012  4:00 AM 1.8  1.5 - 2.5 mg/dL Final  11/04/4399  0:27 AM 1.2* 1.5 - 2.5 mg/dL Final  2/53/6644  0:34 AM 1.7  1.5 - 2.5 mg/dL Final    CBG (last 3)   Basename 04/29/12 0712 04/29/12 0413 04/29/12 0021  GLUCAP 178* 163* 217*   Lab Results  Component Value Date   HGBA1C 10.5* 04/06/2012    Intake/Output Summary (Last 24 hours) at 04/29/12 1149 Last data filed at 04/29/12 0900  Gross per 24 hour  Intake    225 ml  Output   2550 ml  Net  -2325 ml   Diet Order: NPO  Supplements/Tube Feeding: Oxepa at 55 ml/hr  IVF:    feeding supplement (OXEPA) Last Rate: 1,000 mL (04/28/12 2238)   Estimated Nutritional Needs:   Kcal: 1700 - 1800 kcal Protein: 90 - 115 grams Fluid:  1.8 - 2 liters daily  Pt seen by RD during acute hospitalization. Pt was placed on ARDS protocol during mechanical ventilation and received Oxepa enteral nutrition formula during that time.  Pt is currently receiving Oxepa at 55 ml/hr (continuous) at this time. RN reports pt is tolerating TF well at this time, but pt has not had a BM per 8/20.  Discussed enteral nutrition regimen with RN and PA (Dan Anguilli) and will change formula to fiber-containing formula of Glucerna 1.2 to better suit patient's needs.  Noted pt with multiple stage II and stage III wounds.  Pt is at nutrition risk given advanced skin breakdown and recent acute hospitalization. Per chart, there may be possible gastrostomy feeding tube placement secondary to his dysphagia which appears to be long-standing and will continue to be monitored.   NUTRITION DIAGNOSIS: Inadequate oral intake r/t inability to eat AEB NPO status.  MONITORING/EVALUATION(Goals): Goal: Pt to meet >/= 90% of their estimated nutrition needs Monitor: weights, labs, TF tolerance, I/O's, diet advancement vs PEG  EDUCATION NEEDS: -No education needs identified at this time   DOCUMENTATION CODES Per approved criteria  -Not Applicable   Jarold Motto MS, RD, LDN Pager: [phone] After-hours pager: [phone]

## 2012-04-29 NOTE — Progress Notes (Signed)
Social Work Assessment and Plan Social Work Assessment and Plan  Patient Details  Name: [name] MRN: [id number] Date of Birth: [birth year]  Today's Date: 04/29/2012  Problem List:  Patient Active Problem List  Diagnosis  . Weakness  . Hypertension  . Diabetes mellitus  . Prostate cancer  . Hypothyroidism  . Hypokalemia  . Anemia  . Uncontrolled diabetes mellitus  . Cellulitis of lower leg  . Ulcer of leg due to secondary diabetes mellitus  . Diabetes with ulcer of foot  . Sacral decubitus ulcer  . Non-ST elevation myocardial infarction (NSTEMI), initial episode of care  . Pulmonary infiltrates  . Dysphagia  . Physical deconditioning  . Systolic dysfunction  . Hypernatremia  . Benign neoplasm of stomach  . Sepsis   Past Medical History:  Past Medical History  Diagnosis Date  . Diabetes mellitus   . Cancer   . Thyroid disease   . Anemia   . Hyperlipidemia   . Neuropathy, diabetic   . Diabetic retinopathy    Past Surgical History:  Past Surgical History  Procedure Date  . Cataract extraction, bilateral   . Colonoscopy w/ polypectomy   . Esophagogastroduodenoscopy 04/25/2012    Procedure: ESOPHAGOGASTRODUODENOSCOPY (EGD);  Surgeon: Meryl Dare, MD,FACG;  Location: [facility];  Service: Endoscopy;  Laterality: N/A;   Social History:  reports that he quit smoking about 5 years ago. His smoking use included Cigarettes. He smoked .25 packs per day. He has never used smokeless tobacco. He reports that he does not drink alcohol or use illicit drugs.  Family / Support Systems Marital Status: Married Patient Roles: Spouse;Parent Spouse/Significant Other: Zelma Legault-[phone]-home  [phone] Children: [name] [place]-daughter  [phone]-cell Other Supports: Juanna Cao [place]-daughter  [phone] Anticipated Caregiver: Wife and daughter Ability/Limitations of Caregiver: Wife can only provide supervision level, daughter works days and can help in the evenings,  another daughter resides in a group home but home every weekend may also assist Caregiver Availability: 24/7 Family Dynamics: Pt reports he has many family, it just keeps growing by leaps and bounds, he states.  He reports their are always people in their house, if not one than another.  Social History Preferred language: English Religion: Baptist Cultural Background: No issues Education: [employer] Read: Yes Write: Yes Employment Status: Retired Fish farm manager Issues: No issues Guardian/Conservator: None, according to MD pt is capable of making his own decisions   Abuse/Neglect Physical Abuse: Denies Verbal Abuse: Denies Sexual Abuse: Denies Exploitation of patient/patient's resources: Denies Self-Neglect: Denies  Emotional Status Pt's affect, behavior adn adjustment status: Pt is motivated to [employer] and get back home he is tired of being in a hosptial.  He is aware he needs to be supervision elvel to return home with his wife assisting.  He feels she can do more than she states.  He is willing to work in therapies while here and get stronger. Recent Psychosocial Issues: Other medical issues Pyschiatric History: No history depression screen was deferred due to pt being exhausted from therapies, will try at a later time.  Will also monitor his coping while here. Substance Abuse History: No issues  Patient / Family Perceptions, Expectations & Goals Pt/Family understanding of illness & functional limitations: Pt is able to explain his medical issues, he can not rememebr all of that has happened while here but most of it.  He reports his daughter has spoken to the MD and is well aware of his condition. Premorbid pt/family roles/activities: Husband, Father, Grandfaterh, Retiree, Home Owner,  etc Anticipated changes in roles/activities/participation: Resume at discharge Pt/family expectations/goals: Pt states: " I want to be stronger and get my strength back, I know it will  take time."  He is encouraged with how well therapy went today.  He reprots: " I was moving better than I thought I would."  [employer]: None Premorbid Home Care/DME Agencies: None Transportation available at discharge: [employer] referrals recommended: Support group (specify) (Caregivers Group)  Discharge Planning Living Arrangements: Spouse/significant other Support Systems: Spouse/significant other;Children;Other relatives;Friends/neighbors;Church/faith community Type of Residence: Private residence Insurance Resources: Media planner (specify) Risk manager Medicare) Financial Resources: Restaurant manager, fast food Screen Referred: No Living Expenses: Lives with family Money Management: Family Do you have any problems obtaining your medications?: No Home Management: Technical sales engineer Preliminary Plans: Return home with wife and daughter to assist, has extended family to also help at home.  He needs to atleast get to supervision level for wife who is his primary caregiver to have him home. Social Work Anticipated Follow Up Needs: HH/OP;Support Group  Clinical Impression Pleasant gentleman who is exhausted from therapies, first time really up and about since being  hospitalized since 8/1.  Wants to reach atleast supervision level for wife to be able to take care of him. Seems to be coping appropriately, await team's eval.  [name], Lemar Livings 04/29/2012, 2:55 PM

## 2012-04-30 ENCOUNTER — Inpatient Hospital Stay (HOSPITAL_COMMUNITY): Payer: Medicare Other

## 2012-04-30 ENCOUNTER — Inpatient Hospital Stay (HOSPITAL_COMMUNITY): Payer: Medicare Other | Admitting: Physical Therapy

## 2012-04-30 ENCOUNTER — Encounter (HOSPITAL_COMMUNITY): Payer: Medicare Other

## 2012-04-30 LAB — GLUCOSE, CAPILLARY
Glucose-Capillary: 191 mg/dL — ABNORMAL HIGH (ref 70–99)
Glucose-Capillary: 155 mg/dL — ABNORMAL HIGH (ref 70–99)

## 2012-04-30 LAB — URINE MICROSCOPIC-ADD ON

## 2012-04-30 LAB — URINALYSIS, ROUTINE W REFLEX MICROSCOPIC
Nitrite: NEGATIVE
Protein, ur: 30 mg/dL — AB
Specific Gravity, Urine: 1.018 (ref 1.005–1.030)
Urobilinogen, UA: 1 mg/dL (ref 0.0–1.0)

## 2012-04-30 LAB — CBC
Platelets: 511 10*3/uL — ABNORMAL HIGH (ref 150–400)
RBC: 2.41 MIL/uL — ABNORMAL LOW (ref 4.22–5.81)
RDW: 15.4 % (ref 11.5–15.5)
WBC: 26.5 10*3/uL — ABNORMAL HIGH (ref 4.0–10.5)

## 2012-04-30 MED ORDER — CIPROFLOXACIN HCL 250 MG PO TABS
250.0000 mg | ORAL_TABLET | Freq: Two times a day (BID) | ORAL | Status: DC
  Administered 2012-05-01 – 2012-05-02 (×2): 250 mg via NASOGASTRIC
  Filled 2012-04-30 (×6): qty 1

## 2012-04-30 NOTE — Progress Notes (Addendum)
Subjective/Complaints: Feels good. Denies pain. Excited to have swallowing test today. A 12 point review of systems has been performed and if not noted above is otherwise negative.   Objective: Vital Signs: Blood pressure 120/52, pulse 73, temperature 98.3 F (36.8 C), temperature source Oral, resp. rate 19, SpO2 97.00%. No results found.  Basename 04/29/12 0632  WBC 22.6*  HGB 7.5*  HCT 24.1*  PLT 518*    Basename 04/29/12 0632  NA 131*  K 4.7  CL 94*  CO2 32  GLUCOSE 182*  BUN 26*  CREATININE 0.68  CALCIUM 7.9*   CBG (last 3)   Basename 04/30/12 0726 04/30/12 0400 04/30/12 0009  GLUCAP 189* 187* 250*    Wt Readings from Last 3 Encounters:  04/26/12 65 kg (143 lb 4.8 oz)  04/26/12 65 kg (143 lb 4.8 oz)  11/22/11 62.6 kg (138 lb 0.1 oz)    Physical Exam:  HENT:  Head: Normocephalic.  NGT in place. Missing many teeth. Mucosa pink but with white film over tongue and pharynx. Phonation better Eyes:  Pupils round and reactive to light  Neck: Neck supple. No thyromegaly present.  Cardiovascular: Regular rhythm. Normal rate Pulmonary/Chest: Breath sounds normal. No respiratory distress. He has no wheezes. Occasional upper airway sounds heard Abdominal: Bowel sounds are normal. He exhibits no distension.  Neurological: He is alert.  Patient is a poor historian. He was able to give his date of birth and age. He could name the hospital with multiple cues. He would follow simple commands. Poor insight and awareness. He had difficulty controlling his secretions and a weak cough. Strength was symmetrical but he was weak more proximally than distally (2-3 prox to 4 distally). No focal sensory loss was discerned.  Skin:  Skin: 4cm lateral mallelolus wound with fibronecrotic tissue. Buttock areas with fibronecrotic tissue also (x 2) about 1-2cm in diameter Psychiatric:  Patient with improved awareness and insight   Assessment/Plan: 1. Functional deficits secondary to  sepsis/deconditioning which require 3+ hours per day of interdisciplinary therapy in a comprehensive inpatient rehab setting. Physiatrist is providing close team supervision and 24 hour management of active medical problems listed below. Physiatrist and rehab team continue to assess barriers to discharge/monitor patient progress toward functional and medical goals. FIM: FIM - Bathing Bathing Steps Patient Completed: Right Arm;Left Arm;Chest;Abdomen Bathing: 2: Max-Patient completes 3-4 66f 10 parts or 25-49%  FIM - Upper Body Dressing/Undressing Upper body dressing/undressing steps patient completed: Thread/unthread right sleeve of pullover shirt/dresss;Thread/unthread left sleeve of pullover shirt/dress Upper body dressing/undressing: 3: Mod-Patient completed 50-74% of tasks FIM - Lower Body Dressing/Undressing Lower body dressing/undressing: 1: Total-Patient completed less than 25% of tasks  FIM - Toileting Toileting: 0: Activity did not occur  FIM - Diplomatic Services operational officer Devices: Grab bars;Elevated toilet seat Toilet Transfers: 0-Activity did not occur  FIM - Banker Devices: Manufacturing systems engineer Transfer: 3: Supine > Sit: Mod A (lifting assist/Pt. 50-74%/lift 2 legs;3: Bed > Chair or W/C: Mod A (lift or lower assist);3: Chair or W/C > Bed: Mod A (lift or lower assist)  FIM - Locomotion: Wheelchair Locomotion: Wheelchair: 0: Activity did not occur FIM - Locomotion: Ambulation Locomotion: Ambulation Assistive Devices: Designer, industrial/product Ambulation/Gait Assistance: 3: Mod assist  Comprehension Comprehension Mode: Auditory Comprehension: 5-Follows basic conversation/direction: With extra time/assistive device  Expression Expression Mode: Verbal Expression: 5-Expresses basic needs/ideas: With extra time/assistive device  Social Interaction Social Interaction: 5-Interacts appropriately 90% of the time - Needs monitoring or  encouragement for participation or interaction.  Problem Solving Problem Solving: 4-Solves basic 75 - 89% of the time/requires cueing 10 - 24% of the time  Memory Memory: 4-Recognizes or recalls 75 - 89% of the time/requires cueing 10 - 24% of the time Medical Problem List and Plan:  1. deconditioning related to sepsis multiple medical complications/metabolic encephalopathy/respiratory failure. Will discuss with critical care medicine on weaning of prednisone  2. DVT Prophylaxis/Anticoagulation: Subcutaneous heparin. Monitor platelet counts and any signs of bleeding  3. Pain Management: Lortab as needed. Monitor with increased mobility  4. Neuropsych: This patient is not capable of making decisions on his/her own behalf.  5. Dysphagia. Continue nasogastric tubes for now for nutritional support. Will make further evaluation for possible gastrostomy tube for long-term nutrition   -f/u MBS per SLP today-i'm optimistic   -phonation is better and throat is less painful 6.NSTEMI. Followup per cardiology services. Continue aspirin therapy as well as Lopressor 12.5 mg twice a day (hold today). No current chest pain or shortness of breath  7. Esophageal candidiasis. Diflucan x7 days. Sx improving 8. Diabetes mellitus with peripheral neuropathy. Hemoglobin A1c 10.5. Lantus insulin 15 units at bedtime. added am lantus as well to better cover pm cbgs.  Check blood sugars a.c. and at bedtime   -continue to titrate as needed. 9. Right lateral malleolus on left lower buttocks decubitus ulcers. Santyl for right lateral malleolus breakdown 10. Anemia. Latest hemoglobin 7.8. Monitor for a bleeding episodes and followup CBC with plan to transfuse if symptomatic  11. Hypothyroidism. Synthroid  12. Medical noncompliance. Will discuss at length with patient and family the need to maintain medical regimen 13. Leukocytosis: recheck lab today. Collect urine for analysis and culture  -afebrile. No active signs of  infection  -potential steroid effect as well.   LOS (Days) 4 A FACE TO FACE EVALUATION WAS PERFORMED  Nyia Tsao T 04/30/2012, 8:12 AM

## 2012-04-30 NOTE — Progress Notes (Signed)
Inpatient Diabetes Program Recommendations  AACE/ADA: New Consensus Statement on Inpatient Glycemic Control (2013)  Target Ranges:  Prepandial:   less than 140 mg/dL      Peak postprandial:   less than 180 mg/dL (1-2 hours)      Critically ill patients:  140 - 180 mg/dL   Reason for Visit: Hyperglycemia  Inpatient Diabetes Program Recommendations Insulin - Basal: xxxxxxx Correction (SSI): If add tube feed coverage per below, can decrease correction insulin to sensitive q 4 hrs. Insulin - Meal Coverage: Recommend using tube feed coverage of 4 unis q 4 hrs to cover carbohydrate content. (in addition to correction which could be decreased to sensitive q 4 hrs.)  Note: Thank you, Lenor Coffin, RN, CNS, Diabetes Coordinator [phone])

## 2012-04-30 NOTE — Progress Notes (Signed)
Occupational Therapy Session Note  Patient Details  Name: [name] MRN: [id number] Date of Birth: 1928/12/25  Today's Date: 04/30/2012 Time: 1100-1155 Time Calculation (min): 55 min  Short Term Goals: Week 1:  OT Short Term Goal 1 (Week 1): Pt will tolerate 3 mins standing during grooming activities before needing to rest. OT Short Term Goal 2 (Week 1): Pt will perform LB bathing and dressing with min assist sit to stand with AE PRN. OT Short Term Goal 3 (Week 1): Pt will perform toilet transfers with min assist using RW to 3:1 OT Short Term Goal 4 (Week 1): Pt will perform BUE light therex with suprevision to increase overall endurance for basic selfcare tasks and functional transfers.  Skilled Therapeutic Interventions/Progress Updates:    Pt engaged in bathing and dressing tasks seated in w/c at sink with sit<>stand to bathe buttocks and pull up pants.  Pt required extra time to complete all tasks and required rest breaks between each individual task.  Pt required min A for sit to stand and steady A while standing.  While bathing buttocks with only one UE support on sink patient required min/mod A to maintain upright posture.  Pt O2 sats remained >90% on 2L O2. Focus on activity tolerance and standing balance.  Therapy Documentation Precautions:  Precautions Precautions: Fall Precaution Comments: NG tube, multiple decubitus lcers to sacrum and lower legs Required Braces or Orthoses: Other Brace/Splint Other Brace/Splint: pressure relieving heel protectors when in bed Restrictions Weight Bearing Restrictions: No  See FIM for current functional status  Therapy/Group: Individual Therapy  [name] 04/30/2012, 11:59 AM

## 2012-04-30 NOTE — Patient Care Conference (Signed)
Inpatient RehabilitationTeam Conference Note Date: 04/30/2012   Time: 2:00PM    Patient Name: [name]      Medical Record Number: [id number]  Date of Birth: 25-Sep-1928 Sex: Male         Room/Bed: 4009/4009-01 Payor Info: Payor: MEDICARE  Plan: MEDICARE PART A AND B  Product Type: *No Product type*     Admitting Diagnosis: deconditioned  Admit Date/Time:  04/26/2012  6:17 PM Admission Comments: No comment available   Primary Diagnosis:  Sepsis Principal Problem: Sepsis  Patient Active Problem List   Diagnosis Date Noted  . Sepsis 04/29/2012  . 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 Pulmonary infiltrates 04/19/2012  . Dysphagia 04/19/2012  . Physical deconditioning 04/19/2012  . Systolic dysfunction 04/19/2012  . Hypernatremia 04/19/2012  . Non-ST elevation myocardial infarction (NSTEMI), initial episode of care 04/05/2012  . Diabetes with ulcer of foot 04/04/2012  . Sacral decubitus ulcer 04/04/2012  . Uncontrolled diabetes mellitus 11/22/2011  . Cellulitis of lower leg 11/22/2011  . Ulcer of leg due to secondary diabetes mellitus 11/22/2011  . Hypokalemia 10/21/2011  . Anemia 10/21/2011  . Weakness 10/20/2011  . Hypertension 10/20/2011  . Diabetes mellitus 10/20/2011  . Prostate cancer 10/20/2011  . Hypothyroidism 10/20/2011    Expected Discharge Date: Expected Discharge Date: 05/15/12  Team Members Present: Physician: Dr. Faith Rogue Social Worker Present: Dossie Der, LCSW Nurse Present: Daryll Brod, RN;Other (comment) Charolette Child Coordiantor) PT Present: Karolee Stamps, PT;Becky Henrene Dodge, PT;Other (comment) Rose Fillers) OT Present: Edwin Cap, OT SLP Present: Feliberto Gottron, SLP Other (Discipline and Name): Charolette Child Coordinator     Current Status/Progress Goal Weekly Team Focus  Medical   difficulty swallowing still due to esophagitis, premorbid swallowing?, very deconditioing  maintain nutrition, rx infection  see above     Bowel/Bladder   Incontinent of bladder (condom cath) Continent of bowel  Continent of bowel and bladder  Timed tolieting q3 in am , condom cath at night   Swallow/Nutrition/ Hydration   NPO  Min A with least restrictive diet  Trials of thin, ice chips and puree    ADL's   mod/max A overall; limited activity tolerance  supervision overall  activity tolerance; standing balance;    Mobility   min/mod assist with transfers, limited ambulation and activity tolerance. Decreased awareness of level of function.  Supervision  Improved endurance, standing balance, strength, wheelchair mobility, pressure relief.    Communication             Safety/Cognition/ Behavioral Observations  Min A  Supervision  attention, awarenes, working memory    Pain   No complaints of pain  No complaints of pain  Monitor for pain   Skin   stage 2 ulcers ( sacrum,  right and left bottocks), 2 lower left leg ulcer, right lowe leg ulcer   No new skin breakdown  Monitor skin for new breakdown, q3 turns to prevent further breakdown      *See Interdisciplinary Assessment and Plan and progress notes for long and short-term goals  Barriers to Discharge: profound weakness, wife's ability to physically assist    Possible Resolutions to Barriers:  strength and endurance training    Discharge Planning/Teaching Needs:  Very decoditioned, wife can provide supervision level only due to her own health issues, daughter works days helps in the evenings.      Team Discussion:  Pt very deconditioned, failed MBS. NG pulled out need to replace.  Motivated to participate in therapies  Revisions to Treatment  Plan:  None   Continued Need for Acute Rehabilitation Level of Care: The patient requires daily medical management by a physician with specialized training in physical medicine and rehabilitation for the following conditions: Daily direction of a multidisciplinary physical rehabilitation program to ensure safe treatment  while eliciting the highest outcome that is of practical value to the patient.: Yes Daily medical management of patient stability for increased activity during participation in an intensive rehabilitation regime.: Yes Daily analysis of laboratory values and/or radiology reports with any subsequent need for medication adjustment of medical intervention for : Other;Neurological problems;Post surgical problems  [name], [name] 04/30/2012, 3:04 PM

## 2012-04-30 NOTE — Significant Event (Signed)
Late entry: Was advised at 1330 that NG tube inadvertently came out when pt transferred without staff assist. Order received to replace NG tube and to send down to x-ray for verification of proper placement. NG tube placed @1540 , pt sent for x-ray. Received called for Radiologist regarding improper placement. NG reinserted. Pt sent back to x-ray for reverification of placement. No call from x-ray at this time. On coming nurse to monitor.

## 2012-04-30 NOTE — Progress Notes (Signed)
Social Work Patient ID: [name], male   DOB: Nov 30, 1928, 78 y.o.   MRN: [id number] Met with pt and spoke with daughter-Sheila via telephone to inform team conference goals-supervision level and discharge 9/11.  Aware pt's NG tube came out and he failed his swallow test.  Informed would wait 7 more days and repeat swallow test, to see if improvement. Pt reports was tired wanted to get back in bed so he did, without waiting for assistance.  All in agreement with team's recommendations and aware time it takes To regain strength.  Work on discharge plans.

## 2012-04-30 NOTE — Procedures (Signed)
Objective Swallowing Evaluation: Modified Barium Swallowing Study  Patient Details  Name: [name] MRN: [id number] Date of Birth: 22-Jul-1929  Today's Date: 04/30/2012 Time: 0950-1010 Time Calculation (min): 20 min  Past Medical History:  Past Medical History  Diagnosis Date  . Diabetes mellitus   . Cancer   . Thyroid disease   . Anemia   . Hyperlipidemia   . Neuropathy, diabetic   . Diabetic retinopathy    Past Surgical History:  Past Surgical History  Procedure Date  . Cataract extraction, bilateral   . Colonoscopy w/ polypectomy   . Esophagogastroduodenoscopy 04/25/2012    Procedure: ESOPHAGOGASTRODUODENOSCOPY (EGD);  Surgeon: Meryl Dare, MD,FACG;  Location: [facility];  Service: Endoscopy;  Laterality: N/A;   HPI:        Recommendation/Prognosis  Clinical Impression Dysphagia Diagnosis: Suspected primary esophageal dysphagia;Moderate cervical esophageal phase dysphagia;Mild oral phase dysphagia;Mild pharyngeal phase dysphagia Clinical impression: Patient continues to present with a multifactorial dysphagia with oropharyngeal, cervical esophageal and suspected lower esophageal deficits. Patient with mildly delayed oral transit with lingual pumping followed by premature spillage of bolus over base of tongue and delayed swallow initiation to the pyriform sinuses with thin and nectar thick liquids, when combined with decreased UES relaxation result in deep penetration during the swallow. Attempted chin tuck and bolus size modification which was only intermittently successful to prevent. Addtionally, although patient able to initially protect airway with pureed and honey thick consistencies, backflow of bolus from esophagus into the pyriform sinuses post swallow results in eventual penetration as well. Patient does consistently sense penetrates, however cough weak and will likely not be successful to protect the airway fully.  Based on previous MBS documentation, is appears  that patient may have been experiencing dysphagia for several years prior to admission which this SLP suspects is now exacerbated by acute condition including generalized weakness with decreased functional reserve, and recently diagnosed esophageal candida. Recommend continued NPO status with the exception of po trials with treating SLP and repeat MBS in 5-7 days with hopeful improvement. May benefit from having Panda tube removed prior to next exam as likely worsening already tight UES.  Swallow Evaluation Recommendations Diet Recommendations: NPO;Alternative means - temporary (po trials with SLP only) Medication Administration: Via alternative means Oral Care Recommendations: Oral care QID Other Recommendations: Have oral suction available Follow up Recommendations: Inpatient Rehab Prognosis Prognosis for Safe Diet Advancement: Good Barriers to Reach Goals: Severity of dysphagia (suspected chronic dysphagia) Individuals Consulted Consulted and Agree with Results and Recommendations: Patient (primary SLP)  SLP Assessment/Plan Dysphagia Diagnosis: Suspected primary esophageal dysphagia;Moderate cervical esophageal phase dysphagia;Mild oral phase dysphagia;Mild pharyngeal phase dysphagia Clinical impression: Patient continues to present with a multifactorial dysphagia with oropharyngeal, cervical esophageal and suspected lower esophageal deficits. Patient with mildly delayed oral transit with lingual pumping followed by premature spillage of bolus over base of tongue and delayed swallow initiation to the pyriform sinuses with thin and nectar thick liquids, when combined with decreased UES relaxation result in deep penetration during the swallow. Attempted chin tuck and bolus size modification which was only intermittently successful to prevent. Addtionally, although patient able to initially protect airway with pureed and honey thick consistencies, backflow of bolus from esophagus into the pyriform  sinuses post swallow results in eventual penetration as well. Patient does consistently sense penetrates, however cough weak and will likely not be successful to protect the airway fully.  Based on previous MBS documentation, is appears that patient may have been experiencing dysphagia for several years prior  to admission which this SLP suspects is now exacerbated by acute condition including generalized weakness with decreased functional reserve, and recently diagnosed esophageal candida. Recommend continued NPO status with the exception of po trials with treating SLP and repeat MBS in 5-7 days with hopeful improvement. May benefit from having Panda tube removed prior to next exam as likely worsening already tight UES.      General:  HPI:   Type of Study: Modified Barium Swallowing Study Reason for Referral: Objectively evaluate swallowing function Previous Swallow Assessment: MBS 04/23/12 and recommended to continue NPO Diet Prior to this Study: NPO;Panda Temperature Spikes Noted: No Respiratory Status: Supplemental O2 delivered via (comment) History of Recent Intubation: Yes Length of Intubations (days): 13 days Date extubated: 04/18/12 Behavior/Cognition: Alert;Cooperative;Hard of hearing Oral Cavity - Dentition: Missing dentition Oral Motor / Sensory Function: Impaired - see Bedside swallow eval Self-Feeding Abilities: Able to feed self Patient Positioning: Upright in chair Baseline Vocal Quality: Low vocal intensity;Clear Volitional Cough: Weak Volitional Swallow: Able to elicit Anatomy: Other (Comment) (? mild osteophytes along cervical spine around C5-6) Pharyngeal Secretions: Not observed secondary MBS  Reason for Referral:  Objectively evaluate swallowing function   Oral Phase Oral Preparation/Oral Phase Oral Phase: Impaired Oral - Honey Oral - Honey Teaspoon: Lingual pumping;Reduced posterior propulsion Oral - Nectar Oral - Nectar Teaspoon: Lingual pumping;Reduced  posterior propulsion Oral - Nectar Cup: Lingual pumping;Reduced posterior propulsion Oral - Thin Oral - Thin Teaspoon: Lingual pumping;Reduced posterior propulsion Oral - Thin Cup: Lingual pumping;Reduced posterior propulsion Oral - Thin Straw: Not tested Oral - Solids Oral - Puree: Lingual pumping;Reduced posterior propulsion Oral - Regular: Not tested (patient refused) Pharyngeal Phase  Pharyngeal Phase Pharyngeal Phase: Impaired Pharyngeal - Honey Pharyngeal - Honey Teaspoon: Delayed swallow initiation;Premature spillage to valleculae;Reduced tongue base retraction;Reduced anterior laryngeal mobility;Reduced laryngeal elevation;Penetration/Aspiration after swallow;Pharyngeal residue - valleculae;Pharyngeal residue - pyriform sinuses Penetration/Aspiration details (honey teaspoon): Material enters airway, CONTACTS cords then ejected out Pharyngeal - Nectar Pharyngeal - Nectar Teaspoon: Delayed swallow initiation;Premature spillage to pyriform sinuses;Reduced anterior laryngeal mobility;Reduced laryngeal elevation;Reduced tongue base retraction;Pharyngeal residue - valleculae;Penetration/Aspiration during swallow;Pharyngeal residue - pyriform sinuses Penetration/Aspiration details (nectar teaspoon): Material enters airway, CONTACTS cords then ejected out Pharyngeal - Nectar Cup: Delayed swallow initiation;Premature spillage to pyriform sinuses;Reduced anterior laryngeal mobility;Reduced laryngeal elevation;Reduced tongue base retraction;Penetration/Aspiration during swallow;Pharyngeal residue - valleculae;Pharyngeal residue - pyriform sinuses Penetration/Aspiration details (nectar cup): Material enters airway, CONTACTS cords then ejected out Pharyngeal - Thin Pharyngeal - Thin Teaspoon: Delayed swallow initiation;Premature spillage to valleculae;Reduced anterior laryngeal mobility;Reduced laryngeal elevation;Reduced tongue base retraction;Penetration/Aspiration during swallow;Pharyngeal residue  - valleculae;Pharyngeal residue - pyriform sinuses Penetration/Aspiration details (thin teaspoon): Material enters airway, CONTACTS cords then ejected out Pharyngeal - Thin Cup: Delayed swallow initiation;Premature spillage to pyriform sinuses;Reduced anterior laryngeal mobility;Reduced laryngeal elevation;Reduced tongue base retraction;Penetration/Aspiration during swallow;Pharyngeal residue - valleculae;Pharyngeal residue - pyriform sinuses Penetration/Aspiration details (thin cup): Material enters airway, CONTACTS cords then ejected out Pharyngeal - Thin Straw: Not tested Pharyngeal - Solids Pharyngeal - Puree: Delayed swallow initiation;Premature spillage to valleculae;Reduced anterior laryngeal mobility;Reduced laryngeal elevation;Reduced tongue base retraction;Pharyngeal residue - valleculae Pharyngeal - Regular: Not tested (patient refused) Cervical Esophageal Phase  Cervical Esophageal Phase Cervical Esophageal Phase: Impaired Cervical Esophageal Phase - Honey Honey Teaspoon: Reduced cricopharyngeal relaxation;Esophageal backflow into the pharynx;Esophageal backflow into the larynx Cervical Esophageal Phase - Nectar Nectar Teaspoon: Reduced cricopharyngeal relaxation;Esophageal backflow into the pharynx Nectar Cup: Reduced cricopharyngeal relaxation;Esophageal backflow into the pharynx Cervical Esophageal Phase - Thin Thin Teaspoon: Reduced cricopharyngeal relaxation;Esophageal backflow into the  pharynx Thin Cup: Reduced cricopharyngeal relaxation;Esophageal backflow into the pharynx Thin Straw: Not tested Cervical Esophageal Phase - Solids Puree: Reduced cricopharyngeal relaxation;Esophageal backflow into the pharynx Regular: Not tested Cervical Esophageal Phase - Comment Cervical Esophageal Comment: ? combination of chronic esophageal deficits given report of swallowing difficulty x several years, newly diagnosed esophageal candida, and presence of Panda tube  Olan Kurek  Meryl 04/30/2012, 10:45 AM

## 2012-04-30 NOTE — Progress Notes (Signed)
Physical Therapy Session Note  Patient Details  Name: [name] MRN: [id number] Date of Birth: 11/29/1928  Today's Date: 04/30/2012 Time: 1017-1100 Time Calculation (min): 43 min  Short Term Goals: Week 1:  PT Short Term Goal 1 (Week 1): Patient will be able to perform Bed mobility with Min-Assist PT Short Term Goal 2 (Week 1): Patient will be able to perform Transfers with Min-Assist PT Short Term Goal 3 (Week 1): Patient will be able to perform gait using RW x 30' with Min-Assist PT Short Term Goal 4 (Week 1): Patient will be able to ascend/descend 2 steps using B handrails with Min-Assist  Skilled Therapeutic Interventions/Progress Updates:    Pt just back from testing and fatigued. Pt's bed noted to be wet, assisted pt with cleaning self secondary to catheter malfunction. Pt with decreased ability to assist with donning pants, prefers to have others do it for him. Stand pivot transfer performed with min assist. Pt set up with pressure relief cushion and smaller wheelchair for trial. Short standing tolerance activities. SpO2 >96% on 2L  O2 throughout session. Pt requires cues throughout for safety awareness and participation.  Therapy Documentation Precautions:  Precautions Precautions: Fall Precaution Comments: NG tube, multiple decubitus lcers to sacrum and lower legs Required Braces or Orthoses: Other Brace/Splint Other Brace/Splint: pressure relieving heel protectors when in bed Restrictions Weight Bearing Restrictions: No Pain: Pain Assessment Pain Assessment: No/denies pain Pain Score: 0-No pain Locomotion : Wheelchair Mobility Distance: 25' in room attempting to negotiated small space performed with supervision/verbal cues  See FIM for current functional status  Therapy/Group: Individual Therapy  [name] 04/30/2012, 12:14 PM

## 2012-04-30 NOTE — Progress Notes (Addendum)
Physical Therapy Session Note  Patient Details  Name: [name] MRN: [id number] Date of Birth: 01/03/1929  Today's Date: 04/30/2012 Time: 1300-1400 Time Calculation (min): 60 min  Short Term Goals: Week 1:  PT Short Term Goal 1 (Week 1): Patient will be able to perform Bed mobility with Min-Assist PT Short Term Goal 2 (Week 1): Patient will be able to perform Transfers with Min-Assist PT Short Term Goal 3 (Week 1): Patient will be able to perform gait using RW x 30' with Min-Assist PT Short Term Goal 4 (Week 1): Patient will be able to ascend/descend 2 steps using B handrails with Min-Assist  Skilled Therapeutic Interventions/Progress Updates:    Pt found lying completely supine in bed, apparently having undone posy belt and transferred self wheelchair > bed unsupervised, pt reports no falls. NG tube noted to be running and out of place. Put feed on hold, elevated head and called RN.  RN removing what line was left in nose. Reeducated pt on need to call for assist, pt verbalized understanding and verbalized how to call for nursing by using red button.   Therapy included wheelchair mobility in room and down hallway for management and conditioning x 100' with supervision, decreased ability to maintain straight path. Standing tolerance and endurance activities with horseshoes and forward foot taps. Pt requires frequent and extended rests breaks throughout due to fatigue. Stand pivot transfers performed with min assist.   SpO2 remained >92%, mostly 96% throughout therapy session while on room air. At end of session pt SpO2 = 86% and placed on 2L [place] O2, SpO2= 95%.   Therapy Documentation Precautions:  Precautions Precautions: Fall Precaution Comments: NG tube, multiple decubitus lcers to sacrum and lower legs Required Braces or Orthoses: Other Brace/Splint Other Brace/Splint: pressure relieving heel protectors when in bed Restrictions Weight Bearing Restrictions: No Pain: Pain  Assessment Pain Assessment: No/denies pain Locomotion : Wheelchair Mobility Distance: 100'   See FIM for current functional status  Therapy/Group: Individual Therapy  [name] 04/30/2012, 2:33 PM

## 2012-05-01 ENCOUNTER — Inpatient Hospital Stay (HOSPITAL_COMMUNITY): Payer: Medicare Other | Admitting: Speech Pathology

## 2012-05-01 ENCOUNTER — Inpatient Hospital Stay (HOSPITAL_COMMUNITY): Payer: Medicare Other

## 2012-05-01 ENCOUNTER — Inpatient Hospital Stay (HOSPITAL_COMMUNITY): Payer: Medicare Other | Admitting: Physical Therapy

## 2012-05-01 ENCOUNTER — Inpatient Hospital Stay (HOSPITAL_COMMUNITY): Payer: Medicare Other | Admitting: Occupational Therapy

## 2012-05-01 LAB — CBC
MCH: 32.4 pg (ref 26.0–34.0)
MCHC: 31.7 g/dL (ref 30.0–36.0)
Platelets: 471 10*3/uL — ABNORMAL HIGH (ref 150–400)
RDW: 15.7 % — ABNORMAL HIGH (ref 11.5–15.5)

## 2012-05-01 LAB — BASIC METABOLIC PANEL
Calcium: 8.3 mg/dL — ABNORMAL LOW (ref 8.4–10.5)
GFR calc Af Amer: >90 mL/min (ref >90)
GFR calc non Af Amer: 86 mL/min — ABNORMAL LOW (ref >90)
Potassium: 4.7 mEq/L (ref 3.5–5.1)
Sodium: 133 mEq/L — ABNORMAL LOW (ref 135–145)

## 2012-05-01 LAB — GLUCOSE, CAPILLARY
Glucose-Capillary: 213 mg/dL — ABNORMAL HIGH (ref 70–99)
Glucose-Capillary: 219 mg/dL — ABNORMAL HIGH (ref 70–99)
Glucose-Capillary: 265 mg/dL — ABNORMAL HIGH (ref 70–99)

## 2012-05-01 MED ORDER — INSULIN GLARGINE 100 UNIT/ML ~~LOC~~ SOLN
20.0000 [IU] | Freq: Every day | SUBCUTANEOUS | Status: DC
  Administered 2012-05-01 – 2012-05-04 (×4): 20 [IU] via SUBCUTANEOUS

## 2012-05-01 MED ORDER — INSULIN GLARGINE 100 UNIT/ML ~~LOC~~ SOLN
15.0000 [IU] | Freq: Every day | SUBCUTANEOUS | Status: DC
  Administered 2012-05-02 – 2012-05-05 (×4): 15 [IU] via SUBCUTANEOUS

## 2012-05-01 MED ORDER — SORBITOL 70 % SOLN
30.0000 mL | Freq: Every day | Status: DC | PRN
  Administered 2012-05-01: 30 mL
  Filled 2012-05-01: qty 30

## 2012-05-01 MED ORDER — FREE WATER
200.0000 mL | Freq: Three times a day (TID) | Status: DC
  Administered 2012-05-01 – 2012-05-03 (×6): 200 mL

## 2012-05-01 NOTE — Progress Notes (Signed)
Patient NG tube placed about 1000. Patient had placement verified later after tube moved. Verification confirmed. Tube feeding started back about 1400. Patient Continues to be groggy during the day but when awaken is alert and oriented x 3. Patient given sorbitol about 1430. No results at that time. Patient denies ant pain. Patient chest still congested. Cough nonproductive. No other complaints noted. Continue with plan of care.

## 2012-05-01 NOTE — Progress Notes (Signed)
Nutrition Follow-up  Intervention:   1. Resume Glucerna 1.2 at 75 ml/hr x 20 hours with 30 ml Prostat via tube daily as medically able, once tube placed and placement verified. 2. RD to continue to follow. Will make recommendations based on diet advancement vs PEG. 3. Education needs to be addressed closer to d/c.  Assessment:   MBSS completed yesterday, SLP recommends continued NPO status with the exception of po trials with treating SLP and repeat MBS in 5-7 days with hopeful improvement.  Panda tube has yet to be replaced successfully s/p MBSS per chart. Tube feed is currently off, plan to replace tube after therapies, per RN.  Daughter at bedside asking this RD for DM education with patient once closer to d/c. Discussed role of RD and plan to educate closer to d/c and if/when pt placed on diet.  Diet Order:  NPO TF: Glucerna 1.2 at 75 ml/hr x 20 hours. 30 ml Prostat via tube daily. This regimen will provide: 1900 kcal, 105 grams protein, 1208 ml free water. Free water of 200 ml TID.  Meds: Scheduled Meds:   . aspirin  81 mg Oral Daily  . ciprofloxacin  250 mg Per NG tube BID  . collagenase   Topical Daily  . feeding supplement  30 mL Per Tube Daily  . ferrous sulfate  300 mg Per Tube TID  . fluconazole (DIFLUCAN) IV  200 mg Intravenous Daily  . free water  200 mL Per Tube Q8H  . heparin subcutaneous  5,000 Units Subcutaneous Q8H  . insulin aspart  0-20 Units Subcutaneous Q4H  . insulin glargine  15 Units Subcutaneous Daily  . insulin glargine  20 Units Subcutaneous QHS  . levothyroxine  75 mcg Per Tube QAC breakfast  . metoprolol tartrate  12.5 mg Per Tube BID  . predniSONE  60 mg Per Tube Q breakfast  . ranitidine  150 mg Oral BID  . DISCONTD: insulin glargine  10 Units Subcutaneous Daily  . DISCONTD: insulin glargine  15 Units Subcutaneous QHS   Continuous Infusions:   . feeding supplement (GLUCERNA 1.2 CAL) 1,000 mL (04/29/12 1521)   PRN Meds:.albuterol, antiseptic  oral rinse, HYDROcodone-acetaminophen, ipratropium, ondansetron (ZOFRAN) IV, ondansetron  Labs:  CMP     Component Value Date/Time   NA 133* 05/01/2012 0539   K 4.7 05/01/2012 0539   CL 94* 05/01/2012 0539   CO2 33* 05/01/2012 0539   GLUCOSE 230* 05/01/2012 0539   BUN 27* 05/01/2012 0539   CREATININE 0.70 05/01/2012 0539   CALCIUM 8.3* 05/01/2012 0539   PROT 5.7* 04/29/2012 0632   ALBUMIN 1.5* 04/29/2012 0632   AST 23 04/29/2012 0632   ALT 18 04/29/2012 0632   ALKPHOS 153* 04/29/2012 0632   BILITOT 0.2* 04/29/2012 0632   GFRNONAA 86* 05/01/2012 0539   GFRAA >90 05/01/2012 0539   Sodium  Date/Time Value Range Status  05/01/2012  5:39 AM 133* 135 - 145 mEq/L Final  04/29/2012  6:32 AM 131* 135 - 145 mEq/L Final  04/26/2012  3:55 AM 131* 135 - 145 mEq/L Final    Potassium  Date/Time Value Range Status  05/01/2012  5:39 AM 4.7  3.5 - 5.1 mEq/L Final  04/29/2012  6:32 AM 4.7  3.5 - 5.1 mEq/L Final  04/26/2012  3:55 AM 4.8  3.5 - 5.1 mEq/L Final   CBG (last 3)   Basename 05/01/12 0710 05/01/12 0413 05/01/12 0013  GLUCAP 221* 219* 265*   Lab Results  Component Value Date  HGBA1C 10.5* 04/06/2012    Intake/Output Summary (Last 24 hours) at 05/01/12 1114 Last data filed at 04/30/12 1300  Gross per 24 hour  Intake      0 ml  Output    350 ml  Net   -350 ml  BM 8/20  Weight Status:  64.4 kg - stable  Estimated needs:  1700 - 1800 kcal, 90 - 115 grams protein  Nutrition Dx:  Inadequate oral intake r/t inability to eat AEB NPO status.  Goal:  Pt to meet >/= 90% of their estimated nutrition needs  Monitor:  weights, labs, TF tolerance, I/O's, diet advancement vs PEG  Jarold Motto MS, RD, LDN Pager: [phone] After-hours pager: [phone]

## 2012-05-01 NOTE — Progress Notes (Signed)
Subjective/Complaints: Feels good. Denies pain. Excited to have swallowing test today. A 12 point review of systems has been performed and if not noted above is otherwise negative.   Objective: Vital Signs: Blood pressure 116/55, pulse 64, temperature 98.2 F (36.8 C), temperature source Oral, resp. rate 19, weight 64.365 kg (141 lb 14.4 oz), SpO2 100.00%. Dg Chest 2 View  05/01/2012  *RADIOLOGY REPORT*  Clinical Data: Assess for interval change after NG placement.  CHEST - 2 VIEW  Comparison: 04/26/2012  Findings: No NG tube visualized.  Bilateral interstitial and nodular airspace opacities.  No pleural effusion or pneumothorax. No acute osseous finding.  Unchanged cardiomediastinal contours.  IMPRESSION: No NG tube visualized.  Bilateral interstitial and nodular airspace opacities, again favored to reflect multifocal infection versus edema.  Recommend clinical correlation and continued follow up to document resolution/exclude underlying lesions.   Original Report Authenticated By: Waneta Martins, M.D.    Dg Abd 1 View  04/30/2012  *RADIOLOGY REPORT*  Clinical Data: NG tube placement.  ABDOMEN - 1 VIEW  Comparison: Plain film the abdomen 04/30/2012 at 1630 hours.  Findings: NG tube is in place with the side port in the stomach in good position.  Contrast material throughout the colon again noted. Bilateral hip degenerative change is worse on the left.  IMPRESSION: NG tube in good position.  No other change.   Original Report Authenticated By: Bernadene Bell. D'ALESSIO, M.D.    Dg Abd 1 View  04/30/2012  *RADIOLOGY REPORT*  Clinical Data: NG tube placement.  ABDOMEN - 1 VIEW  Comparison: 04/26/2012  Findings: The tip of the NG tube is at the level of the carina, probably in the esophagus rather than in the trachea since it appears to be lateral to the trachea at the thoracic inlet. However, I advised the patient's nurse it could possibly be in the airway.  The bilateral infiltrates have improved  particularly at the left base.  No effusions.  Heart size and pulmonary vascularity are normal.  Extensive contrast is seen in the colon, probably secondary to the patient's recent swallowing function study.  IMPRESSION: NG tube tip is at the level of the carina and needs be removed and repositioned.  Significant improvement in the bilateral infiltrates.  Critical Value/emergent results were called by telephone at the time of interpretation on 04/30/2012 at 4:50 p.m. to the patient's nurse, Scheryl Marten, who verbally acknowledged these results.   Original Report Authenticated By: Gwynn Burly, M.D.    Dg Abd Portable 1v  05/01/2012  *RADIOLOGY REPORT*  Clinical Data: NG tube placement.  PORTABLE ABDOMEN - 1 VIEW  Comparison: Single view of the abdomen earlier this same date.  Findings: NG tube is identified with the tip deep in the right lung.  IMPRESSION: As above.   Original Report Authenticated By: Bernadene Bell. D'ALESSIO, M.D.    Dg Swallowing Func-no Report  04/30/2012  CLINICAL DATA: dysphagia   FLUOROSCOPY FOR SWALLOWING FUNCTION STUDY:  Fluoroscopy was provided for swallowing function study, which was  administered by a speech pathologist.  Final results and recommendations  from this study are contained within the speech pathology report.      Basename 05/01/12 0539 04/30/12 0908  WBC 27.1* 26.5*  HGB 8.4* 7.7*  HCT 26.5* 24.5*  PLT 471* 511*    Basename 05/01/12 0539 04/29/12 0632  NA 133* 131*  K 4.7 4.7  CL 94* 94*  CO2 33* 32  GLUCOSE 230* 182*  BUN 27* 26*  CREATININE 0.70 0.68  CALCIUM 8.3* 7.9*   CBG (last 3)   Basename 05/01/12 0710 05/01/12 0413 05/01/12 0013  GLUCAP 221* 219* 265*    Wt Readings from Last 3 Encounters:  05/01/12 64.365 kg (141 lb 14.4 oz)  04/26/12 65 kg (143 lb 4.8 oz)  04/26/12 65 kg (143 lb 4.8 oz)    Physical Exam:  HENT:  Head: Normocephalic.  NGT in place. Missing many teeth. Mucosa pink but with white film over tongue and pharynx. Phonation  better Eyes:  Pupils round and reactive to light  Neck: Neck supple. No thyromegaly present.  Cardiovascular: Regular rhythm. Normal rate Pulmonary/Chest: Breath sounds normal. No respiratory distress. He has no wheezes. Occasional upper airway sounds heard as well as rhonchi Abdominal: Bowel sounds are normal. He exhibits no distension.  Neurological: He is alert.  Patient is a poor historian. He was able to give his date of birth and age. He could name the hospital with multiple cues. He would follow simple commands. Poor insight and awareness. He had difficulty controlling his secretions and a weak cough. Strength was symmetrical but he was weak more proximally than distally (2-3 prox to 4 distally). No focal sensory loss was discerned.  Skin:  Skin: 4cm lateral mallelolus wound with fibronecrotic tissue. Buttock areas with fibronecrotic tissue also (x 2) about 1-2cm in diameter Psychiatric:  Patient with improved awareness and insight   Assessment/Plan: 1. Functional deficits secondary to sepsis/deconditioning which require 3+ hours per day of interdisciplinary therapy in a comprehensive inpatient rehab setting. Physiatrist is providing close team supervision and 24 hour management of active medical problems listed below. Physiatrist and rehab team continue to assess barriers to discharge/monitor patient progress toward functional and medical goals. FIM: FIM - Bathing Bathing Steps Patient Completed: Chest;Right Arm;Left Arm;Abdomen;Front perineal area;Buttocks Bathing: 3: Mod-Patient completes 5-7 75f 10 parts or 50-74%  FIM - Upper Body Dressing/Undressing Upper body dressing/undressing steps patient completed: Thread/unthread right sleeve of pullover shirt/dresss;Thread/unthread left sleeve of pullover shirt/dress Upper body dressing/undressing: 3: Mod-Patient completed 50-74% of tasks FIM - Lower Body Dressing/Undressing Lower body dressing/undressing: 1: Total-Patient completed  less than 25% of tasks  FIM - Toileting Toileting: 0: Activity did not occur  FIM - Diplomatic Services operational officer Devices: Grab bars;Elevated toilet seat Toilet Transfers: 0-Activity did not occur  FIM - Banker Devices: Therapist, occupational: 4: Supine > Sit: Min A (steadying Pt. > 75%/lift 1 leg);4: Bed > Chair or W/C: Min A (steadying Pt. > 75%)  FIM - Locomotion: Wheelchair Distance: 100' Locomotion: Wheelchair: 2: Travels 50 - 149 ft with supervision, cueing or coaxing FIM - Locomotion: Ambulation Locomotion: Ambulation Assistive Devices: Designer, industrial/product Ambulation/Gait Assistance: 3: Mod assist  Comprehension Comprehension Mode: Auditory Comprehension: 5-Follows basic conversation/direction: With no assist  Expression Expression Mode: Verbal Expression: 5-Expresses basic needs/ideas: With no assist  Social Interaction Social Interaction: 5-Interacts appropriately 90% of the time - Needs monitoring or encouragement for participation or interaction.  Problem Solving Problem Solving: 4-Solves basic 75 - 89% of the time/requires cueing 10 - 24% of the time  Memory Memory: 3-Recognizes or recalls 50 - 74% of the time/requires cueing 25 - 49% of the time Medical Problem List and Plan:  1. deconditioning related to sepsis multiple medical complications/metabolic encephalopathy/respiratory failure. Will discuss with critical care medicine on weaning of prednisone  2. DVT Prophylaxis/Anticoagulation: Subcutaneous heparin. Monitor platelet counts and any signs of bleeding  3. Pain Management: Lortab as needed. Monitor with increased mobility  4. Neuropsych: This patient is not capable of making decisions on his/her own behalf.  5. Dysphagia. Continue nasogastric tubes for now for nutritional support. Will replace today  -phonation is better and throat is less painful  -repeat MBS next week  -needs wrist restraints to  keep from pulling ngt 6.NSTEMI. Followup per cardiology services. Continue aspirin therapy as well as Lopressor 12.5 mg twice a day (hold today). No current chest pain or shortness of breath  7. Esophageal candidiasis. Diflucan x7 days. Sx improving 8. Diabetes mellitus with peripheral neuropathy. Hemoglobin A1c 10.5. Lantus insulin 15 units at bedtime. added am lantus as well to better cover pm cbgs.  Check blood sugars a.c. and at bedtime   -continue to titrate further today 9. Right lateral malleolus on left lower buttocks decubitus ulcers. Santyl for right lateral malleolus breakdown 10. Anemia. Latest hemoglobin 7.8. Monitor for a bleeding episodes and followup CBC with plan to transfuse if symptomatic  11. Hypothyroidism. Synthroid  12. Medical noncompliance. Will discuss at length with patient and family the need to maintain medical regimen 13. Leukocytosis: wbc's continue to rise (27k today).   -ua suspicious for UTI. cipro intiated  -will recheck cxr today  -afebrile.    -potential steroid effect as well.   LOS (Days) 5 A FACE TO FACE EVALUATION WAS PERFORMED  SWARTZ,ZACHARY T 05/01/2012, 8:26 AM

## 2012-05-01 NOTE — Progress Notes (Signed)
Pt pulled his NG tube out. Pam Love PA was notified and I received a phone order with read back to inserted a new NG tube and applied a soft restraint to his wrist.   After 2 unsuccesful attempt( by Edwena Felty and I) confirmed with portable X Sherald Barge  Love  PA was notified.   I received  Stat order for chest X ray 2 view. Patient vital was in normal range as shown in the doc flowsheet. Restraint was never initiated because NG tube has not been place. Pam Love Pa stated to let the patient  rest for the night. All night meds was not given.

## 2012-05-01 NOTE — Progress Notes (Signed)
Physical Therapy Note  Patient Details  Name: Javarian Jakubiak MRN: [id number] Date of Birth: 1929/07/30 Today's Date: 05/01/2012  1120-1200 (40 minutes) individual (missed 20 minutes nursing issues)  Pain - no complaint of pain Other; Pt using 2 L Oxygen [place] Focus of treatment; gait training/endurance; bilateral LE strengthening/activity tolerance Treatment: Transfers - stand/pivot RW min to close supervision from wc; sit to stand 2 X 5 from edge of mat ; Nustep Level 3 (2 X 5 minutes) ; Gait with RW min assist 10 feet / 20 feet . Pt tolerated treatment without difficulties.  1530 -1555  (25 minutes) individual Pain: no complaint of pain Focus of treatment: bilateral LE strengthening in supine Treatment: transfers stand/pivot min assist; sit to supine mod assist (bilateral LEs) ; bilateral heel slides X 15, manual resistive leg presses X 15, hip abduction X 10, AA SLRs X 10. Tirsa Gail,JIM 05/01/2012, 11:44 AM

## 2012-05-01 NOTE — Progress Notes (Signed)
Speech Language Pathology Daily Session Note  Patient Details  Name: Dorsie Burich MRN: [id number] Date of Birth: [birth year]  Today's Date: 05/01/2012 Time: 4098-1191 Time Calculation (min): 40 min  Short Term Goals: Week 1: SLP Short Term Goal 1 (Week 1): Pt will consume trials of thin liquids via spoon without overt s/s of aspiration with 75% of trials SLP Short Term Goal 2 (Week 1): Pt will consume trials of puree textures without overt s/s of aspiration with 75% of trials. SLP Short Term Goal 3 (Week 1): Pt will recall new, daily information with supervision verbal and visual cues.   Skilled Therapeutic Interventions: Unable to arouse pt to participate in swallowing treatment, however, pt's daughter present with treatment focus on education. Pt's daughter educated on pt's current swallowing function, goals of swallowing therapy and anticipated timeline for next objective swallow study. Pt's daughter verbalized understanding.    FIM:  FIM - Eating Eating Activity: 1: Helper performs IV, parenteral, or tube feeding  Pain Unable to asess  Therapy/Group: Individual Therapy  Aisia Correira 05/01/2012, 4:22 PM

## 2012-05-01 NOTE — Progress Notes (Signed)
Occupational Therapy Session Note  Patient Details  Name: [name] MRN: [id number] Date of Birth: Apr 01, 1929  Today's Date: 05/01/2012 Time: 4098-1191 Time Calculation (min): 55 min  Short Term Goals: Week 1:  OT Short Term Goal 1 (Week 1): Pt will tolerate 3 mins standing during grooming activities before needing to rest. OT Short Term Goal 2 (Week 1): Pt will perform LB bathing and dressing with min assist sit to stand with AE PRN. OT Short Term Goal 3 (Week 1): Pt will perform toilet transfers with min assist using RW to 3:1 OT Short Term Goal 4 (Week 1): Pt will perform BUE light therex with suprevision to increase overall endurance for basic selfcare tasks and functional transfers.  Skilled Therapeutic Interventions/Progress Updates:  Patient found supine in bed with no complaints of pain. Patient engaged in bed mobility for edge of bed -> w/c stand pivot transfer. Patient then performed UB/LB bathing & dressing and grooming tasks at sink in sit->stand position. Focused skilled intervention on functional use of bilateral UEs, overall activity tolerance/endurance, ADL retraining, and dynamic standing balance/tolerance/endurance. At end of session left patient seated in w/c with quick release belt donned and call bell & phone within reach. Daughter present at end of session.   Precautions:  Precautions Precautions: Fall Precaution Comments: NG tube, multiple decubitus lcers to sacrum and lower legs Required Braces or Orthoses: Other Brace/Splint Other Brace/Splint: pressure relieving heel protectors when in bed Restrictions Weight Bearing Restrictions: No  See FIM for current functional status  Therapy/Group: Individual Therapy  [name] 05/01/2012, 9:30 AM

## 2012-05-01 NOTE — Progress Notes (Signed)
Nursing Note: Placed nasogastric tube to left nares after unable to place in right nares.Tube placed per policy. Pt tolerated fair.Unable to verify placement via auscultation.Paged radiogy for placement verification. R: tube placement in right lung per x-ray.Tube promptly removed.Paged Pam Love,PA the patient not in any distress.T-98.4 p-74 r-18 BP- 138/80 pO2 94% on 2L nasal cannula. Pt resting quietly with no sx of distress. Tube removed at 0033. Pam love ,Pa notified and tube to be left out till the am and chest X-ay pa and lat ordered.Lungs clear to auscultation.[facility] notified.

## 2012-05-01 NOTE — Progress Notes (Signed)
Nursing Note: Pt resting quietly.Head of bed elevated.wbb

## 2012-05-01 NOTE — Progress Notes (Signed)
Pt wife was notified at 6 am. [name] ( wife) house phone was unavailable. Also cell phone was also unavailable. Esmond Camper (daughter) was called and she will reported the information to [facility].

## 2012-05-02 ENCOUNTER — Inpatient Hospital Stay (HOSPITAL_COMMUNITY): Payer: Medicare Other

## 2012-05-02 ENCOUNTER — Inpatient Hospital Stay (HOSPITAL_COMMUNITY): Payer: Medicare Other | Admitting: Speech Pathology

## 2012-05-02 ENCOUNTER — Inpatient Hospital Stay (HOSPITAL_COMMUNITY): Payer: Medicare Other | Admitting: Physical Therapy

## 2012-05-02 LAB — URINE CULTURE: Colony Count: >=100000

## 2012-05-02 LAB — CBC
HCT: 24 % — ABNORMAL LOW (ref 39.0–52.0)
Hemoglobin: 7.7 g/dL — ABNORMAL LOW (ref 13.0–17.0)
MCH: 32.5 pg (ref 26.0–34.0)
MCHC: 32.1 g/dL (ref 30.0–36.0)
MCV: 101.3 fL — ABNORMAL HIGH (ref 78.0–100.0)
Platelets: 497 K/uL — ABNORMAL HIGH (ref 150–400)
RBC: 2.37 MIL/uL — ABNORMAL LOW (ref 4.22–5.81)
RDW: 15.4 % (ref 11.5–15.5)
WBC: 31.6 K/uL — ABNORMAL HIGH (ref 4.0–10.5)

## 2012-05-02 LAB — COMPREHENSIVE METABOLIC PANEL
ALT: 22 U/L (ref 0–53)
AST: 22 U/L (ref 0–37)
Calcium: 8.7 mg/dL (ref 8.4–10.5)
Sodium: 129 mEq/L — ABNORMAL LOW (ref 135–145)
Total Protein: 6 g/dL (ref 6.0–8.3)

## 2012-05-02 LAB — GLUCOSE, CAPILLARY
Glucose-Capillary: 175 mg/dL — ABNORMAL HIGH (ref 70–99)
Glucose-Capillary: 183 mg/dL — ABNORMAL HIGH (ref 70–99)
Glucose-Capillary: 188 mg/dL — ABNORMAL HIGH (ref 70–99)
Glucose-Capillary: 196 mg/dL — ABNORMAL HIGH (ref 70–99)
Glucose-Capillary: 89 mg/dL (ref 70–99)

## 2012-05-02 LAB — TSH: TSH: 5.26 u[IU]/mL — ABNORMAL HIGH (ref 0.350–4.500)

## 2012-05-02 MED ORDER — MOXIFLOXACIN HCL IN NACL 400 MG/250ML IV SOLN
400.0000 mg | INTRAVENOUS | Status: DC
  Administered 2012-05-02: 400 mg via INTRAVENOUS
  Filled 2012-05-02 (×2): qty 250

## 2012-05-02 MED ORDER — MOXIFLOXACIN HCL 400 MG PO TABS: 400.0000 mg | ORAL_TABLET | Freq: Every day | ORAL | Status: DC

## 2012-05-02 NOTE — Progress Notes (Signed)
Subjective/Complaints: Feels good. Denies pain. "i'm a side sleeper" A 12 point review of systems has been performed and if not noted above is otherwise negative.   Objective: Vital Signs: Blood pressure 122/53, pulse 68, temperature 98.7 F (37.1 C), temperature source Oral, resp. rate 17, weight 64.365 kg (141 lb 14.4 oz), SpO2 100.00%. Dg Chest 2 View  05/01/2012  *RADIOLOGY REPORT*  Clinical Data: Assess for interval change after NG placement.  CHEST - 2 VIEW  Comparison: 04/26/2012  Findings: No NG tube visualized.  Bilateral interstitial and nodular airspace opacities.  No pleural effusion or pneumothorax. No acute osseous finding.  Unchanged cardiomediastinal contours.  IMPRESSION: No NG tube visualized.  Bilateral interstitial and nodular airspace opacities, again favored to reflect multifocal infection versus edema.  Recommend clinical correlation and continued follow up to document resolution/exclude underlying lesions.   Original Report Authenticated By: Waneta Martins, M.D.    Dg Abd 1 View  04/30/2012  *RADIOLOGY REPORT*  Clinical Data: NG tube placement.  ABDOMEN - 1 VIEW  Comparison: Plain film the abdomen 04/30/2012 at 1630 hours.  Findings: NG tube is in place with the side port in the stomach in good position.  Contrast material throughout the colon again noted. Bilateral hip degenerative change is worse on the left.  IMPRESSION: NG tube in good position.  No other change.   Original Report Authenticated By: Bernadene Bell. D'ALESSIO, M.D.    Dg Abd 1 View  04/30/2012  *RADIOLOGY REPORT*  Clinical Data: NG tube placement.  ABDOMEN - 1 VIEW  Comparison: 04/26/2012  Findings: The tip of the NG tube is at the level of the carina, probably in the esophagus rather than in the trachea since it appears to be lateral to the trachea at the thoracic inlet. However, I advised the patient's nurse it could possibly be in the airway.  The bilateral infiltrates have improved particularly at the left  base.  No effusions.  Heart size and pulmonary vascularity are normal.  Extensive contrast is seen in the colon, probably secondary to the patient's recent swallowing function study.  IMPRESSION: NG tube tip is at the level of the carina and needs be removed and repositioned.  Significant improvement in the bilateral infiltrates.  Critical Value/emergent results were called by telephone at the time of interpretation on 04/30/2012 at 4:50 p.m. to the patient's nurse, Scheryl Marten, who verbally acknowledged these results.   Original Report Authenticated By: Gwynn Burly, M.D.    Dg Abd Portable 1v  05/01/2012  *RADIOLOGY REPORT*  Clinical Data: Confirm tube placement  PORTABLE ABDOMEN - 1 VIEW  Comparison: 05/01/2012 at 1030 hrs  Findings: Enteric tube terminates in the antropyloric region.  Residual contrast in the colon.  Patchy bilateral interstitial/airspace opacities, right greater than left.  IMPRESSION: Enteric tube terminates in the antropyloric region.   Original Report Authenticated By: Charline Bills, M.D.    Dg Abd Portable 1v  05/01/2012  *RADIOLOGY REPORT*  Clinical Data: NG tube placement  PORTABLE ABDOMEN - 1 VIEW  Comparison: Plain film 05/01/2012 and zero 21 hours  Findings: Repositioning of the nasogastric tube such that tip is now within the gastric body.  The side port is above the GE junction.  Consider advancement by 5 cm.  IMPRESSION:  1.  NG tube within stomach. 2.  Consider advancing tube 5 cm to bring side port below the GE junction.   Original Report Authenticated By: Genevive Bi, M.D.    Dg Abd Portable 1v  05/01/2012  *  RADIOLOGY REPORT*  Clinical Data: NG tube placement.  PORTABLE ABDOMEN - 1 VIEW  Comparison: Single view of the abdomen earlier this same date.  Findings: NG tube is identified with the tip deep in the right lung.  IMPRESSION: As above.   Original Report Authenticated By: Bernadene Bell. D'ALESSIO, M.D.    Dg Swallowing Func-no Report  04/30/2012  CLINICAL DATA:  dysphagia   FLUOROSCOPY FOR SWALLOWING FUNCTION STUDY:  Fluoroscopy was provided for swallowing function study, which was  administered by a speech pathologist.  Final results and recommendations  from this study are contained within the speech pathology report.      Basename 05/02/12 0650 05/01/12 0539  WBC 31.6* 27.1*  HGB 7.7* 8.4*  HCT 24.0* 26.5*  PLT 497* 471*    Basename 05/01/12 0539  NA 133*  K 4.7  CL 94*  CO2 33*  GLUCOSE 230*  BUN 27*  CREATININE 0.70  CALCIUM 8.3*   CBG (last 3)   Basename 05/02/12 0727 05/02/12 0441 05/02/12 0009  GLUCAP 175* 196* 183*    Wt Readings from Last 3 Encounters:  05/01/12 64.365 kg (141 lb 14.4 oz)  04/26/12 65 kg (143 lb 4.8 oz)  04/26/12 65 kg (143 lb 4.8 oz)    Physical Exam:  HENT:  Head: Normocephalic.  NGT in place but appears to be pulled out 8 inches or so. Missing many teeth. Mucosa pink but with white film over tongue and pharynx. Phonation better Eyes:  Pupils round and reactive to light  Neck: Neck supple. No thyromegaly present.  Cardiovascular: Regular rhythm. Normal rate Pulmonary/Chest: Breath sounds normal. No respiratory distress. He has no wheezes. Occasional upper airway sounds heard as well as rhonchi Abdominal: Bowel sounds are normal. He exhibits no distension.  Neurological: He is alert.  Patient is a poor historian. He was able to give his date of birth and age.   He would follow simple commands. limited insight and awareness. He had difficulty controlling his secretions and a weak cough. Strength was symmetrical but he was weak more proximally than distally (2-3 prox to 4 distally). No focal sensory loss was discerned.  Skin:  Skin: 4cm lateral mallelolus wound with fibronecrotic tissue. Buttock areas with fibronecrotic tissue also (x 2) about 1-2cm in diameter Psychiatric:  Patient with improved awareness and insight   Assessment/Plan: 1. Functional deficits secondary to sepsis/deconditioning  which require 3+ hours per day of interdisciplinary therapy in a comprehensive inpatient rehab setting. Physiatrist is providing close team supervision and 24 hour management of active medical problems listed below. Physiatrist and rehab team continue to assess barriers to discharge/monitor patient progress toward functional and medical goals.  The patient requires restraints due to pulling out ngt  FIM: FIM - Bathing Bathing Steps Patient Completed: Chest;Right Arm;Left Arm;Abdomen;Front perineal area;Buttocks;Right upper leg;Left upper leg;Right lower leg (including foot);Left lower leg (including foot) Bathing: 4: Steadying assist  FIM - Upper Body Dressing/Undressing Upper body dressing/undressing steps patient completed: Thread/unthread right sleeve of pullover shirt/dresss;Thread/unthread left sleeve of pullover shirt/dress;Put head through opening of pull over shirt/dress;Pull shirt over trunk Upper body dressing/undressing: 5: Supervision: Safety issues/verbal cues FIM - Lower Body Dressing/Undressing Lower body dressing/undressing steps patient completed: Thread/unthread right pants leg;Thread/unthread left pants leg Lower body dressing/undressing: 2: Max-Patient completed 25-49% of tasks  FIM - Toileting Toileting: 0: Activity did not occur  FIM - Diplomatic Services operational officer Devices: Grab bars;Elevated toilet seat Toilet Transfers: 0-Activity did not occur  FIM - Games developer  Transfer Assistive Devices: Therapist, occupational: 5: Supine > Sit: Supervision (verbal cues/safety issues);4: Bed > Chair or W/C: Min A (steadying Pt. > 75%)  FIM - Locomotion: Wheelchair Distance: 100' Locomotion: Wheelchair: 2: Travels 50 - 149 ft with supervision, cueing or coaxing FIM - Locomotion: Ambulation Locomotion: Ambulation Assistive Devices: Designer, industrial/product Ambulation/Gait Assistance: 3: Mod assist  Comprehension Comprehension Mode:  Auditory Comprehension: 5-Follows basic conversation/direction: With extra time/assistive device  Expression Expression Mode: Verbal Expression: 5-Expresses basic needs/ideas: With extra time/assistive device  Social Interaction Social Interaction: 5-Interacts appropriately 90% of the time - Needs monitoring or encouragement for participation or interaction.  Problem Solving Problem Solving: 5-Solves basic 90% of the time/requires cueing < 10% of the time  Memory Memory: 5-Recognizes or recalls 90% of the time/requires cueing < 10% of the time Medical Problem List and Plan:  1. deconditioning related to sepsis multiple medical complications/metabolic encephalopathy/respiratory failure. Will discuss with critical care medicine on weaning of prednisone  2. DVT Prophylaxis/Anticoagulation: Subcutaneous heparin. Monitor platelet counts and any signs of bleeding  3. Pain Management: Lortab as needed. Monitor with increased mobility  4. Neuropsych: This patient is not capable of making decisions on his/her own behalf.  5. Dysphagia. Continue nasogastric tubes for now for nutritional support. Will replace today  -phonation is better and throat is less painful  -repeat MBS next week  -needs wrist restraints to keep from pulling ngt-tube is somewhat pulled out again 6.NSTEMI. Followup per cardiology services. Continue aspirin therapy as well as Lopressor 12.5 mg twice a day (hold today). No current chest pain or shortness of breath  7. Esophageal candidiasis. Diflucan x7 days. Sx improving 8. Diabetes mellitus with peripheral neuropathy. Hemoglobin A1c 10.5. Lantus insulin 15 units at bedtime. added am lantus as well to better cover pm cbgs.  Check blood sugars a.c. and at bedtime   -continue to titrate further today 9. Right lateral malleolus on left lower buttocks decubitus ulcers. Santyl for right lateral malleolus breakdown 10. Anemia. Latest hemoglobin 7.8. Monitor for a bleeding episodes  and followup CBC with plan to transfuse if symptomatic  11. Hypothyroidism. Synthroid  12. Medical noncompliance. Will discuss at length with patient and family the need to maintain medical regimen 13. Leukocytosis: wbc's continue to rise (27k today).   -cxr with multifocal conslidations which are likely infection vs edema.   -change abx to avelox  -re-check kub for tube placement  LOS (Days) 6 A FACE TO FACE EVALUATION WAS PERFORMED  Emanuela Runnion T 05/02/2012, 8:06 AM

## 2012-05-02 NOTE — Progress Notes (Addendum)
Physical Therapy Session Note  Patient Details  Name: [name] MRN: [id number] Date of Birth: 18-Dec-1928  Today's Date: 05/02/2012 Time: 7829-5621 Time Calculation (min): 30 min  Short Term Goals: Week 1:  PT Short Term Goal 1 (Week 1): Patient will be able to perform Bed mobility with Min-Assist PT Short Term Goal 2 (Week 1): Patient will be able to perform Transfers with Min-Assist PT Short Term Goal 3 (Week 1): Patient will be able to perform gait using RW x 30' with Min-Assist PT Short Term Goal 4 (Week 1): Patient will be able to ascend/descend 2 steps using B handrails with Min-Assist  Skilled Therapeutic Interventions/Progress Updates:    Pt declined to get OOB at start of treatment secondary to fatigue from having multiple tests/procedures today. Agreed to bil. LE strengthening in bed: ankle dorsiflexion/plantarflexion, heel slides, hip abduction/adduction, manually resisted hip/knee extension all performed 2 x 10 reps. Pt educated on not allowing heel to touch bed for skin integrity with all exercises. Pt encouraged to cough strongly throughout session although he has limited ability to do so.  Pt positioned with pillow under bil. LEs, heels suspended and body slightly rolled to Lt. for skin preservation. Pt educated on positioning and importance of pressure relief. RN in room and already aware of rolling program.   Therapy Documentation Precautions:  Precautions Precautions: Fall Precaution Comments: NG tube, multiple decubitus lcers to sacrum and lower legs Required Braces or Orthoses: Other Brace/Splint Other Brace/Splint: pressure relieving heel protectors when in bed Restrictions Weight Bearing Restrictions: No Pain: Pain Assessment Pain Assessment:  Reports unrated pain in nose secondary to repeated NG tube placements Pain Intervention(s): Repositioned;Other (Comment) (pt reports no need for medication)  See FIM for current functional status  Therapy/Group:  Individual Therapy  [name] 05/02/2012, 5:42 PM

## 2012-05-02 NOTE — Progress Notes (Signed)
Speech Language Pathology Daily Session Note  Patient Details  Name: [name] MRN: [id number] Date of Birth: [birth year]  Today's Date: 05/02/2012 Time: 4098-1191 Time Calculation (min): 45 min  Short Term Goals: Week 1: SLP Short Term Goal 1 (Week 1): Pt will consume trials of thin liquids via spoon without overt s/s of aspiration with 75% of trials SLP Short Term Goal 2 (Week 1): Pt will consume trials of puree textures without overt s/s of aspiration with 75% of trials. SLP Short Term Goal 3 (Week 1): Pt will recall new, daily information with supervision verbal and visual cues.   Skilled Therapeutic Interventions: Pt administered oral care. Treatment focus on trials of thin liquids and puree textures. Pt without overt s/s of aspiration with thin liquids via teaspoon or pureed textures, however, pt reported feeling pharyngeal residue with puree textures that cleared with multiple swallows. Pt with delayed swallow initiation (~5 secs) with all trials. Treatment focus also on utilization of diaphragmatic breathing to increase breath support and cough strength.   FIM:  Comprehension Comprehension Mode: Auditory Comprehension: 5-Follows basic conversation/direction: With extra time/assistive device Expression Expression Mode: Verbal Expression: 5-Expresses basic 90% of the time/requires cueing < 10% of the time. Social Interaction Social Interaction: 5-Interacts appropriately 90% of the time - Needs monitoring or encouragement for participation or interaction. Problem Solving Problem Solving: 5-Solves basic 90% of the time/requires cueing < 10% of the time Memory Memory: 5-Recognizes or recalls 90% of the time/requires cueing < 10% of the time  Pain Pain Assessment Pain Assessment: No/denies pain  Therapy/Group: Individual Therapy  [name] 05/02/2012, 4:01 PM

## 2012-05-02 NOTE — Progress Notes (Signed)
Occupational Therapy Session Note  Patient Details  Name: [name] MRN: [id number] Date of Birth: 1929/06/08  Today's Date: 05/02/2012 Time: 0900-1000 Time Calculation (min): 60 min  Short Term Goals: Week 1:  OT Short Term Goal 1 (Week 1): Pt will tolerate 3 mins standing during grooming activities before needing to rest. OT Short Term Goal 2 (Week 1): Pt will perform LB bathing and dressing with min assist sit to stand with AE PRN. OT Short Term Goal 3 (Week 1): Pt will perform toilet transfers with min assist using RW to 3:1 OT Short Term Goal 4 (Week 1): Pt will perform BUE light therex with suprevision to increase overall endurance for basic selfcare tasks and functional transfers.  Skilled Therapeutic Interventions/Progress Updates:    Pt engaged in bathing and dressing tasks with sit<>stand from w/c at sink.  Pt performed sit<>stand and stand pivot transfer with min A.  Pt incontinent of bowel and stood at sink for approx 3 mins with BUE support for RN to cleanse and apply new bandages to buttocks.  Pt required assistance to thread pants over feet but was able to pull up pants while standing.  Pt continues to required multiple rest breaks and extra time to complete tasks.  Pt currently is on 2L O2 and O2 levels remained above 90% throughout session.  Focus on activity tolerance and standing balance.  Pt remained in w/c at bedside with bilateral wrist restraints in place and call bell accessible in lap.  Therapy Documentation Precautions:  Precautions Precautions: Fall Precaution Comments: NG tube, multiple decubitus lcers to sacrum and lower legs Required Braces or Orthoses: Other Brace/Splint Other Brace/Splint: pressure relieving heel protectors when in bed Restrictions Weight Bearing Restrictions: No  Pain: Pain Assessment Pain Assessment: No/denies pain    See FIM for current functional status  Therapy/Group: Individual Therapy  [name] 05/02/2012,  9:56 AM

## 2012-05-02 NOTE — Progress Notes (Signed)
Physical Therapy Session Note  Patient Details  Name: [name] MRN: [id number] Date of Birth: 1929/04/02  Today's Date: 05/02/2012 Time: 1001-1058 Time Calculation (min): 57 min  Short Term Goals: Week 1:  PT Short Term Goal 1 (Week 1): Patient will be able to perform Bed mobility with Min-Assist PT Short Term Goal 2 (Week 1): Patient will be able to perform Transfers with Min-Assist PT Short Term Goal 3 (Week 1): Patient will be able to perform gait using RW x 30' with Min-Assist PT Short Term Goal 4 (Week 1): Patient will be able to ascend/descend 2 steps using B handrails with Min-Assist  Skilled Therapeutic Interventions/Progress Updates:    Pt does not remember NG tub being pulled out during stay. Pt able to verbalize need to call for assist when desires to return to bed. Standing dynamic balance activities with forward foot taps with and without UE support working single limb stance and standing activity tolerance. Gait training 2 x 30' with min-guard assist. SpO2 dropped to 80%, HR = 110 at end of ambulation on 2L [place] O2, SpO2 increased to 95% with seated rest. RN made aware,(? Accuracy of read). Seated long arc quads for strengthening 3 x 12 reps. Sit <> stands performed with min-guard/supervision throughout session.   Pt with decreased motivation, frequent requests to take nap during session however is slowly improving with endurance.  Therapy Documentation Precautions:  Precautions Precautions: Fall Precaution Comments: NG tube, multiple decubitus lcers to sacrum and lower legs Required Braces or Orthoses: Other Brace/Splint Other Brace/Splint: pressure relieving heel protectors when in bed Restrictions Weight Bearing Restrictions: No Pain: Pain Assessment Pain Assessment: No/denies pain  See FIM for current functional status  Therapy/Group: Individual Therapy  [name] 05/02/2012, 12:30 PM

## 2012-05-03 ENCOUNTER — Inpatient Hospital Stay (HOSPITAL_COMMUNITY): Payer: Medicare Other | Admitting: Physical Therapy

## 2012-05-03 ENCOUNTER — Inpatient Hospital Stay (HOSPITAL_COMMUNITY): Payer: Medicare Other

## 2012-05-03 ENCOUNTER — Inpatient Hospital Stay (HOSPITAL_COMMUNITY): Payer: Medicare Other | Admitting: Speech Pathology

## 2012-05-03 DIAGNOSIS — M86179 Other acute osteomyelitis, unspecified ankle and foot: Secondary | ICD-10-CM

## 2012-05-03 DIAGNOSIS — R7881 Bacteremia: Secondary | ICD-10-CM

## 2012-05-03 LAB — CBC WITH DIFFERENTIAL/PLATELET
Basophils Absolute: 0 10*3/uL (ref 0.0–0.1)
Basophils Relative: 0 % (ref 0–1)
Basophils Relative: 0 % (ref 0–1)
Eosinophils Absolute: 0 10*3/uL (ref 0.0–0.7)
Eosinophils Relative: 0 % (ref 0–5)
Eosinophils Relative: 0 % (ref 0–5)
Hemoglobin: 8.3 g/dL — ABNORMAL LOW (ref 13.0–17.0)
Lymphocytes Relative: 4 % — ABNORMAL LOW (ref 12–46)
Lymphocytes Relative: 2 % — ABNORMAL LOW (ref 12–46)
MCH: 32.3 pg (ref 26.0–34.0)
MCV: 99.6 fL (ref 78.0–100.0)
Monocytes Relative: 3 % (ref 3–12)
Neutrophils Relative %: 93 % — ABNORMAL HIGH (ref 43–77)
Platelets: 495 10*3/uL — ABNORMAL HIGH (ref 150–400)
RBC: 2.55 MIL/uL — ABNORMAL LOW (ref 4.22–5.81)
RDW: 15.4 % (ref 11.5–15.5)
WBC: 42.3 10*3/uL — ABNORMAL HIGH (ref 4.0–10.5)

## 2012-05-03 LAB — COMPREHENSIVE METABOLIC PANEL
AST: 18 U/L (ref 0–37)
Albumin: 1.4 g/dL — ABNORMAL LOW (ref 3.5–5.2)
Alkaline Phosphatase: 123 U/L — ABNORMAL HIGH (ref 39–117)
Chloride: 90 mEq/L — ABNORMAL LOW (ref 96–112)
Creatinine, Ser: 0.74 mg/dL (ref 0.50–1.35)
Potassium: 4.6 mEq/L (ref 3.5–5.1)
Sodium: 129 mEq/L — ABNORMAL LOW (ref 135–145)
Total Bilirubin: 0.1 mg/dL — ABNORMAL LOW (ref 0.3–1.2)

## 2012-05-03 LAB — GLUCOSE, CAPILLARY
Glucose-Capillary: 112 mg/dL — ABNORMAL HIGH (ref 70–99)
Glucose-Capillary: 125 mg/dL — ABNORMAL HIGH (ref 70–99)
Glucose-Capillary: 167 mg/dL — ABNORMAL HIGH (ref 70–99)

## 2012-05-03 NOTE — Progress Notes (Signed)
Physical Therapy Session Note  Patient Details  Name: Shine Mikes MRN: [id number] Date of Birth: 03/16/1929  Today's Date: 05/03/2012 Time: 7829-5621 Time Calculation (min): 25 min  Short Term Goals: Week 2:  PT Short Term Goal 1 (Week 2): Pt will perform stand pivot transfers with supervision PT Short Term Goal 2 (Week 2): Pt will tolerate standing activity for 3 min continuous.  PT Short Term Goal 3 (Week 2): Pt will verbalize importance of pressure relief and unweighting heels in bed for skin preservation.  Skilled Therapeutic Interventions/Progress Updates:    Pt again sleepy but willing to participate. Bed mobility performed with min assist, encouragement for independence. Gait training 2 x 30' with RW, up to min assist. Extended rest breaks needed throughout sessions. Sit <> stands performed with min assist, verbal cues for pre-stand positioning and anterior trunk translation. Pt placed in recliner at end of treatment, encouraged to stay OOB as much as possible. Educated pt on pressure relief and need to keep heels un-weighted to promote wound healing.   SpO2 >96% throughout treatment on 2 L [place] O2.   Therapy Documentation Precautions:  Precautions Precautions: Fall Precaution Comments: NG tube, multiple decubitus lcers to sacrum and lower legs Required Braces or Orthoses: Other Brace/Splint Other Brace/Splint: pressure relieving heel protectors when in bed Restrictions Weight Bearing Restrictions: No Pain: Pain Assessment Pain Assessment: No/denies pain  See FIM for current functional status  Therapy/Group: Individual Therapy  [name] 05/03/2012, 5:29 PM

## 2012-05-03 NOTE — Progress Notes (Signed)
Occupational Therapy Session Note  Patient Details  Name: [name] MRN: [id number] Date of Birth: 07-15-1929  Today's Date: 05/03/2012 Time: 4098-1191 Time Calculation (min): 40 min  Short Term Goals: Week 1:  OT Short Term Goal 1 (Week 1): Pt will tolerate 3 mins standing during grooming activities before needing to rest. OT Short Term Goal 1 - Progress (Week 1): Met OT Short Term Goal 2 (Week 1): Pt will perform LB bathing and dressing with min assist sit to stand with AE PRN. OT Short Term Goal 2 - Progress (Week 1): Progressing toward goal OT Short Term Goal 3 (Week 1): Pt will perform toilet transfers with min assist using RW to 3:1 OT Short Term Goal 3 - Progress (Week 1): Progressing toward goal OT Short Term Goal 4 (Week 1): Pt will perform BUE light therex with suprevision to increase overall endurance for basic selfcare tasks and functional transfers. OT Short Term Goal 4 - Progress (Week 1): Met  Skilled Therapeutic Interventions/Progress Updates:    Pt returned from Radiology in bed and stated that he didn't want to get out of bed.  Pt agreed to sitting EOB and standing at bedside to bathe perineal area and allow nursing to examine wounds on buttocks.  Pt stood approx 3.5 mins with steady assist.  Pt requested to lay back down after sitting EOB approx 10 mins to engage in grooming activities.  Pt required assistance with raising LLE into bed.  Pt's wrist placed in bilateral wrist restraints and call bell within in reach at end of session  Therapy Documentation Precautions:  Precautions Precautions: Fall Precaution Comments: NG tube, multiple decubitus lcers to sacrum and lower legs Required Braces or Orthoses: Other Brace/Splint Other Brace/Splint: pressure relieving heel protectors when in bed Restrictions Weight Bearing Restrictions: No General: General Amount of Missed OT Time (min): 25 Minutes    Pain: Pain Assessment Pain Assessment: No/denies pain  See FIM  for current functional status  Therapy/Group: Individual Therapy  [name] 05/03/2012, 11:54 AM

## 2012-05-03 NOTE — Progress Notes (Addendum)
Subjective/Complaints: No problems last night. Slept well. No shortness of breath, only occasional cough. Denies pain. A 12 point review of systems has been performed and if not noted above is otherwise negative.   Objective: Vital Signs: Blood pressure 115/57, pulse 71, temperature 98.3 F (36.8 C), temperature source Oral, resp. rate 17, weight 64.365 kg (141 lb 14.4 oz), SpO2 100.00%. Dg Abd 1 View  05/02/2012  *RADIOLOGY REPORT*  Clinical Data: Bedside nasogastric tube placement.  ABDOMEN - 1 VIEW  Comparison: One-view abdomen x-ray earlier same date 1116 hours and dating back to 04/23/2012.  Findings: Nasogastric tube tip overlies the expected location of the gastric antrum or duodenal bulb.  Nonobstructive bowel gas pattern.  Residual contrast material/barium throughout the upper normal caliber colon from cecum to rectum; in review of prior examinations, it appears the contrast was administered on 04/23/2012.  IMPRESSION:  1.  Nasogastric tube tip overlies the gastric antrum or duodenal bulb. 2.  No acute abdominal abnormality. 3.  Residual contrast material/barium throughout upper normal caliber colon.  This was indeed administered 8 days ago, this would be consistent with colonic stasis.   Original Report Authenticated By: Arnell Sieving, M.D.    Dg Abd 1 View  05/02/2012  *RADIOLOGY REPORT*  Clinical Data: Check NG tube placement  ABDOMEN - 1 VIEW  Comparison: 05/01/2012  Findings: NG tube now terminates in the lower esophagus.  Nonobstructive bowel gas pattern.  Residual contrast within the colon.  Severe degenerative changes of the left hip.  IMPRESSION: NG tube now terminates in the lower esophagus.  These results will be called to the ordering clinician or representative by the Radiologist Assistant, and communication documented in the PACS Dashboard.   Original Report Authenticated By: Charline Bills, M.D.    Dg Abd Portable 1v  05/01/2012  *RADIOLOGY REPORT*  Clinical Data:  Confirm tube placement  PORTABLE ABDOMEN - 1 VIEW  Comparison: 05/01/2012 at 1030 hrs  Findings: Enteric tube terminates in the antropyloric region.  Residual contrast in the colon.  Patchy bilateral interstitial/airspace opacities, right greater than left.  IMPRESSION: Enteric tube terminates in the antropyloric region.   Original Report Authenticated By: Charline Bills, M.D.    Dg Abd Portable 1v  05/01/2012  *RADIOLOGY REPORT*  Clinical Data: NG tube placement  PORTABLE ABDOMEN - 1 VIEW  Comparison: Plain film 05/01/2012 and zero 21 hours  Findings: Repositioning of the nasogastric tube such that tip is now within the gastric body.  The side port is above the GE junction.  Consider advancement by 5 cm.  IMPRESSION:  1.  NG tube within stomach. 2.  Consider advancing tube 5 cm to bring side port below the GE junction.   Original Report Authenticated By: Genevive Bi, M.D.     Basename 05/02/12 0650 05/01/12 0539  WBC 31.6* 27.1*  HGB 7.7* 8.4*  HCT 24.0* 26.5*  PLT 497* 471*    Basename 05/02/12 0650 05/01/12 0539  NA 129* 133*  K 5.1 4.7  CL 91* 94*  CO2 30 33*  GLUCOSE 189* 230*  BUN 30* 27*  CREATININE 0.75 0.70  CALCIUM 8.7 8.3*   CBG (last 3)   Basename 05/03/12 0014 05/02/12 2024 05/02/12 1630  GLUCAP 125* 80 89    Wt Readings from Last 3 Encounters:  05/01/12 64.365 kg (141 lb 14.4 oz)  04/26/12 65 kg (143 lb 4.8 oz)  04/26/12 65 kg (143 lb 4.8 oz)    Physical Exam:  HENT:  Head: Normocephalic.  NGT  in place but appears to be pulled out 8 inches or so. Missing many teeth. Mucosa pink but with white film over tongue and pharynx. Phonation better Eyes:  Pupils round and reactive to light  Neck: Neck supple. No thyromegaly present.  Cardiovascular: Regular rhythm. Normal rate Pulmonary/Chest: Breath sounds normal. No respiratory distress. He has no wheezes. Occasional upper airway sounds heard as well as rhonchi Abdominal: Bowel sounds are normal. He exhibits no  distension.  Neurological: He is alert.  Patient is a poor historian. He was able to give his date of birth and age.   He would follow simple commands. limited insight and awareness. He had difficulty controlling his secretions and a weak cough. Strength was symmetrical but he was weak more proximally than distally (2-3 prox to 4 distally). No focal sensory loss was discerned.  Skin:  Skin: 4cm lateral mallelolus wound with fibronecrotic tissue which is sloughing. Buttock areas with fibronecrotic tissue also (x 2) about 1-2cm in diameter Psychiatric:  Patient with improved awareness and insight   Assessment/Plan: 1. Functional deficits secondary to sepsis/deconditioning which require 3+ hours per day of interdisciplinary therapy in a comprehensive inpatient rehab setting. Physiatrist is providing close team supervision and 24 hour management of active medical problems listed below. Physiatrist and rehab team continue to assess barriers to discharge/monitor patient progress toward functional and medical goals.  The patient requires restraints due to pulling out ngt  FIM: FIM - Bathing Bathing Steps Patient Completed: Chest;Right Arm;Left Arm;Abdomen;Front perineal area;Buttocks;Right upper leg;Left upper leg;Right lower leg (including foot);Left lower leg (including foot) Bathing: 4: Steadying assist  FIM - Upper Body Dressing/Undressing Upper body dressing/undressing steps patient completed: Thread/unthread right sleeve of pullover shirt/dresss;Thread/unthread left sleeve of pullover shirt/dress;Put head through opening of pull over shirt/dress;Pull shirt over trunk Upper body dressing/undressing: 5: Supervision: Safety issues/verbal cues FIM - Lower Body Dressing/Undressing Lower body dressing/undressing steps patient completed: Thread/unthread right pants leg;Pull pants up/down Lower body dressing/undressing: 2: Max-Patient completed 25-49% of tasks  FIM - Toileting Toileting: 0:  Activity did not occur  FIM - Diplomatic Services operational officer Devices: Grab bars;Elevated toilet seat Toilet Transfers: 0-Activity did not occur  FIM - Banker Devices: Therapist, occupational: 5: Supine > Sit: Supervision (verbal cues/safety issues);4: Bed > Chair or W/C: Min A (steadying Pt. > 75%)  FIM - Locomotion: Wheelchair Distance: 60' Locomotion: Wheelchair: 2: Travels 50 - 149 ft with supervision, cueing or coaxing FIM - Locomotion: Ambulation Locomotion: Ambulation Assistive Devices: Designer, industrial/product Ambulation/Gait Assistance: 4: Min guard Locomotion: Ambulation: 1: Travels less than 50 ft with minimal assistance (Pt.>75%)  Comprehension Comprehension Mode: Auditory Comprehension: 5-Follows basic conversation/direction: With extra time/assistive device  Expression Expression Mode: Verbal Expression: 5-Expresses basic 90% of the time/requires cueing < 10% of the time.  Social Interaction Social Interaction: 4-Interacts appropriately 75 - 89% of the time - Needs redirection for appropriate language or to initiate interaction.  Problem Solving Problem Solving: 5-Solves basic 90% of the time/requires cueing < 10% of the time  Memory Memory: 5-Recognizes or recalls 90% of the time/requires cueing < 10% of the time Medical Problem List and Plan:  1. deconditioning related to sepsis multiple medical complications/metabolic encephalopathy/respiratory failure. Will discuss with critical care medicine on weaning of prednisone  2. DVT Prophylaxis/Anticoagulation: Subcutaneous heparin. Monitor platelet counts and any signs of bleeding  3. Pain Management: Lortab as needed. Monitor with increased mobility  4. Neuropsych: This patient is not capable of making decisions on  his/her own behalf.  5. Dysphagia. Continue nasogastric tubes for now for nutritional support. Will replace today  -phonation is better and throat is less  painful  -repeat MBS next week  -needs wrist restraints to keep from pulling ngt-tube is somewhat pulled out again 6.NSTEMI. Followup per cardiology services. Continue aspirin therapy as well as Lopressor 12.5 mg twice a day (hold today). No current chest pain or shortness of breath  7. Esophageal candidiasis. Diflucan x7 days. Sx improving 8. Diabetes mellitus with peripheral neuropathy. Hemoglobin A1c 10.5. Lantus insulin 15 units at bedtime. added am lantus as well to better cover pm cbgs.  Check blood sugars a.c. and at bedtime   -continue to titrate further as indicated 9. Right lateral malleolus on left lower buttocks decubitus ulcers. Santyl for right lateral malleolus breakdown 10. Anemia. Latest hemoglobin 7.8. Monitor for a bleeding episodes and followup CBC with plan to transfuse if symptomatic  11. Hypothyroidism. Synthroid  12. Medical noncompliance. Will discuss at length with patient and family the need to maintain medical regimen 13. Leukocytosis: wbc's continue to rise. He has been on chronic prednisone   -cxr with multifocal conslidations which are likely infection vs edema.   -changed abx to avelox  -recheck labs today. He remains afebrile  -will continue diflucan via tube for now given that there is still visible thrush on tongue,mouth  -culture right lateral malleolus wound and check xrays of ankle also  -will ask for an ID consult 14. Hyponatremia:  -f/u labs today to determine plan.  -may need to back off free h2o a bit  LOS (Days) 7 A FACE TO FACE EVALUATION WAS PERFORMED  Ladye Macnaughton T 05/03/2012, 6:22 AM

## 2012-05-03 NOTE — Progress Notes (Signed)
Nutrition Follow-up  Intervention:   1. Resume Glucerna 1.2 at 75 ml/hr x 20 hours with 30 ml Prostat via tube daily as medically able, once tube placed and placement verified. 2. RD to continue to follow. Will make recommendations based on diet advancement vs PEG. 3. Education needs to be addressed closer to d/c.  Assessment:   TF resumed at  1400 on 8/28 after tube placement verified. Free water discontinued 2/2 hyponatremia.   RN reports that patient's feeding tube became inadvertently removed today during therapy.  Pt continues to work with SLP on swallowing.  Diet Order:  NPO TF: Glucerna 1.2 at 75 ml/hr x 20 hours. 30 ml Prostat via tube daily. This regimen will provide: 1900 kcal, 105 grams protein, 1208 ml free water. Free water discontinued.  Meds: Scheduled Meds:    . aspirin  81 mg Oral Daily  . collagenase   Topical Daily  . feeding supplement  30 mL Per Tube Daily  . ferrous sulfate  300 mg Per Tube TID  . heparin subcutaneous  5,000 Units Subcutaneous Q8H  . insulin aspart  0-20 Units Subcutaneous Q4H  . insulin glargine  15 Units Subcutaneous Daily  . insulin glargine  20 Units Subcutaneous QHS  . levothyroxine  75 mcg Per Tube QAC breakfast  . metoprolol tartrate  12.5 mg Per Tube BID  . predniSONE  60 mg Per Tube Q breakfast  . ranitidine  150 mg Oral BID  . DISCONTD: free water  200 mL Per Tube Q8H  . DISCONTD: moxifloxacin  400 mg Intravenous Q24H   Continuous Infusions:    . feeding supplement (GLUCERNA 1.2 CAL) 1,000 mL (05/01/12 1400)   PRN Meds:.albuterol, antiseptic oral rinse, HYDROcodone-acetaminophen, ipratropium, ondansetron (ZOFRAN) IV, ondansetron, sorbitol  Labs:  CMP     Component Value Date/Time   NA 129* 05/03/2012 0655   K 4.6 05/03/2012 0655   CL 90* 05/03/2012 0655   CO2 32 05/03/2012 0655   GLUCOSE 115* 05/03/2012 0655   BUN 25* 05/03/2012 0655   CREATININE 0.74 05/03/2012 0655   CALCIUM 8.4 05/03/2012 0655   PROT 5.8* 05/03/2012 0655    ALBUMIN 1.4* 05/03/2012 0655   AST 18 05/03/2012 0655   ALT 18 05/03/2012 0655   ALKPHOS 123* 05/03/2012 0655   BILITOT 0.1* 05/03/2012 0655   GFRNONAA 84* 05/03/2012 0655   GFRAA >90 05/03/2012 0655   Sodium  Date/Time Value Range Status  05/03/2012  6:55 AM 129* 135 - 145 mEq/L Final  05/02/2012  6:50 AM 129* 135 - 145 mEq/L Final  05/01/2012  5:39 AM 133* 135 - 145 mEq/L Final    Potassium  Date/Time Value Range Status  05/03/2012  6:55 AM 4.6  3.5 - 5.1 mEq/L Final  05/02/2012  6:50 AM 5.1  3.5 - 5.1 mEq/L Final  05/01/2012  5:39 AM 4.7  3.5 - 5.1 mEq/L Final   CBG (last 3)   Basename 05/03/12 1111 05/03/12 0720 05/03/12 0411  GLUCAP 161* 112* 114*   Lab Results  Component Value Date   HGBA1C 10.5* 04/06/2012    Intake/Output Summary (Last 24 hours) at 05/03/12 1206 Last data filed at 05/02/12 2232  Gross per 24 hour  Intake      0 ml  Output    700 ml  Net   -700 ml  BM 8/29  Weight Status:  64.4 kg - stable  Estimated needs:  1700 - 1800 kcal, 90 - 115 grams protein  Nutrition Dx:  Inadequate  oral intake r/t inability to eat AEB NPO status.  Goal:  Pt to meet >/= 90% of their estimated nutrition needs - unmet 2/2 inadvertent panda removal  Monitor:  weights, labs, TF tolerance, I/O's, diet advancement vs PEG  [name] Motto MS, RD, LDN Pager: [phone] After-hours pager: [phone]

## 2012-05-03 NOTE — Progress Notes (Signed)
Physical Therapy Weekly Progress Note  Patient Details  Name: [name] MRN: [id number] Date of Birth: [birth year]  Today's Date: 05/03/2012 Time: 1100-1200 Time Calculation (min): 60 min  Patient has met 3 of 4 short term goals.  Pt did not meet the short term stair goal. Pt has been limited by medical complication, weakness, and decreased motivation for participation.   Patient continues to demonstrate the following deficits: weakness, very limited endurance, decreased balance and therefore will continue to benefit from skilled PT intervention to enhance overall performance with activity tolerance, balance, ability to compensate for deficits and awareness.  Patient progressing toward long term goals. slowly.  Continue plan of care.  PT Short Term Goals Week 1:  PT Short Term Goal 1 (Week 1): Patient will be able to perform Bed mobility with Min-Assist PT Short Term Goal 1 - Progress (Week 1): Met PT Short Term Goal 2 (Week 1): Patient will be able to perform Transfers with Min-Assist PT Short Term Goal 2 - Progress (Week 1): Met PT Short Term Goal 3 (Week 1): Patient will be able to perform gait using RW x 30' with Min-Assist PT Short Term Goal 3 - Progress (Week 1): Met PT Short Term Goal 4 (Week 1): Patient will be able to ascend/descend 2 steps using B handrails with Min-Assist PT Short Term Goal 4 - Progress (Week 1): Not met  Skilled Therapeutic Interventions/Progress Updates:     Pt very lethargic/sleepy at start and throughout session. Pt with bowel incontinence at start of treatment, cleaned and brief changed. NG tube pinned to gown prior to therapy. Stand pivot transfer performed with min assist and RW careful attention to all lines and none were pulled. Upon sitting pt coughed and NG tube came out of his nose. RN made aware.   Standing tolerance activities, pt able to tolerate 1-2 min at a time. Gait x 30 feet with RW, min assist. Bil. LE exercises sitting in chair: marching,  long arc quads, hip abduction/adduction 2 x 10 reps each. SpO2 > 94% on 2L [place] O2 throughout treatment. Max encouragement needed for participation.   Therapy Documentation Precautions:  Precautions Precautions: Fall Precaution Comments: NG tube, multiple decubitus lcers to sacrum and lower legs Required Braces or Orthoses: Other Brace/Splint Other Brace/Splint: pressure relieving heel protectors when in bed Restrictions Weight Bearing Restrictions: No Pain: Pain Assessment Pain Assessment: No/denies pain Locomotion : Ambulation Ambulation/Gait Assistance: 4: Min assist   See FIM for current functional status  Therapy/Group: Individual Therapy  [name] 05/03/2012, 12:21 PM

## 2012-05-03 NOTE — Progress Notes (Signed)
Occupational Therapy Weekly Progress Note  Patient Details  Name: [name] MRN: [id number] Date of Birth: [birth year]  Today's Date: 05/03/2012   Patient has met 2 of 4 short term goals.  Pt has made minimal gains this past week and continues to require mod to max A for LB dressing tasks.  Pt fatigues quickly and requires extra time to complete all tasks with multiple rest breaks.  Pt performs toilet transfers with mod A.    Patient continues to demonstrate the following deficits: decreased overall activity tolerance/endurance, decreased functional mobility, decreased functional use of bilateral UEs/hands, decreased independence with ADLs & IADLs. Therefore, patient will continue to benefit from skilled OT intervention to enhance overall performance with BADL, iADL and Reduce care partner burden.  Patient progressing toward long term goals..  Continue plan of care.   OT Short Term Goals Week 1:  OT Short Term Goal 1 (Week 1): Pt will tolerate 3 mins standing during grooming activities before needing to rest. OT Short Term Goal 1 - Progress (Week 1): Met OT Short Term Goal 2 (Week 1): Pt will perform LB bathing and dressing with min assist sit to stand with AE PRN. OT Short Term Goal 2 - Progress (Week 1): Progressing toward goal OT Short Term Goal 3 (Week 1): Pt will perform toilet transfers with min assist using RW to 3:1 OT Short Term Goal 3 - Progress (Week 1): Progressing toward goal OT Short Term Goal 4 (Week 1): Pt will perform BUE light therex with suprevision to increase overall endurance for basic selfcare tasks and functional transfers. OT Short Term Goal 4 - Progress (Week 1): Met  Week 2:  OT Short Term Goal 1 (Week 2): Pt will perform lower body bathing with min A. OT Short Term Goal 2 (Week 2): Pt will perform toilet transfers with min assist using RW to 3:1 OT Short Term Goal 3 (Week 2): Pt will perform toileting tasks (3/3) with steady assist. OT Short Term Goal 4 (Week  2): Pt will perform lower body dressing with min A.  Interventions: Balance/vestibular training;Community reintegration;Discharge planning;Disease mangement/prevention;DME/adaptive equipment instruction;Functional mobility training;Neuromuscular re-education;Pain management;Patient/family education;Psychosocial support;Self Care/advanced ADL retraining;Skin care/wound managment;Splinting/orthotics;Therapeutic Activities;Therapeutic Exercise;UE/LE Strength taining/ROM;UE/LE Coordination activities;Wheelchair propulsion/positioning   Precautions:  Precautions Precautions: Fall Precaution Comments: NG tube, multiple decubitus lcers to sacrum and lower legs Required Braces or Orthoses: Other Brace/Splint Other Brace/Splint: pressure relieving heel protectors when in bed Restrictions Weight Bearing Restrictions: No  See FIM for current functional status  [name] 05/03/2012, 12:42 PM

## 2012-05-03 NOTE — Consult Note (Signed)
[facility]  Total days of antibiotics: None                            Reason for Consult: Unexplained leukocytosis   Referring Physician:Zachary Ermalene Postin, MD  Principal Problem:  *Sepsis   . aspirin  81 mg Oral Daily  . collagenase   Topical Daily  . feeding supplement  30 mL Per Tube Daily  . ferrous sulfate  300 mg Per Tube TID  . heparin subcutaneous  5,000 Units Subcutaneous Q8H  . insulin aspart  0-20 Units Subcutaneous Q4H  . insulin glargine  15 Units Subcutaneous Daily  . insulin glargine  20 Units Subcutaneous QHS  . levothyroxine  75 mcg Per Tube QAC breakfast  . metoprolol tartrate  12.5 mg Per Tube BID  . predniSONE  60 mg Per Tube Q breakfast  . ranitidine  150 mg Oral BID  . DISCONTD: free water  200 mL Per Tube Q8H  . DISCONTD: moxifloxacin  400 mg Intravenous Q24H   HPI: [name] is a 78 y.o. male PMH of DM, hypothyroidism admitted with DKA on 04/04/12 and complicated with sepsis due to skin infection vs pneumonia with shock and respiratory failure. Pt has multiple wounds most significant located on the right lateral malleolus and left lower buttocks for more than 3 months. Pt received Vancomycin from 8/1 to 8/8 and Zosyn 8/1 to 8/12.  Zosyn restarted on 8/22 and continued until 8/29. EGD on 8/22 shows Esophageal candidiasis. Pt is receiving 7 days courses of Diflucan and dysphagia improving.Pt is on prednisone 60 mg since 8/23. Pt is afebrile and Tmax of 98.3. WBC trending up and 38.5 in this morning lab.  Review of Systems: Pertinent items are noted in HPI.  Past Medical History  Diagnosis Date  . Diabetes mellitus   . Cancer   . Thyroid disease   . Anemia   . Hyperlipidemia   . Neuropathy, diabetic   . Diabetic retinopathy    Past Surgical History  Procedure Date  . Cataract extraction, bilateral   . Colonoscopy w/ polypectomy   . Esophagogastroduodenoscopy 04/25/2012    Procedure: ESOPHAGOGASTRODUODENOSCOPY (EGD);   Surgeon: Meryl Dare, MD,FACG;  Location: [facility];  Service: Endoscopy;  Laterality: N/A;   History  Substance Use Topics  . Smoking status: Former Smoker -- 0.2 packs/day    Types: Cigarettes    Quit date: 11/22/2006  . Smokeless tobacco: Never Used  . Alcohol Use: No    Family History  Problem Relation Age of Onset  . Hypertension     No Known Allergies  OBJECTIVE: Blood pressure 115/57, pulse 71, temperature 98.3 F (36.8 C), temperature source Oral, resp. rate 17, weight 64.365 kg (141 lb 14.4 oz), SpO2 100.00%.  General: resting in bed HEENT: PERRL, EOMI, no scleral icterus Cardiac: RRR, no rubs, murmurs or gallops Pulm: clear to auscultation bilaterally, moving normal volumes of air Abd: soft, nontender, nondistended, BS present Ext: warm and well perfused, no pedal edema Skin: 4 cm open wound on rt lateral malleolus, multiple ulcers on the left buttok Neuro: alert and oriented X3, cranial nerves II-XII grossly intact  Microbiology: Recent Results (from the past 240 hour(s))  URINE CULTURE     Status: Normal   Collection Time   04/30/12 12:13 PM      Component Value Range Status Comment   Specimen Description URINE, RANDOM   Final  Special Requests NONE   Final    Culture  Setup Time 04/30/2012 13:00   Final    Colony Count >=100,000 COLONIES/ML   Final    Culture     Final    Value: Multiple bacterial morphotypes present, none predominant. Suggest appropriate recollection if clinically indicated.   Report Status 05/02/2012 FINAL   Final    Lab results: Basic Metabolic Panel:  Lab 05/03/12 4696 05/02/12 0650  NA 129* 129*  K 4.6 5.1  CL 90* 91*  CO2 32 30  GLUCOSE 115* 189*  BUN 25* 30*  CREATININE 0.74 0.75  CALCIUM 8.4 8.7  MG -- --  PHOS -- --   Liver Function Tests:  Lab 05/03/12 0655 05/02/12 0650  AST 18 22  ALT 18 22  ALKPHOS 123* 120*  BILITOT 0.1* 0.1*  PROT 5.8* 6.0  ALBUMIN 1.4* 1.6*   CBC:  Lab 05/03/12 0655 05/02/12  0650 04/29/12 0632  WBC 38.5* 31.6* --  NEUTROABS 35.8* -- 18.9*  HGB 8.3* 7.7* --  HCT 25.6* 24.0* --  MCV 100.4* 101.3* --  PLT 487* 497* --   Thyroid Function Tests:  Lab 05/02/12 0650  TSH 5.260*  T4TOTAL --  FREET4 --  T3FREE --  THYROIDAB --   Urinalysis:  Lab 04/30/12 1213  COLORURINE YELLOW  LABSPEC 1.018  PHURINE 7.0  GLUCOSEU NEGATIVE  HGBUR LARGE*  BILIRUBINUR NEGATIVE  KETONESUR NEGATIVE  PROTEINUR 30*  UROBILINOGEN 1.0  NITRITE NEGATIVE  LEUKOCYTESUR LARGE*   Imaging results:  Dg Ankle 2 Views Right  05/03/2012  *RADIOLOGY REPORT*  Clinical Data: Ankle wound  RIGHT ANKLE - 2 VIEW  Comparison: 03/29/2012  Findings: There is a soft tissue defect lateral to the lateral malleolus.  No subcutaneous gas.  No   cortical destruction or periosteal reaction to suggest bone involvement.  Ankle mortise intact.  Negative for fracture, dislocation, or other acute bony abnormality.  No significant osseous degenerative change.  Patchy tibial arterial calcifications are noted.  IMPRESSION:  Lateral skin defect without bony abnormality.     Dg Abd 1 View  05/02/2012  *RADIOLOGY REPORT*  Clinical Data: Bedside nasogastric tube placement.  ABDOMEN - 1 VIEW  Comparison: One-view abdomen x-ray earlier same date 1116 hours and dating back to 04/23/2012.  Findings: Nasogastric tube tip overlies the expected location of the gastric antrum or duodenal bulb.  Nonobstructive bowel gas pattern.  Residual contrast material/barium throughout the upper normal caliber colon from cecum to rectum; in review of prior examinations, it appears the contrast was administered on 04/23/2012.  IMPRESSION:  1.  Nasogastric tube tip overlies the gastric antrum or duodenal bulb. 2.  No acute abdominal abnormality. 3.  Residual contrast material/barium throughout upper normal caliber colon.  This was indeed administered 8 days ago, this would be consistent with colonic stasis.   Original Report Authenticated By:  Arnell Sieving, M.D.    Dg Abd 1 View  05/02/2012  *RADIOLOGY REPORT*  Clinical Data: Check NG tube placement  ABDOMEN - 1 VIEW  Comparison: 05/01/2012  Findings: NG tube now terminates in the lower esophagus.  Nonobstructive bowel gas pattern.  Residual contrast within the colon.  Severe degenerative changes of the left hip.  IMPRESSION: NG tube now terminates in the lower esophagus.  These results will be called to the ordering clinician or representative by the Radiologist Assistant, and communication documented in the PACS Dashboard.   Original Report Authenticated By: Charline Bills, M.D.    Dg Abd  Portable 1v  05/01/2012  *RADIOLOGY REPORT*  Clinical Data: Confirm tube placement  PORTABLE ABDOMEN - 1 VIEW  Comparison: 05/01/2012 at 1030 hrs  Findings: Enteric tube terminates in the antropyloric region.  Residual contrast in the colon.  Patchy bilateral interstitial/airspace opacities, right greater than left.  IMPRESSION: Enteric tube terminates in the antropyloric region.   Original Report Authenticated By: Charline Bills, M.D.    Assessment & Plan by Problem:  1.  Leukocytosis:   His open wound over his malleolus would be a possible source of infection. He has received more than adequate therapy for pneumonia, soft tissue infection  1. MRI Rt ankle with contrat 2. F/u blood culture 3. F/u ESR/CRP 4. F/u CBC and BMP 5. HIV antibody 6.F/u wound culture 7. Wound care  2. Esophageal candidiasis: EGD on 8/22 shows Esophageal candidiasis. Pt is receiving 7 days courses of Diflucan and dysphagia improving. 1. Continue Diflucan for 14 days course     This is a Psychologist, occupational Note.  The care of the patient was discussed with Dr. Daiva Eves  assessment and plan was formulated with their assistance.  Please see their note for official documentation of the patient encounter.   Signed: Junious Silk 05/03/2012, 11:43 AM   INFECTIOUS DISEASE ATTENDING ADDENDUM:     [facility]   Date: 05/03/2012  Patient name: [name]  Medical record number: [id number]  Date of birth: [birth year]    This patient has been seen and discussed with the house staff. Please see their note for complete details. I concur with their findings with the following additions/corrections:  78 year old admitted with what was apparent sepsis and DKA though clear-cut source for former never identified beyond possible CAP.  CERTAINLY HIS PREDNISONE COULD BE PARTIALLY DRIVING HIS CURRENT LEUKOCYTOSIS SINCE LEUKOCYTOSIS WORSENED SIGNIFICANTLY SINCE INITIATION OF PREDNISONE ON 04/27/12  I WOULD CONSIDER TAPERING IT, VS STOPPING IT ALTOGETHER  I WILL ORDER MRI OF HIS RIGHT ANKLE WHICH HAS A VERY DEEP WOUND THERE  I WILL HOLD OFF ON FURTHER CT CHEST ABDOMEN AND PELVIS FOR NOW  I WILL CHECK HIV AB    Acey Lav 05/03/2012, 7:33 PM\

## 2012-05-03 NOTE — Progress Notes (Signed)
Speech Language Pathology Daily Session Note  Patient Details  Name: [name] MRN: [id number] Date of Birth: 09-Oct-1928  Today's Date: 05/03/2012 Time: 1478-2956 Time Calculation (min): 40 min  Short Term Goals: Week 1: SLP Short Term Goal 1 (Week 1): Pt will consume trials of thin liquids via spoon without overt s/s of aspiration with 75% of trials SLP Short Term Goal 2 (Week 1): Pt will consume trials of puree textures without overt s/s of aspiration with 75% of trials. SLP Short Term Goal 3 (Week 1): Pt will recall new, daily information with supervision verbal and visual cues.   Skilled Therapeutic Interventions: Treatment focus on diaphragmatic breathing, swallow initiation and trials of thin liquids.  SLP facilitated session by utilizing thermal tactile stimulation to increase swallow initiation, pt initiated swallow in ~3 seconds, however, pt's swallow initiation with trails of thin liquids (ice chips and water) increased to an average of 4-5 seconds.  Pt utilized effortful swallows with trails with Min verbal and demonstration cues. Pt demonstrating intermittent coughing before, during and after trials and required Mod verbal, tactile and demonstration cues to utilize diaphragmatic breathing to increase breath support for cough.    FIM:  Comprehension Comprehension Mode: Auditory Comprehension: 5-Understands basic 90% of the time/requires cueing < 10% of the time Expression Expression Mode: Verbal Expression: 5-Expresses basic 90% of the time/requires cueing < 10% of the time. Social Interaction Social Interaction: 4-Interacts appropriately 75 - 89% of the time - Needs redirection for appropriate language or to initiate interaction. Problem Solving Problem Solving: 5-Solves basic 90% of the time/requires cueing < 10% of the time Memory Memory: 5-Recognizes or recalls 90% of the time/requires cueing < 10% of the time FIM - Eating Eating Activity: 0: Activity did not  occur  Pain Pain Assessment Pain Assessment: No/denies pain  Therapy/Group: Individual Therapy  [name] 05/03/2012, 10:11 AM

## 2012-05-04 ENCOUNTER — Inpatient Hospital Stay (HOSPITAL_COMMUNITY): Payer: Medicare Other | Admitting: Speech Pathology

## 2012-05-04 ENCOUNTER — Inpatient Hospital Stay (HOSPITAL_COMMUNITY): Payer: Medicare Other | Admitting: Physical Therapy

## 2012-05-04 ENCOUNTER — Inpatient Hospital Stay (HOSPITAL_COMMUNITY): Payer: Medicare Other

## 2012-05-04 LAB — BASIC METABOLIC PANEL
BUN: 26 mg/dL — ABNORMAL HIGH (ref 6–23)
CO2: 32 mEq/L (ref 19–32)
Calcium: 8.5 mg/dL (ref 8.4–10.5)
GFR calc non Af Amer: 86 mL/min — ABNORMAL LOW (ref >90)
Glucose, Bld: 126 mg/dL — ABNORMAL HIGH (ref 70–99)
Potassium: 4.6 mEq/L (ref 3.5–5.1)

## 2012-05-04 LAB — GLUCOSE, CAPILLARY
Glucose-Capillary: 116 mg/dL — ABNORMAL HIGH (ref 70–99)
Glucose-Capillary: 126 mg/dL — ABNORMAL HIGH (ref 70–99)
Glucose-Capillary: 162 mg/dL — ABNORMAL HIGH (ref 70–99)
Glucose-Capillary: 203 mg/dL — ABNORMAL HIGH (ref 70–99)

## 2012-05-04 LAB — CBC WITH DIFFERENTIAL/PLATELET
Eosinophils Absolute: 0 10*3/uL (ref 0.0–0.7)
MCH: 32.4 pg (ref 26.0–34.0)
MCHC: 32.7 g/dL (ref 30.0–36.0)
Monocytes Absolute: 1.5 10*3/uL — ABNORMAL HIGH (ref 0.1–1.0)
Neutrophils Relative %: 93 % — ABNORMAL HIGH (ref 43–77)
Platelets: 482 10*3/uL — ABNORMAL HIGH (ref 150–400)

## 2012-05-04 LAB — HIV ANTIBODY (ROUTINE TESTING W REFLEX): HIV: NONREACTIVE

## 2012-05-04 MED ORDER — GADOBENATE DIMEGLUMINE 529 MG/ML IV SOLN
15.0000 mL | Freq: Once | INTRAVENOUS | Status: AC | PRN
  Administered 2012-05-04: 15 mL via INTRAVENOUS

## 2012-05-04 NOTE — Progress Notes (Signed)
[facility]    Subjective: No new complaints   Antibiotics:  Anti-infectives     Start     Dose/Rate Route Frequency Ordered Stop   05/02/12 2000   moxifloxacin (AVELOX) IVPB 400 mg  Status:  Discontinued        400 mg 250 mL/hr over 60 Minutes Intravenous Every 24 hours 05/02/12 0816 05/03/12 0848   05/02/12 0830   moxifloxacin (AVELOX) tablet 400 mg  Status:  Discontinued        400 mg Oral Daily 05/02/12 0815 05/02/12 0816   04/30/12 2000   ciprofloxacin (CIPRO) tablet 250 mg  Status:  Discontinued        250 mg Per NG tube 2 times daily 04/30/12 1808 05/02/12 0815   04/26/12 1900   fluconazole (DIFLUCAN) IVPB 200 mg        200 mg 100 mL/hr over 60 Minutes Intravenous Daily 04/26/12 1848 05/02/12 0847          Medications: Scheduled Meds:   . aspirin  81 mg Oral Daily  . collagenase   Topical Daily  . feeding supplement  30 mL Per Tube Daily  . ferrous sulfate  300 mg Per Tube TID  . heparin subcutaneous  5,000 Units Subcutaneous Q8H  . insulin aspart  0-20 Units Subcutaneous Q4H  . insulin glargine  15 Units Subcutaneous Daily  . insulin glargine  20 Units Subcutaneous QHS  . levothyroxine  75 mcg Per Tube QAC breakfast  . metoprolol tartrate  12.5 mg Per Tube BID  . predniSONE  60 mg Per Tube Q breakfast  . ranitidine  150 mg Oral BID   Continuous Infusions:   . feeding supplement (GLUCERNA 1.2 CAL) 1,000 mL (05/01/12 1400)   PRN Meds:.albuterol, antiseptic oral rinse, gadobenate dimeglumine, HYDROcodone-acetaminophen, ipratropium, ondansetron (ZOFRAN) IV, ondansetron, sorbitol   Objective: Weight change:   Intake/Output Summary (Last 24 hours) at 05/04/12 1257 Last data filed at 05/03/12 1500  Gross per 24 hour  Intake      0 ml  Output    350 ml  Net   -350 ml   Blood pressure 122/62, pulse 75, temperature 97.8 F (36.6 C), temperature source Oral, resp. rate 17, weight 141 lb 14.4 oz (64.365 kg), SpO2  94.00%. Temp:  [97.8 F (36.6 C)-98.1 F (36.7 C)] 97.8 F (36.6 C) (08/31 0500) Pulse Rate:  [70-80] 75  (08/31 1033) Resp:  [17-18] 17  (08/31 0500) BP: (104-123)/(61-73) 122/62 mmHg (08/31 1033) SpO2:  [94 %-100 %] 94 % (08/31 0500)  Physical Exam: General: Alert and awake, oriented x2  not in any acute distress. HEENT: anicteric sclera, pupils reactive to light and accommodation, EOMI CVS regular rate, normal r,  no murmur rubs or gallops Chest: clear to auscultation bilaterally, anteriorly Abdomen: soft nontender, nondistended, normal bowel sounds, Extremities: right ankle with deep ulcer ,  Skin: he has decubitus ulcers but none go to bone and none show evidenceof cellulitis. Lymph: no new lymphadenopathy Neuro: nonfocal  Lab Results:  Basename 05/04/12 0500 05/03/12 1707  WBC 38.1* 42.3*  HGB 8.0* 8.3*  HCT 24.5* 25.6*  PLT 482* 495*    BMET  Basename 05/04/12 0500 05/03/12 0655  NA 129* 129*  K 4.6 4.6  CL 91* 90*  CO2 32 32  GLUCOSE 126* 115*  BUN 26* 25*  CREATININE 0.70 0.74  CALCIUM 8.5 8.4    Micro Results: Recent Results (from the past 240 hour(s))  URINE CULTURE  Status: Normal   Collection Time   04/30/12 12:13 PM      Component Value Range Status Comment   Specimen Description URINE, RANDOM   Final    Special Requests NONE   Final    Culture  Setup Time 04/30/2012 13:00   Final    Colony Count >=100,000 COLONIES/ML   Final    Culture     Final    Value: Multiple bacterial morphotypes present, none predominant. Suggest appropriate recollection if clinically indicated.   Report Status 05/02/2012 FINAL   Final   WOUND CULTURE     Status: Normal (Preliminary result)   Collection Time   05/03/12  8:30 AM      Component Value Range Status Comment   Specimen Description WOUND FOOT   Final    Special Requests RIGHT LATERAL MALLEOLUS   Final    Gram Stain PENDING   Incomplete    Culture Culture reincubated for better growth   Final    Report  Status PENDING   Incomplete     Studies/Results: Dg Ankle 2 Views Right  05/03/2012  *RADIOLOGY REPORT*  Clinical Data: Ankle wound  RIGHT ANKLE - 2 VIEW  Comparison: 03/29/2012  Findings: There is a soft tissue defect lateral to the lateral malleolus.  No subcutaneous gas.  No   cortical destruction or periosteal reaction to suggest bone involvement.  Ankle mortise intact.  Negative for fracture, dislocation, or other acute bony abnormality.  No significant osseous degenerative change.  Patchy tibial arterial calcifications are noted.  IMPRESSION:  Lateral skin defect without bony abnormality.   Original Report Authenticated By: Osa Craver, M.D.    Dg Abd 1 View  05/03/2012  *RADIOLOGY REPORT*  Clinical Data: Nasogastric tube replacement.  ABDOMEN - 1 VIEW  Comparison: One-view abdomen x-ray yesterday and dating back to 04/30/2012.  Findings: Nasogastric tube is looped in the distal esophagus, and its tip is not included on the image.  Visualized bowel gas pattern unremarkable.  Residual barium and stool throughout the colon as noted on multiple recent examinations.  IMPRESSION: Nasogastric tube looped in the distal esophagus and its tip is not included on the image.  This needs to be withdrawn and replaced.  Critical Value/emergent results were called by telephone at the time of interpretation on 05/03/2012 at 1440 hours to [facility], the nurse caring for the patient on the 4000 unit, who verbally acknowledged these results.   Original Report Authenticated By: Arnell Sieving, M.D.    Dg Abd 1 View  05/02/2012  *RADIOLOGY REPORT*  Clinical Data: Bedside nasogastric tube placement.  ABDOMEN - 1 VIEW  Comparison: One-view abdomen x-ray earlier same date 1116 hours and dating back to 04/23/2012.  Findings: Nasogastric tube tip overlies the expected location of the gastric antrum or duodenal bulb.  Nonobstructive bowel gas pattern.  Residual contrast material/barium throughout the upper normal  caliber colon from cecum to rectum; in review of prior examinations, it appears the contrast was administered on 04/23/2012.  IMPRESSION:  1.  Nasogastric tube tip overlies the gastric antrum or duodenal bulb. 2.  No acute abdominal abnormality. 3.  Residual contrast material/barium throughout upper normal caliber colon.  This was indeed administered 8 days ago, this would be consistent with colonic stasis.   Original Report Authenticated By: Arnell Sieving, M.D.    Mr Ankle Right W Wo Contrast  05/04/2012  *RADIOLOGY REPORT*  Clinical Data: Right lateral ankle wound.  Osteomyelitis. Ulceration of the lateral ankle.  MRI OF THE RIGHT ANKLE WITHOUT AND WITH CONTRAST  Technique:  Multiplanar, multisequence MR imaging was performed both before and after administration of intravenous contrast.  Contrast: 15mL MULTIHANCE GADOBENATE DIMEGLUMINE 529 MG/ML IV SOLN  Comparison: Radiographs 05/03/2012.  Findings: There is an ulcer overlying the lateral malleolus.  There is adjacent osteomyelitis of the lateral malleolus with bone marrow edema, effacement of normal marrow, cortical osteolysis and post- gadolinium enhancement.  There is no soft tissue abscess.  Diffuse subcutaneous edema is present, lateral predominant which may represent cellulitis.  Fatty atrophy of the distal leg musculature is present.  Subtalar osteoarthritis incidentally noted.  The Achilles tendon is intact.  Peroneal tendons appear within normal limits.  Posteromedial tendons and anterior tendons are normal. Lateral stabilizing ligaments and deltoid ligament appear within normal limits.  IMPRESSION: Lateral malleolar ulcer and osteomyelitis of the tip of the lateral malleolus.   Original Report Authenticated By: Andreas Newport, M.D.    Dg Naso G Tube Plc W/fl W/rad  05/03/2012  *RADIOLOGY REPORT*  Clinical Data:  Dysphagia.  NG tube placement.  Fluoroscopic guided NG tube placement.  Comparison:  Abdominal film 05/03/2012.  Fluoroscopy Time:   0.15 minutes.  Technique:  A nasogastric tube was placed via of the left nostril down into the stomach.  The tip is in the antral region.  The patient tolerated the procedure well.  IMPRESSION: NG tube placed into the stomach.  The tip is in the antral region.   Original Report Authenticated By: P. Loralie Champagne, M.D.       Assessment/Plan: [name] is a 78 y.o. male with admission with diabetic ketoacidosis and dictation concerning for sepsis who is managing broad-spectrum antibiotics, and has received more than adequate coverage for community acquired or even healthcare associated pneumonia or cellulitis who has had persistent leukocytosis while being a patient on the physical medicine and rehabilitation service. MRI done today shows osteomyelitis of his right malleolus which may partially explain his leukocytosis. He has also been on high dose prednisone  1) Osteomyelitis: --I. Would consult orthopedic surgery to see if the patient might require aggressive surgery such as an amputation vs aggressive debridemtn of the  area of osteomyelitis  for therapeutic and diagnostic reasons and it material could be sent for culture.  --I would not start antibiotics again without DEEP bone cultures to guide therapy.  #2 leukocytosis: Likely a combination of the patient's the myelitis and his corticosteroid therapy. Patient is weak and deconditioned and certainly at risk for aspiration pneumonia though I am unimpressed for that diagnosis at this point in time.  LOS: 8 days   Acey Lav 05/04/2012, 12:57 PM

## 2012-05-04 NOTE — Progress Notes (Signed)
Speech Language Pathology Daily Session Note  Patient Details  Name: [name] MRN: [id number] Date of Birth: 02/02/1929  Today's Date: 05/04/2012 Time: 7829-5621 Time Calculation (min): 15 min  Short Term Goals: Week 1: SLP Short Term Goal 1 (Week 1): Pt will consume trials of thin liquids via spoon without overt s/s of aspiration with 75% of trials SLP Short Term Goal 1 - Progress (Week 1): Progressing toward goal SLP Short Term Goal 2 (Week 1): Pt will consume trials of puree textures without overt s/s of aspiration with 75% of trials. SLP Short Term Goal 2 - Progress (Week 1): Progressing toward goal SLP Short Term Goal 3 (Week 1): Pt will recall new, daily information with supervision verbal and visual cues.   Skilled Therapeutic Interventions: Limited session due to pt returning from MRI.  Pt independently participated in oral care.  Provided with ice chip trials with use of effortful swallow with min verbal and visual cues.  Intermittent, weak cough throughout and multiple swallows per bolus noted.   Pt appeared to fatigue after seven boluses with increased oral prep time and duration prior to swallow initiation.   Will likely be ready for repeat instrumental swallow study early next week as conditioning and functional reserve improve.   FIM:  Comprehension Comprehension Mode: Auditory Comprehension: 5-Understands basic 90% of the time/requires cueing < 10% of the time Expression Expression: 5-Expresses basic 90% of the time/requires cueing < 10% of the time. Social Interaction Social Interaction: 4-Interacts appropriately 75 - 89% of the time - Needs redirection for appropriate language or to initiate interaction. Problem Solving Problem Solving: 5-Solves basic 90% of the time/requires cueing < 10% of the time Memory Memory: 5-Recognizes or recalls 90% of the time/requires cueing < 10% of the time FIM - Eating Eating Activity: 0: Activity did not occur  Pain Pain  Assessment Pain Assessment: No/denies pain  Therapy/Group: Individual Therapy  Blenda Mounts Laurice 05/04/2012, 9:50 AM

## 2012-05-04 NOTE — Progress Notes (Signed)
Patient ID: [name], male   DOB: [birth year], 78 y.o.   MRN: [id number] Subjective/Complaints: 8/31.  No complaints. Uneventful night. States that he is hungry and wishes to eat. A 12 point review of systems has been performed and if not noted above is otherwise negative.  ENT unremarkable -nasal cannula oxygen and nasogastric feeding tube in place. Chest clear anterolaterally. Cardiovascular normal heart sounds no tachycardia. Abdomen soft active bowel sounds. Extremities no edema  CBG (last 3)   Basename 05/04/12 0737 05/04/12 0421 05/04/12 0011  GLUCAP 116* 137* 203*     BP Readings from Last 3 Encounters:  05/04/12 104/65  04/26/12 123/54  04/26/12 123/54   Objective: Vital Signs: Blood pressure 104/65, pulse 70, temperature 97.8 F (36.6 C), temperature source Oral, resp. rate 17, weight 64.365 kg (141 lb 14.4 oz), SpO2 94.00%. Dg Ankle 2 Views Right  05/03/2012  *RADIOLOGY REPORT*  Clinical Data: Ankle wound  RIGHT ANKLE - 2 VIEW  Comparison: 03/29/2012  Findings: There is a soft tissue defect lateral to the lateral malleolus.  No subcutaneous gas.  No   cortical destruction or periosteal reaction to suggest bone involvement.  Ankle mortise intact.  Negative for fracture, dislocation, or other acute bony abnormality.  No significant osseous degenerative change.  Patchy tibial arterial calcifications are noted.  IMPRESSION:  Lateral skin defect without bony abnormality.   Original Report Authenticated By: Osa Craver, M.D.    Dg Abd 1 View  05/03/2012  *RADIOLOGY REPORT*  Clinical Data: Nasogastric tube replacement.  ABDOMEN - 1 VIEW  Comparison: One-view abdomen x-ray yesterday and dating back to 04/30/2012.  Findings: Nasogastric tube is looped in the distal esophagus, and its tip is not included on the image.  Visualized bowel gas pattern unremarkable.  Residual barium and stool throughout the colon as noted on multiple recent examinations.  IMPRESSION: Nasogastric tube  looped in the distal esophagus and its tip is not included on the image.  This needs to be withdrawn and replaced.  Critical Value/emergent results were called by telephone at the time of interpretation on 05/03/2012 at 1440 hours to [facility], the nurse caring for the patient on the 4000 unit, who verbally acknowledged these results.   Original Report Authenticated By: Arnell Sieving, M.D.    Dg Abd 1 View  05/02/2012  *RADIOLOGY REPORT*  Clinical Data: Bedside nasogastric tube placement.  ABDOMEN - 1 VIEW  Comparison: One-view abdomen x-ray earlier same date 1116 hours and dating back to 04/23/2012.  Findings: Nasogastric tube tip overlies the expected location of the gastric antrum or duodenal bulb.  Nonobstructive bowel gas pattern.  Residual contrast material/barium throughout the upper normal caliber colon from cecum to rectum; in review of prior examinations, it appears the contrast was administered on 04/23/2012.  IMPRESSION:  1.  Nasogastric tube tip overlies the gastric antrum or duodenal bulb. 2.  No acute abdominal abnormality. 3.  Residual contrast material/barium throughout upper normal caliber colon.  This was indeed administered 8 days ago, this would be consistent with colonic stasis.   Original Report Authenticated By: Arnell Sieving, M.D.    Dg Abd 1 View  05/02/2012  *RADIOLOGY REPORT*  Clinical Data: Check NG tube placement  ABDOMEN - 1 VIEW  Comparison: 05/01/2012  Findings: NG tube now terminates in the lower esophagus.  Nonobstructive bowel gas pattern.  Residual contrast within the colon.  Severe degenerative changes of the left hip.  IMPRESSION: NG tube now terminates in the lower esophagus.  These results will be called to the ordering clinician or representative by the Radiologist Assistant, and communication documented in the PACS Dashboard.   Original Report Authenticated By: Charline Bills, M.D.    Dg Naso G Tube Plc W/fl W/rad  05/03/2012  *RADIOLOGY REPORT*  Clinical  Data:  Dysphagia.  NG tube placement.  Fluoroscopic guided NG tube placement.  Comparison:  Abdominal film 05/03/2012.  Fluoroscopy Time:  0.15 minutes.  Technique:  A nasogastric tube was placed via of the left nostril down into the stomach.  The tip is in the antral region.  The patient tolerated the procedure well.  IMPRESSION: NG tube placed into the stomach.  The tip is in the antral region.   Original Report Authenticated By: P. Loralie Champagne, M.D.     Basename 05/04/12 0500 05/03/12 1707  WBC 38.1* 42.3*  HGB 8.0* 8.3*  HCT 24.5* 25.6*  PLT 482* 495*    Basename 05/04/12 0500 05/03/12 0655  NA 129* 129*  K 4.6 4.6  CL 91* 90*  CO2 32 32  GLUCOSE 126* 115*  BUN 26* 25*  CREATININE 0.70 0.74  CALCIUM 8.5 8.4   CBG (last 3)   Basename 05/04/12 0737 05/04/12 0421 05/04/12 0011  GLUCAP 116* 137* 203*    Wt Readings from Last 3 Encounters:  05/01/12 64.365 kg (141 lb 14.4 oz)  04/26/12 65 kg (143 lb 4.8 oz)  04/26/12 65 kg (143 lb 4.8 oz)    Physical Exam:  HENT:  Head: Normocephalic.  NGT in place but appears to be pulled out 8 inches or so. Missing many teeth. Mucosa pink but with white film over tongue and pharynx. Phonation better Eyes:  Pupils round and reactive to light  Neck: Neck supple. No thyromegaly present.  Cardiovascular: Regular rhythm. Normal rate Pulmonary/Chest: Breath sounds normal. No respiratory distress. He has no wheezes. Occasional upper airway sounds heard as well as rhonchi Abdominal: Bowel sounds are normal. He exhibits no distension.  Neurological: He is alert.  Patient is a poor historian. He was able to give his date of birth and age.   He would follow simple commands. limited insight and awareness. He had difficulty controlling his secretions and a weak cough. Strength was symmetrical but he was weak more proximally than distally (2-3 prox to 4 distally). No focal sensory loss was discerned.  Skin:  Skin: 4cm lateral mallelolus wound with  fibronecrotic tissue which is sloughing. Buttock areas with fibronecrotic tissue also (x 2) about 1-2cm in diameter Psychiatric:  Patient with improved awareness and insight   Assessment/Plan: 1. Functional deficits secondary to sepsis/deconditioning which require 3+ hours per day of interdisciplinary therapy in a comprehensive inpatient rehab setting. Physiatrist is providing close team supervision and 24 hour management of active medical problems listed below. Physiatrist and rehab team continue to assess barriers to discharge/monitor patient progress toward functional and medical goals.  The patient requires restraints due to pulling out ngt  FIM: FIM - Bathing Bathing Steps Patient Completed: Chest;Right Arm;Left Arm;Abdomen;Front perineal area;Buttocks;Right upper leg;Left upper leg;Right lower leg (including foot);Left lower leg (including foot) Bathing: 4: Steadying assist  FIM - Upper Body Dressing/Undressing Upper body dressing/undressing steps patient completed: Thread/unthread right sleeve of pullover shirt/dresss;Thread/unthread left sleeve of pullover shirt/dress;Put head through opening of pull over shirt/dress;Pull shirt over trunk Upper body dressing/undressing: 5: Supervision: Safety issues/verbal cues FIM - Lower Body Dressing/Undressing Lower body dressing/undressing steps patient completed: Thread/unthread right pants leg;Pull pants up/down Lower body dressing/undressing: 2: Max-Patient completed 25-49%  of tasks  FIM - Toileting Toileting: 0: Activity did not occur  FIM - Diplomatic Services operational officer Devices: Grab bars;Elevated toilet seat Toilet Transfers: 0-Activity did not occur  FIM - Banker Devices: Therapist, occupational: 4: Supine > Sit: Min A (steadying Pt. > 75%/lift 1 leg);4: Sit > Supine: Min A (steadying pt. > 75%/lift 1 leg);4: Bed > Chair or W/C: Min A (steadying Pt. > 75%);4: Chair or W/C >  Bed: Min A (steadying Pt. > 75%)  FIM - Locomotion: Wheelchair Distance: 60' Locomotion: Wheelchair: 0: Activity did not occur FIM - Locomotion: Ambulation Locomotion: Ambulation Assistive Devices: Designer, industrial/product Ambulation/Gait Assistance: 4: Min assist Locomotion: Ambulation: 1: Travels less than 50 ft with minimal assistance (Pt.>75%)  Comprehension Comprehension Mode: Auditory Comprehension: 4-Understands basic 75 - 89% of the time/requires cueing 10 - 24% of the time  Expression Expression Mode: Verbal Expression: 4-Expresses basic 75 - 89% of the time/requires cueing 10 - 24% of the time. Needs helper to occlude trach/needs to repeat words.  Social Interaction Social Interaction: 4-Interacts appropriately 75 - 89% of the time - Needs redirection for appropriate language or to initiate interaction.  Problem Solving Problem Solving: 4-Solves basic 75 - 89% of the time/requires cueing 10 - 24% of the time  Memory Memory: 4-Recognizes or recalls 75 - 89% of the time/requires cueing 10 - 24% of the time Medical Problem List and Plan:  1. deconditioning related to sepsis multiple medical complications/metabolic encephalopathy/respiratory failure. Will discuss with critical care medicine on weaning of prednisone  2. DVT Prophylaxis/Anticoagulation: Subcutaneous heparin. Monitor platelet counts and any signs of bleeding  3. Pain Management: Lortab as needed. Monitor with increased mobility  4. Neuropsych: This patient is not capable of making decisions on his/her own behalf.  5. Dysphagia. Continue nasogastric tubes for now for nutritional support. Will replace today  -phonation is better and throat is less painful  -repeat MBS next week  -needs wrist restraints to keep from pulling ngt-tube is somewhat pulled out again 6.NSTEMI. Followup per cardiology services. Continue aspirin therapy as well as Lopressor 12.5 mg twice a day (hold today). No current chest pain or shortness of  breath  7. Esophageal candidiasis. Diflucan x7 days. Sx improving 8. Diabetes mellitus with peripheral neuropathy. Hemoglobin A1c 10.5. Lantus insulin 15 units at bedtime. added am lantus as well to better cover pm cbgs.  Check blood sugars a.c. and at bedtime   -continue to titrate further as indicated 9. Right lateral malleolus on left lower buttocks decubitus ulcers. Santyl for right lateral malleolus breakdown 10. Anemia. Latest hemoglobin 7.8. Monitor for a bleeding episodes and followup CBC with plan to transfuse if symptomatic  11. Hypothyroidism. Synthroid  12. Medical noncompliance. Will discuss at length with patient and family the need to maintain medical regimen 13. Leukocytosis: wbc's continue to rise. He has been on chronic prednisone   -cxr with multifocal conslidations which are likely infection vs edema.   -changed abx to avelox  -recheck labs today. He remains afebrile  -will continue diflucan via tube for now given that there is still visible thrush on tongue,mouth  -culture right lateral malleolus wound and check xrays of ankle also  -will ask for an ID consult 14. Hyponatremia:  -f/u labs today to determine plan.  -may need to back off free h2o a bit  LOS (Days) 8 A FACE TO FACE EVALUATION WAS PERFORMED  Rogelia Boga 05/04/2012, 8:48 AM

## 2012-05-04 NOTE — Progress Notes (Signed)
Physical Therapy Note  Patient Details  Name: [name] MRN: [id number] Date of Birth: 1928/10/20 Today's Date: 05/04/2012  1330 Pt refused any PT treatment . " I'm too tired "   Jara Feider,JIM 05/04/2012, 8:24 AM

## 2012-05-05 ENCOUNTER — Inpatient Hospital Stay (HOSPITAL_COMMUNITY): Payer: Medicare Other

## 2012-05-05 ENCOUNTER — Inpatient Hospital Stay (HOSPITAL_COMMUNITY): Payer: Medicare Other | Admitting: *Deleted

## 2012-05-05 LAB — GLUCOSE, CAPILLARY
Glucose-Capillary: 239 mg/dL — ABNORMAL HIGH (ref 70–99)
Glucose-Capillary: 165 mg/dL — ABNORMAL HIGH (ref 70–99)
Glucose-Capillary: 178 mg/dL — ABNORMAL HIGH (ref 70–99)
Glucose-Capillary: 203 mg/dL — ABNORMAL HIGH (ref 70–99)

## 2012-05-05 MED ORDER — DEXTROSE-NACL 5-0.9 % IV SOLN
INTRAVENOUS | Status: DC
  Administered 2012-05-05: 22:00:00 via INTRAVENOUS
  Administered 2012-05-05: 75 mL/h via INTRAVENOUS

## 2012-05-05 NOTE — Progress Notes (Signed)
[facility]     Subjective: Patient is having new feeding tube placed   Antibiotics:  Anti-infectives     Start     Dose/Rate Route Frequency Ordered Stop   05/02/12 2000   moxifloxacin (AVELOX) IVPB 400 mg  Status:  Discontinued        400 mg 250 mL/hr over 60 Minutes Intravenous Every 24 hours 05/02/12 0816 05/03/12 0848   05/02/12 0830   moxifloxacin (AVELOX) tablet 400 mg  Status:  Discontinued        400 mg Oral Daily 05/02/12 0815 05/02/12 0816   04/30/12 2000   ciprofloxacin (CIPRO) tablet 250 mg  Status:  Discontinued        250 mg Per NG tube 2 times daily 04/30/12 1808 05/02/12 0815   04/26/12 1900   fluconazole (DIFLUCAN) IVPB 200 mg        200 mg 100 mL/hr over 60 Minutes Intravenous Daily 04/26/12 1848 05/02/12 0847          Medications: Scheduled Meds:    . aspirin  81 mg Oral Daily  . collagenase   Topical Daily  . feeding supplement  30 mL Per Tube Daily  . ferrous sulfate  300 mg Per Tube TID  . heparin subcutaneous  5,000 Units Subcutaneous Q8H  . insulin aspart  0-20 Units Subcutaneous Q4H  . insulin glargine  15 Units Subcutaneous Daily  . insulin glargine  20 Units Subcutaneous QHS  . levothyroxine  75 mcg Per Tube QAC breakfast  . metoprolol tartrate  12.5 mg Per Tube BID  . predniSONE  60 mg Per Tube Q breakfast  . ranitidine  150 mg Oral BID   Continuous Infusions:    . dextrose 5 % and 0.9% NaCl    . feeding supplement (GLUCERNA 1.2 CAL) 1,000 mL (05/05/12 1051)   PRN Meds:.albuterol, antiseptic oral rinse, HYDROcodone-acetaminophen, ipratropium, ondansetron (ZOFRAN) IV, ondansetron, sorbitol   Objective: Weight change:   Intake/Output Summary (Last 24 hours) at 05/05/12 1609 Last data filed at 05/05/12 0631  Gross per 24 hour  Intake      0 ml  Output    300 ml  Net   -300 ml   Blood pressure 130/70, pulse 73, temperature 98.2 F (36.8 C), temperature source Oral, resp. rate 18, weight 141  lb 14.4 oz (64.365 kg), SpO2 98.00%. Temp:  [98.2 F (36.8 C)-98.5 F (36.9 C)] 98.2 F (36.8 C) (09/01 1606) Pulse Rate:  [73-83] 73  (09/01 1606) Resp:  [18] 18  (09/01 1606) BP: (108-133)/(52-70) 130/70 mmHg (09/01 1606) SpO2:  [90 %-98 %] 98 % (09/01 1606)  Physical Exam: General: Alert and awake, having new feeding tube placed Lab Results:  Basename 05/04/12 0500 05/03/12 1707  WBC 38.1* 42.3*  HGB 8.0* 8.3*  HCT 24.5* 25.6*  PLT 482* 495*    BMET  Basename 05/04/12 0500 05/03/12 0655  NA 129* 129*  K 4.6 4.6  CL 91* 90*  CO2 32 32  GLUCOSE 126* 115*  BUN 26* 25*  CREATININE 0.70 0.74  CALCIUM 8.5 8.4    Micro Results: Recent Results (from the past 240 hour(s))  URINE CULTURE     Status: Normal   Collection Time   04/30/12 12:13 PM      Component Value Range Status Comment   Specimen Description URINE, RANDOM   Final    Special Requests NONE   Final    Culture  Setup Time 04/30/2012  13:00   Final    Colony Count >=100,000 COLONIES/ML   Final    Culture     Final    Value: Multiple bacterial morphotypes present, none predominant. Suggest appropriate recollection if clinically indicated.   Report Status 05/02/2012 FINAL   Final   WOUND CULTURE     Status: Normal (Preliminary result)   Collection Time   05/03/12  8:30 AM      Component Value Range Status Comment   Specimen Description WOUND FOOT   Final    Special Requests RIGHT LATERAL MALLEOLUS   Final    Gram Stain     Final    Value: RARE WBC PRESENT, PREDOMINANTLY PMN     NO SQUAMOUS EPITHELIAL CELLS SEEN     NO ORGANISMS SEEN   Culture     Final    Value: ABUNDANT STAPHYLOCOCCUS AUREUS     Note: RIFAMPIN AND GENTAMICIN SHOULD NOT BE USED AS SINGLE DRUGS FOR TREATMENT OF STAPH INFECTIONS.   Report Status PENDING   Incomplete   URINE CULTURE     Status: Normal (Preliminary result)   Collection Time   05/03/12 11:22 AM      Component Value Range Status Comment   Specimen Description URINE, RANDOM    Final    Special Requests NONE   Final    Culture  Setup Time 05/03/2012 12:15   Final    Colony Count 75,000 COLONIES/ML   Final    Culture GRAM NEGATIVE RODS   Final    Report Status PENDING   Incomplete     Studies/Results: Mr Ankle Right W Wo Contrast  05/04/2012  *RADIOLOGY REPORT*  Clinical Data: Right lateral ankle wound.  Osteomyelitis. Ulceration of the lateral ankle.  MRI OF THE RIGHT ANKLE WITHOUT AND WITH CONTRAST  Technique:  Multiplanar, multisequence MR imaging was performed both before and after administration of intravenous contrast.  Contrast: 15mL MULTIHANCE GADOBENATE DIMEGLUMINE 529 MG/ML IV SOLN  Comparison: Radiographs 05/03/2012.  Findings: There is an ulcer overlying the lateral malleolus.  There is adjacent osteomyelitis of the lateral malleolus with bone marrow edema, effacement of normal marrow, cortical osteolysis and post- gadolinium enhancement.  There is no soft tissue abscess.  Diffuse subcutaneous edema is present, lateral predominant which may represent cellulitis.  Fatty atrophy of the distal leg musculature is present.  Subtalar osteoarthritis incidentally noted.  The Achilles tendon is intact.  Peroneal tendons appear within normal limits.  Posteromedial tendons and anterior tendons are normal. Lateral stabilizing ligaments and deltoid ligament appear within normal limits.  IMPRESSION: Lateral malleolar ulcer and osteomyelitis of the tip of the lateral malleolus.   Original Report Authenticated By: Andreas Newport, M.D.    Dg Abd Portable 1v  05/05/2012  *RADIOLOGY REPORT*  Clinical Data: Recheck tube placement.  PORTABLE ABDOMEN - 1 VIEW  Comparison: Earlier today at 1358 hours.  Findings: 1505 hours.  Nasogastric tube again looped in the stomach.  The tip is no longer identified, above the superior aspect of the film.  Bilateral airspace opacities.  No gross bowel obstruction or free intraperitoneal air.  IMPRESSION: Nasogastric tube again looped in the stomach with  tip above the imaged lower thoracic esophagus.   Original Report Authenticated By: Consuello Bossier, M.D.    Dg Abd Portable 1v  05/05/2012  *RADIOLOGY REPORT*  Clinical Data: Nasogastric tube placement  PORTABLE ABDOMEN - 1 VIEW  Comparison: 05/03/2012  Findings: The nasogastric tube   loops just beyond the GE junction, with  the tip and side holes in the distal esophagus.  Residual contrast material noted in the colon.  IMPRESSION:  1.  Malpositioned nasogastric tube, looped back into the distal esophagus.   Original Report Authenticated By: Osa Craver, M.D.    Dg Naso G Tube Plc W/fl W/rad  05/03/2012  *RADIOLOGY REPORT*  Clinical Data:  Dysphagia.  NG tube placement.  Fluoroscopic guided NG tube placement.  Comparison:  Abdominal film 05/03/2012.  Fluoroscopy Time:  0.15 minutes.  Technique:  A nasogastric tube was placed via of the left nostril down into the stomach.  The tip is in the antral region.  The patient tolerated the procedure well.  IMPRESSION: NG tube placed into the stomach.  The tip is in the antral region.   Original Report Authenticated By: P. Loralie Champagne, M.D.       Assessment/Plan: [name] is a 78 y.o. male with admission with diabetic ketoacidosis and dictation concerning for sepsis who is managing broad-spectrum antibiotics, and has received more than adequate coverage for community acquired or even healthcare associated pneumonia or cellulitis who has had persistent leukocytosis while being a patient on the physical medicine and rehabilitation service. MRI done today shows osteomyelitis of his right malleolus which may partially explain his leukocytosis. He has also been on high dose prednisone  1) Osteomyelitis: --I. Would consult orthopedic surgery to see if the patient might require aggressive surgery such as an amputation vs aggressive debridemtn of the  area of osteomyelitis  for therapeutic and diagnostic reasons and it material could be sent for  culture.  --I would not start antibiotics again without DEEP bone cultures to guide therapy. The Staph aureus growing from his superficial culture early could be a culprit pathogen but I would prefer deep tissue cultures  #2 leukocytosis: Likely a combination of the patient's the myelitis and his corticosteroid therapy. Patient is weak and deconditioned and certainly at risk for aspiration pneumonia though I am unimpressed for that diagnosis at this point in time. ---receck CBC tomorrow  LOS: 9 days   Acey Lav 05/05/2012, 4:09 PM

## 2012-05-05 NOTE — Progress Notes (Signed)
Physical Therapy Note  Patient Details  Name: [name] MRN: [id number] Date of Birth: November 14, 1928 Today's Date: 05/05/2012  Time:  1030- 1115  (45 min) Pain:  None Individual Therapy  Pt. Did not want to get OOB for the first 10 minutes, but after maximum encouragement plus explanation of the benefits, he agreed.  Pt came to EOB with minimal assist.  He sat EOB  With close supervision.  He did not want to do any bathing or dressing or functional activity.  Performed transfer from bed to recliner with minimal assist.  During set up for transfer, pt either sat on or pulled out NG tube.  Had instructed pt not to touch any tubes during the set up process.  Informed nurse.  Applied safety belt and wrist restraints and left pt in recliner with legs elevated   Humberto Seals 05/05/2012, 6:47 PM

## 2012-05-05 NOTE — Progress Notes (Signed)
Patient ID: [name], male   DOB: [birth year], 78 y.o.   MRN: [id number] Patient ID: [name], male   DOB: November 24, 1928, 78 y.o.   MRN: [id number] Subjective/Complaints: 9/1.   No complaints. Uneventful night. Evaluated by infectious disease yesterday A 12 point review of systems has been performed and if not noted above is otherwise negative.  ENT unremarkable -nasal cannula oxygen and nasogastric feeding tube in place. Chest clear anterolaterally. Cardiovascular normal heart sounds no tachycardia. Abdomen soft active bowel sounds. Extremities no edema  Will obtain orthopedic consultation due to  osteomyelitis of the lateral malleolus.  CBG (last 3)   Basename 05/05/12 0735 05/05/12 0419 05/05/12 0004  GLUCAP 165* 239* 271*     BP Readings from Last 3 Encounters:  05/05/12 133/52  04/26/12 123/54  04/26/12 123/54   Objective: Vital Signs: Blood pressure 133/52, pulse 82, temperature 98.5 F (36.9 C), temperature source Oral, resp. rate 18, weight 64.365 kg (141 lb 14.4 oz), SpO2 90.00%. Dg Abd 1 View  05/03/2012  *RADIOLOGY REPORT*  Clinical Data: Nasogastric tube replacement.  ABDOMEN - 1 VIEW  Comparison: One-view abdomen x-ray yesterday and dating back to 04/30/2012.  Findings: Nasogastric tube is looped in the distal esophagus, and its tip is not included on the image.  Visualized bowel gas pattern unremarkable.  Residual barium and stool throughout the colon as noted on multiple recent examinations.  IMPRESSION: Nasogastric tube looped in the distal esophagus and its tip is not included on the image.  This needs to be withdrawn and replaced.  Critical Value/emergent results were called by telephone at the time of interpretation on 05/03/2012 at 1440 hours to [facility], the nurse caring for the patient on the 4000 unit, who verbally acknowledged these results.   Original Report Authenticated By: Arnell Sieving, M.D.    Mr Ankle Right W Wo Contrast  05/04/2012  *RADIOLOGY REPORT*   Clinical Data: Right lateral ankle wound.  Osteomyelitis. Ulceration of the lateral ankle.  MRI OF THE RIGHT ANKLE WITHOUT AND WITH CONTRAST  Technique:  Multiplanar, multisequence MR imaging was performed both before and after administration of intravenous contrast.  Contrast: 15mL MULTIHANCE GADOBENATE DIMEGLUMINE 529 MG/ML IV SOLN  Comparison: Radiographs 05/03/2012.  Findings: There is an ulcer overlying the lateral malleolus.  There is adjacent osteomyelitis of the lateral malleolus with bone marrow edema, effacement of normal marrow, cortical osteolysis and post- gadolinium enhancement.  There is no soft tissue abscess.  Diffuse subcutaneous edema is present, lateral predominant which may represent cellulitis.  Fatty atrophy of the distal leg musculature is present.  Subtalar osteoarthritis incidentally noted.  The Achilles tendon is intact.  Peroneal tendons appear within normal limits.  Posteromedial tendons and anterior tendons are normal. Lateral stabilizing ligaments and deltoid ligament appear within normal limits.  IMPRESSION: Lateral malleolar ulcer and osteomyelitis of the tip of the lateral malleolus.   Original Report Authenticated By: Andreas Newport, M.D.    Dg Naso G Tube Plc W/fl W/rad  05/03/2012  *RADIOLOGY REPORT*  Clinical Data:  Dysphagia.  NG tube placement.  Fluoroscopic guided NG tube placement.  Comparison:  Abdominal film 05/03/2012.  Fluoroscopy Time:  0.15 minutes.  Technique:  A nasogastric tube was placed via of the left nostril down into the stomach.  The tip is in the antral region.  The patient tolerated the procedure well.  IMPRESSION: NG tube placed into the stomach.  The tip is in the antral region.   Original Report Authenticated By: P. MARK GALLERANI,  M.D.     [facility] 05/04/12 0500 05/03/12 1707  WBC 38.1* 42.3*  HGB 8.0* 8.3*  HCT 24.5* 25.6*  PLT 482* 495*    Basename 05/04/12 0500 05/03/12 0655  NA 129* 129*  K 4.6 4.6  CL 91* 90*  CO2 32 32  GLUCOSE  126* 115*  BUN 26* 25*  CREATININE 0.70 0.74  CALCIUM 8.5 8.4   CBG (last 3)   Basename 05/05/12 0735 05/05/12 0419 05/05/12 0004  GLUCAP 165* 239* 271*    Wt Readings from Last 3 Encounters:  05/01/12 64.365 kg (141 lb 14.4 oz)  04/26/12 65 kg (143 lb 4.8 oz)  04/26/12 65 kg (143 lb 4.8 oz)    Physical Exam:  HENT:  Head: Normocephalic.  NGT in place but appears to be pulled out 8 inches or so. Missing many teeth. Mucosa pink but with white film over tongue and pharynx. Phonation better Eyes:  Pupils round and reactive to light  Neck: Neck supple. No thyromegaly present.  Cardiovascular: Regular rhythm. Normal rate Pulmonary/Chest: Breath sounds normal. No respiratory distress. He has no wheezes. Occasional upper airway sounds heard as well as rhonchi Abdominal: Bowel sounds are normal. He exhibits no distension.  Neurological: He is alert.  Patient is a poor historian. He was able to give his date of birth and age.   He would follow simple commands. limited insight and awareness. He had difficulty controlling his secretions and a weak cough. Strength was symmetrical but he was weak more proximally than distally (2-3 prox to 4 distally). No focal sensory loss was discerned.  Skin:  Skin: 4cm lateral mallelolus wound with fibronecrotic tissue which is sloughing. Buttock areas with fibronecrotic tissue also (x 2) about 1-2cm in diameter Psychiatric:  Patient with improved awareness and insight   Assessment/Plan: 1. Functional deficits secondary to sepsis/deconditioning which require 3+ hours per day of interdisciplinary therapy in a comprehensive inpatient rehab setting. Physiatrist is providing close team supervision and 24 hour management of active medical problems listed below. Physiatrist and rehab team continue to assess barriers to discharge/monitor patient progress toward functional and medical goals.  The patient requires restraints due to pulling out ngt  FIM: FIM -  Bathing Bathing Steps Patient Completed: Chest;Right Arm;Left Arm;Abdomen;Front perineal area;Buttocks;Right upper leg;Left upper leg;Right lower leg (including foot);Left lower leg (including foot) Bathing: 4: Steadying assist  FIM - Upper Body Dressing/Undressing Upper body dressing/undressing steps patient completed: Thread/unthread right sleeve of pullover shirt/dresss;Thread/unthread left sleeve of pullover shirt/dress;Put head through opening of pull over shirt/dress;Pull shirt over trunk Upper body dressing/undressing: 5: Supervision: Safety issues/verbal cues FIM - Lower Body Dressing/Undressing Lower body dressing/undressing steps patient completed: Thread/unthread right pants leg;Pull pants up/down Lower body dressing/undressing: 2: Max-Patient completed 25-49% of tasks  FIM - Toileting Toileting: 0: Activity did not occur  FIM - Diplomatic Services operational officer Devices: Grab bars;Elevated toilet seat Toilet Transfers: 0-Activity did not occur  FIM - Banker Devices: Therapist, occupational: 4: Supine > Sit: Min A (steadying Pt. > 75%/lift 1 leg);4: Sit > Supine: Min A (steadying pt. > 75%/lift 1 leg);4: Bed > Chair or W/C: Min A (steadying Pt. > 75%);4: Chair or W/C > Bed: Min A (steadying Pt. > 75%)  FIM - Locomotion: Wheelchair Distance: 60' Locomotion: Wheelchair: 0: Activity did not occur FIM - Locomotion: Ambulation Locomotion: Ambulation Assistive Devices: Designer, industrial/product Ambulation/Gait Assistance: 4: Min assist Locomotion: Ambulation: 1: Travels less than 50 ft with minimal assistance (Pt.>75%)  Comprehension Comprehension Mode: Auditory Comprehension: 5-Follows basic conversation/direction: With no assist  Expression Expression Mode: Verbal Expression: 5-Expresses basic needs/ideas: With extra time/assistive device  Social Interaction Social Interaction: 5-Interacts appropriately 90% of the time - Needs  monitoring or encouragement for participation or interaction.  Problem Solving Problem Solving: 4-Solves basic 75 - 89% of the time/requires cueing 10 - 24% of the time  Memory Memory: 4-Recognizes or recalls 75 - 89% of the time/requires cueing 10 - 24% of the time Medical Problem List and Plan:  1. deconditioning related to sepsis multiple medical complications/metabolic encephalopathy/respiratory failure. Will discuss with critical care medicine on weaning of prednisone  2. DVT Prophylaxis/Anticoagulation: Subcutaneous heparin. Monitor platelet counts and any signs of bleeding  3. Pain Management: Lortab as needed. Monitor with increased mobility  4. Neuropsych: This patient is not capable of making decisions on his/her own behalf.  5. Dysphagia. Continue nasogastric tubes for now for nutritional support. Will replace today  -phonation is better and throat is less painful  -repeat MBS next week  -needs wrist restraints to keep from pulling ngt-tube is somewhat pulled out again 6.NSTEMI. Followup per cardiology services. Continue aspirin therapy as well as Lopressor 12.5 mg twice a day (hold today). No current chest pain or shortness of breath  7. Esophageal candidiasis. Diflucan x7 days. Sx improving 8. Diabetes mellitus with peripheral neuropathy. Hemoglobin A1c 10.5. Lantus insulin 15 units at bedtime. added am lantus as well to better cover pm cbgs.  Check blood sugars a.c. and at bedtime   -continue to titrate further as indicated 9. Right lateral malleolus on left lower buttocks decubitus ulcers. Santyl for right lateral malleolus breakdown 10. Anemia. Latest hemoglobin 7.8. Monitor for a bleeding episodes and followup CBC with plan to transfuse if symptomatic  11. Hypothyroidism. Synthroid  12. Medical noncompliance. Will discuss at length with patient and family the need to maintain medical regimen 13. Leukocytosis: wbc's continue to rise. He has been on chronic prednisone   -cxr  with multifocal conslidations which are likely infection vs edema.   -changed abx to avelox  -recheck labs today. He remains afebrile  -will continue diflucan via tube for now given that there is still visible thrush on tongue,mouth  -culture right lateral malleolus wound and check xrays of ankle also  -will ask for an ID consult 14. Hyponatremia:  -f/u labs today to determine plan.  -may need to back off free h2o a bit  LOS (Days) 9 A FACE TO FACE EVALUATION WAS PERFORMED  Rogelia Boga 05/05/2012, 8:37 AM

## 2012-05-05 NOTE — Progress Notes (Signed)
Patient tube came out during a therapy session late this morning. Dr Posey Rea called and new orders were received. First NG tube placed with Corazon RN assistance about 1300-1330. Xray showed tube curled up in esophagus. Dr Posey Rea called and new orders were received. Tubing was removed from the left nare and attempted to reinsert NG tube into right nare with second RN assistance about 1445. Second xray revealed tubing was still looped in the esophagus. Dr Posey Rea called and new orders were received All oral meds held. Dr Posey Rea reported to hold Lantus but give sliding scale. Patient did not received sliding scale this evening due to the IV fluids not being started yet. IV team came up to get site for IV fluids. Old site was appeared occluded then infiltrated when tried to flush again. After infusion ran in new IV the IV site appeared cool and swollen. IV removed and IV team called again. Patient reported no pain. Awaiting new IV. Family voiced concerns in regard to not being able to talk with the doctor during the week and being kept up to date. Reassured patient's family of patient's care and will relay message in report for MD to contact patient's daughter before possibly reinserting tube tomorrow. Patient's vital signs stable. Continue with plan of care.

## 2012-05-06 ENCOUNTER — Inpatient Hospital Stay (HOSPITAL_COMMUNITY): Payer: Medicare Other

## 2012-05-06 ENCOUNTER — Inpatient Hospital Stay (HOSPITAL_COMMUNITY): Payer: Medicare Other | Admitting: Physical Therapy

## 2012-05-06 ENCOUNTER — Inpatient Hospital Stay (HOSPITAL_COMMUNITY): Payer: Medicare Other | Admitting: Speech Pathology

## 2012-05-06 DIAGNOSIS — Z5189 Encounter for other specified aftercare: Secondary | ICD-10-CM

## 2012-05-06 DIAGNOSIS — R1310 Dysphagia, unspecified: Secondary | ICD-10-CM

## 2012-05-06 DIAGNOSIS — A4189 Other specified sepsis: Secondary | ICD-10-CM

## 2012-05-06 DIAGNOSIS — B3781 Candidal esophagitis: Secondary | ICD-10-CM

## 2012-05-06 DIAGNOSIS — R5381 Other malaise: Secondary | ICD-10-CM

## 2012-05-06 LAB — GLUCOSE, CAPILLARY
Glucose-Capillary: 172 mg/dL — ABNORMAL HIGH (ref 70–99)
Glucose-Capillary: 213 mg/dL — ABNORMAL HIGH (ref 70–99)
Glucose-Capillary: 272 mg/dL — ABNORMAL HIGH (ref 70–99)

## 2012-05-06 LAB — CBC WITH DIFFERENTIAL/PLATELET
Basophils Relative: 0 % (ref 0–1)
Eosinophils Absolute: 0 10*3/uL (ref 0.0–0.7)
Eosinophils Relative: 0 % (ref 0–5)
HCT: 25 % — ABNORMAL LOW (ref 39.0–52.0)
Hemoglobin: 7.9 g/dL — ABNORMAL LOW (ref 13.0–17.0)
MCH: 31 pg (ref 26.0–34.0)
MCHC: 31.6 g/dL (ref 30.0–36.0)
MCV: 98 fL (ref 78.0–100.0)
Monocytes Absolute: 1.4 10*3/uL — ABNORMAL HIGH (ref 0.1–1.0)
Neutro Abs: 30.9 10*3/uL — ABNORMAL HIGH (ref 1.7–7.7)
Neutrophils Relative %: 91 % — ABNORMAL HIGH (ref 43–77)
RBC: 2.55 MIL/uL — ABNORMAL LOW (ref 4.22–5.81)

## 2012-05-06 LAB — BASIC METABOLIC PANEL
BUN: 22 mg/dL (ref 6–23)
CO2: 32 mEq/L (ref 19–32)
Calcium: 8.9 mg/dL (ref 8.4–10.5)
GFR calc non Af Amer: 88 mL/min — ABNORMAL LOW (ref >90)
Glucose, Bld: 115 mg/dL — ABNORMAL HIGH (ref 70–99)
Sodium: 131 mEq/L — ABNORMAL LOW (ref 135–145)

## 2012-05-06 LAB — URINE CULTURE

## 2012-05-06 LAB — WOUND CULTURE

## 2012-05-06 MED ORDER — RANITIDINE NICU IV SYRINGE 25 MG/ML: 1.0000 mg/kg | INJECTION | Freq: Two times a day (BID) | INTRAMUSCULAR | Status: DC

## 2012-05-06 MED ORDER — SULFAMETHOXAZOLE-TRIMETHOPRIM 400-80 MG PO TABS
1.0000 | ORAL_TABLET | Freq: Two times a day (BID) | ORAL | Status: DC
  Filled 2012-05-06 (×3): qty 1

## 2012-05-06 MED ORDER — METHYLPREDNISOLONE SODIUM SUCC 40 MG IJ SOLR: 20.0000 mg | Freq: Every day | INTRAMUSCULAR | Status: DC

## 2012-05-06 MED ORDER — METHYLPREDNISOLONE SODIUM SUCC 40 MG IJ SOLR: 30.0000 mg | Freq: Every day | INTRAMUSCULAR | Status: DC

## 2012-05-06 MED ORDER — METHYLPREDNISOLONE SODIUM SUCC 40 MG IJ SOLR
40.0000 mg | Freq: Every day | INTRAMUSCULAR | Status: DC
  Administered 2012-05-06: 40 mg via INTRAVENOUS
  Filled 2012-05-06 (×3): qty 1

## 2012-05-06 MED ORDER — SODIUM CHLORIDE 0.9 % IJ SOLN
10.0000 mL | INTRAMUSCULAR | Status: DC | PRN
  Administered 2012-05-08 – 2012-05-13 (×3): 10 mL

## 2012-05-06 MED ORDER — SODIUM CHLORIDE 0.9 % IV SOLN
INTRAVENOUS | Status: DC
  Administered 2012-05-06: 13:00:00 via INTRAVENOUS
  Administered 2012-05-07: 75 mL/h via INTRAVENOUS

## 2012-05-06 MED ORDER — FAMOTIDINE IN NACL 20-0.9 MG/50ML-% IV SOLN
20.0000 mg | Freq: Two times a day (BID) | INTRAVENOUS | Status: DC
  Administered 2012-05-06: 20 mg via INTRAVENOUS
  Filled 2012-05-06 (×3): qty 50

## 2012-05-06 MED ORDER — LEVOTHYROXINE SODIUM 100 MCG IV SOLR: 75.0000 ug | Freq: Every day | INTRAVENOUS | Status: DC

## 2012-05-06 MED ORDER — LEVOTHYROXINE SODIUM 100 MCG IV SOLR
37.5000 ug | Freq: Every day | INTRAVENOUS | Status: DC
  Administered 2012-05-06: 38 ug via INTRAVENOUS
  Filled 2012-05-06 (×8): qty 1.9

## 2012-05-06 MED ORDER — METHYLPREDNISOLONE SODIUM SUCC 40 MG IJ SOLR: 10.0000 mg | Freq: Every day | INTRAMUSCULAR | Status: DC

## 2012-05-06 NOTE — Progress Notes (Signed)
Speech Language Pathology Session & Weekly Progress Notes  Patient Details  Name: Tyreese Thain MRN: [id number] Date of Birth: [birth year]  Today's Date: 05/06/2012 Time: 1415-1510 Time Calculation (min): 55 min  Short Term Goals: Week 1: SLP Short Term Goal 1 (Week 1): Pt will consume trials of thin liquids via spoon without overt s/s of aspiration with 75% of trials SLP Short Term Goal 1 - Progress (Week 1): Met SLP Short Term Goal 2 (Week 1): Pt will consume trials of puree textures without overt s/s of aspiration with 75% of trials. SLP Short Term Goal 2 - Progress (Week 1): Met SLP Short Term Goal 3 (Week 1): Pt will recall new, daily information with supervision verbal and visual cues.  SLP Short Term Goal 3 - Progress (Week 1): Not met  New Short Term Goals Week 2: SLP Short Term Goal 1 (Week 2): Pt will participate in objective swallow study to asess dysphagia   Weekly Progress Updates: Pt has made functional gains and has met 2 out of 3 STG's this reporting period. Currently, pt is NPO but is consuming trials of thin liquids and puree textures without overt s/s of aspiration with Mod-Max verbal cues for utilization of swallowing compensatory strategies of effortful swallows and multiple swallows. Pt appears to have increase swallow initiation and increased hyolaryngeal excursion and elevation and will have a f/u MBSS to assess dysphagia and overall functional change.    SLP Frequency: 1-2 X/day, 30-60 minutes Estimated Length of Stay: 2 weeks SLP Treatment/Interventions: Cognitive remediation/compensation;Cueing hierarchy;Functional tasks;Internal/external aids;Dysphagia/aspiration precaution training;Environmental controls;Patient/family education;Therapeutic Activities;Therapeutic Exercise  Daily Session Skilled Therapeutic Intervention: Treatment focus on utilization of swallowing compensatory strategies with trials of thin and puree textures. Pt utilized effortful swallows and  multiple swallows with all trials, however, demonstrated delayed cough with 50% of trials. Suspect delayed cough may be due to sensed esophageal backflow with eventual penetration. Pt demonstrating a weak, nonproductive cough throughout session. Pt reports he has had increased coughing throughout today.  FIM:  Comprehension Comprehension Mode: Auditory Comprehension: 5-Understands basic 90% of the time/requires cueing < 10% of the time Expression Expression Mode: Verbal Expression: 5-Expresses basic 90% of the time/requires cueing < 10% of the time. Social Interaction Social Interaction: 5-Interacts appropriately 90% of the time - Needs monitoring or encouragement for participation or interaction. Problem Solving Problem Solving: 4-Solves basic 75 - 89% of the time/requires cueing 10 - 24% of the time Memory Memory: 4-Recognizes or recalls 75 - 89% of the time/requires cueing 10 - 24% of the time FIM - Eating Eating Activity: 0: Activity did not occur Pain Pain Assessment Pain Assessment: No/denies pain  Therapy/Group: Individual Therapy  Deshae Dickison 05/06/2012, 4:09 PM

## 2012-05-06 NOTE — Progress Notes (Signed)
Social Work Patient ID: [name], male   DOB: 1929-04-03, 78 y.o.   MRN: [id number] Insurance update sent to [facility] case Production designer, theatre/television/film.

## 2012-05-06 NOTE — Progress Notes (Signed)
Patient ID: [name], male   DOB: 06-25-1929, 78 y.o.   MRN: [id number] Reviewed MRI and xrays consistant with osteo of fibula at site of ulcer. Dr. Lajoyce Corners to see in AM.

## 2012-05-06 NOTE — Progress Notes (Signed)
Patient ID: [name], male   DOB: 1928-11-07, 78 y.o.   MRN: [id number] Subjective/Complaints: No problems last night. Slept well. No shortness of breath, only occasional cough. Denies pain. Daughter with multiple questions.  NG tube in and out several times, requiring fluoro placement No Swallow eval recently Appreciate ID consult A 12 point review of systems has been performed and if not noted above is otherwise negative.   Objective: Vital Signs: Blood pressure 150/75, pulse 67, temperature 97.7 F (36.5 C), temperature source Oral, resp. rate 20, weight 64.365 kg (141 lb 14.4 oz), SpO2 98.00%. Mr Ankle Right W Wo Contrast  05/04/2012  *RADIOLOGY REPORT*  Clinical Data: Right lateral ankle wound.  Osteomyelitis. Ulceration of the lateral ankle.  MRI OF THE RIGHT ANKLE WITHOUT AND WITH CONTRAST  Technique:  Multiplanar, multisequence MR imaging was performed both before and after administration of intravenous contrast.  Contrast: 15mL MULTIHANCE GADOBENATE DIMEGLUMINE 529 MG/ML IV SOLN  Comparison: Radiographs 05/03/2012.  Findings: There is an ulcer overlying the lateral malleolus.  There is adjacent osteomyelitis of the lateral malleolus with bone marrow edema, effacement of normal marrow, cortical osteolysis and post- gadolinium enhancement.  There is no soft tissue abscess.  Diffuse subcutaneous edema is present, lateral predominant which may represent cellulitis.  Fatty atrophy of the distal leg musculature is present.  Subtalar osteoarthritis incidentally noted.  The Achilles tendon is intact.  Peroneal tendons appear within normal limits.  Posteromedial tendons and anterior tendons are normal. Lateral stabilizing ligaments and deltoid ligament appear within normal limits.  IMPRESSION: Lateral malleolar ulcer and osteomyelitis of the tip of the lateral malleolus.   Original Report Authenticated By: Andreas Newport, M.D.    Dg Abd Portable 1v  05/05/2012  *RADIOLOGY REPORT*  Clinical Data:  Recheck tube placement.  PORTABLE ABDOMEN - 1 VIEW  Comparison: Earlier today at 1358 hours.  Findings: 1505 hours.  Nasogastric tube again looped in the stomach.  The tip is no longer identified, above the superior aspect of the film.  Bilateral airspace opacities.  No gross bowel obstruction or free intraperitoneal air.  IMPRESSION: Nasogastric tube again looped in the stomach with tip above the imaged lower thoracic esophagus.   Original Report Authenticated By: Consuello Bossier, M.D.    Dg Abd Portable 1v  05/05/2012  *RADIOLOGY REPORT*  Clinical Data: Nasogastric tube placement  PORTABLE ABDOMEN - 1 VIEW  Comparison: 05/03/2012  Findings: The nasogastric tube   loops just beyond the GE junction, with the tip and side holes in the distal esophagus.  Residual contrast material noted in the colon.  IMPRESSION:  1.  Malpositioned nasogastric tube, looped back into the distal esophagus.   Original Report Authenticated By: Osa Craver, M.D.     Basename 05/04/12 0500 05/03/12 1707  WBC 38.1* 42.3*  HGB 8.0* 8.3*  HCT 24.5* 25.6*  PLT 482* 495*    Basename 05/04/12 0500  NA 129*  K 4.6  CL 91*  CO2 32  GLUCOSE 126*  BUN 26*  CREATININE 0.70  CALCIUM 8.5   CBG (last 3)   Basename 05/06/12 0754 05/06/12 0409 05/06/12 0002  GLUCAP 130* 172* 213*    Wt Readings from Last 3 Encounters:  05/01/12 64.365 kg (141 lb 14.4 oz)  04/26/12 65 kg (143 lb 4.8 oz)  04/26/12 65 kg (143 lb 4.8 oz)    Physical Exam:  HENT:  Head: Normocephalic.  NGT in place but appears to be pulled out 8 inches or so. Missing  many teeth. Mucosa pink but with white film over tongue and pharynx. Phonation better Eyes:  Pupils round and reactive to light  Neck: Neck supple. No thyromegaly present.  Cardiovascular: Regular rhythm. Normal rate Pulmonary/Chest: Breath sounds normal. No respiratory distress. He has no wheezes. Occasional upper airway sounds heard as well as rhonchi Abdominal: Bowel sounds are  normal. He exhibits no distension.  Neurological: He is alert.  Patient is a poor historian. He was able to give his date of birth and age.   He would follow simple commands. limited insight and awareness. He had difficulty controlling his secretions and a weak cough. Strength was symmetrical but he was weak more proximally than distally (2-3 prox to 4 distally). No focal sensory loss was discerned.  Skin:  Skin: 4cm lateral mallelolus wound with fibronecrotic tissue which is sloughing. Buttock areas with fibronecrotic tissue also (x 2) about 1-2cm in diameter Psychiatric:  Patient with improved awareness and insight   Assessment/Plan: 1. Functional deficits secondary to sepsis/deconditioning which require 3+ hours per day of interdisciplinary therapy in a comprehensive inpatient rehab setting. Physiatrist is providing close team supervision and 24 hour management of active medical problems listed below. Physiatrist and rehab team continue to assess barriers to discharge/monitor patient progress toward functional and medical goals.  The patient requires restraints due to pulling out ngt  FIM: FIM - Bathing Bathing Steps Patient Completed: Chest;Right Arm;Left Arm;Abdomen;Front perineal area;Buttocks;Right upper leg;Left upper leg;Right lower leg (including foot);Left lower leg (including foot) Bathing: 4: Steadying assist  FIM - Upper Body Dressing/Undressing Upper body dressing/undressing steps patient completed: Thread/unthread right sleeve of pullover shirt/dresss;Thread/unthread left sleeve of pullover shirt/dress;Put head through opening of pull over shirt/dress;Pull shirt over trunk Upper body dressing/undressing: 5: Supervision: Safety issues/verbal cues FIM - Lower Body Dressing/Undressing Lower body dressing/undressing steps patient completed: Thread/unthread right pants leg;Pull pants up/down Lower body dressing/undressing: 2: Max-Patient completed 25-49% of tasks  FIM -  Toileting Toileting: 0: Activity did not occur  FIM - Diplomatic Services operational officer Devices: Grab bars;Elevated toilet seat Toilet Transfers: 0-Activity did not occur  FIM - Banker Devices: Bed rails Bed/Chair Transfer: 3: Bed > Chair or W/C: Mod A (lift or lower assist);3: Chair or W/C > Bed: Mod A (lift or lower assist)  FIM - Locomotion: Wheelchair Distance: 60' Locomotion: Wheelchair: 0: Activity did not occur FIM - Locomotion: Ambulation Locomotion: Ambulation Assistive Devices: Designer, industrial/product Ambulation/Gait Assistance: 4: Min assist Locomotion: Ambulation: 1: Travels less than 50 ft with minimal assistance (Pt.>75%)  Comprehension Comprehension Mode: Auditory Comprehension: 5-Follows basic conversation/direction: With no assist  Expression Expression Mode: Verbal Expression: 5-Expresses basic needs/ideas: With extra time/assistive device  Social Interaction Social Interaction: 5-Interacts appropriately 90% of the time - Needs monitoring or encouragement for participation or interaction.  Problem Solving Problem Solving: 4-Solves basic 75 - 89% of the time/requires cueing 10 - 24% of the time  Memory Memory: 4-Recognizes or recalls 75 - 89% of the time/requires cueing 10 - 24% of the time Medical Problem List and Plan:  1. deconditioning related to sepsis multiple medical complications/metabolic encephalopathy/respiratory failure. Will discuss with critical care medicine on weaning of prednisone  2. DVT Prophylaxis/Anticoagulation: Subcutaneous heparin. Monitor platelet counts and any signs of bleeding  3. Pain Management: Lortab as needed. Monitor with increased mobility  4. Neuropsych: This patient is not capable of making decisions on his/her own behalf.  5. Dysphagia. Hold nasogastric tubes for now for nutritional support. IVF today  -  phonation is better and throat is less painful  -repeat MBS next  week  - 6.NSTEMI. Followup per cardiology services. Continue aspirin therapy as well as Lopressor 12.5 mg twice a day (hold today). No current chest pain or shortness of breath  7. Esophageal candidiasis. Diflucan x7 days. Sx improving 8. Diabetes mellitus with peripheral neuropathy. Hemoglobin A1c 10.5. Lantus insulin 15 units at bedtime. added am lantus as well to better cover pm cbgs.  Check blood sugars a.c. and at bedtime   -continue to titrate further as indicated 9. Right lateral malleolus osteomyelitis ID recommends Ortho consult 10. Anemia. Latest hemoglobin 8. Monitor for a bleeding episodes and followup CBC with plan to transfuse if symptomatic  11. Hypothyroidism. Synthroid  12. Medical noncompliance. Will discuss at length with patient and family the need to maintain medical regimen 13. Leukocytosis: wbc's continue to rise. He has been on chronic prednisone and has osteomyelitis and UTI- Since surgical cultures may be obtained will hold on antiobiotics unless pt is febrile and more symptomatic from UTI14. Hyponatremia:Will use .9 NS for fluid  -  LOS (Days) 10 A FACE TO FACE EVALUATION WAS PERFORMED  KIRSTEINS,ANDREW E 05/06/2012, 8:48 AM

## 2012-05-06 NOTE — Progress Notes (Signed)
Peripherally Inserted Central Catheter/Midline Placement  The IV Nurse has discussed with the patient and/or persons authorized to consent for the patient, the purpose of this procedure and the potential benefits and risks involved with this procedure.  The benefits include less needle sticks, lab draws from the catheter and patient may be discharged home with the catheter.  Risks include, but not limited to, infection, bleeding, blood clot (thrombus formation), and puncture of an artery; nerve damage and irregular heat beat.  Alternatives to this procedure were also discussed.  PICC/Midline Placement Documentation  PICC / Midline Single Lumen 05/06/12 PICC Right Basilic (Active)       Stacie Glaze Horton 05/06/2012, 12:47 PM

## 2012-05-06 NOTE — Progress Notes (Signed)
Physical Therapy Session Note  Patient Details  Name: [name] MRN: [id number] Date of Birth: 1929-07-12  Today's Date: 05/06/2012 Time: 5784-6962 Time Calculation (min): 30 min  Short Term Goals: Week 2:  PT Short Term Goal 1 (Week 2): Pt will perform stand pivot transfers with supervision PT Short Term Goal 2 (Week 2): Pt will tolerate standing activity for 3 min continuous.  PT Short Term Goal 3 (Week 2): Pt will verbalize importance of pressure relief and unweighting heels in bed for skin preservation.  Skilled Therapeutic Interventions/Progress Updates:    Bed mobility with supervision, increased time. Stand pivot transfer with RW with supervision, verbal cues for anterior weight shift upon standing secondary to initial posterior lean. 2 steps (6") performed with min/mod assist, pt very shaky/weak with performance. Pt has difficulty lifting bil. Feet to next step secondary to weakness. Extended rest needed during session secondary to fatigue.   Therapy Documentation Precautions:  Precautions Precautions: Fall Precaution Comments: NG tube, multiple decubitus lcers to sacrum and lower legs Required Braces or Orthoses: Other Brace/Splint Other Brace/Splint: pressure relieving heel protectors when in bed Restrictions Weight Bearing Restrictions: No Vital Signs: SpO2 88% at lowest on 2 L [place] O2 during therapy  Pain:  No c/o pain  See FIM for current functional status  Therapy/Group: Individual Therapy  [name] 05/06/2012, 5:56 PM

## 2012-05-06 NOTE — Progress Notes (Signed)
Occupational Therapy Session Note  Patient Details  Name: [name] MRN: [id number] Date of Birth: [birth year]  Today's Date: 05/06/2012 Time: 0800-0855 Time Calculation (min): 55 min  Short Term Goals: Week 2:  OT Short Term Goal 1 (Week 2): Pt will perform lower body bathing with min A. OT Short Term Goal 2 (Week 2): Pt will perform toilet transfers with min assist using RW to 3:1 OT Short Term Goal 3 (Week 2): Pt will perform toileting tasks (3/3) with steady assist. OT Short Term Goal 4 (Week 2): Pt will perform lower body dressing with min A.  Skilled Therapeutic Interventions/Progress Updates:    Pt in bed with daughter at bedside.  Pt agreed to getting OOB to complete bathing and dressing tasks seated in w/c at sink.  Pt performed supine>sit and stand pivot transfer to w/c with supervision.  Pt declined LB bathing this morning because "they just cleaned me down there."  IV site appeared swollen and RN disconnected until IV team could assess further.  Pt O2 sats >90% on 2L O2 but decreased to 85% on RA when removed to complete UB dressing tasks.  Pt performed sit<>stand at sink with supervision and was able to pull up pants while standing.  Focus on participation, activity tolerance, and standing balance.  Therapy Documentation Precautions:  Precautions Precautions: Fall Precaution Comments: NG tube, multiple decubitus lcers to sacrum and lower legs Required Braces or Orthoses: Other Brace/Splint Other Brace/Splint: pressure relieving heel protectors when in bed Restrictions Weight Bearing Restrictions: No General:   Pain: Pain Assessment Pain Assessment: No/denies pain  See FIM for current functional status  Therapy/Group: Individual Therapy  [name] 05/06/2012, 8:56 AM

## 2012-05-06 NOTE — Progress Notes (Signed)
Physical Therapy Session Note  Patient Details  Name: [name] MRN: [id number] Date of Birth: 04-02-1929  Today's Date: 05/06/2012 Time: 4098-1191 Time Calculation (min): 45 min  Short Term Goals: Week 2:  PT Short Term Goal 1 (Week 2): Pt will perform stand pivot transfers with supervision PT Short Term Goal 2 (Week 2): Pt will tolerate standing activity for 3 min continuous.  PT Short Term Goal 3 (Week 2): Pt will verbalize importance of pressure relief and unweighting heels in bed for skin preservation.  Skilled Therapeutic Interventions/Progress Updates:    Wheelchair propulsion x 100' with supervision for generalized strength and conditioning. Standing activity tolerance and balance with one UE support while playing bowling game on Wii (improved participation). Gait training x 50' with RW with min-guard assist. Extended rest breaks needed secondary to decreased endurance.   Therapy Documentation Precautions:  Precautions Precautions: Fall Precaution Comments: NG tube, multiple decubitus lcers to sacrum and lower legs Required Braces or Orthoses: Other Brace/Splint Other Brace/Splint: pressure relieving heel protectors when in bed Restrictions Weight Bearing Restrictions: No  Pain: Pain Assessment Pain Assessment: No/denies pain    See FIM for current functional status  Therapy/Group: Individual Therapy  [name] 05/06/2012, 12:19 PM

## 2012-05-07 ENCOUNTER — Inpatient Hospital Stay (HOSPITAL_COMMUNITY): Payer: Medicare Other

## 2012-05-07 ENCOUNTER — Inpatient Hospital Stay (HOSPITAL_COMMUNITY): Payer: Medicare Other | Admitting: Speech Pathology

## 2012-05-07 ENCOUNTER — Inpatient Hospital Stay (HOSPITAL_COMMUNITY): Payer: Medicare Other | Admitting: Physical Therapy

## 2012-05-07 DIAGNOSIS — A4189 Other specified sepsis: Secondary | ICD-10-CM

## 2012-05-07 DIAGNOSIS — R1310 Dysphagia, unspecified: Secondary | ICD-10-CM

## 2012-05-07 DIAGNOSIS — R5381 Other malaise: Secondary | ICD-10-CM

## 2012-05-07 DIAGNOSIS — B3781 Candidal esophagitis: Secondary | ICD-10-CM

## 2012-05-07 DIAGNOSIS — Z5189 Encounter for other specified aftercare: Secondary | ICD-10-CM

## 2012-05-07 DIAGNOSIS — R413 Other amnesia: Secondary | ICD-10-CM

## 2012-05-07 LAB — GLUCOSE, CAPILLARY
Glucose-Capillary: 133 mg/dL — ABNORMAL HIGH (ref 70–99)
Glucose-Capillary: 158 mg/dL — ABNORMAL HIGH (ref 70–99)
Glucose-Capillary: 301 mg/dL — ABNORMAL HIGH (ref 70–99)

## 2012-05-07 MED ORDER — LEVOTHYROXINE SODIUM 75 MCG PO TABS
75.0000 ug | ORAL_TABLET | Freq: Every day | ORAL | Status: DC
  Administered 2012-05-08 – 2012-05-16 (×9): 75 ug via ORAL
  Filled 2012-05-07 (×10): qty 1

## 2012-05-07 MED ORDER — FERROUS SULFATE 325 (65 FE) MG PO TABS
325.0000 mg | ORAL_TABLET | Freq: Three times a day (TID) | ORAL | Status: DC
  Administered 2012-05-07 – 2012-05-15 (×24): 325 mg via ORAL
  Filled 2012-05-07 (×30): qty 1

## 2012-05-07 MED ORDER — NYSTATIN 100000 UNIT/ML MT SUSP
5.0000 mL | Freq: Four times a day (QID) | OROMUCOSAL | Status: DC
  Administered 2012-05-07 – 2012-05-15 (×34): 500000 [IU] via OROMUCOSAL
  Filled 2012-05-07 (×42): qty 5

## 2012-05-07 MED ORDER — METOPROLOL TARTRATE 12.5 MG HALF TABLET
12.5000 mg | ORAL_TABLET | Freq: Two times a day (BID) | ORAL | Status: DC
  Administered 2012-05-07 – 2012-05-15 (×17): 12.5 mg via ORAL
  Filled 2012-05-07 (×21): qty 1

## 2012-05-07 MED ORDER — PREDNISONE 20 MG PO TABS: 30.0000 mg | ORAL_TABLET | Freq: Every day | ORAL | Status: DC

## 2012-05-07 MED ORDER — PREDNISONE 20 MG PO TABS
40.0000 mg | ORAL_TABLET | Freq: Every day | ORAL | Status: DC
  Administered 2012-05-08: 40 mg via ORAL
  Filled 2012-05-07 (×2): qty 2

## 2012-05-07 MED ORDER — HYDROCODONE-ACETAMINOPHEN 10-325 MG PO TABS: 1.0000 | ORAL_TABLET | ORAL | Status: DC | PRN

## 2012-05-07 MED ORDER — ENSURE PUDDING PO PUDG
1.0000 | Freq: Three times a day (TID) | ORAL | Status: DC
  Administered 2012-05-07 – 2012-05-16 (×25): 1 via ORAL

## 2012-05-07 MED ORDER — FAMOTIDINE 20 MG PO TABS
20.0000 mg | ORAL_TABLET | Freq: Every day | ORAL | Status: DC
  Administered 2012-05-07 – 2012-05-15 (×9): 20 mg via ORAL
  Filled 2012-05-07 (×11): qty 1

## 2012-05-07 NOTE — Progress Notes (Signed)
Nutrition Follow-up  Intervention:   1. Discontinue enteral nutrition with advancement of diet, per discussion with PA 2. Ensure Pudding po TID, each supplement provides 170 kcal and 4 grams of protein.  3. Magic cup TID with meals, each supplement provides 290 kcal and 9 grams of protein. 4. Education needs to be addressed closer to d/c. 5. RD to continue to follow  Assessment:   Pt consumed 85% of lunch. Advanced to Dysphagia 1 diet today; on nectar thickened liquids. Discussed importance of adequate intake with patient and need for consistent intake of meals to prevent reinitiation of enteral nutrition. Pt verbalized understanding and stated that he was going to do the best he could with his intake. Discussed plan with PA.  Diet Order:  NPO TF: Glucerna 1.2 at 75 ml/hr x 20 hours. 30 ml Prostat via tube daily. This regimen will provide: 1900 kcal, 105 grams protein, 1208 ml free water. Free water discontinued.  Meds: Scheduled Meds:    . aspirin  81 mg Oral Daily  . collagenase   Topical Daily  . famotidine  20 mg Oral Daily  . feeding supplement  30 mL Per Tube Daily  . ferrous sulfate  325 mg Oral TID WC  . heparin subcutaneous  5,000 Units Subcutaneous Q8H  . insulin aspart  0-20 Units Subcutaneous Q4H  . levothyroxine  75 mcg Oral QAC breakfast  . metoprolol tartrate  12.5 mg Oral BID  . nystatin  5 mL Mouth/Throat QID  . predniSONE  40 mg Oral Q breakfast  . DISCONTD: famotidine (PEPCID) IV  20 mg Intravenous Q12H  . DISCONTD: ferrous sulfate  300 mg Per Tube TID  . DISCONTD: levothyroxine  38 mcg Intravenous Daily  . DISCONTD: methylPREDNISolone (SOLU-MEDROL) injection  10 mg Intravenous Daily  . DISCONTD: methylPREDNISolone (SOLU-MEDROL) injection  20 mg Intravenous Daily  . DISCONTD: methylPREDNISolone (SOLU-MEDROL) injection  30 mg Intravenous Daily  . DISCONTD: methylPREDNISolone (SOLU-MEDROL) injection  40 mg Intravenous Daily  . DISCONTD: metoprolol tartrate  12.5  mg Per Tube BID  . DISCONTD: predniSONE  30 mg Oral Q breakfast   Continuous Infusions:    . feeding supplement (GLUCERNA 1.2 CAL) 1,000 mL (05/05/12 1051)  . DISCONTD: sodium chloride 75 mL/hr at 05/07/12 0700  . DISCONTD: dextrose 5 % and 0.9% NaCl 75 mL/hr at 05/05/12 2133   PRN Meds:.albuterol, antiseptic oral rinse, HYDROcodone-acetaminophen, ipratropium, ondansetron (ZOFRAN) IV, ondansetron, sodium chloride, sorbitol, DISCONTD: HYDROcodone-acetaminophen  Labs:  CMP     Component Value Date/Time   NA 131* 05/06/2012 0856   K 4.3 05/06/2012 0856   CL 92* 05/06/2012 0856   CO2 32 05/06/2012 0856   GLUCOSE 115* 05/06/2012 0856   BUN 22 05/06/2012 0856   CREATININE 0.66 05/06/2012 0856   CALCIUM 8.9 05/06/2012 0856   PROT 5.8* 05/03/2012 0655   ALBUMIN 1.4* 05/03/2012 0655   AST 18 05/03/2012 0655   ALT 18 05/03/2012 0655   ALKPHOS 123* 05/03/2012 0655   BILITOT 0.1* 05/03/2012 0655   GFRNONAA 88* 05/06/2012 0856   GFRAA >90 05/06/2012 0856   Sodium  Date/Time Value Range Status  05/06/2012  8:56 AM 131* 135 - 145 mEq/L Final  05/04/2012  5:00 AM 129* 135 - 145 mEq/L Final  05/03/2012  6:55 AM 129* 135 - 145 mEq/L Final    Potassium  Date/Time Value Range Status  05/06/2012  8:56 AM 4.3  3.5 - 5.1 mEq/L Final  05/04/2012  5:00 AM 4.6  3.5 - 5.1 mEq/L  Final  05/03/2012  6:55 AM 4.6  3.5 - 5.1 mEq/L Final   CBG (last 3)   Basename 05/07/12 1125 05/07/12 0738 05/07/12 0441  GLUCAP 133* 158* 281*   Lab Results  Component Value Date   HGBA1C 10.5* 04/06/2012    Intake/Output Summary (Last 24 hours) at 05/07/12 1504 Last data filed at 05/07/12 1200  Gross per 24 hour  Intake 1418.75 ml  Output    200 ml  Net 1218.75 ml  BM 9/2  Weight Status:  64.4 kg - stable  Estimated needs:  1700 - 1800 kcal, 90 - 115 grams protein  Nutrition Dx:  Inadequate oral intake r/t inability to eat AEB NPO status. Resolved.  New Nutrition Dx: Swallowing difficulty r/t dysphagia AEB need for ST and thickened  liquids.  Goal:  Pt to meet >/= 90% of their estimated nutrition needs - improving  Monitor:  weights, labs, supplement tolerance, I/O's, diet advancement   Jarold Motto MS, RD, LDN Pager: [phone] After-hours pager: [phone]

## 2012-05-07 NOTE — Procedures (Signed)
Objective Swallowing Evaluation: Modified Barium Swallowing Study  Patient Details  Name: [name] MRN: [id number] Date of Birth: Apr 05, 1929  Today's Date: 05/07/2012 Time: 4098-1191 Time Calculation (min): 45 min  Skilled Therapeutic Intervention: Administered MBSS and called pt's daughter to discuss results and recommendations.   Past Medical History:  Past Medical History  Diagnosis Date  . Diabetes mellitus   . Cancer   . Thyroid disease   . Anemia   . Hyperlipidemia   . Neuropathy, diabetic   . Diabetic retinopathy    Past Surgical History:  Past Surgical History  Procedure Date  . Cataract extraction, bilateral   . Colonoscopy w/ polypectomy   . Esophagogastroduodenoscopy 04/25/2012    Procedure: ESOPHAGOGASTRODUODENOSCOPY (EGD);  Surgeon: Meryl Dare, MD,FACG;  Location: [facility];  Service: Endoscopy;  Laterality: N/A;    Recommendation/Prognosis  Clinical Impression Dysphagia Diagnosis: Mild pharyngeal phase dysphagia;Mild cervical esophageal phase dysphagia Clinical impression: Pt's swallow has functionally improved but continues to present with a mild pharyngeal dysphagia characterized by delayed swallow initiation resulting in flash penetration of nectar thick liquids and deep penetration of thin liquids. A cued cough was unsuccessful in eliminating penetrates.  Pt also demonstrates minimal valleculae and pyiform sinus residue that cleared with cued second swallows.  Pt's overall esophageal phase appeared to be [facility] with minimal backflow noted that the pt sensed and cleared with a cued second swallow. Recommend pt initiate Dys. 1 diet with nectar-thick liquids and full supervision for utilization of strict swallowing compensatory strategies.   Swallow Evaluation Recommendations Diet Recommendations: Dysphagia 1 (Puree);Nectar-thick liquid Liquid Administration via: Cup;No straw Medication Administration: Crushed with puree Supervision: Patient able to self  feed;Full supervision/cueing for compensatory strategies Compensations: Slow rate;Small sips/bites;Multiple dry swallows after each bite/sip;Clear throat intermittently;Effortful swallow Postural Changes and/or Swallow Maneuvers: Seated upright 90 degrees;Upright 30-60 min after meal Oral Care Recommendations: Oral care BID Follow up Recommendations: Home health SLP Prognosis Prognosis for Safe Diet Advancement: Good Barriers/Prognosis Comment: suspected chronic dysphagia Individuals Consulted Consulted and Agree with Results and Recommendations: Patient;Family member/caregiver Family Member Consulted: daughter  SLP Assessment/Plan Dysphagia Diagnosis: Mild pharyngeal phase dysphagia;Mild cervical esophageal phase dysphagia Clinical impression: Pt's swallow has functionally improved but continues to present with a mild pharyngeal dysphagia characterized by delayed swallow initiation resulting in flash penetration of nectar thick liquids and deep penetration of thin liquids. A cued cough was unsuccessful in eliminating penetrates.  Pt also demonstrates minimal valleculae and pyiform sinus residue that cleared with cued second swallows.  Pt's overall esophageal phase appeared to be [facility] with minimal backflow noted that the pt sensed and cleared with a cued second swallow. Recommend pt initiate Dys. 1 diet with nectar-thick liquids and full supervision for utilization of strict swallowing compensatory strategies.    Short Term Goals: Week 2: SLP Short Term Goal 1 (Week 2): Pt will participate in objective swallow study to asess dysphagia  SLP Short Term Goal 1 - Progress (Week 2): Met SLP Short Term Goal 2 (Week 2): Pt will utilize swallowing compensatory strategies with Min A verbal cues. SLP Short Term Goal 3 (Week 2): Pt will recall swallowing compensatory strategies with Min A semantic cues.   General:  Date of Onset: 05/07/12 Type of Study: Modified Barium Swallowing Study Reason for  Referral: Objectively evaluate swallowing function Previous Swallow Assessment: MBS 04/30/12 and recommend to continue NPO  Diet Prior to this Study: NPO;IV;Panda Temperature Spikes Noted: No Respiratory Status: Supplemental O2 delivered via (comment) (2 Liters) History of Recent Intubation: Yes  Length of Intubations (days): 13 days Date extubated: 04/18/12 Behavior/Cognition: Alert;Cooperative Oral Cavity - Dentition: Missing dentition Oral Motor / Sensory Function: Impaired - see Bedside swallow eval Self-Feeding Abilities: Able to feed self Patient Positioning: Upright in chair Baseline Vocal Quality: Clear Volitional Cough: Weak Volitional Swallow: Able to elicit Anatomy:  (question mild osteophytes along cervical spine around C5-6) Pharyngeal Secretions: Not observed secondary MBS  Reason for Referral:  Objectively evaluate swallowing function   Oral Phase Oral Preparation/Oral Phase Oral Phase: WFL Oral - Honey Oral - Honey Teaspoon: Within functional limits Oral - Nectar Oral - Nectar Teaspoon: Within functional limits Oral - Nectar Cup: Within functional limits Oral - Thin Oral - Thin Teaspoon: Within functional limits Oral - Thin Cup: Within functional limits Oral - Thin Straw: Not tested Oral - Solids Oral - Puree: Within functional limits Oral - Regular: Not tested (due to missing dentition) Pharyngeal Phase  Pharyngeal Phase Pharyngeal Phase: Impaired Pharyngeal - Honey Pharyngeal - Honey Teaspoon: Not tested Pharyngeal - Honey Cup: Delayed swallow initiation;Pharyngeal residue - pyriform sinuses;Pharyngeal residue - valleculae Pharyngeal - Nectar Pharyngeal - Nectar Teaspoon: Delayed swallow initiation;Pharyngeal residue - pyriform sinuses;Pharyngeal residue - valleculae Pharyngeal - Nectar Cup: Delayed swallow initiation;Pharyngeal residue - pyriform sinuses;Pharyngeal residue - valleculae;Penetration/Aspiration during swallow Penetration/Aspiration details  (nectar cup): Material enters airway, CONTACTS cords then ejected out Pharyngeal - Thin Pharyngeal - Thin Teaspoon: Delayed swallow initiation;Pharyngeal residue - pyriform sinuses;Pharyngeal residue - valleculae;Penetration/Aspiration during swallow Penetration/Aspiration details (thin teaspoon): Material enters airway, CONTACTS cords then ejected out Pharyngeal - Thin Cup: Pharyngeal residue - pyriform sinuses;Pharyngeal residue - valleculae;Delayed swallow initiation;Penetration/Aspiration during swallow Penetration/Aspiration details (thin cup): Material enters airway, CONTACTS cords and not ejected out Pharyngeal - Thin Straw: Not tested Pharyngeal - Solids Pharyngeal - Puree: Delayed swallow initiation;Pharyngeal residue - valleculae Pharyngeal - Regular: Not tested (due to missing dentition) Pharyngeal Phase - Comment Pharyngeal Comment: Pt with minimal valleculae and pyiform sinus residue that cleared with cued second swallow. Pt with deep penetration of thin liquids via cup X 1 that did not clear with cued cough. Pt with flash  penetration with nectar thick liquids.  Cervical Esophageal Phase  Cervical Esophageal Phase Cervical Esophageal Phase: Impaired Cervical Esophageal Phase - Honey Honey Cup: Esophageal backflow into cervical esophagus Cervical Esophageal Phase - Solids Regular: Not tested  Chue Berkovich 05/07/2012, 4:10 PM

## 2012-05-07 NOTE — Consult Note (Signed)
Reason for Consult: Osteomyelitis ulceration right lateral malleolus Referring Physician: Dr. Estanislado Emms [place] is an 78 y.o. male.  HPI: Patient is an 78 year old gentleman diabetic peripheral vascular disease with a chronic ulcer over the lateral malleolus of the right ankle. Patient's had an MRI scan is seen today in consultation for evaluation and treatment.  Past Medical History  Diagnosis Date  . Diabetes mellitus   . Cancer   . Thyroid disease   . Anemia   . Hyperlipidemia   . Neuropathy, diabetic   . Diabetic retinopathy     Past Surgical History  Procedure Date  . Cataract extraction, bilateral   . Colonoscopy w/ polypectomy   . Esophagogastroduodenoscopy 04/25/2012    Procedure: ESOPHAGOGASTRODUODENOSCOPY (EGD);  Surgeon: Meryl Dare, MD,FACG;  Location: [facility];  Service: Endoscopy;  Laterality: N/A;    Family History  Problem Relation Age of Onset  . Hypertension      Social History:  reports that he quit smoking about 5 years ago. His smoking use included Cigarettes. He smoked .25 packs per day. He has never used smokeless tobacco. He reports that he does not drink alcohol or use illicit drugs.  Allergies: No Known Allergies  Medications: I have reviewed the patient's current medications.  Results for orders placed during the hospital encounter of 04/26/12 (from the past 48 hour(s))  GLUCOSE, CAPILLARY     Status: Abnormal   Collection Time   05/05/12  8:20 PM      Component Value Range Comment   Glucose-Capillary 203 (*) 70 - 99 mg/dL    Comment 1 Notify RN     GLUCOSE, CAPILLARY     Status: Abnormal   Collection Time   05/06/12 12:02 AM      Component Value Range Comment   Glucose-Capillary 213 (*) 70 - 99 mg/dL    Comment 1 Notify RN     GLUCOSE, CAPILLARY     Status: Abnormal   Collection Time   05/06/12  4:09 AM      Component Value Range Comment   Glucose-Capillary 172 (*) 70 - 99 mg/dL    Comment 1 Notify RN     GLUCOSE, CAPILLARY      Status: Abnormal   Collection Time   05/06/12  7:54 AM      Component Value Range Comment   Glucose-Capillary 130 (*) 70 - 99 mg/dL    Comment 1 Documented in Chart      Comment 2 Notify RN     CBC WITH DIFFERENTIAL     Status: Abnormal   Collection Time   05/06/12  8:56 AM      Component Value Range Comment   WBC 34.0 (*) 4.0 - 10.5 K/uL    RBC 2.55 (*) 4.22 - 5.81 MIL/uL    Hemoglobin 7.9 (*) 13.0 - 17.0 g/dL    HCT 16.1 (*) 09.6 - 52.0 %    MCV 98.0  78.0 - 100.0 fL    MCH 31.0  26.0 - 34.0 pg    MCHC 31.6  30.0 - 36.0 g/dL    RDW 04.5  40.9 - 81.1 %    Platelets 501 (*) 150 - 400 K/uL    Neutrophils Relative 91 (*) 43 - 77 %    Lymphocytes Relative 5 (*) 12 - 46 %    Monocytes Relative 4  3 - 12 %    Eosinophils Relative 0  0 - 5 %    Basophils Relative 0  0 - 1 %    Neutro Abs 30.9 (*) 1.7 - 7.7 K/uL    Lymphs Abs 1.7  0.7 - 4.0 K/uL    Monocytes Absolute 1.4 (*) 0.1 - 1.0 K/uL    Eosinophils Absolute 0.0  0.0 - 0.7 K/uL    Basophils Absolute 0.0  0.0 - 0.1 K/uL    WBC Morphology TOXIC GRANULATION   VACUOLATED NEUTROPHILS  BASIC METABOLIC PANEL     Status: Abnormal   Collection Time   05/06/12  8:56 AM      Component Value Range Comment   Sodium 131 (*) 135 - 145 mEq/L    Potassium 4.3  3.5 - 5.1 mEq/L    Chloride 92 (*) 96 - 112 mEq/L    CO2 32  19 - 32 mEq/L    Glucose, Bld 115 (*) 70 - 99 mg/dL    BUN 22  6 - 23 mg/dL    Creatinine, Ser 6.21  0.50 - 1.35 mg/dL    Calcium 8.9  8.4 - 30.8 mg/dL    GFR calc non Af Amer 88 (*) >90 mL/min    GFR calc Af Amer >90  >90 mL/min   GLUCOSE, CAPILLARY     Status: Abnormal   Collection Time   05/06/12 11:25 AM      Component Value Range Comment   Glucose-Capillary 112 (*) 70 - 99 mg/dL    Comment 1 Documented in Chart      Comment 2 Notify RN     GLUCOSE, CAPILLARY     Status: Abnormal   Collection Time   05/06/12  5:05 PM      Component Value Range Comment   Glucose-Capillary 147 (*) 70 - 99 mg/dL    Comment 1 Documented in  Chart      Comment 2 Notify RN     GLUCOSE, CAPILLARY     Status: Abnormal   Collection Time   05/06/12  9:47 PM      Component Value Range Comment   Glucose-Capillary 272 (*) 70 - 99 mg/dL    Comment 1 Notify RN     GLUCOSE, CAPILLARY     Status: Abnormal   Collection Time   05/07/12 12:05 AM      Component Value Range Comment   Glucose-Capillary 314 (*) 70 - 99 mg/dL    Comment 1 Notify RN     GLUCOSE, CAPILLARY     Status: Abnormal   Collection Time   05/07/12  4:41 AM      Component Value Range Comment   Glucose-Capillary 281 (*) 70 - 99 mg/dL    Comment 1 Notify RN     GLUCOSE, CAPILLARY     Status: Abnormal   Collection Time   05/07/12  7:38 AM      Component Value Range Comment   Glucose-Capillary 158 (*) 70 - 99 mg/dL    Comment 1 Notify RN     GLUCOSE, CAPILLARY     Status: Abnormal   Collection Time   05/07/12 11:25 AM      Component Value Range Comment   Glucose-Capillary 133 (*) 70 - 99 mg/dL    Comment 1 Notify RN     GLUCOSE, CAPILLARY     Status: Abnormal   Collection Time   05/07/12  4:37 PM      Component Value Range Comment   Glucose-Capillary 296 (*) 70 - 99 mg/dL    Comment 1 Notify RN  Dg Swallowing Func-no Report  05/07/2012  CLINICAL DATA: dysphagia   FLUOROSCOPY FOR SWALLOWING FUNCTION STUDY:  Fluoroscopy was provided for swallowing function study, which was  administered by a speech pathologist.  Final results and recommendations  from this study are contained within the speech pathology report.      Review of Systems  All other systems reviewed and are negative.   Blood pressure 136/62, pulse 78, temperature 97.2 F (36.2 C), temperature source Oral, resp. rate 18, height 5' 8.9" (1.75 m), weight 64.365 kg (141 lb 14.4 oz), SpO2 91.00%. Physical Exam On examination patient has thin atrophic skin to the right lower extremity pulses are not palpable he has a chronic ulcer over the lateral malleolus right ankle. Assessment/Plan: Assessment:  Osteomyelitis and Wagner grade 3 ulcer lateral malleolus right ankle.  Plan: Will plan for a right transtibial amputation. Discussed options with the patient and this would be the only way to completely eradicate the osteomyelitis. Will plan for a transtibial amputation next week. Patient is safe to be discharged to home with generalized wound care and I can followup as an outpatient to schedule this if he is discharged prior to end of next week.  Mizraim Harmening V 05/07/2012, 6:33 PM

## 2012-05-07 NOTE — Progress Notes (Signed)
Occupational Therapy Session Note  Patient Details  Name: [name] MRN: [id number] Date of Birth: 06-25-1929  Today's Date: 05/07/2012 Time: 4132-4401 Time Calculation (min): 43 min  Short Term Goals: Week 2:  OT Short Term Goal 1 (Week 2): Pt will perform lower body bathing with min A. OT Short Term Goal 2 (Week 2): Pt will perform toilet transfers with min assist using RW to 3:1 OT Short Term Goal 3 (Week 2): Pt will perform toileting tasks (3/3) with steady assist. OT Short Term Goal 4 (Week 2): Pt will perform lower body dressing with min A.  Skilled Therapeutic Interventions/Progress Updates:    Pt in bed but agreeable to participating in bathing and dressing tasks seated in w/c at sink.  Unable to remove shirt secondary to patient has PICC now and IV nurse not able to come to room to assist with IV.  Pt completed LB/perineal area while standing at sink.  Pt able to thread pants through legs but required assistance with catheter tubing and bag.  Pt completed grooming tasks including shaving while seated in w/c at sink.  Session ended early secondary to MBSS.  Focus on activity tolerance, transfers, and standing balance.  Therapy Documentation Precautions:  Precautions Precautions: Fall Precaution Comments: NG tube, multiple decubitus lcers to sacrum and lower legs Required Braces or Orthoses: Other Brace/Splint Other Brace/Splint: pressure relieving heel protectors when in bed Restrictions Weight Bearing Restrictions: No General: General Amount of Missed OT Time (min): 17 Minutes; patient went to [facility] for MBSS    Pain: Pain Assessment Pain Assessment: No/denies pain  See FIM for current functional status  Therapy/Group: Individual Therapy  [name] 05/07/2012, 8:48 AM

## 2012-05-07 NOTE — Progress Notes (Signed)
Patient ID: [name], male   DOB: [birth year], 78 y.o.   MRN: [id number] Subjective/Complaints: No problems last night. Slept well. No shortness of breath, only occasional cough. Denies pain. Daughter with multiple questions.  NG tube in and out several times, requiring fluoro placement No Swallow eval recently Appreciate ID consult A 12 point review of systems has been performed and if not noted above is otherwise negative.   Objective: Vital Signs: Blood pressure 122/56, pulse 67, temperature 97.4 F (36.3 C), temperature source Oral, resp. rate 19, height 5' 8.9" (1.75 m), weight 64.365 kg (141 lb 14.4 oz), SpO2 100.00%. Dg Abd Portable 1v  05/05/2012  *RADIOLOGY REPORT*  Clinical Data: Recheck tube placement.  PORTABLE ABDOMEN - 1 VIEW  Comparison: Earlier today at 1358 hours.  Findings: 1505 hours.  Nasogastric tube again looped in the stomach.  The tip is no longer identified, above the superior aspect of the film.  Bilateral airspace opacities.  No gross bowel obstruction or free intraperitoneal air.  IMPRESSION: Nasogastric tube again looped in the stomach with tip above the imaged lower thoracic esophagus.   Original Report Authenticated By: Consuello Bossier, M.D.    Dg Abd Portable 1v  05/05/2012  *RADIOLOGY REPORT*  Clinical Data: Nasogastric tube placement  PORTABLE ABDOMEN - 1 VIEW  Comparison: 05/03/2012  Findings: The nasogastric tube   loops just beyond the GE junction, with the tip and side holes in the distal esophagus.  Residual contrast material noted in the colon.  IMPRESSION:  1.  Malpositioned nasogastric tube, looped back into the distal esophagus.   Original Report Authenticated By: Osa Craver, M.D.     Basename 05/06/12 0856  WBC 34.0*  HGB 7.9*  HCT 25.0*  PLT 501*    Basename 05/06/12 0856  NA 131*  K 4.3  CL 92*  CO2 32  GLUCOSE 115*  BUN 22  CREATININE 0.66  CALCIUM 8.9   CBG (last 3)   Basename 05/07/12 0005 05/06/12 2147 05/06/12 1705    GLUCAP 314* 272* 147*    Wt Readings from Last 3 Encounters:  05/01/12 64.365 kg (141 lb 14.4 oz)  04/26/12 65 kg (143 lb 4.8 oz)  04/26/12 65 kg (143 lb 4.8 oz)    Physical Exam:  HENT:  Head: Normocephalic.  NGT in place but appears to be pulled out 8 inches or so. Missing many teeth. Mucosa pink but with white film over tongue and pharynx. Phonation better Eyes:  Pupils round and reactive to light  Neck: Neck supple. No thyromegaly present.  Cardiovascular: Regular rhythm. Normal rate Pulmonary/Chest: Breath sounds normal. No respiratory distress. He has no wheezes. Occasional upper airway sounds heard as well as rhonchi Abdominal: Bowel sounds are normal. He exhibits no distension.  Neurological: He is alert.  Patient is a poor historian. He was able to give his date of birth and age.   He would follow simple commands. limited insight and awareness. He had difficulty controlling his secretions and a weak cough. Strength was symmetrical but he was weak more proximally than distally (2-3 prox to 4 distally). No focal sensory loss was discerned.  Skin:  Skin: 4cm lateral mallelolus wound with fibronecrotic tissue which is sloughing. Buttock areas with fibronecrotic tissue also (x 2) about 1-2cm in diameter Psychiatric:  Patient with improved awareness and insight   Assessment/Plan: 1. Functional deficits secondary to sepsis/deconditioning which require 3+ hours per day of interdisciplinary therapy in a comprehensive inpatient rehab setting. Physiatrist is providing  close team supervision and 24 hour management of active medical problems listed below. Physiatrist and rehab team continue to assess barriers to discharge/monitor patient progress toward functional and medical goals.  The patient requires restraints due to pulling out ngt  FIM: FIM - Bathing Bathing Steps Patient Completed: Chest;Right Arm;Left Arm;Abdomen (pt declined to bathe lower body) Bathing: 2: Max-Patient  completes 3-4 12f 10 parts or 25-49%  FIM - Upper Body Dressing/Undressing Upper body dressing/undressing steps patient completed: Thread/unthread right sleeve of pullover shirt/dresss;Thread/unthread left sleeve of pullover shirt/dress;Put head through opening of pull over shirt/dress;Pull shirt over trunk Upper body dressing/undressing: 5: Supervision: Safety issues/verbal cues FIM - Lower Body Dressing/Undressing Lower body dressing/undressing steps patient completed: Pull underwear up/down;Pull pants up/down Lower body dressing/undressing: 2: Max-Patient completed 25-49% of tasks  FIM - Toileting Toileting: 0: Activity did not occur  FIM - Diplomatic Services operational officer Devices: Grab bars;Elevated toilet seat Toilet Transfers: 0-Activity did not occur  FIM - Banker Devices: Therapist, occupational: 5: Supine > Sit: Supervision (verbal cues/safety issues);5: Bed > Chair or W/C: Supervision (verbal cues/safety issues);5: Chair or W/C > Bed: Supervision (verbal cues/safety issues)  FIM - Locomotion: Wheelchair Distance: 100' Locomotion: Wheelchair: 2: Travels 50 - 149 ft with supervision, cueing or coaxing FIM - Locomotion: Ambulation Locomotion: Ambulation Assistive Devices: Designer, industrial/product Ambulation/Gait Assistance: 4: Min guard Locomotion: Ambulation: 0: Activity did not occur  Comprehension Comprehension Mode: Auditory Comprehension: 5-Follows basic conversation/direction: With no assist  Expression Expression Mode: Verbal Expression: 5-Expresses complex 90% of the time/cues < 10% of the time  Social Interaction Social Interaction: 5-Interacts appropriately 90% of the time - Needs monitoring or encouragement for participation or interaction.  Problem Solving Problem Solving: 4-Solves basic 75 - 89% of the time/requires cueing 10 - 24% of the time  Memory Memory: 4-Recognizes or recalls 75 - 89% of the  time/requires cueing 10 - 24% of the time Medical Problem List and Plan:  1. deconditioning related to sepsis multiple medical complications/metabolic encephalopathy/respiratory failure. Will discuss with critical care medicine on weaning of prednisone  2. DVT Prophylaxis/Anticoagulation: Subcutaneous heparin. Monitor platelet counts and any signs of bleeding  3. Pain Management: Lortab as needed. Monitor with increased mobility  4. Neuropsych: This patient is not capable of making decisions on his/her own behalf.  5. Dysphagia. Hold nasogastric tubes for now for nutritional support. IVF today  -phonation is better and throat is less painful  -repeat MBS today or tomorrow pending surgical issues etc.  -we will need to make a decision regarding plan soon 6.NSTEMI. Followup per cardiology services. Continue aspirin therapy as well as Lopressor 12.5 mg twice a day (hold today). No current chest pain or shortness of breath  7. Esophageal candidiasis. Diflucan x7 days.   -will add nystatin to regimen to paint tongue and oral cavity 8. Diabetes mellitus with peripheral neuropathy. Hemoglobin A1c 10.5. Lantus insulin 15 units at bedtime. added am lantus as well to better cover pm cbgs.  Check blood sugars a.c. and at bedtime   -continue to titrate further as indicated 9. Right lateral malleolus osteomyelitis ID recommends Ortho consult 10. Anemia. Latest hemoglobin 8. Monitor for a bleeding episodes and followup CBC with plan to transfuse if symptomatic  11. Hypothyroidism. Synthroid  12. Medical noncompliance. Will discuss at length with patient and family the need to maintain medical regimen 13. Leukocytosis: wbc's continue to rise. He has been on chronic prednisone and has osteomyelitis and UTI  -  Since surgical cultures may be obtained will hold on antiobiotics unless pt is febrile and more symptomatic from UTI  -ortho follow up pending today 14. Hyponatremia:Will use .9 NS for fluid  -  LOS  (Days) 11 A FACE TO FACE EVALUATION WAS PERFORMED  SWARTZ,ZACHARY T 05/07/2012, 7:18 AM

## 2012-05-07 NOTE — Progress Notes (Signed)
Speech Language Pathology Daily Session Note  Patient Details  Name: [name] MRN: [id number] Date of Birth: 11-03-1928  Today's Date: 05/07/2012 Time: 1130-1225 Time Calculation (min): 55 min  Short Term Goals: Week 1: SLP Short Term Goal 1 (Week 1): Pt will consume trials of thin liquids via spoon without overt s/s of aspiration with 75% of trials SLP Short Term Goal 1 - Progress (Week 1): Met SLP Short Term Goal 2 (Week 1): Pt will consume trials of puree textures without overt s/s of aspiration with 75% of trials. SLP Short Term Goal 2 - Progress (Week 1): Met SLP Short Term Goal 3 (Week 1): Pt will recall new, daily information with supervision verbal and visual cues.  SLP Short Term Goal 3 - Progress (Week 1): Not met  Skilled Therapeutic Interventions: Treatment session focused on addressing dysphagia goals in a group setting.  SLP facilitated session with mod assist verbal cues to recall and utilize safe swallow compensatory strategies while consuming Dys.1 textures and nectar-thick liquids via cup sip.  SLP's cues to consume small bites/sips, clear throat and swallow an extra time prevented any overt s/s of aspiration during p.o. intake.    FIM:  Comprehension Comprehension Mode: Auditory Comprehension: 5-Follows basic conversation/direction: With no assist Expression Expression Mode: Verbal Expression: 5-Expresses complex 90% of the time/cues < 10% of the time Social Interaction Social Interaction: 5-Interacts appropriately 90% of the time - Needs monitoring or encouragement for participation or interaction. Problem Solving Problem Solving: 4-Solves basic 75 - 89% of the time/requires cueing 10 - 24% of the time Memory Memory: 3-Recognizes or recalls 50 - 74% of the time/requires cueing 25 - 49% of the time FIM - Eating Eating Activity: 5: Supervision/cues (Pt NPO)  Pain Pain Assessment Pain Assessment: No/denies pain  Therapy/Group: Group Therapy  [name]., CCC-SLP [phone]  [name] 05/07/2012, 1:12 PM

## 2012-05-07 NOTE — Progress Notes (Signed)
Physical Therapy Session Note  Patient Details  Name: [name] MRN: [id number] Date of Birth: 10/29/1928  Today's Date: 05/07/2012 Time: 4098-1191 Time Calculation (min): 28 min  Short Term Goals: Week 1:  PT Short Term Goal 1 (Week 1): Patient will be able to perform Bed mobility with Min-Assist PT Short Term Goal 1 - Progress (Week 1): Met PT Short Term Goal 2 (Week 1): Patient will be able to perform Transfers with Min-Assist PT Short Term Goal 2 - Progress (Week 1): Met PT Short Term Goal 3 (Week 1): Patient will be able to perform gait using RW x 30' with Min-Assist PT Short Term Goal 3 - Progress (Week 1): Met PT Short Term Goal 4 (Week 1): Patient will be able to ascend/descend 2 steps using B handrails with Min-Assist PT Short Term Goal 4 - Progress (Week 1): Not met  Skilled Therapeutic Interventions/Progress Updates:  Patient on bedside commode upon entering room. Assisted patient with sit to stand while nurse tech assisted with hygiene and dressing change. Patient able to maintain standing with minimal assist for 4 minutes. Patient then reported being too fatigued to ambulate more. Patient did agree to participate with LE strengthening exercises in sitting. Patient performed 15 reps of ankle dorsiflexion, resisted plantarflexion, knee extension, resisted knee flexion, resisted hip abduction, hip adduction (towel squeezes), and hip flexion (marching).   Therapy Documentation Precautions:  Precautions Precautions: Fall Precaution Comments: NG tube, multiple decubitus lcers to sacrum and lower legs Required Braces or Orthoses: Other Brace/Splint Other Brace/Splint: pressure relieving heel protectors when in bed Restrictions Weight Bearing Restrictions: No  Pain: Pain Assessment Pain Assessment: No/denies pain  See FIM for current functional status  Therapy/Group: Individual Therapy  Arelia Longest M 05/07/2012, 3:20 PM

## 2012-05-07 NOTE — Progress Notes (Signed)
Physical Therapy Session Note  Patient Details  Name: [name] MRN: [id number] Date of Birth: 11/07/1928  Today's Date: 05/07/2012 Time: 4098-1191 Time Calculation (min): 45 min   Skilled Therapeutic Interventions/Progress Updates:    Wheelchair mobility x 150' for generalized strength and endurance. Session primarily focused on stair training. Initial approach to steps unsuccessful, pt unable to lift either LE enough to clear step. With second and third attempt after rest pt able to perform 2 steps with moderate assist however needed extended seated rest between each (pt has 4 steps to enter house). Standing foot taps to short step to work on hip flexion strength.    SpO2 down to 85% on 2 L [place] O2 post steps. Pt able to recover to >95% with seated rest.   Therapy Documentation Precautions:  Precautions Precautions: Fall Precaution Comments: NG tube, multiple decubitus lcers to sacrum and lower legs Required Braces or Orthoses: Other Brace/Splint Other Brace/Splint: pressure relieving heel protectors when in bed Restrictions Weight Bearing Restrictions: No Pain: Pain Assessment Pain Assessment: No/denies pain  See FIM for current functional status  Therapy/Group: Individual Therapy  [name] 05/07/2012, 6:15 PM

## 2012-05-07 NOTE — Progress Notes (Signed)
Inpatient Diabetes Program Recommendations  AACE/ADA: New Consensus Statement on Inpatient Glycemic Control (2013)  Target Ranges:  Prepandial:   less than 140 mg/dL      Peak postprandial:   less than 180 mg/dL (1-2 hours)      Critically ill patients:  140 - 180 mg/dL   Reason for Visit: Results for [name], [name] (MRN [id number]) as of 05/07/2012 10:54  Ref. Range 05/06/2012 11:25 05/06/2012 17:05 05/06/2012 21:47 05/07/2012 00:05 05/07/2012 07:38  Glucose-Capillary Latest Range: 70-99 mg/dL 119 (H) 147 (H) 829 (H) 314 (H) 158 (H)   Note CBG's elevated.  Note Lantus insulin discontinued?  Consider restarting Lantus 15 units daily.  Also consider adding Novolog tube feed coverage 3 units tid q 4 hours to cover tube feed (hold if tube feed held). Will need to reduce Novolog correction to sensitive if tube feed coverage added.   Will follow.

## 2012-05-07 NOTE — Patient Care Conference (Signed)
Inpatient RehabilitationTeam Conference Note Date: 05/07/2012   Time: 2:37 PM    Patient Name: [name]      Medical Record Number: [id number]  Date of Birth: 03-27-1929 Sex: Male         Room/Bed: 4009/4009-01 Payor Info: Payor: MEDICARE  Plan: MEDICARE PART A AND B  Product Type: *No Product type*     Admitting Diagnosis: deconditioned  Admit Date/Time:  04/26/2012  6:17 PM Admission Comments: No comment available   Primary Diagnosis:  Sepsis Principal Problem: Sepsis  Patient Active Problem List   Diagnosis Date Noted  . Sepsis 04/29/2012  . 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 Pulmonary infiltrates 04/19/2012  . Dysphagia 04/19/2012  . Physical deconditioning 04/19/2012  . Systolic dysfunction 04/19/2012  . Hypernatremia 04/19/2012  . Non-ST elevation myocardial infarction (NSTEMI), initial episode of care 04/05/2012  . Diabetes with ulcer of foot 04/04/2012  . Sacral decubitus ulcer 04/04/2012  . Uncontrolled diabetes mellitus 11/22/2011  . Cellulitis of lower leg 11/22/2011  . Ulcer of leg due to secondary diabetes mellitus 11/22/2011  . Hypokalemia 10/21/2011  . Anemia 10/21/2011  . Weakness 10/20/2011  . Hypertension 10/20/2011  . Diabetes mellitus 10/20/2011  . Prostate cancer 10/20/2011  . Hypothyroidism 10/20/2011    Expected Discharge Date: Expected Discharge Date: 05/15/12  Team Members Present: Physician: Dr. Faith Rogue Social Worker Present: Dossie Der, LCSW Nurse Present: Daryll Brod, RN PT Present: Karolee Stamps, PT OT Present: Edwin Cap, OT SLP Present: Feliberto Gottron, SLP Other (Discipline and Name): [facility] Noel-PPS Coordinator     Current Status/Progress Goal Weekly Team Focus  Medical   osteo right lateral malleolus  improve nutritional status  improve po intake, plan for oste mgt   Bowel/Bladder   Condom cath at hs. Incontinent of bowl and bladder  Managed bowel and bladder program  Time toileting q 2-3 hrs while awake     Swallow/Nutrition/ Hydration   Dys. 1 textures and nectar thick liquids, Mod A for utilization of swallowing compensatory strategies  Min A  Tolerance of upgraded diet, utilization of swallowing compensatory strategies    ADL's   min A overall; mod A LB dressing; limited activity tolerance  supervision overall   activity tolerance; standing balance   Mobility   min assist up to supervision. Decreased activity tolerance, needs lots of rest breaks.   Supervision  Improved endurance, standing balance, strength, wheelchair mobility, pressure relief   Communication             Safety/Cognition/ Behavioral Observations  Min-Mod A  Supervision  working memory, awareness    Pain   No c/o pain  <3  Monitor   Skin   Stage 2 (sacrum, R/L buttock), 2 lower left leg ulcer, R lower leg ulcer.   No new skin breakdown  Monitor skin for new breakdown, turn q 2hrs.      *See Interdisciplinary Assessment and Plan and progress notes for long and short-term goals  Barriers to Discharge: multiple medical issues, nutritional status    Possible Resolutions to Barriers:  rx of above.     Discharge Planning/Teaching Needs:  Wife can porvide supervison level only-daughter will assist but works daytime.  Multiple medical issues      Team Discussion:  Ortho MD to eval pt's leg decide surgery.  Passed swallow-nectar thick-pureed-d/c IV's see if hydrating himself.  Continue  To work on endurance issues  Revisions to Treatment Plan:  None   Continued Need for Acute Rehabilitation Level of Care:  The patient requires daily medical management by a physician with specialized training in physical medicine and rehabilitation for the following conditions: Daily direction of a multidisciplinary physical rehabilitation program to ensure safe treatment while eliciting the highest outcome that is of practical value to the patient.: Yes Daily medical management of patient stability for increased activity during  participation in an intensive rehabilitation regime.: Yes Daily analysis of laboratory values and/or radiology reports with any subsequent need for medication adjustment of medical intervention for : Neurological problems;Post surgical problems;Other  Lucy Chris 05/07/2012, 2:37 PM

## 2012-05-07 NOTE — Progress Notes (Signed)
Physical Therapy Session Note  Patient Details  Name: [name] MRN: [id number] Date of Birth: Jan 22, 1929  Today's Date: 05/07/2012 Time: 1030-1105 Time Calculation (min): 35 min  Short Term Goals: Week 2:  PT Short Term Goal 1 (Week 2): Pt will perform stand pivot transfers with supervision PT Short Term Goal 2 (Week 2): Pt will tolerate standing activity for 3 min continuous.  PT Short Term Goal 3 (Week 2): Pt will verbalize importance of pressure relief and unweighting heels in bed for skin preservation.  Skilled Therapeutic Interventions/Progress Updates:    Discussed pressure relief of buttocks and heels. Pt verbalizes pressure relief while sitting in chair, times it with TV shows. Session primarily focused on increased ambulation. Gait training x 110' with RW, min-guard assist in controlled environment. Home environment ambulation over carpet 15', 15' with RW, min-guard assist. Verbal cues for posture and safety. Session limited by condom catheter falling off during session and by pt fatigue.   Therapy Documentation Precautions:  Precautions Precautions: Fall Precaution Comments: NG tube, multiple decubitus lcers to sacrum and lower legs Required Braces or Orthoses: Other Brace/Splint Other Brace/Splint: pressure relieving heel protectors when in bed Restrictions Weight Bearing Restrictions: No Pain: Pain Assessment Pain Assessment: No/denies pain  See FIM for current functional status  Therapy/Group: Individual Therapy  [name] 05/07/2012, 12:29 PM

## 2012-05-08 ENCOUNTER — Inpatient Hospital Stay (HOSPITAL_COMMUNITY): Payer: Medicare Other | Admitting: Occupational Therapy

## 2012-05-08 ENCOUNTER — Inpatient Hospital Stay (HOSPITAL_COMMUNITY): Payer: Medicare Other

## 2012-05-08 ENCOUNTER — Inpatient Hospital Stay (HOSPITAL_COMMUNITY): Payer: Medicare Other | Admitting: Speech Pathology

## 2012-05-08 ENCOUNTER — Inpatient Hospital Stay (HOSPITAL_COMMUNITY): Payer: Medicare Other | Admitting: Physical Therapy

## 2012-05-08 LAB — GLUCOSE, CAPILLARY
Glucose-Capillary: 280 mg/dL — ABNORMAL HIGH (ref 70–99)
Glucose-Capillary: 314 mg/dL — ABNORMAL HIGH (ref 70–99)
Glucose-Capillary: 79 mg/dL (ref 70–99)
Glucose-Capillary: 86 mg/dL (ref 70–99)

## 2012-05-08 MED ORDER — INSULIN ASPART 100 UNIT/ML ~~LOC~~ SOLN
0.0000 [IU] | SUBCUTANEOUS | Status: DC
  Administered 2012-05-08: 7 [IU] via SUBCUTANEOUS
  Administered 2012-05-08: 9 [IU] via SUBCUTANEOUS
  Administered 2012-05-09: 3 [IU] via SUBCUTANEOUS
  Administered 2012-05-09: 9 [IU] via SUBCUTANEOUS
  Administered 2012-05-09: 3 [IU] via SUBCUTANEOUS
  Administered 2012-05-09: 9 [IU] via SUBCUTANEOUS
  Administered 2012-05-09: 2 [IU] via SUBCUTANEOUS
  Administered 2012-05-10: 5 [IU] via SUBCUTANEOUS
  Administered 2012-05-10: 9 [IU] via SUBCUTANEOUS
  Administered 2012-05-10 (×2): 2 [IU] via SUBCUTANEOUS
  Administered 2012-05-10: 5 [IU] via SUBCUTANEOUS
  Administered 2012-05-11: 3 [IU] via SUBCUTANEOUS
  Administered 2012-05-11: 7 [IU] via SUBCUTANEOUS
  Administered 2012-05-11: 2 [IU] via SUBCUTANEOUS
  Administered 2012-05-11: 9 [IU] via SUBCUTANEOUS

## 2012-05-08 MED ORDER — STARCH (THICKENING) PO POWD
ORAL | Status: DC | PRN
  Filled 2012-05-08: qty 227

## 2012-05-08 NOTE — Progress Notes (Signed)
Speech Language Pathology Daily Session Note  Patient Details  Name: [name] MRN: [id number] Date of Birth: [birth year]  Today's Date: 05/08/2012 Time: 0900-0925 Time Calculation (min): 25 min  Short Term Goals: Week 2: SLP Short Term Goal 1 (Week 2): Pt will participate in objective swallow study to asess dysphagia  SLP Short Term Goal 1 - Progress (Week 2): Met SLP Short Term Goal 2 (Week 2): Pt will utilize swallowing compensatory strategies with Min A verbal cues. SLP Short Term Goal 3 (Week 2): Pt will recall swallowing compensatory strategies with Min A semantic cues.   Skilled Therapeutic Interventions: Treatment session focused on recall/utilization of swallowing compensatory strategies. Pt recalled swallowing compensatory strategies with Min semantic cues and utilized them with nectar-thick liquids with supervision verbal cues. Pt without overt s/s of aspiration. Pt/nursing staff was also educated on the importance of the pt feeding himself to reduce aspiration risk. Pt also recalled surgical consult with Mod I.    FIM:  Comprehension Comprehension Mode: Auditory Comprehension: 5-Understands basic 90% of the time/requires cueing < 10% of the time Expression Expression Mode: Verbal Expression: 5-Expresses basic 90% of the time/requires cueing < 10% of the time. Social Interaction Social Interaction: 5-Interacts appropriately 90% of the time - Needs monitoring or encouragement for participation or interaction. Problem Solving Problem Solving: 4-Solves basic 75 - 89% of the time/requires cueing 10 - 24% of the time Memory Memory: 4-Recognizes or recalls 75 - 89% of the time/requires cueing 10 - 24% of the time FIM - Eating Eating Activity: 5: Supervision/cues  Pain Pain Assessment Pain Assessment: No/denies pain Pain Score: 0-No pain Faces Pain Scale: No hurt  Therapy/Group: Individual Therapy  [name] 05/08/2012, 10:43 AM

## 2012-05-08 NOTE — Progress Notes (Signed)
Patient ID: [name], male   DOB: [birth year], 78 y.o.   MRN: [id number] Subjective/Complaints: No problems last night. Slept well. No shortness of breath, only occasional cough. Denies pain. Daughter with multiple questions.  NG tube in and out several times, requiring fluoro placement No Swallow eval recently Appreciate ID consult A 12 point review of systems has been performed and if not noted above is otherwise negative.   Objective: Vital Signs: Blood pressure 104/44, pulse 59, temperature 98.2 F (36.8 C), temperature source Oral, resp. rate 18, height 5' 8.9" (1.75 m), weight 65.273 kg (143 lb 14.4 oz), SpO2 97.00%. Dg Swallowing Func-no Report  05/07/2012  CLINICAL DATA: dysphagia   FLUOROSCOPY FOR SWALLOWING FUNCTION STUDY:  Fluoroscopy was provided for swallowing function study, which was  administered by a speech pathologist.  Final results and recommendations  from this study are contained within the speech pathology report.      Basename 05/06/12 0856  WBC 34.0*  HGB 7.9*  HCT 25.0*  PLT 501*    Basename 05/06/12 0856  NA 131*  K 4.3  CL 92*  CO2 32  GLUCOSE 115*  BUN 22  CREATININE 0.66  CALCIUM 8.9   CBG (last 3)   Basename 05/08/12 0742 05/08/12 0712 05/08/12 0424  GLUCAP 79 39* 86    Wt Readings from Last 3 Encounters:  05/08/12 65.273 kg (143 lb 14.4 oz)  04/26/12 65 kg (143 lb 4.8 oz)  04/26/12 65 kg (143 lb 4.8 oz)    Physical Exam:  HENT:  Head: Normocephalic.  NGT in place but appears to be pulled out 8 inches or so. Missing many teeth. Mucosa pink but with white film over tongue and pharynx. Phonation better Eyes:  Pupils round and reactive to light  Neck: Neck supple. No thyromegaly present.  Cardiovascular: Regular rhythm. Normal rate Pulmonary/Chest: Breath sounds normal. No respiratory distress. He has no wheezes. Occasional upper airway sounds heard as well as rhonchi Abdominal: Bowel sounds are normal. He exhibits no distension.    Neurological: He is alert.  Patient is a poor historian. He was able to give his date of birth and age.   He would follow simple commands. limited insight and awareness. He had difficulty controlling his secretions and a weak cough. Strength was symmetrical but he was weak more proximally than distally (2-3 prox to 4 distally). No focal sensory loss was discerned.  Skin:  Skin: 4cm lateral mallelolus wound with fibronecrotic tissue which is sloughing. Buttock areas with fibronecrotic tissue also (x 2) about 1-2cm in diameter Psychiatric:  Patient with improved awareness and insight   Assessment/Plan: 1. Functional deficits secondary to sepsis/deconditioning which require 3+ hours per day of interdisciplinary therapy in a comprehensive inpatient rehab setting. Physiatrist is providing close team supervision and 24 hour management of active medical problems listed below. Physiatrist and rehab team continue to assess barriers to discharge/monitor patient progress toward functional and medical goals.  Will need to discuss with team regarding fxnl gains/plan considering need for BKA. Target date for potential surgery is close to when he wa scheduled to go home from here. Most likley scenario would be for him to go from here to surgery.   FIM: FIM - Bathing Bathing Steps Patient Completed: Front perineal area;Buttocks;Right upper leg;Left upper leg Bathing: 4: Min-Patient completes 8-9 54f 10 parts or 75+ percent (unable to bathe UB secondary could not remove shirt)  FIM - Upper Body Dressing/Undressing Upper body dressing/undressing steps patient completed: Thread/unthread right sleeve of  pullover shirt/dresss;Thread/unthread left sleeve of pullover shirt/dress;Put head through opening of pull over shirt/dress;Pull shirt over trunk Upper body dressing/undressing: 0: Activity did not occur FIM - Lower Body Dressing/Undressing Lower body dressing/undressing steps patient completed: Thread/unthread  right underwear leg;Thread/unthread left underwear leg;Pull underwear up/down;Thread/unthread right pants leg;Thread/unthread left pants leg;Pull pants up/down Lower body dressing/undressing: 4: Min-Patient completed 75 plus % of tasks  FIM - Toileting Toileting: 0: Activity did not occur  FIM - Diplomatic Services operational officer Devices: Grab bars;Elevated toilet seat Toilet Transfers: 0-Activity did not occur  FIM - Banker Devices: Therapist, occupational: 5: Supine > Sit: Supervision (verbal cues/safety issues);5: Bed > Chair or W/C: Supervision (verbal cues/safety issues);5: Chair or W/C > Bed: Supervision (verbal cues/safety issues)  FIM - Locomotion: Wheelchair Distance: 150' Locomotion: Wheelchair: 5: Travels 150 ft or more: maneuvers on rugs and over door sills with supervision, cueing or coaxing FIM - Locomotion: Ambulation Locomotion: Ambulation Assistive Devices: Designer, industrial/product Ambulation/Gait Assistance: 4: Min guard Locomotion: Ambulation: 0: Activity did not occur  Comprehension Comprehension Mode: Auditory Comprehension: 5-Understands complex 90% of the time/Cues < 10% of the time  Expression Expression Mode: Verbal Expression: 5-Expresses complex 90% of the time/cues < 10% of the time  Social Interaction Social Interaction: 5-Interacts appropriately 90% of the time - Needs monitoring or encouragement for participation or interaction.  Problem Solving Problem Solving: 5-Solves basic 90% of the time/requires cueing < 10% of the time  Memory Memory: 3-Recognizes or recalls 50 - 74% of the time/requires cueing 25 - 49% of the time Medical Problem List and Plan:  1. deconditioning related to sepsis multiple medical complications/metabolic encephalopathy/respiratory failure. Will discuss with critical care medicine on weaning of prednisone  2. DVT Prophylaxis/Anticoagulation: Subcutaneous heparin. Monitor platelet  counts and any signs of bleeding  3. Pain Management: Lortab as needed. Monitor with increased mobility  4. Neuropsych: This patient is not capable of making decisions on his/her own behalf.  5. Dysphagia. Hold nasogastric tubes for now for nutritional support. IVF today  -phonation is better and throat is less painful  -on D1 nectars currently  -check lytes tomorrow   6.NSTEMI. Followup per cardiology services. Continue aspirin therapy as well as Lopressor 12.5 mg twice a day (hold today). No current chest pain or shortness of breath  7. Esophageal candidiasis. Diflucan x7 days.   -will add nystatin to regimen to paint tongue and oral cavity 8. Diabetes mellitus with peripheral neuropathy. Hemoglobin A1c 10.5. Lantus insulin 15 units at bedtime. added am lantus as well to better cover pm cbgs.  Check blood sugars a.c. and at bedtime   -continue to titrate further as indicated- sugars have been quite variable recently. Hesitate making further changes at present. 9. Right lateral malleolus osteomyelitis- ortho has seen 10. Anemia. Latest hemoglobin 8. Monitor for a bleeding episodes and followup CBC with plan to transfuse if symptomatic  11. Hypothyroidism. Synthroid  12. Medical noncompliance. Will discuss at length with patient and family the need to maintain medical regimen 13. Leukocytosis: wbc's continue to rise. He has been on chronic prednisone and has osteomyelitis and UTI  - Since surgical cultures may be obtained will hold on antiobiotics unless pt is febrile and more symptomatic from UTI  -Dr. Lajoyce Corners is recommending a right BKA next week. Pt is ok with this as long it "gets me home" 14. Hyponatremia: follow up labs tomorrow  -on diet currently  -  LOS (Days) 12 A FACE TO FACE  EVALUATION WAS PERFORMED  SWARTZ,ZACHARY T 05/08/2012, 8:26 AM

## 2012-05-08 NOTE — Progress Notes (Signed)
Physical Therapy Note  Patient Details  Name: Jaydin Boniface MRN: [id number] Date of Birth: 17-Feb-1929 Today's Date: 05/08/2012  1345 -1440 (55 minutes) individual Pain - no complaint of pain Other: Oxygen sats (resting) 99% on 2 L  [place] Focus of treatment: therapeutic exercises to improve tolerance to activity; gait training ( including steps) to enter home Treatment: transfers (stand/pivot ) SBA RW; Nustep Level 3 (2 X 5 minutes) Oxygen sats > 96%  2 L  [place]; Up/down 2 (4 inch) steps X 2 using bilateral rails or one rail sideways min assist; Gait - 80 feet RW close SBA.   Trinita Devlin,JIM 05/08/2012, 8:14 AM

## 2012-05-08 NOTE — Progress Notes (Signed)
Occupational Therapy Session Note  Patient Details  Name: [name] MRN: [id number] Date of Birth: March 22, 1929  Today's Date: 05/08/2012 Time: 0800-0854 Time Calculation (min): 54 min  Short Term Goals: Week 2:  OT Short Term Goal 1 (Week 2): Pt will perform lower body bathing with min A. OT Short Term Goal 2 (Week 2): Pt will perform toilet transfers with min assist using RW to 3:1 OT Short Term Goal 3 (Week 2): Pt will perform toileting tasks (3/3) with steady assist. OT Short Term Goal 4 (Week 2): Pt will perform lower body dressing with min A.  Skilled Therapeutic Interventions/Progress Updates:    Pt in bed upon arrival and agreeable to bathing and dressing w/c level at sink.  Pt transferred to w/c with RW at supervision level.  Pt completed tasks at sink with extra time for rest breaks.  Pt required assistance with threading pants over legs secondary to hospital socks grabbing material of pants.  Pt completed bathing buttocks while standing and able to pull up pants while standing.  Focus on activity tolerance, standing balance, and safety awareness.  Therapy Documentation Precautions:  Precautions Precautions: Fall Precaution Comments: NG tube, multiple decubitus lcers to sacrum and lower legs Required Braces or Orthoses: Other Brace/Splint Other Brace/Splint: pressure relieving heel protectors when in bed Restrictions Weight Bearing Restrictions: No   Pain: Pain Assessment Pain Assessment: No/denies pain  See FIM for current functional status  Therapy/Group: Individual Therapy  [name] 05/08/2012, 8:55 AM

## 2012-05-08 NOTE — Consult Note (Signed)
05/07/12  NEUROCOGNITIVE TESTING - [facility]   [name] is an 78 year old, right-handed, African-American male with 12 years of education, who reported experiencing stable cognitive difficulties. According to medical records, he is suffering from functional deficits secondary to sepsis/deconditioning. In addition, it was noted that the patient may be a poor historian but that he was able to accurately provide his date of birth and age. While he was purportedly able to follow simple commands, he exhibited limited insight and awareness.   [name] was referred for neuropsychological consultation given the possibility of cognitive and emotional sequelae subsequent to his current medical status and in order to assist in treatment planning.   PROCEDURES: [3 units of 16109 on 05/07/12]  Diagnostic Interview Medical record review The following tests were performed during today's visit: Repeatable Battery for the Assessment of Neuropsychological Status (RBANS, form A), and the Geriatric Depression Scale (short form).  Test results are as follows:   Of note, some tasks could not be administered due to certain physical limitations.   RBANS Indices Scaled Score Percentile Description  Immediate Memory  65 1 Markedly impaired  Language 97 42 Average    RBANS Subtests Raw Score Percentile Description  List Learning 15 3 Impaired  Story Memory 8 3 Impaired  Line Orientation 17 70 Average  Picture Naming 10 82 Above average  Semantic Fluency 15 25 Average  Digit Span 8 30 Average  List Recall 2 19 Below average  List Recognition 18 10 Below average  Story Recall 5 19 Below average    Geriatric Depression Scale (short form) Raw Score = 2/15 Description = WNL    Cognitive Evaluation: Test results revealed generally intact functioning in most of the cognitive domains and thinking skills that were able to be assessed during this evaluation with the  exception of markedly impaired immediate learning and memory, as well as reduced delayed memory.   Emotional & Behavioral Evaluation: [name] was appropriately dressed for season and situation. Normal posture was noted, but manual dexterity was very poor. He was able to understand test directions readily. He was friendly and rapport easily established. His affect was relatively flat and he seemed mildly depressed. Attention and motivation were good. Optimal test taking conditions were maintained.  From an emotional standpoint, while [name] denied experiencing any significant depression via a self report inventory, he acknowledged suffering from at least a mild (if not moderate) degree of adjustment problems because of his present hospitalization.  In addition, his depressive symptoms are purportedly related to him not being able to follow his typical daily routine. Suicidal/homicidal ideation, plan or intent was denied. No manic or hypomanic episodes were reported. The patient denied ever experiencing any auditory/visual hallucinations. No major behavioral or personality changes were endorsed.    Overall, [name] performance is most consistent with a diagnosis of mild cognitive impairment (predominantly amnestic). The most likely etiology of his symptoms is his present medical status with emotional factors exacerbating his deficits. However, further follow-up is warranted upon discharge to rule out any underlying neurological or neurodegenerative conditions that may be present, particularly given his relatively circumscribed memory inefficiencies.   In light of these findings, the following recommendations are provided.    RECOMMENDATIONS  Recommendations for treatment team:    When interacting with [name], directions and information should be provided in a simple, straight forward manner, and the treatment team should avoid giving multiple instructions simultaneously.    He may also  benefit from being provided with multiple trials to learn new skills given the noted memory inefficiencies.    Since emotional factors are likely adversely impacting the patient's daily life, brief counseling for social support seems warranted during this hospitalization. In fact, he requested that someone stop by from time to time.     To the extent possible, multitasking should be avoided.   [name] requires more time than typical to process information. The treatment team may benefit from waiting for a verbal response to information before presenting additional information.    Performance will generally be best in a structured, routine, and familiar environment, as opposed to situations involving complex problems.   Recommendations for discharge planning:    Establish long-term follow-up care with a neurologist.    Complete a comprehensive neuropsychological evaluation as an outpatient in 8-12 months to assess for interval change   Maintain engagement in mentally, physically and cognitively stimulating activities.    Strive to maintain a healthy lifestyle (e.g., proper diet and exercise) in order to promote physical, cognitive and emotional health.    Due to the nature and severity of certain symptoms noted during this evaluation, it is recommended that [name] initially obtain constant care and supervision following this hospitalization.    Establishing a power of attorney is warranted.    The patient should refrain from driving at this time.    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  Clinical Neuropsychologist

## 2012-05-08 NOTE — Progress Notes (Signed)
Occupational Therapy Session Note [employer]  Patient Details  Name: [name] MRN: [id number] Date of Birth: Oct 13, 1928  Today's Date: 05/08/2012 Time: 1130-1150 Time Calculation (min): 20 min  Skilled Therapeutic Interventions/Progress Updates: Patient participated in [employer] today to address self management of swallowing strategies during self feeding group.  Patient fed self with minimal prompting to clear throat, and double swallow.  Patient minimally interactive with other members of group, until conversation turned to [employer].  Patient contributed to conversation, and smiled appropriately with a meaningful topic.     Therapy Documentation Precautions:  Precautions Precautions: Fall Precaution Comments: NG tube, multiple decubitus lcers to sacrum and lower legs Required Braces or Orthoses: Other Brace/Splint Other Brace/Splint: pressure relieving heel protectors when in bed Restrictions Weight Bearing Restrictions: No Pain: Pain Assessment Pain Assessment: No/denies pain Pain Score: 0-No pain Faces Pain Scale: No hurt    See FIM for current functional status  Therapy/Group: Group Therapy  [name] 05/08/2012, 2:02 PM

## 2012-05-08 NOTE — Progress Notes (Signed)
Hypoglycemic Event  CBG: 39  Treatment: 15 GM carbohydrate snack  Symptoms: None  Follow-up CBG: Time:07:44 CBG Result:79  Possible Reasons for Event:   Comments/MD notified:Pam Love, PA    Kainen Struckman D  Remember to initiate Hypoglycemia Order Set & complete

## 2012-05-08 NOTE — Progress Notes (Signed)
Speech Language Pathology Daily Session Note  Patient Details  Name: [name] MRN: [id number] Date of Birth: Sep 10, 1928  Today's Date: 05/08/2012 Time: 1130-1150 Time Calculation (min): 20 min  Short Term Goals: Week 2: SLP Short Term Goal 1 (Week 2): Pt will participate in objective swallow study to asess dysphagia  SLP Short Term Goal 1 - Progress (Week 2): Met SLP Short Term Goal 2 (Week 2): Pt will utilize swallowing compensatory strategies with Min A verbal cues. SLP Short Term Goal 3 (Week 2): Pt will recall swallowing compensatory strategies with Min A semantic cues.   Skilled Therapeutic Interventions: Co-treatment with OT; SLP facilitated session with increased wait time to verbally recall compensatory strategies and supervision verbal cues to utilize while consuming Dys.1 textures and nectar-thick liquids in a distracting group environment.  Patient demonstrated an increase in throat clears as session progressed  and SLP increased vebral cues to min assist level to utilize an extra swallow after completing a throat clear.   FIM:  Comprehension Comprehension Mode: Auditory Comprehension: 5-Understands basic 90% of the time/requires cueing < 10% of the time Expression Expression Mode: Verbal Expression: 5-Expresses basic 90% of the time/requires cueing < 10% of the time. Social Interaction Social Interaction: 5-Interacts appropriately 90% of the time - Needs monitoring or encouragement for participation or interaction. Problem Solving Problem Solving: 4-Solves basic 75 - 89% of the time/requires cueing 10 - 24% of the time Memory Memory: 4-Recognizes or recalls 75 - 89% of the time/requires cueing 10 - 24% of the time FIM - Eating Eating Activity: 5: Supervision/cues  Pain Pain Assessment Pain Assessment: No/denies pain  Therapy/Group: Group Therapy  [name]., CCC-SLP [phone]  [name] 05/08/2012, 3:17 PM

## 2012-05-08 NOTE — Progress Notes (Signed)
Inpatient Diabetes Program Recommendations  AACE/ADA: New Consensus Statement on Inpatient Glycemic Control (2013)  Target Ranges:  Prepandial:   less than 140 mg/dL      Peak postprandial:   less than 180 mg/dL (1-2 hours)      Critically ill patients:  140 - 180 mg/dL   Reason for Visit: CBG dropped this morning.  Note tube feeds have been discontinued.   Consider Novolog correction scale tid with meals and HS scale (instead of q 4 hours).  Patient is no longer on Lantus insulin, although consider looking at fasting CBG in the AM to determine if it needs to be restarted.  Also consider adding Novolog meal coverage 3 units tid with meals to cover meal intake (if patient eats less than 50%, hold meal coverage).

## 2012-05-09 ENCOUNTER — Inpatient Hospital Stay (HOSPITAL_COMMUNITY): Payer: Medicare Other

## 2012-05-09 ENCOUNTER — Inpatient Hospital Stay (HOSPITAL_COMMUNITY): Payer: Medicare Other | Admitting: Speech Pathology

## 2012-05-09 ENCOUNTER — Inpatient Hospital Stay (HOSPITAL_COMMUNITY): Payer: Medicare Other | Admitting: Physical Therapy

## 2012-05-09 ENCOUNTER — Inpatient Hospital Stay (HOSPITAL_COMMUNITY): Payer: Medicare Other | Admitting: Occupational Therapy

## 2012-05-09 LAB — GLUCOSE, CAPILLARY: Glucose-Capillary: 383 mg/dL — ABNORMAL HIGH (ref 70–99)

## 2012-05-09 LAB — CBC
HCT: 20 % — ABNORMAL LOW (ref 39.0–52.0)
Hemoglobin: 5.9 g/dL — CL (ref 13.0–17.0)
MCH: 31.7 pg (ref 26.0–34.0)
MCHC: 32.8 g/dL (ref 30.0–36.0)
MCV: 97.1 fL (ref 78.0–100.0)
RBC: 2.06 MIL/uL — ABNORMAL LOW (ref 4.22–5.81)
WBC: 18.7 10*3/uL — ABNORMAL HIGH (ref 4.0–10.5)

## 2012-05-09 LAB — BASIC METABOLIC PANEL
BUN: 22 mg/dL (ref 6–23)
Calcium: 8.1 mg/dL — ABNORMAL LOW (ref 8.4–10.5)
GFR calc non Af Amer: 89 mL/min — ABNORMAL LOW (ref >90)
Glucose, Bld: 223 mg/dL — ABNORMAL HIGH (ref 70–99)
Sodium: 131 mEq/L — ABNORMAL LOW (ref 135–145)

## 2012-05-09 LAB — PREPARE RBC (CROSSMATCH)

## 2012-05-09 MED ORDER — DIPHENHYDRAMINE HCL 25 MG PO CAPS
25.0000 mg | ORAL_CAPSULE | Freq: Once | ORAL | Status: AC
  Administered 2012-05-09: 25 mg via ORAL
  Filled 2012-05-09: qty 1

## 2012-05-09 MED ORDER — INSULIN ASPART 100 UNIT/ML ~~LOC~~ SOLN
5.0000 [IU] | Freq: Once | SUBCUTANEOUS | Status: AC
  Administered 2012-05-09: 5 [IU] via SUBCUTANEOUS

## 2012-05-09 MED ORDER — ACETAMINOPHEN 325 MG PO TABS
650.0000 mg | ORAL_TABLET | Freq: Once | ORAL | Status: AC
  Administered 2012-05-09: 650 mg via ORAL
  Filled 2012-05-09 (×2): qty 2

## 2012-05-09 MED ORDER — FUROSEMIDE 10 MG/ML IJ SOLN
20.0000 mg | Freq: Once | INTRAMUSCULAR | Status: AC
  Administered 2012-05-09: 20 mg via INTRAVENOUS
  Filled 2012-05-09: qty 2

## 2012-05-09 MED ORDER — PREDNISONE 20 MG PO TABS
30.0000 mg | ORAL_TABLET | Freq: Every day | ORAL | Status: AC
  Administered 2012-05-09 – 2012-05-11 (×3): 30 mg via ORAL
  Filled 2012-05-09 (×4): qty 1

## 2012-05-09 MED ORDER — INSULIN ASPART 100 UNIT/ML ~~LOC~~ SOLN
9.0000 [IU] | Freq: Once | SUBCUTANEOUS | Status: AC
  Administered 2012-05-09: 9 [IU] via SUBCUTANEOUS

## 2012-05-09 NOTE — Progress Notes (Signed)
Social Work Patient ID: [name], male   DOB: 12-Jul-1929, 78 y.o.   MRN: [id number] Spoke with daughter [name] via telephone to inform of team conference and need for family education next week if pt is actually going home. Encouraged daughter to call Dr Lajoyce Corners and talk with his regarding his recommendations, since she had many questions.  She reports he told her dad He needs to have his foot amputated due to the infection.  She wants Dr Riley Kill to call her also, agreeable to have Dan-PA to call her.  Trying to fomrulate a  Discharge plan but unsure medically where pt is and what the next step is.  Will keep in contact with daughter, pt is aware of recommendations and has  Questions also.

## 2012-05-09 NOTE — Progress Notes (Signed)
Notified Dan Angiulli, PA of blood sugar 542.  Patient ordered and give 9 units Novolog.

## 2012-05-09 NOTE — Progress Notes (Signed)
Speech Language Pathology Daily Session Note  Patient Details  Name: [name] MRN: [id number] Date of Birth: [birth year]  Today's Date: 05/09/2012 Time: 1130-1200 Time Calculation (min): 30 min  Short Term Goals: Week 2: SLP Short Term Goal 1 (Week 2): Pt will participate in objective swallow study to asess dysphagia  SLP Short Term Goal 1 - Progress (Week 2): Met SLP Short Term Goal 2 (Week 2): Pt will utilize swallowing compensatory strategies with Min A verbal cues. SLP Short Term Goal 3 (Week 2): Pt will recall swallowing compensatory strategies with Min A semantic cues.   Skilled Therapeutic Interventions: Treatment session focused on addressing dysphagia goals.  SLP facilitated session with trials of water via cup with mod assist verbal cues to perform throat clear/cough followed by another swallow.  Patient consumed 4oz water and displayed no overt s/s of aspiration during trials.  SLP educated daughter regarding strategies for safe swallow and how to perform oral care and she verbalized understanding.     FIM:  Comprehension Comprehension Mode: Auditory Comprehension: 5-Understands basic 90% of the time/requires cueing < 10% of the time Expression Expression Mode: Verbal Expression: 5-Expresses basic 90% of the time/requires cueing < 10% of the time. Social Interaction Social Interaction: 5-Interacts appropriately 90% of the time - Needs monitoring or encouragement for participation or interaction. Problem Solving Problem Solving: 4-Solves basic 75 - 89% of the time/requires cueing 10 - 24% of the time Memory Memory: 4-Recognizes or recalls 75 - 89% of the time/requires cueing 10 - 24% of the time FIM - Eating Eating Activity: 5: Supervision/cues  Pain Pain Assessment Pain Assessment: No/denies pain  Therapy/Group: Individual Therapy  [name]., CCC-SLP [phone]  Elwin Tsou 05/09/2012, 4:14 PM

## 2012-05-09 NOTE — Progress Notes (Signed)
Physical Therapy Session Note  Patient Details  Name: [name] MRN: [id number] Date of Birth: 1928-11-21  Today's Date: 05/09/2012 Time: 1000-1026 Time Calculation (min): 26 min  Denies pain, just feeling weak this AM. RN reports pt with very low Hg levels and scheduled to receive transfusion. Recommended light activity to pt's tolerance. Pt agreeable to therex in the w/c for strengthening. Ankle pumps, LAQs, and seated marches x 3 sets of 10 each bilaterally with rest breaks between sets and exercises. Pt prefer to stay up in w/c and call bell in reach.     Therapy Documentation Precautions:  Precautions Precautions: Fall Precaution Comments: NG tube, multiple decubitus lcers to sacrum and lower legs Required Braces or Orthoses: Other Brace/Splint Other Brace/Splint: pressure relieving heel protectors when in bed Restrictions Weight Bearing Restrictions: No   Pain: Pain Assessment Pain Assessment: No/denies pain    See FIM for current functional status  Therapy/Group: Individual Therapy  [name] [facility] 05/09/2012, 10:30 AM

## 2012-05-09 NOTE — Progress Notes (Signed)
Speech Language Pathology Daily Session Note  Patient Details  Name: [name] MRN: [id number] Date of Birth: 1928-10-28  Today's Date: 05/09/2012 Time: 1200-1210 Time Calculation (min): 10 min  Short Term Goals: Week 2: SLP Short Term Goal 1 (Week 2): Pt will participate in objective swallow study to asess dysphagia  SLP Short Term Goal 1 - Progress (Week 2): Met SLP Short Term Goal 2 (Week 2): Pt will utilize swallowing compensatory strategies with Min A verbal cues. SLP Short Term Goal 3 (Week 2): Pt will recall swallowing compensatory strategies with Min A semantic cues.   Skilled Therapeutic Interventions: Co-treatment with OT; SLP facilitated session with increased wait time to verbally recall compensatory strategies and supervision verbal cues to utilize while consuming Dys.1 textures and nectar-thick liquids in a distracting group environment. Patient demonstrated an increase in throat clears as session progressed and SLP increased verbal cues to min assist level to utilize an extra swallow after completing a throat clear and reminded patient that despite his fatigue it is recommended that he sit up to reduce reflux.    Pain Pain Assessment Pain Assessment: No/denies pain  Therapy/Group: Group Therapy  [name]., CCC-SLP [phone]  Charlesetta Milliron 05/09/2012, 2:03 PM

## 2012-05-09 NOTE — Progress Notes (Signed)
Physical Therapy Session Note  Patient Details  Name: [name] MRN: [id number] Date of Birth: 12-24-1928  Today's Date: 05/09/2012 Time: 1300-1330 Time Calculation (min): 30 min   Skilled Therapeutic Interventions/Progress Updates:    Session focused on increased endurance. Pt very fatigued today likely secondary to low hemoglobin. Pt's blood transfusion started while resting during session. Pt performed standing activities working standing tolerance, single limb stance, and hip flexion strength to prepare for stairs. Pt declined going to gym today. Discussed future prosthesis options with pt.   Therapy Documentation Precautions:  Precautions Precautions: Fall Precaution Comments: NG tube, multiple decubitus lcers to sacrum and lower legs Required Braces or Orthoses: Other Brace/Splint Other Brace/Splint: pressure relieving heel protectors when in bed Restrictions Weight Bearing Restrictions: No Pain: Pain Assessment Pain Assessment: No/denies pain  See FIM for current functional status  Therapy/Group: Individual Therapy  Sherrine Maples Cheek 05/09/2012, 6:18 PM

## 2012-05-09 NOTE — Progress Notes (Signed)
Patient ID: Edword Cu, male   DOB: 16-Apr-1929, 78 y.o.   MRN: [id number] Subjective/Complaints: No problems last night. Slept well. No shortness of breath, only occasional cough. Denies pain. Daughter with multiple questions.  NG tube in and out several times, requiring fluoro placement No Swallow eval recently Appreciate ID consult A 12 point review of systems has been performed and if not noted above is otherwise negative.   Objective: Vital Signs: Blood pressure 114/53, pulse 68, temperature 98.4 F (36.9 C), temperature source Oral, resp. rate 17, height 5' 8.9" (1.75 m), weight 65.273 kg (143 lb 14.4 oz), SpO2 100.00%. Dg Swallowing Func-no Report  05/07/2012  CLINICAL DATA: dysphagia   FLUOROSCOPY FOR SWALLOWING FUNCTION STUDY:  Fluoroscopy was provided for swallowing function study, which was  administered by a speech pathologist.  Final results and recommendations  from this study are contained within the speech pathology report.      Basename 05/09/12 0545 05/06/12 0856  WBC 19.5* 34.0*  HGB 5.9* 7.9*  HCT 18.0* 25.0*  PLT 407* 501*    Basename 05/09/12 0545 05/06/12 0856  NA 131* 131*  K 4.1 4.3  CL 95* 92*  CO2 30 32  GLUCOSE 223* 115*  BUN 22 22  CREATININE 0.64 0.66  CALCIUM 8.1* 8.9   CBG (last 3)   Basename 05/09/12 0746 05/09/12 0419 05/08/12 2358  GLUCAP 170* 249* 383*    Wt Readings from Last 3 Encounters:  05/08/12 65.273 kg (143 lb 14.4 oz)  04/26/12 65 kg (143 lb 4.8 oz)  04/26/12 65 kg (143 lb 4.8 oz)    Physical Exam:  HENT:  Head: Normocephalic.  NGT in place but appears to be pulled out 8 inches or so. Missing many teeth. Mucosa pink but with white film over tongue and pharynx. Phonation better Eyes:  Pupils round and reactive to light  Neck: Neck supple. No thyromegaly present.  Cardiovascular: Regular rhythm. Normal rate Pulmonary/Chest: Breath sounds normal. No respiratory distress. He has no wheezes. Occasional upper airway sounds  heard as well as rhonchi Abdominal: Bowel sounds are normal. He exhibits no distension.  Neurological: He is alert.  Patient is a poor historian. He was able to give his date of birth and age.   He would follow simple commands. limited insight and awareness. He had difficulty controlling his secretions and a weak cough. Strength was symmetrical but he was weak more proximally than distally (2-3 prox to 4 distally). No focal sensory loss was discerned.  Skin:  Skin: 4cm lateral mallelolus wound with fibronecrotic tissue which is sloughing. Buttock areas with fibronecrotic tissue also (x 2) about 1-2cm in diameter Psychiatric:  Patient with improved awareness and insight   Assessment/Plan: 1. Functional deficits secondary to sepsis/deconditioning which require 3+ hours per day of interdisciplinary therapy in a comprehensive inpatient rehab setting. Physiatrist is providing close team supervision and 24 hour management of active medical problems listed below. Physiatrist and rehab team continue to assess barriers to discharge/monitor patient progress toward functional and medical goals.  Will need to discuss with team regarding fxnl gains/plan considering need for BKA. Target date for potential surgery is close to when he wa scheduled to go home from here. Most likley scenario would be for him to go from here to surgery.   FIM: FIM - Bathing Bathing Steps Patient Completed: Chest;Left Arm;Abdomen;Front perineal area;Right Arm;Buttocks;Right upper leg;Left upper leg Bathing: 4: Min-Patient completes 8-9 31f 10 parts or 75+ percent  FIM - Upper Body Dressing/Undressing Upper body  dressing/undressing steps patient completed: Thread/unthread right sleeve of pullover shirt/dresss;Thread/unthread left sleeve of pullover shirt/dress;Put head through opening of pull over shirt/dress;Pull shirt over trunk Upper body dressing/undressing: 5: Set-up assist to: Obtain clothing/put away FIM - Lower Body  Dressing/Undressing Lower body dressing/undressing steps patient completed: Thread/unthread right underwear leg;Thread/unthread left underwear leg;Pull underwear up/down;Thread/unthread right pants leg;Thread/unthread left pants leg;Pull pants up/down Lower body dressing/undressing: 4: Min-Patient completed 75 plus % of tasks  FIM - Toileting Toileting: 0: Activity did not occur  FIM - Diplomatic Services operational officer Devices: Grab bars;Elevated toilet seat Toilet Transfers: 0-Activity did not occur  FIM - Banker Devices: Therapist, occupational: 5: Supine > Sit: Supervision (verbal cues/safety issues);5: Bed > Chair or W/C: Supervision (verbal cues/safety issues)  FIM - Locomotion: Wheelchair Distance: 150' Locomotion: Wheelchair: 5: Travels 150 ft or more: maneuvers on rugs and over door sills with supervision, cueing or coaxing FIM - Locomotion: Ambulation Locomotion: Ambulation Assistive Devices: Designer, industrial/product Ambulation/Gait Assistance: 5: Supervision Locomotion: Ambulation: 2: Travels 50 - 149 ft with supervision/safety issues  Comprehension Comprehension Mode: Auditory Comprehension: 5-Understands basic 90% of the time/requires cueing < 10% of the time  Expression Expression Mode: Verbal Expression: 5-Expresses basic 90% of the time/requires cueing < 10% of the time.  Social Interaction Social Interaction: 5-Interacts appropriately 90% of the time - Needs monitoring or encouragement for participation or interaction.  Problem Solving Problem Solving: 4-Solves basic 75 - 89% of the time/requires cueing 10 - 24% of the time  Memory Memory: 4-Recognizes or recalls 75 - 89% of the time/requires cueing 10 - 24% of the time Medical Problem List and Plan:  1. deconditioning related to sepsis multiple medical complications/metabolic encephalopathy/respiratory failure. Will discuss with critical care medicine on weaning of  prednisone  2. DVT Prophylaxis/Anticoagulation: Subcutaneous heparin. Monitor platelet counts and any signs of bleeding  3. Pain Management: Lortab as needed. Monitor with increased mobility  4. Neuropsych: This patient is not capable of making decisions on his/her own behalf.  5. Dysphagia. -off ivf  -phonation is better and throat is less painful  -on D1 nectars currently  -he's eating fairly well.  -labs look good today   6.NSTEMI. Followup per cardiology services. Continue aspirin therapy as well as Lopressor 12.5 mg twice a day (hold today). No current chest pain or shortness of breath  7. Esophageal candidiasis. Diflucan  -nystatin to tongue 8. Diabetes mellitus with peripheral neuropathy. Hemoglobin A1c 10.5. Lantus insulin 15 units at bedtime. added am lantus as well to better cover pm cbgs.  Check blood sugars a.c. and at bedtime   -continue to titrate further as indicated- sugars have been quite variable recently. Hesitate making further changes at present. 9. Right lateral malleolus osteomyelitis- per ortho 10. Anemia. Further drop in hgb today to 5.9. No signs of blood loss. Verify results. Probably will need a transfusion 11. Hypothyroidism. Synthroid  12. Medical noncompliance. Will discuss at length with patient and family the need to maintain medical regimen 13. Leukocytosis: wbc's continue to rise. He has been on chronic prednisone and has osteomyelitis and UTI  - Since surgical cultures may be obtained will hold on antiobiotics unless pt is febrile and more symptomatic from UTI  -Dr. Lajoyce Corners is recommending a right BKA next week. Pt is ok with this as long it "gets me home" 14. Hyponatremia: follow up labs tomorrow  -on diet currently  -  LOS (Days) 13 A FACE TO FACE EVALUATION WAS PERFORMED  SWARTZ,ZACHARY T 05/09/2012, 7:56 AM

## 2012-05-09 NOTE — Progress Notes (Signed)
Notified Dan Angiulli, PA of blood sugar of 533.  No insulin orders at this time, will recheck at 1930.

## 2012-05-09 NOTE — Progress Notes (Signed)
CRITICAL VALUE ALERT  Critical value received:  Hemoglobin 5.9  Date of notification:  05/09/12  Time of notification:  6:20  Critical value read back:yes  Nurse who received alert: Trudee Kuster  MD notified (1st page):  Deatra Ina PA (in  office)  Time of first page:  n/a  MD notified (2nd page):  Time of second page:  Responding MD:  Deatra Ina PA  Time MD responded: 6:21am

## 2012-05-09 NOTE — Progress Notes (Signed)
Mathis Fare PA at [phone] for BS of 527. Orders to give 9 units and recheck as scheduled at 12a.

## 2012-05-09 NOTE — Progress Notes (Signed)
Nutrition Follow-up  Intervention:   1. Continue current interventions 2. Education needs to be addressed closer to d/c. 3. RD to continue to follow  Assessment:   Continues on Dysphagia 1 diet with nectar thickened liquids. Pt consuming mostly 75-85% of meals. States that he feels as though he is eating well. Enjoys the Ensure Pudding and would like to continue it.  Diet Order:  Dysphagia 1 with nectar thickened liquids Supplements: Ensure Pudding PO TID  Meds: Scheduled Meds:    . acetaminophen  650 mg Oral Once  . aspirin  81 mg Oral Daily  . collagenase   Topical Daily  . diphenhydrAMINE  25 mg Oral Once  . famotidine  20 mg Oral Daily  . feeding supplement  1 Container Oral TID BM  . ferrous sulfate  325 mg Oral TID WC  . furosemide  20 mg Intravenous Once  . heparin subcutaneous  5,000 Units Subcutaneous Q8H  . insulin aspart  0-9 Units Subcutaneous Q4H  . levothyroxine  75 mcg Oral QAC breakfast  . metoprolol tartrate  12.5 mg Oral BID  . nystatin  5 mL Mouth/Throat QID  . predniSONE  30 mg Oral Q breakfast  . DISCONTD: insulin aspart  0-20 Units Subcutaneous Q4H  . DISCONTD: predniSONE  40 mg Oral Q breakfast   Continuous Infusions:   PRN Meds:.albuterol, antiseptic oral rinse, food thickener, HYDROcodone-acetaminophen, ipratropium, ondansetron (ZOFRAN) IV, ondansetron, sodium chloride, sorbitol  Labs:  CMP     Component Value Date/Time   NA 131* 05/09/2012 0545   K 4.1 05/09/2012 0545   CL 95* 05/09/2012 0545   CO2 30 05/09/2012 0545   GLUCOSE 223* 05/09/2012 0545   BUN 22 05/09/2012 0545   CREATININE 0.64 05/09/2012 0545   CALCIUM 8.1* 05/09/2012 0545   PROT 5.8* 05/03/2012 0655   ALBUMIN 1.4* 05/03/2012 0655   AST 18 05/03/2012 0655   ALT 18 05/03/2012 0655   ALKPHOS 123* 05/03/2012 0655   BILITOT 0.1* 05/03/2012 0655   GFRNONAA 89* 05/09/2012 0545   GFRAA >90 05/09/2012 0545   CBG (last 3)   Basename 05/09/12 0746 05/09/12 0419 05/08/12 2358  GLUCAP 170* 249* 383*    Lab Results  Component Value Date   HGBA1C 10.5* 04/06/2012    Intake/Output Summary (Last 24 hours) at 05/09/12 1105 Last data filed at 05/09/12 0900  Gross per 24 hour  Intake    435 ml  Output   1050 ml  Net   -615 ml  BM 9/3  Weight Status:  65.3 kg - stable  Estimated needs:  1700 - 1800 kcal, 90 - 115 grams protein  Nutrition Dx:  Inadequate oral intake r/t inability to eat AEB NPO status. Resolved.  New Nutrition Dx: Swallowing difficulty r/t dysphagia AEB need for ST and thickened liquids. Ongoing.  Goal:  Pt to meet >/= 90% of their estimated nutrition needs - improving  Monitor:  weights, labs, supplement tolerance, I/O's, diet advancement   Jarold Motto MS, RD, LDN Pager: [phone] After-hours pager: [phone]

## 2012-05-09 NOTE — Progress Notes (Signed)
Notified Dan Angiulli, PA of blood sugar 573.  5 units Novolog ordered and given.

## 2012-05-09 NOTE — Progress Notes (Signed)
Occupational Therapy Session Note  Patient Details  Name: [name] MRN: [id number] Date of Birth: April 09, 1929  Today's Date: 05/09/2012 Time: 1130-1200 Time Calculation (min): 30 min   Skilled Therapeutic Interventions/Progress Updates: Patient participated in [employer] today to improve his ability to feed himself.  Patient, who has had a rather flat affect, much more verbal today, interacting with members of the group and smiling. Patient discussed plans to amputate foot, lower leg due to diabetic ulcer.  Patient does not seemed concerned or upset, just sharing factual information.      Therapy Documentation Precautions:  Fall   Pain: Pain Assessment Pain Assessment: No/denies pain  See FIM for current functional status  Therapy/Group: Group Therapy  [name] 05/09/2012, 4:07 PM

## 2012-05-09 NOTE — Progress Notes (Signed)
Inpatient Diabetes Program Recommendations  AACE/ADA: New Consensus Statement on Inpatient Glycemic Control (2013)  Target Ranges:  Prepandial:   less than 140 mg/dL      Peak postprandial:   less than 180 mg/dL (1-2 hours)      Critically ill patients:  140 - 180 mg/dL   Reason for Visit: Results for DACOTAH, CABELLO (MRN [id number]) as of 05/09/2012 12:57  Ref. Range 05/08/2012 20:51 05/08/2012 23:58 05/09/2012 04:19 05/09/2012 07:46 05/09/2012 11:07  Glucose-Capillary Latest Range: 70-99 mg/dL 147 (H) 829 (H) 562 (H) 170 (H) 226 (H)    Note: Please consider restarting Lantus 15 units daily.  Will follow.

## 2012-05-09 NOTE — Progress Notes (Signed)
Occupational Therapy Session Note  Patient Details  Name: [name] MRN: [id number] Date of Birth: 1929-05-10  Today's Date: 05/09/2012 Time: 0800-0855 Time Calculation (min): 55 min  Short Term Goals: Week 2:  OT Short Term Goal 1 (Week 2): Pt will perform lower body bathing with min A. OT Short Term Goal 2 (Week 2): Pt will perform toilet transfers with min assist using RW to 3:1 OT Short Term Goal 3 (Week 2): Pt will perform toileting tasks (3/3) with steady assist. OT Short Term Goal 4 (Week 2): Pt will perform lower body dressing with min A.  Skilled Therapeutic Interventions/Progress Updates:    Pt in bed upon arrival in room but agreeable to participating in bathing and dressing tasks at sink.  Pt performed bed to w/c transfer with supervision using RW.  Pt completed all tasks with supervision but required rest breaks between each segment of bathing and dressing.  Pt O2 sats>90% on 2L O2 throughout session.  Focus on activity tolerance, standing balance, and safety awareness.  Therapy Documentation Precautions:  Precautions Precautions: Fall Precaution Comments: NG tube, multiple decubitus lcers to sacrum and lower legs Required Braces or Orthoses: Other Brace/Splint Other Brace/Splint: pressure relieving heel protectors when in bed Restrictions Weight Bearing Restrictions: No General:   Pain: Pain Assessment Pain Assessment: No/denies pain  See FIM for current functional status  Therapy/Group: Individual Therapy  [name] 05/09/2012, 8:58 AM

## 2012-05-10 ENCOUNTER — Inpatient Hospital Stay (HOSPITAL_COMMUNITY): Payer: Medicare Other | Admitting: Occupational Therapy

## 2012-05-10 ENCOUNTER — Inpatient Hospital Stay (HOSPITAL_COMMUNITY): Payer: Medicare Other | Admitting: Speech Pathology

## 2012-05-10 ENCOUNTER — Inpatient Hospital Stay (HOSPITAL_COMMUNITY): Payer: Medicare Other | Admitting: Physical Therapy

## 2012-05-10 DIAGNOSIS — M86179 Other acute osteomyelitis, unspecified ankle and foot: Secondary | ICD-10-CM

## 2012-05-10 DIAGNOSIS — Z5189 Encounter for other specified aftercare: Secondary | ICD-10-CM

## 2012-05-10 DIAGNOSIS — A4189 Other specified sepsis: Secondary | ICD-10-CM

## 2012-05-10 DIAGNOSIS — B3781 Candidal esophagitis: Secondary | ICD-10-CM

## 2012-05-10 DIAGNOSIS — R1310 Dysphagia, unspecified: Secondary | ICD-10-CM

## 2012-05-10 DIAGNOSIS — R5381 Other malaise: Secondary | ICD-10-CM

## 2012-05-10 LAB — TYPE AND SCREEN
ABO/RH(D): O POS
Antibody Screen: NEGATIVE
Unit division: 0

## 2012-05-10 LAB — GLUCOSE, CAPILLARY
Glucose-Capillary: 174 mg/dL — ABNORMAL HIGH (ref 70–99)
Glucose-Capillary: 265 mg/dL — ABNORMAL HIGH (ref 70–99)
Glucose-Capillary: 269 mg/dL — ABNORMAL HIGH (ref 70–99)
Glucose-Capillary: 379 mg/dL — ABNORMAL HIGH (ref 70–99)
Glucose-Capillary: 440 mg/dL — ABNORMAL HIGH (ref 70–99)

## 2012-05-10 MED ORDER — INSULIN NPH (HUMAN) (ISOPHANE) 100 UNIT/ML ~~LOC~~ SUSP
10.0000 [IU] | Freq: Two times a day (BID) | SUBCUTANEOUS | Status: DC
  Filled 2012-05-10: qty 10

## 2012-05-10 MED ORDER — INSULIN GLARGINE 100 UNIT/ML ~~LOC~~ SOLN
10.0000 [IU] | Freq: Every day | SUBCUTANEOUS | Status: DC
  Administered 2012-05-10 – 2012-05-11 (×2): 10 [IU] via SUBCUTANEOUS

## 2012-05-10 MED ORDER — INSULIN ASPART 100 UNIT/ML ~~LOC~~ SOLN
7.0000 [IU] | Freq: Once | SUBCUTANEOUS | Status: AC
  Administered 2012-05-10: 7 [IU] via SUBCUTANEOUS

## 2012-05-10 MED ORDER — INSULIN ASPART 100 UNIT/ML ~~LOC~~ SOLN
15.0000 [IU] | Freq: Once | SUBCUTANEOUS | Status: DC
  Administered 2012-05-10: 15 [IU] via SUBCUTANEOUS

## 2012-05-10 MED ORDER — INSULIN ASPART 100 UNIT/ML ~~LOC~~ SOLN
12.0000 [IU] | Freq: Once | SUBCUTANEOUS | Status: AC
  Administered 2012-05-10: 12 [IU] via SUBCUTANEOUS

## 2012-05-10 NOTE — Significant Event (Signed)
Hypoglycemic Event  CBG: 441  Treatment: 7 units  Symptoms: sleepy  Follow-up CBG: Time:1815 CBG Result:440  Possible Reasons for Event:change in meds/prednisone  Comments/MD notified:[name] PA; new orders received    [name] B  Remember to initiate Hypoglycemia Order Set & complete

## 2012-05-10 NOTE — Significant Event (Signed)
Hypoglycemic Event  CBG: 440  Treatment: 15 units novolog  Symptoms: none  Follow-up CBG: Time:2000 CBG Result:pending  Possible Reasons for Event: change in  meds  Comments/MD notified:Dr Jamal Maes, Binnie Rail  Remember to initiate Hypoglycemia Order Set & complete

## 2012-05-10 NOTE — Significant Event (Signed)
hypoHypoglycemic Event  CBG: 489  Treatment: 12 units novolog  Symptoms: none  Follow-up CBG: Time:1715 CBG Result:441  Possible Reasons for Event:change in meds/prednisone  Comments/MD notified:Dan Anquilla PA; new orders received give additional 7 units    Mikalah Skyles B  Remember to initiate Hypoglycemia Order Set & complete

## 2012-05-10 NOTE — Progress Notes (Signed)
Speech Language Pathology Daily Session Note  Patient Details  Name: Dilraj Killgore MRN: [id number] Date of Birth: 1928-10-13  Today's Date: 05/10/2012 Time: 5621-3086 Time Calculation (min): 30 min  Short Term Goals: Week 2: SLP Short Term Goal 1 (Week 2): Pt will participate in objective swallow study to asess dysphagia  SLP Short Term Goal 1 - Progress (Week 2): Met SLP Short Term Goal 2 (Week 2): Pt will utilize swallowing compensatory strategies with Min A verbal cues. SLP Short Term Goal 3 (Week 2): Pt will recall swallowing compensatory strategies with Min A semantic cues.   Skilled Therapeutic Interventions: Co-treatment with OT; SLP facilitated session with supervision level verbal cues to utilize safe swallow compensatory strategies while consuming Dys.1 textures and nectar-thick liquids via cup in a distracting group environment. Patient demonstrated an increase in throat clears at end of meal and SLP suspects it's related to reflux; as a result SLP reminded patient to utilize extra swallows after completing a throat clear.  Patient verbalized that he preferred the Dys.1 texture due to his dentition but reported the meats were a little grainy at times.     Pain Pain Assessment Pain Assessment: No/denies pain  Therapy/Group: Group Therapy  [name]., CCC-SLP [phone]  Samie Reasons 05/10/2012, 1:32 PM

## 2012-05-10 NOTE — Progress Notes (Signed)
Speech Language Pathology Daily Session Note  Patient Details  Name: [name] MRN: [id number] Date of Birth: 1929-02-04  Today's Date: 05/10/2012 Time: 4098-1191 Time Calculation (min): 30 min  Short Term Goals: Week 2: SLP Short Term Goal 1 (Week 2): Pt will participate in objective swallow study to asess dysphagia  SLP Short Term Goal 1 - Progress (Week 2): Met SLP Short Term Goal 2 (Week 2): Pt will utilize swallowing compensatory strategies with Min A verbal cues. SLP Short Term Goal 3 (Week 2): Pt will recall swallowing compensatory strategies with Min A semantic cues.   Skilled Therapeutic Interventions: Treatment session focused on addressing dysphagia goals. SLP facilitated session with trials of water via tsp with supervision verbal cues to perform throat clear/cough followed by another swallow. Patient consumed ~4oz water and displayed no overt s/s of aspiration during trials. Pt requires further education in regards to types of foods that are appropriate for his current diet.    FIM:  Comprehension Comprehension Mode: Auditory Comprehension: 5-Understands basic 90% of the time/requires cueing < 10% of the time Expression Expression Mode: Verbal Expression: 5-Expresses basic 90% of the time/requires cueing < 10% of the time. Social Interaction Social Interaction: 6-Interacts appropriately with others with medication or extra time (anti-anxiety, antidepressant). Problem Solving Problem Solving: 4-Solves basic 75 - 89% of the time/requires cueing 10 - 24% of the time Memory Memory: 4-Recognizes or recalls 75 - 89% of the time/requires cueing 10 - 24% of the time FIM - Eating Eating Activity: 5: Supervision/cues  Pain Pain Assessment Pain Assessment: No/denies pain  Therapy/Group: Individual Therapy  [name] 05/10/2012, 2:45 PM

## 2012-05-10 NOTE — Progress Notes (Signed)
Physical Therapy Weekly Progress Note  Patient Details  Name: Dekari Bures MRN: [id number] Date of Birth: June 25, 1929  Today's Date: 05/10/2012 Time: 4098-1191 Time Calculation (min): 59 min  Patient has met 3 of 3 short term goals.    Patient continues to demonstrate the following deficits: significantly impaired endurance, decreased strength, decreased ability to ambulate into/out of house safely, impaired balance and therefore will continue to benefit from skilled PT intervention to enhance overall performance with activity tolerance, balance and ability to compensate for deficits.  Patient progressing toward long term goals..  Continue plan of care.  PT Short Term Goals Week 2:  PT Short Term Goal 1 (Week 2): Pt will perform stand pivot transfers with supervision PT Short Term Goal 1 - Progress (Week 2): Met PT Short Term Goal 2 (Week 2): Pt will tolerate standing activity for 3 min continuous.  PT Short Term Goal 2 - Progress (Week 2): Met PT Short Term Goal 3 (Week 2): Pt will verbalize importance of pressure relief and unweighting heels in bed for skin preservation. PT Short Term Goal 3 - Progress (Week 2): Met  Skilled Therapeutic Interventions/Progress Updates:    Pt taken outside per request. Performed wheelchair mobility x 50', 75', 50' for upper body strengthening and negotiating incline/declines, negotiating obstacles performed with up to min assist. Nustep performed x 4 min, level 2. Standing balance/activity tolerance x 3 min continuous. Pt continues to need extended rest breaks throughout session to tolerate therapy.    SpO2 >95% on room air however pt became short winded on NuStep and 2L [place] O2 added, unable to get accurate SpO2 reading until SpO2 94% again.   Therapy Documentation Precautions:  Precautions Precautions: Fall Precaution Comments: NG tube, multiple decubitus lcers to sacrum and lower legs Required Braces or Orthoses: Other Brace/Splint Other Brace/Splint:  pressure relieving heel protectors when in bed Restrictions Weight Bearing Restrictions: No Pain: Pain Assessment Pain Assessment: No/denies pain  See FIM for current functional status  Therapy/Group: Individual Therapy  [name] 05/10/2012, 5:20 PM

## 2012-05-10 NOTE — Progress Notes (Signed)
Occupational Therapy Weekly Progress Note  Patient Details  Name: [name] MRN: [id number] Date of Birth: 1929/05/15  Today's Date: 05/10/2012  Patient has met 2 of 4 short term goals. Pt has made slow and inconsistent progress with bathing and dressing tasks this past week.  Pt continues to exhibit decreased activity tolerance and requires assistance completing LB BADLs.  Occasionally patient requires encouragement to participate in therapy and would prefer to lay in bed.    Patient continues to demonstrate the following deficits: decreased overall activity tolerance/endurance, decreased independence with functional mobility, decreased functional use of bilateral UEs, decreased sit/stand independence, decreased dynamic standing balance/tolerance/endurance. Therefore, patient will continue to benefit from skilled OT intervention to enhance overall performance with BADL, iADL and Reduce care partner burden.  Patient not progressing toward long term goals.  See goal revision..  Plan of care revisions: See Care Plan/Paths.  OT Short Term Goals Week 2:  OT Short Term Goal 1 (Week 2): Pt will perform lower body bathing with min A. OT Short Term Goal 1 - Progress (Week 2): Progressing toward goal OT Short Term Goal 2 (Week 2): Pt will perform toilet transfers with min assist using RW to 3:1 OT Short Term Goal 2 - Progress (Week 2): Met OT Short Term Goal 3 (Week 2): Pt will perform toileting tasks (3/3) with steady assist. OT Short Term Goal 3 - Progress (Week 2): Met OT Short Term Goal 4 (Week 2): Pt will perform lower body dressing with min A. OT Short Term Goal 4 - Progress (Week 2): Progressing toward goal Week 3:   STG=LTG   Precautions:  Precautions Precautions: Fall Precaution Comments: NG tube, multiple decubitus lcers to sacrum and lower legs Required Braces or Orthoses: Other Brace/Splint Other Brace/Splint: pressure relieving heel protectors when in bed Restrictions Weight  Bearing Restrictions: No  Pain: Pain Assessment Pain Assessment: No/denies pain  ADL: Grooming: Supervision/safety Where Assessed-Grooming: Edge of bed Upper Body Bathing: Supervision/safety Where Assessed-Upper Body Bathing: Edge of bed Lower Body Bathing: Moderate assistance Where Assessed-Lower Body Bathing: Edge of bed Upper Body Dressing: Minimal assistance Where Assessed-Upper Body Dressing: Edge of bed Lower Body Dressing: Dependent Where Assessed-Lower Body Dressing: Edge of bed Toileting: Steady Assist Where Assessed-Toileting: Teacher, adult education: Curator Method: Ambulating (with use of RW) Tub/Shower Transfer: Not assessed Film/video editor: Not assessed ADL Comments: Pt with limited endurance and decreased flexibility to reach his feet for dressing tasks.     See FIM for current functional status  [name] 05/10/2012, 2:37 PM

## 2012-05-10 NOTE — Progress Notes (Signed)
Occupational Therapy Session Note  Patient Details  Name: [name] MRN: [id number] Date of Birth: 06-18-1929  Today's Date: 05/10/2012 Time: 0815-0910 Time Calculation (min): 55 min  Short Term Goals: Week 1:  OT Short Term Goal 1 (Week 1): Pt will tolerate 3 mins standing during grooming activities before needing to rest. OT Short Term Goal 1 - Progress (Week 1): Met OT Short Term Goal 2 (Week 1): Pt will perform LB bathing and dressing with min assist sit to stand with AE PRN. OT Short Term Goal 2 - Progress (Week 1): Progressing toward goal OT Short Term Goal 3 (Week 1): Pt will perform toilet transfers with min assist using RW to 3:1 OT Short Term Goal 3 - Progress (Week 1): Progressing toward goal OT Short Term Goal 4 (Week 1): Pt will perform BUE light therex with suprevision to increase overall endurance for basic selfcare tasks and functional transfers. OT Short Term Goal 4 - Progress (Week 1): Met  Week 2:  OT Short Term Goal 1 (Week 2): Pt will perform lower body bathing with min A. OT Short Term Goal 2 (Week 2): Pt will perform toilet transfers with min assist using RW to 3:1 OT Short Term Goal 3 (Week 2): Pt will perform toileting tasks (3/3) with steady assist. OT Short Term Goal 4 (Week 2): Pt will perform lower body dressing with min A.  Skilled Therapeutic Interventions/Progress Updates:  Patient found supine in bed with no complaints of pain. Engaged in bed mobility for edge of bed -> w/c squat pivot transfer with steady assist. Patient then engaged in ADL retraining at sink level in sit -> stand position. Patient with incontinent BM and urine. Notified RN to change dressing around sacrum secondary to incontinent BM. Patient fatigued throughout session, taking multiple rest breaks and with complaints of, "I don't want to do this, I want to lay down and rest.". Focused skilled intervention on overall activity tolerance/edurance, energy conservation prn, motivation &  encouragement, sit/stands, and ADL retraining. At end of session left patient seated in recliner, per his request, with call bell & phone within reach.   Precautions:  Precautions Precautions: Fall Precaution Comments: NG tube, multiple decubitus lcers to sacrum and lower legs Required Braces or Orthoses: Other Brace/Splint Other Brace/Splint: pressure relieving heel protectors when in bed Restrictions Weight Bearing Restrictions: No  See FIM for current functional status  Therapy/Group: Individual Therapy  Donelda Mailhot 05/10/2012, 9:12 AM

## 2012-05-10 NOTE — Progress Notes (Signed)
Patient ID: [name], male   DOB: 05/12/1929, 78 y.o.   MRN: [id number] Subjective/Complaints: Sugars out of control yesterdauy Daughter with multiple questions.  NG tube in and out several times, requiring fluoro placement No Swallow eval recently Appreciate ID consult A 12 point review of systems has been performed and if not noted above is otherwise negative.   Objective: Vital Signs: Blood pressure 123/49, pulse 68, temperature 98.3 F (36.8 C), temperature source Oral, resp. rate 17, height 5' 8.9" (1.75 m), weight 65.273 kg (143 lb 14.4 oz), SpO2 93.00%. No results found.  Basename 05/09/12 0835 05/09/12 0545  WBC 18.7* 19.5*  HGB 6.6* 5.9*  HCT 20.0* 18.0*  PLT 409* 407*    Basename 05/09/12 0545  NA 131*  K 4.1  CL 95*  CO2 30  GLUCOSE 223*  BUN 22  CREATININE 0.64  CALCIUM 8.1*   CBG (last 3)   Basename 05/10/12 0710 05/10/12 0407 05/10/12 0003  GLUCAP 187* 265* 269*    Wt Readings from Last 3 Encounters:  05/08/12 65.273 kg (143 lb 14.4 oz)  04/26/12 65 kg (143 lb 4.8 oz)  04/26/12 65 kg (143 lb 4.8 oz)    Physical Exam:  HENT:  Head: Normocephalic.  NGT in place but appears to be pulled out 8 inches or so. Missing many teeth. Mucosa pink but with white film over tongue and pharynx. Phonation better Eyes:  Pupils round and reactive to light  Neck: Neck supple. No thyromegaly present.  Cardiovascular: Regular rhythm. Normal rate Pulmonary/Chest: Breath sounds normal. No respiratory distress. He has no wheezes. Occasional upper airway sounds heard as well as rhonchi Abdominal: Bowel sounds are normal. He exhibits no distension.  Neurological: He is alert.  Patient is a poor historian. He was able to give his date of birth and age.   He would follow simple commands. limited insight and awareness. He had difficulty controlling his secretions and a weak cough. Strength was symmetrical but he was weak more proximally than distally (2-3 prox to 4 distally).  No focal sensory loss was discerned.  Skin:  Skin: 4cm lateral mallelolus wound with fibronecrotic tissue which is sloughing. Buttock areas with fibronecrotic tissue also (x 2) about 1-2cm in diameter Psychiatric:  Patient with improved awareness and insight   Assessment/Plan: 1. Functional deficits secondary to sepsis/deconditioning which require 3+ hours per day of interdisciplinary therapy in a comprehensive inpatient rehab setting. Physiatrist is providing close team supervision and 24 hour management of active medical problems listed below. Physiatrist and rehab team continue to assess barriers to discharge/monitor patient progress toward functional and medical goals.  Will need to discuss with team regarding fxnl gains/plan considering need for BKA. Target date for potential surgery is close to when he wa scheduled to go home from here. Most likley scenario would be for him to go from here to surgery.   FIM: FIM - Bathing Bathing Steps Patient Completed: Chest;Right Arm;Left Arm;Abdomen;Front perineal area;Buttocks;Right upper leg;Left upper leg Bathing: 4: Min-Patient completes 8-9 50f 10 parts or 75+ percent  FIM - Upper Body Dressing/Undressing Upper body dressing/undressing steps patient completed: Thread/unthread right sleeve of pullover shirt/dresss;Thread/unthread left sleeve of pullover shirt/dress;Put head through opening of pull over shirt/dress;Pull shirt over trunk Upper body dressing/undressing: 5: Set-up assist to: Obtain clothing/put away FIM - Lower Body Dressing/Undressing Lower body dressing/undressing steps patient completed: Thread/unthread right pants leg;Thread/unthread left pants leg;Pull pants up/down Lower body dressing/undressing: 4: Min-Patient completed 75 plus % of tasks  FIM - Toileting  Toileting: 0: Activity did not occur  FIM - Diplomatic Services operational officer Devices: Grab bars;Elevated toilet seat Toilet Transfers: 0-Activity did not  occur  FIM - Banker Devices: Therapist, occupational: 5: Supine > Sit: Supervision (verbal cues/safety issues);5: Bed > Chair or W/C: Supervision (verbal cues/safety issues)  FIM - Locomotion: Wheelchair Distance: 150' Locomotion: Wheelchair: 5: Travels 150 ft or more: maneuvers on rugs and over door sills with supervision, cueing or coaxing FIM - Locomotion: Ambulation Locomotion: Ambulation Assistive Devices: Designer, industrial/product Ambulation/Gait Assistance: 5: Supervision Locomotion: Ambulation: 2: Travels 50 - 149 ft with supervision/safety issues  Comprehension Comprehension Mode: Auditory Comprehension: 5-Understands basic 90% of the time/requires cueing < 10% of the time  Expression Expression Mode: Verbal Expression: 5-Expresses basic 90% of the time/requires cueing < 10% of the time.  Social Interaction Social Interaction: 5-Interacts appropriately 90% of the time - Needs monitoring or encouragement for participation or interaction.  Problem Solving Problem Solving: 4-Solves basic 75 - 89% of the time/requires cueing 10 - 24% of the time  Memory Memory: 4-Recognizes or recalls 75 - 89% of the time/requires cueing 10 - 24% of the time Medical Problem List and Plan:  1. deconditioning related to sepsis multiple medical complications/metabolic encephalopathy/respiratory failure. Will discuss with critical care medicine on weaning of prednisone  2. DVT Prophylaxis/Anticoagulation: Subcutaneous heparin. Monitor platelet counts and any signs of bleeding  3. Pain Management: Lortab as needed. Monitor with increased mobility  4. Neuropsych: This patient is not capable of making decisions on his/her own behalf.  5. Dysphagia. -off ivf  -phonation is better and throat is less painful  -on D1 nectars currently  -he's eating fairly well.   6.NSTEMI. Followup per cardiology services. Continue aspirin therapy as well as Lopressor 12.5 mg twice  a day (hold today). No current chest pain or shortness of breath  7. Esophageal candidiasis. Diflucan  -nystatin to tongue 8. Diabetes mellitus with peripheral neuropathy. Hemoglobin A1c 10.5. Lantus insulin 15 units at bedtime. added am lantus as well to better cover pm cbgs.  Check blood sugars a.c. and at bedtime   -continue to titrate further as indicated- sugars have been quite variable recently. Hesitate making further changes at present.  -will initiate lantus insulin as he was using this at home pta  -not sure of cause of sudden increase yesterday. Perhaps infection, diet, etc  -may need to put carb restrictions on diet 9. Right lateral malleolus osteomyelitis- per ortho 10. Anemia. Further drop in hgb today to 5.9. No signs of blood loss. Verify results. Probably will need a transfusion 11. Hypothyroidism. Synthroid  12. Medical noncompliance. Will discuss at length with patient and family the need to maintain medical regimen 13. Leukocytosis: wbc's continue to rise. He has been on chronic prednisone and has osteomyelitis and UTI  - Since surgical cultures may be obtained will hold on antiobiotics unless pt is febrile and more symptomatic from UTI  -Dr. Lajoyce Corners is recommending a right BKA next week. Pt is ok with this as long it "gets me home" 14. Hyponatremia: follow up labs  -on diet currently  -  LOS (Days) 14 A FACE TO FACE EVALUATION WAS PERFORMED  Everlina Gotts T 05/10/2012, 8:04 AM

## 2012-05-10 NOTE — Progress Notes (Signed)
Occupational Therapy Session Note  Patient Details  Name: [name] MRN: [id number] Date of Birth: [birth year]  Today's Date: 05/10/2012 Time: 1130-1145 Time Calculation (min): 15 min   Skilled Therapeutic Interventions/Progress Updates: Patient participated in [employer] today. Patient socializing with other group members more each day.  Patient stated today that he enjoyed pureed textures as he has no teeth to chew food.  Patient on room air today without difficulty.     Therapy Documentation Precautions:  Precautions Precautions: Fall Precaution Comments: NG tube, multiple decubitus lcers to sacrum and lower legs Required Braces or Orthoses: Other Brace/Splint Other Brace/Splint: pressure relieving heel protectors when in bed Restrictions Weight Bearing Restrictions: No   Pain: Pain Assessment Pain Assessment: No/denies pain  See FIM for current functional status  Therapy/Group: Group Therapy  [name] 05/10/2012, 1:31 PM

## 2012-05-11 ENCOUNTER — Inpatient Hospital Stay (HOSPITAL_COMMUNITY): Payer: Medicare Other | Admitting: Occupational Therapy

## 2012-05-11 ENCOUNTER — Inpatient Hospital Stay (HOSPITAL_COMMUNITY): Payer: Medicare Other | Admitting: *Deleted

## 2012-05-11 LAB — GLUCOSE, CAPILLARY
Glucose-Capillary: 84 mg/dL (ref 70–99)
Glucose-Capillary: 68 mg/dL — ABNORMAL LOW (ref 70–99)
Glucose-Capillary: 96 mg/dL (ref 70–99)
Glucose-Capillary: 361 mg/dL — ABNORMAL HIGH (ref 70–99)
Glucose-Capillary: 312 mg/dL — ABNORMAL HIGH (ref 70–99)

## 2012-05-11 LAB — CBC
HCT: 27.6 % — ABNORMAL LOW (ref 39.0–52.0)
Hemoglobin: 9.2 g/dL — ABNORMAL LOW (ref 13.0–17.0)
MCV: 94.2 fL (ref 78.0–100.0)
RBC: 2.93 MIL/uL — ABNORMAL LOW (ref 4.22–5.81)
WBC: 17.7 10*3/uL — ABNORMAL HIGH (ref 4.0–10.5)

## 2012-05-11 MED ORDER — INSULIN GLARGINE 100 UNIT/ML ~~LOC~~ SOLN
15.0000 [IU] | Freq: Every day | SUBCUTANEOUS | Status: DC
  Administered 2012-05-11: 15 [IU] via SUBCUTANEOUS

## 2012-05-11 NOTE — Progress Notes (Signed)
Physical Therapy Note  Patient Details  Name: [name] MRN: [id number] Date of Birth: [birth year] Today's Date: 05/11/2012  1300-1355 (55 minutes) group Pain: no complaint of pain Pt participated in PT group focused on gait training/safety/endurance Pt ambulates 80 feet X 2 with RW close SBA . Oxygen sats post ambulation 93 % on 2 L  [place]  Vanderbilt Ranieri,JIM 05/11/2012, 8:42 AM

## 2012-05-11 NOTE — Significant Event (Signed)
Hypoglycemic Event  CBG: 68   Treatment: 15 GM carbohydrate snack  Symptoms: None  Follow-up CBG: Time:0906 CBG Result 96 Possible Reasons for Event: Unknown  Comments/MD notified:Swartz notified- no new orders received    Yoceline Bazar, Sylvie Farrier  Remember to initiate Hypoglycemia Order Set & complete

## 2012-05-11 NOTE — Progress Notes (Signed)
Patient ID: [name], male   DOB: 08/24/1929, 78 y.o.   MRN: [id number] Subjective/Complaints: Sugars out of control yesterdauy Daughter with multiple questions.  NG tube in and out several times, requiring fluoro placement No Swallow eval recently Appreciate ID consult A 12 point review of systems has been performed and if not noted above is otherwise negative.   Objective: Vital Signs: Blood pressure 120/55, pulse 79, temperature 98 F (36.7 C), temperature source Oral, resp. rate 18, height 5' 8.9" (1.75 m), weight 65.273 kg (143 lb 14.4 oz), SpO2 95.00%. No results found.  Basename 05/11/12 0550 05/09/12 0835  WBC 17.7* 18.7*  HGB 9.2* 6.6*  HCT 27.6* 20.0*  PLT 379 409*    Basename 05/09/12 0545  NA 131*  K 4.1  CL 95*  CO2 30  GLUCOSE 223*  BUN 22  CREATININE 0.64  CALCIUM 8.1*   CBG (last 3)   Basename 05/11/12 0407 05/10/12 2354 05/10/12 1958  GLUCAP 84 229* 379*    Wt Readings from Last 3 Encounters:  05/08/12 65.273 kg (143 lb 14.4 oz)  04/26/12 65 kg (143 lb 4.8 oz)  04/26/12 65 kg (143 lb 4.8 oz)    Physical Exam:  HENT:  Head: Normocephalic.  NGT in place but appears to be pulled out 8 inches or so. Missing many teeth. Mucosa pink but with white film over tongue and pharynx. Phonation better Eyes:  Pupils round and reactive to light  Neck: Neck supple. No thyromegaly present.  Cardiovascular: Regular rhythm. Normal rate Pulmonary/Chest: Breath sounds normal. No respiratory distress. He has no wheezes. Occasional upper airway sounds heard as well as rhonchi Abdominal: Bowel sounds are normal. He exhibits no distension.  Neurological: He is alert.  Patient is a poor historian. He was able to give his date of birth and age.   He would follow simple commands. limited insight and awareness. He had difficulty controlling his secretions and a weak cough. Strength was symmetrical but he was weak more proximally than distally (2-3 prox to 4 distally). No  focal sensory loss was discerned.  Skin:  Skin: 4cm lateral mallelolus wound with fibronecrotic tissue which is sloughing. Buttock areas with fibronecrotic tissue also (x 2) about 1-2cm in diameter Psychiatric:  Patient with improved awareness and insight   Assessment/Plan: 1. Functional deficits secondary to sepsis/deconditioning which require 3+ hours per day of interdisciplinary therapy in a comprehensive inpatient rehab setting. Physiatrist is providing close team supervision and 24 hour management of active medical problems listed below. Physiatrist and rehab team continue to assess barriers to discharge/monitor patient progress toward functional and medical goals.  Will need to discuss with team regarding fxnl gains/plan considering need for BKA. Target date for potential surgery is close to when he wa scheduled to go home from here. Most likley scenario would be for him to go from here to surgery.   FIM: FIM - Bathing Bathing Steps Patient Completed: Chest;Right Arm;Left Arm;Abdomen;Right upper leg;Left upper leg Bathing: 3: Mod-Patient completes 5-7 34f 10 parts or 50-74%  FIM - Upper Body Dressing/Undressing Upper body dressing/undressing steps patient completed: Thread/unthread right sleeve of pullover shirt/dresss;Thread/unthread left sleeve of pullover shirt/dress;Put head through opening of pull over shirt/dress;Pull shirt over trunk Upper body dressing/undressing: 5: Set-up assist to: Obtain clothing/put away FIM - Lower Body Dressing/Undressing Lower body dressing/undressing steps patient completed: Thread/unthread right pants leg;Thread/unthread left pants leg;Pull pants up/down Lower body dressing/undressing: 3: Mod-Patient completed 50-74% of tasks  FIM - Toileting Toileting: 0: Activity did not  occur  FIM - Diplomatic Services operational officer Devices: Grab bars;Elevated toilet seat Toilet Transfers: 0-Activity did not occur  FIM - Physiological scientist Devices: Therapist, occupational: 5: Bed > Chair or W/C: Supervision (verbal cues/safety issues);5: Chair or W/C > Bed: Supervision (verbal cues/safety issues)  FIM - Locomotion: Wheelchair Distance: >150' Locomotion: Wheelchair: 5: Travels 150 ft or more: maneuvers on rugs and over door sills with supervision, cueing or coaxing FIM - Locomotion: Ambulation Locomotion: Ambulation Assistive Devices: Designer, industrial/product Ambulation/Gait Assistance: 5: Supervision Locomotion: Ambulation: 2: Travels 50 - 149 ft with supervision/safety issues  Comprehension Comprehension Mode: Auditory Comprehension: 5-Understands complex 90% of the time/Cues < 10% of the time  Expression Expression Mode: Verbal Expression: 5-Expresses complex 90% of the time/cues < 10% of the time  Social Interaction Social Interaction: 5-Interacts appropriately 90% of the time - Needs monitoring or encouragement for participation or interaction.  Problem Solving Problem Solving: 4-Solves basic 75 - 89% of the time/requires cueing 10 - 24% of the time  Memory Memory: 4-Recognizes or recalls 75 - 89% of the time/requires cueing 10 - 24% of the time Medical Problem List and Plan:  1. deconditioning related to sepsis multiple medical complications/metabolic encephalopathy/respiratory failure. Will discuss with critical care medicine on weaning of prednisone  2. DVT Prophylaxis/Anticoagulation: Subcutaneous heparin. Monitor platelet counts and any signs of bleeding  3. Pain Management: Lortab as needed. Monitor with increased mobility  4. Neuropsych: This patient is not capable of making decisions on his/her own behalf.  5. Dysphagia. -off ivf  -phonation is better and throat is less painful  -on D1 nectars currently  -he's eating fairly well.   6.NSTEMI. Followup per cardiology services. Continue aspirin therapy as well as Lopressor 12.5 mg twice a day (hold today). No current chest  pain or shortness of breath  7. Esophageal candidiasis. Diflucan  -nystatin to tongue 8. Diabetes mellitus with peripheral neuropathy. Hemoglobin A1c 10.5. Lantus insulin 15 units at bedtime. added am lantus as well to better cover pm cbgs.  Check blood sugars a.c. and at bedtime   -continue to titrate further as indicated- sugars have been quite variable recently. Hesitate making further changes at present.  -will further increase lantus insulin, adding an am dose has is pm sugars have been completely out of control the last 2 days.  he was using this at home pta   -may need to put carb restrictions on diet 9. Right lateral malleolus osteomyelitis- per ortho 10. Anemia. Further drop in hgb today to 5.9. No signs of blood loss. Verify results. Probably will need a transfusion 11. Hypothyroidism. Synthroid  12. Medical noncompliance. Will discuss at length with patient and family the need to maintain medical regimen 13. Leukocytosis: wbc's continue to rise. He has been on chronic prednisone and has osteomyelitis and UTI  - Since surgical cultures may be obtained will hold on antiobiotics unless pt is febrile and more symptomatic from UTI  -Dr. Lajoyce Corners is recommending a right BKA next week. Pt is ok with this as long it "gets me home" 14. Hyponatremia: follow up labs  -on diet currently  -  LOS (Days) 15 A FACE TO FACE EVALUATION WAS PERFORMED  Ashlon Lottman T 05/11/2012, 6:36 AM

## 2012-05-11 NOTE — Progress Notes (Signed)
Speech Language Pathology Daily Session Note  Patient Details  Name: [name] MRN: [id number] Date of Birth: 01-09-1929  Today's Date: 05/11/2012 Time: 1130-1230 Time Calculation (min): 60 min  Short Term Goals: Week 2: SLP Short Term Goal 1 (Week 2): Pt will participate in objective swallow study to asess dysphagia  SLP Short Term Goal 1 - Progress (Week 2): Met SLP Short Term Goal 2 (Week 2): Pt will utilize swallowing compensatory strategies with Min A verbal cues. SLP Short Term Goal 3 (Week 2): Pt will recall swallowing compensatory strategies with Min A semantic cues.   Skilled Therapeutic Interventions: Pt participating in dysphagia group; SLP facilitated session with supervision level verbal cues to utilize safe swallow compensatory strategies while consuming Dys.1 textures and nectar-thick liquids via cup in a distracting group environment. Patient demonstrated intermittent throat clearing throughout meal, pt reported utilization of throat clearing as a compensatory strategy and not out of necessity.    FIM:  Comprehension Comprehension Mode: Auditory Comprehension: 5-Follows basic conversation/direction: With no assist Expression Expression Mode: Verbal Expression: 5-Expresses basic needs/ideas: With extra time/assistive device Social Interaction Social Interaction: 5-Interacts appropriately 90% of the time - Needs monitoring or encouragement for participation or interaction. Problem Solving Problem Solving: 4-Solves basic 75 - 89% of the time/requires cueing 10 - 24% of the time Memory Memory: 4-Recognizes or recalls 75 - 89% of the time/requires cueing 10 - 24% of the time FIM - Eating Eating Activity: 5: Supervision/cues  Pain Pain Assessment Pain Assessment: No/denies pain  Therapy/Group: Group Therapy  Mattia Osterman 05/11/2012, 3:59 PM

## 2012-05-12 ENCOUNTER — Inpatient Hospital Stay (HOSPITAL_COMMUNITY): Payer: Medicare Other | Admitting: Physical Therapy

## 2012-05-12 LAB — GLUCOSE, CAPILLARY
Glucose-Capillary: 46 mg/dL — ABNORMAL LOW (ref 70–99)
Glucose-Capillary: 46 mg/dL — ABNORMAL LOW (ref 70–99)
Glucose-Capillary: 144 mg/dL — ABNORMAL HIGH (ref 70–99)
Glucose-Capillary: 162 mg/dL — ABNORMAL HIGH (ref 70–99)

## 2012-05-12 LAB — CBC
HCT: 26.9 % — ABNORMAL LOW (ref 39.0–52.0)
Hemoglobin: 8.8 g/dL — ABNORMAL LOW (ref 13.0–17.0)
MCH: 30.9 pg (ref 26.0–34.0)
RBC: 2.85 MIL/uL — ABNORMAL LOW (ref 4.22–5.81)

## 2012-05-12 MED ORDER — INSULIN GLARGINE 100 UNIT/ML ~~LOC~~ SOLN: 5.0000 [IU] | Freq: Every day | SUBCUTANEOUS | Status: DC

## 2012-05-12 MED ORDER — GUAIFENESIN 100 MG/5ML PO SYRP
200.0000 mg | ORAL_SOLUTION | Freq: Four times a day (QID) | ORAL | Status: DC | PRN
  Filled 2012-05-12: qty 118

## 2012-05-12 MED ORDER — INSULIN ASPART 100 UNIT/ML ~~LOC~~ SOLN
0.0000 [IU] | Freq: Three times a day (TID) | SUBCUTANEOUS | Status: DC
  Administered 2012-05-12: 2 [IU] via SUBCUTANEOUS
  Administered 2012-05-13 (×2): 5 [IU] via SUBCUTANEOUS
  Administered 2012-05-13: 2 [IU] via SUBCUTANEOUS
  Administered 2012-05-14: 1 [IU] via SUBCUTANEOUS
  Administered 2012-05-14 (×2): 2 [IU] via SUBCUTANEOUS
  Administered 2012-05-15 (×2): 5 [IU] via SUBCUTANEOUS
  Administered 2012-05-15: 1 [IU] via SUBCUTANEOUS
  Administered 2012-05-15: 2 [IU] via SUBCUTANEOUS

## 2012-05-12 MED ORDER — INSULIN GLARGINE 100 UNIT/ML ~~LOC~~ SOLN
25.0000 [IU] | Freq: Every day | SUBCUTANEOUS | Status: DC
  Administered 2012-05-12: 25 [IU] via SUBCUTANEOUS

## 2012-05-12 NOTE — Significant Event (Signed)
Hypoglycemic Event  CBG: 46  Treatment: 15 GM carbohydrate snack and breakfast  Symptoms: None  Follow-up CBG: [id number] CBG Result 104  Possible Reasons for Event: Unknown  Comments/MD notified:Dr Swart notifed previously. New orders already received    [name], Sylvie Farrier  Remember to initiate Hypoglycemia Order Set & complete

## 2012-05-12 NOTE — Progress Notes (Signed)
Physical Therapy Note  Patient Details  Name: [name] MRN: [id number] Date of Birth: 01/18/1929 Today's Date: 05/12/2012  Time: 1345-1445 60 minutes  Pt participated in gait training group exercise with supervision-min A with RW with controlled environment and obstacle negotiation.  Nustep for strength and endurance x 10 minutes with multiple rest breaks.  Group therapy   [name] 05/12/2012, 4:29 PM

## 2012-05-12 NOTE — Significant Event (Signed)
Hypoglycemic Event  CBG: 46  Treatment: 15 GM carbohydrate snack  Symptoms: None  Follow-up CBG: Time:0756 CBG Result: 46 Possible Reasons for Event: Unknown  Comments/MD notified:Dr Riley Kill notified. New orders received    [name], [name]  Remember to initiate Hypoglycemia Order Set & complete

## 2012-05-12 NOTE — Progress Notes (Signed)
Patient ID: [name], male   DOB: 12-25-1928, 78 y.o.   MRN: [id number] Subjective/Complaints:  No new issues yesterday. cbg's still elevated in the pm but not as much A 12 point review of systems has been performed and if not noted above is otherwise negative.   Objective: Vital Signs: Blood pressure 121/71, pulse 70, temperature 98.1 F (36.7 C), temperature source Oral, resp. rate 16, height 5' 8.9" (1.75 m), weight 65.273 kg (143 lb 14.4 oz), SpO2 98.00%. No results found.  Basename 05/12/12 0500 05/11/12 0550  WBC 17.2* 17.7*  HGB 8.8* 9.2*  HCT 26.9* 27.6*  PLT 354 379   No results found for this basename: NA:2,K:2,CL:2,CO2:2,GLUCOSE:2,BUN:2,CREATININE:2,CALCIUM:2 in the last 72 hours CBG (last 3)   Basename 05/11/12 2128 05/11/12 1623 05/11/12 1120  GLUCAP 312* 361* 179*    Wt Readings from Last 3 Encounters:  05/08/12 65.273 kg (143 lb 14.4 oz)  04/26/12 65 kg (143 lb 4.8 oz)  04/26/12 65 kg (143 lb 4.8 oz)    Physical Exam:  HENT:  Head: Normocephalic.  NGT in place but appears to be pulled out 8 inches or so. Missing many teeth. Mucosa pink but with white film over tongue and pharynx. Phonation better Eyes:  Pupils round and reactive to light  Neck: Neck supple. No thyromegaly present.  Cardiovascular: Regular rhythm. Normal rate Pulmonary/Chest: Breath sounds normal. No respiratory distress. He has no wheezes. Occasional upper airway sounds heard as well as rhonchi Abdominal: Bowel sounds are normal. He exhibits no distension.  Neurological: He is alert.  Patient is a poor historian. He was able to give his date of birth and age.   He would follow simple commands. limited insight and awareness. He had difficulty controlling his secretions and a weak cough. Strength was symmetrical but he was weak more proximally than distally (2-3 prox to 4 distally). No focal sensory loss was discerned.  Skin:  Skin: 4cm lateral mallelolus wound with fibronecrotic tissue  with continued sloughing. Buttock areas with fibronecrotic tissue also (x 2) about 1-2cm in diameter which are stable Psychiatric:  Patient with improved awareness and insight   Assessment/Plan: 1. Functional deficits secondary to sepsis/deconditioning which require 3+ hours per day of interdisciplinary therapy in a comprehensive inpatient rehab setting. Physiatrist is providing close team supervision and 24 hour management of active medical problems listed below. Physiatrist and rehab team continue to assess barriers to discharge/monitor patient progress toward functional and medical goals.  Needs left BKA, transfer directly to surgical service next week?   FIM: FIM - Bathing Bathing Steps Patient Completed: Right upper leg;Left upper leg;Buttocks;Front perineal area;Right lower leg (including foot);Left lower leg (including foot) Bathing: 1: Total-Patient completes 0-2 of 10 parts or less than 25%  FIM - Upper Body Dressing/Undressing Upper body dressing/undressing steps patient completed: Thread/unthread right sleeve of pullover shirt/dresss;Thread/unthread left sleeve of pullover shirt/dress;Put head through opening of pull over shirt/dress;Pull shirt over trunk Upper body dressing/undressing: 0: Activity did not occur (Patient refused changing ) FIM - Lower Body Dressing/Undressing Lower body dressing/undressing steps patient completed: Thread/unthread right pants leg;Thread/unthread left pants leg;Pull pants up/down Lower body dressing/undressing: 0: Activity did not occur  FIM - Toileting Toileting: 0: Activity did not occur  FIM - Diplomatic Services operational officer Devices: Grab bars;Elevated toilet seat Toilet Transfers: 0-Activity did not occur  FIM - Banker Devices: Manufacturing systems engineer Transfer: 3: Bed > Chair or W/C: Mod A (lift or lower assist);3: Chair or W/C > Bed:  Mod A (lift or lower assist)  FIM - Locomotion:  Wheelchair Distance: >150' Locomotion: Wheelchair: 5: Travels 150 ft or more: maneuvers on rugs and over door sills with supervision, cueing or coaxing FIM - Locomotion: Ambulation Locomotion: Ambulation Assistive Devices: Designer, industrial/product Ambulation/Gait Assistance: 5: Supervision Locomotion: Ambulation: 2: Travels 50 - 149 ft with supervision/safety issues  Comprehension Comprehension Mode: Auditory Comprehension: 5-Follows basic conversation/direction: With no assist  Expression Expression Mode: Verbal Expression: 5-Expresses basic needs/ideas: With extra time/assistive device  Social Interaction Social Interaction: 5-Interacts appropriately 90% of the time - Needs monitoring or encouragement for participation or interaction.  Problem Solving Problem Solving: 4-Solves basic 75 - 89% of the time/requires cueing 10 - 24% of the time  Memory Memory: 4-Recognizes or recalls 75 - 89% of the time/requires cueing 10 - 24% of the time Medical Problem List and Plan:  1. deconditioning related to sepsis multiple medical complications/metabolic encephalopathy/respiratory failure. Will discuss with critical care medicine on weaning of prednisone  2. DVT Prophylaxis/Anticoagulation: Subcutaneous heparin. Monitor platelet counts and any signs of bleeding  3. Pain Management: Lortab as needed. Monitor with increased mobility  4. Neuropsych: This patient is not capable of making decisions on his/her own behalf.  5. Dysphagia. -off ivf  -phonation is better and throat is less painful  -on D1 nectars currently  -he's eating fairly well.   6.NSTEMI. Followup per cardiology services. Continue aspirin therapy as well as Lopressor 12.5 mg twice a day (hold today). No current chest pain or shortness of breath  7. Esophageal candidiasis. Diflucan  -nystatin to tongue 8. Diabetes mellitus with peripheral neuropathy. Hemoglobin A1c 10.5. Lantus insulin 15 units at bedtime. added am lantus as well to  better cover pm cbgs.  Check blood sugars a.c. and at bedtime   -given recent patter, i will increase am lantus and decrease pm lantus  -may need to put carb restrictions on diet 9. Right lateral malleolus osteomyelitis- per ortho 10. Anemia. Further drop in hgb today to 5.9. No signs of blood loss. Verify results. Probably will need a transfusion 11. Hypothyroidism. Synthroid  12. Medical noncompliance. Will discuss at length with patient and family the need to maintain medical regimen 13. Leukocytosis: wbc's continue to rise. He has been on chronic prednisone and has osteomyelitis and UTI  - Since surgical cultures may be obtained will hold on antiobiotics unless pt is febrile and more symptomatic from UTI  -Dr. Lajoyce Corners is recommending a right BKA next week. Pt is ok with this as long it "gets me home" 14. Hyponatremia: follow up labs  -on diet currently  -  LOS (Days) 16 A FACE TO FACE EVALUATION WAS PERFORMED  Shakai Dolley T 05/12/2012, 5:46 AM

## 2012-05-13 ENCOUNTER — Inpatient Hospital Stay (HOSPITAL_COMMUNITY): Payer: Medicare Other

## 2012-05-13 ENCOUNTER — Inpatient Hospital Stay (HOSPITAL_COMMUNITY): Payer: Medicare Other | Admitting: Physical Therapy

## 2012-05-13 ENCOUNTER — Inpatient Hospital Stay (HOSPITAL_COMMUNITY): Payer: Medicare Other | Admitting: Speech Pathology

## 2012-05-13 ENCOUNTER — Inpatient Hospital Stay (HOSPITAL_COMMUNITY): Payer: Medicare Other | Admitting: Occupational Therapy

## 2012-05-13 DIAGNOSIS — Z5189 Encounter for other specified aftercare: Secondary | ICD-10-CM

## 2012-05-13 DIAGNOSIS — R5381 Other malaise: Secondary | ICD-10-CM

## 2012-05-13 DIAGNOSIS — A4189 Other specified sepsis: Secondary | ICD-10-CM

## 2012-05-13 DIAGNOSIS — B3781 Candidal esophagitis: Secondary | ICD-10-CM

## 2012-05-13 DIAGNOSIS — M86179 Other acute osteomyelitis, unspecified ankle and foot: Secondary | ICD-10-CM

## 2012-05-13 DIAGNOSIS — R1310 Dysphagia, unspecified: Secondary | ICD-10-CM

## 2012-05-13 LAB — GLUCOSE, CAPILLARY
Glucose-Capillary: 53 mg/dL — ABNORMAL LOW (ref 70–99)
Glucose-Capillary: 151 mg/dL — ABNORMAL HIGH (ref 70–99)
Glucose-Capillary: 263 mg/dL — ABNORMAL HIGH (ref 70–99)

## 2012-05-13 MED ORDER — INSULIN GLARGINE 100 UNIT/ML ~~LOC~~ SOLN
15.0000 [IU] | Freq: Every day | SUBCUTANEOUS | Status: DC
  Administered 2012-05-13: 15 [IU] via SUBCUTANEOUS

## 2012-05-13 NOTE — Progress Notes (Signed)
Inpatient Diabetes Program Recommendations  AACE/ADA: New Consensus Statement on Inpatient Glycemic Control (2013)  Target Ranges:  Prepandial:   less than 140 mg/dL      Peak postprandial:   less than 180 mg/dL (1-2 hours)      Critically ill patients:  140 - 180 mg/dL   Reason for Visit: Results for ELMOR, KOST (MRN [id number]) as of 05/13/2012 10:53  Ref. Range 05/12/2012 13:13 05/12/2012 16:21 05/12/2012 21:07 05/13/2012 07:19 05/13/2012 08:23  Glucose-Capillary Latest Range: 70-99 mg/dL 89 409 (H) 811 (H) 40 (LL) 110 (H)   Note hypoglycemia in the am.  CBG this morning low.  Please decrease Lantus to 15 units daily.

## 2012-05-13 NOTE — Progress Notes (Signed)
Occupational Therapy Session Note  Patient Details  Name: [name] MRN: [id number] Date of Birth: August 11, 1929  Today's Date: 05/13/2012 Time: 0800-0855 Time Calculation (min): 55 min  Short Term Goals: Week 3: STG=LTG  Skilled Therapeutic Interventions/Progress Updates:    Pt in bed and initially stated he didn't want to get out of bed because he was warm under his blanket.  Pt agreed to to participate in bathing and dressing tasks at w/c level at sink.  Pt performed sitting EOB and amb with RW to sink with supervision.  Pt required multiple rest breaks throughout session but was able to thread pants and pull up pants.  Pt continues to require 2L O2 and O2 sats dropped to 85% when removed to don shirt and sweatshirt.  Pt stated he wanted to remain in w/c after bathing and dressing.  Focus on activity tolerance, standing balance, and safety awareness.  Therapy Documentation Precautions:  Precautions Precautions: Fall Precaution Comments: NG tube, multiple decubitus lcers to sacrum and lower legs Required Braces or Orthoses: Other Brace/Splint Other Brace/Splint: pressure relieving heel protectors when in bed Restrictions Weight Bearing Restrictions: No    Pain: Pain Assessment Pain Assessment: No/denies pain  See FIM for current functional status  Therapy/Group: Individual Therapy  [name] 05/13/2012, 8:57 AM

## 2012-05-13 NOTE — Progress Notes (Signed)
Patient ID: [name], male   DOB: [birth year], 78 y.o.   MRN: [id number] Subjective/Complaints:  Resting fairly well. No complaints today A 12 point review of systems has been performed and if not noted above is otherwise negative.   Objective: Vital Signs: Blood pressure 121/66, pulse 76, temperature 98.9 F (37.2 C), temperature source Oral, resp. rate 20, height 5' 8.9" (1.75 m), weight 65.273 kg (143 lb 14.4 oz), SpO2 97.00%. No results found.  Basename 05/12/12 0500 05/11/12 0550  WBC 17.2* 17.7*  HGB 8.8* 9.2*  HCT 26.9* 27.6*  PLT 354 379   No results found for this basename: NA:2,K:2,CL:2,CO2:2,GLUCOSE:2,BUN:2,CREATININE:2,CALCIUM:2 in the last 72 hours CBG (last 3)   Basename 05/13/12 0823 05/13/12 0719 05/12/12 2107  GLUCAP 110* 40* 106*    Wt Readings from Last 3 Encounters:  05/08/12 65.273 kg (143 lb 14.4 oz)  04/26/12 65 kg (143 lb 4.8 oz)  04/26/12 65 kg (143 lb 4.8 oz)    Physical Exam:  HENT:  Head: Normocephalic.  NGT in place but appears to be pulled out 8 inches or so. Missing many teeth. Mucosa pink but with white film over tongue and pharynx. Phonation better Eyes:  Pupils round and reactive to light  Neck: Neck supple. No thyromegaly present.  Cardiovascular: Regular rhythm. Normal rate Pulmonary/Chest: Breath sounds normal. No respiratory distress. He has no wheezes. Occasional upper airway sounds heard as well as rhonchi Abdominal: Bowel sounds are normal. He exhibits no distension.  Neurological: He is alert.  Patient is a poor historian. He was able to give his date of birth and age.   He would follow simple commands. limited insight and awareness. He had difficulty controlling his secretions and a weak cough. Strength was symmetrical but he was weak more proximally than distally (2-3 prox to 4 distally). No focal sensory loss was discerned.  Skin:  Skin: 4cm lateral mallelolus wound with fibronecrotic tissue with continued sloughing. Buttock  areas with fibronecrotic tissue also (x 2) about 1-2cm in diameter which are stable Psychiatric:  Patient with improved awareness and insight   Assessment/Plan: 1. Functional deficits secondary to sepsis/deconditioning which require 3+ hours per day of interdisciplinary therapy in a comprehensive inpatient rehab setting. Physiatrist is providing close team supervision and 24 hour management of active medical problems listed below. Physiatrist and rehab team continue to assess barriers to discharge/monitor patient progress toward functional and medical goals.  Needs left BKA, transfer directly to surgical service this week. It makes no sense to send him home first.  FIM: FIM - Bathing Bathing Steps Patient Completed: Right upper leg;Left upper leg;Buttocks;Front perineal area;Right lower leg (including foot);Left lower leg (including foot) Bathing: 1: Total-Patient completes 0-2 of 10 parts or less than 25%  FIM - Upper Body Dressing/Undressing Upper body dressing/undressing steps patient completed: Thread/unthread right sleeve of pullover shirt/dresss;Thread/unthread left sleeve of pullover shirt/dress;Put head through opening of pull over shirt/dress;Pull shirt over trunk Upper body dressing/undressing: 0: Activity did not occur (Patient refused changing ) FIM - Lower Body Dressing/Undressing Lower body dressing/undressing steps patient completed: Thread/unthread right pants leg;Thread/unthread left pants leg;Pull pants up/down Lower body dressing/undressing: 0: Activity did not occur  FIM - Toileting Toileting: 0: Activity did not occur  FIM - Diplomatic Services operational officer Devices: Grab bars;Elevated toilet seat Toilet Transfers: 0-Activity did not occur  FIM - Banker Devices: Manufacturing systems engineer Transfer: 4: Bed > Chair or W/C: Min A (steadying Pt. > 75%);4: Chair or W/C > Bed:  Min A (steadying Pt. > 75%)  FIM - Locomotion:  Wheelchair Distance: >150' Locomotion: Wheelchair: 5: Travels 150 ft or more: maneuvers on rugs and over door sills with supervision, cueing or coaxing FIM - Locomotion: Ambulation Locomotion: Ambulation Assistive Devices: Designer, industrial/product Ambulation/Gait Assistance: 5: Supervision Locomotion: Ambulation: 2: Travels 50 - 149 ft with supervision/safety issues  Comprehension Comprehension Mode: Auditory Comprehension: 5-Follows basic conversation/direction: With no assist  Expression Expression Mode: Verbal Expression: 5-Expresses basic needs/ideas: With no assist  Social Interaction Social Interaction: 5-Interacts appropriately 90% of the time - Needs monitoring or encouragement for participation or interaction.  Problem Solving Problem Solving: 4-Solves basic 75 - 89% of the time/requires cueing 10 - 24% of the time  Memory Memory: 4-Recognizes or recalls 75 - 89% of the time/requires cueing 10 - 24% of the time Medical Problem List and Plan:  1. deconditioning related to sepsis multiple medical complications/metabolic encephalopathy/respiratory failure. Will discuss with critical care medicine on weaning of prednisone  2. DVT Prophylaxis/Anticoagulation: Subcutaneous heparin. Monitor platelet counts and any signs of bleeding  3. Pain Management: Lortab as needed. Monitor with increased mobility  4. Neuropsych: This patient is not capable of making decisions on his/her own behalf.  5. Dysphagia. -off ivf  -phonation is better and throat is less painful as a whole  -on D1 nectars currently  -he's eating fairly well.   6.NSTEMI. Followup per cardiology services. Continue aspirin therapy as well as Lopressor 12.5 mg twice a day (hold today). No current chest pain or shortness of breath  7. Esophageal candidiasis. Diflucan  -nystatin to tongue 8. Diabetes mellitus with peripheral neuropathy. Hemoglobin A1c 10.5. Lantus insulin 15 units at bedtime. added am lantus as well to better  cover pm cbgs.  Check blood sugars a.c. and at bedtime   -given recent patter, i will increase am lantus and decrease pm lantus  -may need to put carb restrictions on diet 9. Right lateral malleolus osteomyelitis- per ortho 10. Anemia. Further drop in hgb today to 5.9. No signs of blood loss. Verify results. Probably will need a transfusion 11. Hypothyroidism. Synthroid  12. Medical noncompliance. Will discuss at length with patient and family the need to maintain medical regimen 13. Leukocytosis: wbc's continue to rise. He has been on chronic prednisone and has osteomyelitis and UTI  - Since surgical cultures may be obtained will hold on antiobiotics unless pt is febrile and more symptomatic from UTI  -Dr. Lajoyce Corners is recommending a right BKA next week. Pt is ok with this as long it "gets me home" 14. Hyponatremia: follow up labs  -on diet currently  -  LOS (Days) 17 A FACE TO FACE EVALUATION WAS PERFORMED  SWARTZ,ZACHARY T 05/13/2012, 8:31 AM

## 2012-05-13 NOTE — Progress Notes (Signed)
Speech Language Pathology Daily Session Note  Patient Details  Name: Aster Eckrich MRN: [id number] Date of Birth: 03-Jan-1929  Today's Date: 05/13/2012 Time: 1145-1200 Time Calculation (min): 15 min  Short Term Goals: Week 2: SLP Short Term Goal 1 (Week 2): Pt will participate in objective swallow study to asess dysphagia  SLP Short Term Goal 1 - Progress (Week 2): Met SLP Short Term Goal 2 (Week 2): Pt will utilize swallowing compensatory strategies with Min A verbal cues. SLP Short Term Goal 3 (Week 2): Pt will recall swallowing compensatory strategies with Min A semantic cues.   Skilled Therapeutic Interventions: Co-treatment with OT; SLP facilitated session with supervision level verbal cues to utilize safe swallow compensatory strategies while consuming Dys.1 textures and nectar-thick liquids via cup in a distracting group environment. Patient demonstrated an increase in throat clears at end of meal and SLP suspects it's related to reflux and with increased wait time utilized extra swallows after completing throat clears.   Pain Pain Assessment Pain Assessment: No/denies pain Pain Score: 0-No pain  Therapy/Group: Group Therapy  [name]., CCC-SLP [phone]  Marymargaret Kirker 05/13/2012, 1:20 PM

## 2012-05-13 NOTE — Progress Notes (Signed)
Occupational Therapy Session Note  Patient Details  Name: Ebubechukwu Jedlicka MRN: [id number] Date of Birth: [birth year]  Today's Date: 05/13/2012 Time: 1130-1145 Time Calculation (min): 15 min   Skilled Therapeutic Interventions/Progress Updates: Patient participated in [employer] today to address his ability to feed himself and safely manage pureed diet.  Patient with frequent cough today. Patient has O2 at 3l via nasal cannula today.     Therapy Documentation Precautions:  Precautions Precautions: Fall Precaution Comments: NG tube, multiple decubitus lcers to sacrum and lower legs Required Braces or Orthoses: Other Brace/Splint Other Brace/Splint: pressure relieving heel protectors when in bed Restrictions Weight Bearing Restrictions: No Pain: Pain Assessment Pain Assessment: No/denies pain Pain Score: 0-No pain :    See FIM for current functional status  Therapy/Group: Group Therapy  [name] 05/13/2012, 1:18 PM

## 2012-05-13 NOTE — Progress Notes (Signed)
Social Work Patient ID: [name], male   DOB: [birth year], 78 y.o.   MRN: [id number] Insurance update sent to [employer].  Received call that any surgery needs to be precertfied and fax number to send it to. Aware possible surgery Tues or Wed awaiting Dr. Lajoyce Corners input.  Will send information once know what the plan is.  Pt is aware of all of this.

## 2012-05-13 NOTE — Progress Notes (Signed)
Patient with decreased PO intake. Dressings CD&I. Independent with bed mobility. Decreased initiation. Denies pain.[name]

## 2012-05-13 NOTE — Progress Notes (Signed)
Speech Language Pathology Daily Session Note  Patient Details  Name: [name] MRN: [id number] Date of Birth: 07-Nov-1928  Today's Date: 05/13/2012 Time: 1430-1500 Time Calculation (min): 30 min  Short Term Goals: Week 2: SLP Short Term Goal 1 (Week 2): Pt will participate in objective swallow study to asess dysphagia  SLP Short Term Goal 1 - Progress (Week 2): Met SLP Short Term Goal 2 (Week 2): Pt will utilize swallowing compensatory strategies with Min A verbal cues. SLP Short Term Goal 3 (Week 2): Pt will recall swallowing compensatory strategies with Min A semantic cues.   Skilled Therapeutic Interventions: Pt asleep in bed and required Max verbal and tactile cues to arouse and sit EOB. Treatment focus on trails of Dys. 2 textures. Pt consumed trials and demonstrated efficient mastication, however, pt also demonstrated overt s/s of aspiration with throat clearing with 50% of trials. Recommend to continue with current diet and continued trials of Dys. 2 textures.    FIM:  Comprehension Comprehension Mode: Auditory Comprehension: 5-Follows basic conversation/direction: With extra time/assistive device Expression Expression Mode: Verbal Expression: 5-Expresses basic needs/ideas: With extra time/assistive device Social Interaction Social Interaction: 5-Interacts appropriately 90% of the time - Needs monitoring or encouragement for participation or interaction. Problem Solving Problem Solving: 5-Solves basic 90% of the time/requires cueing < 10% of the time Memory Memory: 5-Recognizes or recalls 90% of the time/requires cueing < 10% of the time FIM - Eating Eating Activity: 5: Supervision/cues  Pain Pain Assessment Pain Assessment: No/denies pain  Therapy/Group: Individual Therapy  Leyna Vanderkolk 05/13/2012, 4:36 PM

## 2012-05-13 NOTE — Progress Notes (Signed)
Physical Therapy Session Note  Patient Details  Name: [name] MRN: [id number] Date of Birth: [birth year]  Today's Date: 05/13/2012 Time: 1001-1058 Time Calculation (min): 57 min  Short Term Goals: Week 3 = long term goals  Skilled Therapeutic Interventions/Progress Updates:    Gait training x 50', 41' with RW close supervision, verbal cues for improved posture and safety with RW management (tends to get RW too far anterior). Stair training, first two steps performed with min assist, pt then required rest. Second two steps required moderate assist and assist with lifting LE to clear step. Sit <> stands with basketball shooting in standing without UE support to promote strength, balance, and activity tolerance. Extended and frequent rest needed throughout session. Pt requires assist to lift bil. LEs back into bed secondary to weakness. SpO2 >93% throughout session on 2L [place] O2 however pt dyspneic with exertion.   Discussed need for ramp particularly after amputation, pt verbalized understanding and reports he will discuss with his daughter.   Therapy Documentation Precautions:  Precautions Precautions: Fall Precaution Comments: NG tube, multiple decubitus lcers to sacrum and lower legs Required Braces or Orthoses: Other Brace/Splint Other Brace/Splint: pressure relieving heel protectors when in bed Restrictions Weight Bearing Restrictions: No Pain: Pain Assessment Pain Assessment: No/denies pain Locomotion : Ambulation Ambulation/Gait Assistance: 5: Supervision Wheelchair Mobility Distance: 8'   See FIM for current functional status  Therapy/Group: Individual Therapy  [name] 05/13/2012, 12:14 PM

## 2012-05-14 ENCOUNTER — Ambulatory Visit (HOSPITAL_COMMUNITY): Payer: Medicare Other

## 2012-05-14 ENCOUNTER — Inpatient Hospital Stay (HOSPITAL_COMMUNITY): Payer: Medicare Other | Admitting: Speech Pathology

## 2012-05-14 ENCOUNTER — Inpatient Hospital Stay (HOSPITAL_COMMUNITY): Payer: Medicare Other | Admitting: Physical Therapy

## 2012-05-14 ENCOUNTER — Inpatient Hospital Stay (HOSPITAL_COMMUNITY): Payer: Medicare Other | Admitting: Occupational Therapy

## 2012-05-14 ENCOUNTER — Inpatient Hospital Stay (HOSPITAL_COMMUNITY): Payer: Medicare Other

## 2012-05-14 DIAGNOSIS — R5381 Other malaise: Secondary | ICD-10-CM

## 2012-05-14 DIAGNOSIS — A4189 Other specified sepsis: Secondary | ICD-10-CM

## 2012-05-14 DIAGNOSIS — B3781 Candidal esophagitis: Secondary | ICD-10-CM

## 2012-05-14 DIAGNOSIS — Z5189 Encounter for other specified aftercare: Secondary | ICD-10-CM

## 2012-05-14 DIAGNOSIS — M86179 Other acute osteomyelitis, unspecified ankle and foot: Secondary | ICD-10-CM

## 2012-05-14 DIAGNOSIS — R1310 Dysphagia, unspecified: Secondary | ICD-10-CM

## 2012-05-14 LAB — GLUCOSE, CAPILLARY: Glucose-Capillary: 76 mg/dL (ref 70–99)

## 2012-05-14 MED ORDER — INSULIN NPH (HUMAN) (ISOPHANE) 100 UNIT/ML ~~LOC~~ SUSP
15.0000 [IU] | Freq: Every day | SUBCUTANEOUS | Status: DC
  Administered 2012-05-15: 15 [IU] via SUBCUTANEOUS
  Filled 2012-05-14: qty 10

## 2012-05-14 NOTE — Patient Care Conference (Signed)
Inpatient RehabilitationTeam Conference Note Date: 05/14/2012   Time: 2:33 PM    Patient Name: [name]      Medical Record Number: [id number]  Date of Birth: Oct 22, 1928 Sex: Male         Room/Bed: 4009/4009-01 Payor Info: Payor: MEDICARE  Plan: MEDICARE PART A AND B  Product Type: *No Product type*     Admitting Diagnosis: deconditioned  Admit Date/Time:  04/26/2012  6:17 PM Admission Comments: No comment available   Primary Diagnosis:  Sepsis Principal Problem: Sepsis  Patient Active Problem List   Diagnosis Date Noted  . Sepsis 04/29/2012  . 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 Pulmonary infiltrates 04/19/2012  . Dysphagia 04/19/2012  . Physical deconditioning 04/19/2012  . Systolic dysfunction 04/19/2012  . Hypernatremia 04/19/2012  . Non-ST elevation myocardial infarction (NSTEMI), initial episode of care 04/05/2012  . Diabetes with ulcer of foot 04/04/2012  . Sacral decubitus ulcer 04/04/2012  . Uncontrolled diabetes mellitus 11/22/2011  . Cellulitis of lower leg 11/22/2011  . Ulcer of leg due to secondary diabetes mellitus 11/22/2011  . Hypokalemia 10/21/2011  . Anemia 10/21/2011  . Weakness 10/20/2011  . Hypertension 10/20/2011  . Diabetes mellitus 10/20/2011  . Prostate cancer 10/20/2011  . Hypothyroidism 10/20/2011    Expected Discharge Date: Expected Discharge Date: 05/15/12  Team Members Present: Physician: Dr. Faith Rogue Nurse Present: Daryll Brod, RN PT Present: Karolee Stamps, PT;Chris Larina Bras, PT OT Present: Edwin Cap, OT SLP Present: Feliberto Gottron, SLP Other (Discipline and Name): Jacqlyn Larsen Coordinator     Current Status/Progress Goal Weekly Team Focus  Medical   nees right bka  see prior  push po   Bowel/Bladder   Condom cath at hs. Incontinent of bowel and bladder  Managed bowel and bladder with program  Timed toileting q 2 hrs while awake   Swallow/Nutrition/ Hydration   Dys. 1 textures and nectar thick liquids, Min A  for utilization of swallowing compensatory strategies  Min A  increased utilization of swallowing compensatory strategies    ADL's   supervision/min A overall; continued decreased activity tolerance  supervision/min A overall - goals downgraded  activity tolerance; standing balance    Mobility   Supervision/min assist for sit <> stands and ambulation. Moderate assist for stairs. Very limited activity tolerance.  Supervision  Improved endurance, standing balance, strength, wheelchair mobility, pressure relief.   Communication             Safety/Cognition/ Behavioral Observations  Supervision-Min A  Supervision  working memory    Pain   No c/o pain  <3  Monitor   Skin   Stage 2 (sacrum, right and left buttock, 2 lowe left leg ulcer partially healed, right lower leg ulcer unhealed  no new skin breakdown  Q 2 turn, keep off bottom      *See Interdisciplinary Assessment and Plan and progress notes for long and short-term goals  Barriers to Discharge: see prior    Possible Resolutions to Barriers:  see prior    Discharge Planning/Teaching Needs:  Awaiting surgery decision-transfer from Rehab to Surgery via Dr Lajoyce Corners.  Family does plan to take pt home when medically ready      Team Discussion:  Pt's endurance an issue-limited activity tolerance.  Swallow safest at dys 1 nectar thick liquids.  MD not sending home at this point.  Revisions to Treatment Plan:  Needs surgery-BKA, awaiting Dr Lajoyce Corners    Continued Need for Acute Rehabilitation Level of Care: The patient requires daily medical management  by a physician with specialized training in physical medicine and rehabilitation for the following conditions: Daily direction of a multidisciplinary physical rehabilitation program to ensure safe treatment while eliciting the highest outcome that is of practical value to the patient.: Yes Daily medical management of patient stability for increased activity during participation in an intensive  rehabilitation regime.: Yes Daily analysis of laboratory values and/or radiology reports with any subsequent need for medication adjustment of medical intervention for : Post surgical problems;Neurological problems  Lucy Chris 05/14/2012, 2:33 PM

## 2012-05-14 NOTE — Progress Notes (Signed)
Speech Language Pathology Daily Session Note  Patient Details  Name: [name] MRN: [id number] Date of Birth: [birth year]  Today's Date: 05/14/2012 Time: 1145-1200 Time Calculation (min): 15 min  Short Term Goals: Week 2: SLP Short Term Goal 1 (Week 2): Pt will participate in objective swallow study to asess dysphagia  SLP Short Term Goal 1 - Progress (Week 2): Met SLP Short Term Goal 2 (Week 2): Pt will utilize swallowing compensatory strategies with Min A verbal cues. SLP Short Term Goal 3 (Week 2): Pt will recall swallowing compensatory strategies with Min A semantic cues.   Skilled Therapeutic Interventions: Co-treatment with OT; SLP facilitated session with supervision level verbal cues to utilize safe swallow compensatory strategies while consuming Dys.1 textures and nectar-thick liquids via cup in a distracting group environment. Patient demonstrated an increase in throat clears at end of meal and SLP suspects it's related to reflux and with increased wait time utilized extra swallows after completing throat clears.   FIM:  FIM - Eating Eating Activity: 5: Needs verbal cues/supervision  Pain Pain Assessment Pain Assessment: No/denies pain  Therapy/Group: Group Therapy  [name]., CCC-SLP [phone]  [name] 05/14/2012, 1:28 PM

## 2012-05-14 NOTE — Progress Notes (Signed)
Occupational Therapy Session Note  Patient Details  Name: [name] MRN: [id number] Date of Birth: 07-Dec-1928  Today's Date: 05/14/2012 Time: 1130-1145 Time Calculation (min): 15 min   Skilled Therapeutic Interventions/Progress Updates: Patient participated in [employer] today.  Patient needs frequent reminders regarding use of swallowing strategies during meals.      Therapy Documentation Precautions:  Precautions Precautions: Fall Precaution Comments: NG tube, multiple decubitus lcers to sacrum and lower legs Required Braces or Orthoses: Other Brace/Splint Other Brace/Splint: pressure relieving heel protectors when in bed Restrictions Weight Bearing Restrictions: No  Pain: Pain Assessment Pain Assessment: No/denies pain     See FIM for current functional status  Therapy/Group: Group Therapy  [name] 05/14/2012, 12:13 PM

## 2012-05-14 NOTE — Progress Notes (Signed)
Speech Language Pathology Session & Weekly Progress Note  Patient Details  Name: [name] MRN: [id number] Date of Birth: 09-20-1928  Today's Date: 05/14/2012 Time: 0900-0930 Time Calculation (min): 30 min  Short Term Goals: Week 2: SLP Short Term Goal 1 (Week 2): Pt will participate in objective swallow study to asess dysphagia  SLP Short Term Goal 1 - Progress (Week 2): Met SLP Short Term Goal 2 (Week 2): Pt will utilize swallowing compensatory strategies with Min A verbal cues. SLP Short Term Goal 2 - Progress (Week 2): Met SLP Short Term Goal 3 (Week 2): Pt will recall swallowing compensatory strategies with Min A semantic cues.  SLP Short Term Goal 3 - Progress (Week 2): Met  New Short Term Goals:  Week 3: SLP Short Term Goal 1 (Week 3): Pt will utilize swallowing compensatory strategies with supervision verbal cues. SLP Short Term Goal 2 (Week 3): Pt's family will independently demonstrate apporpriate cueing/ recall of swallowing compensatory strategies.   Weekly Progress Updates: Pt has made functional gains and has met 3 out of 3 STG's this reporting period.  Currently, pt is tolerating a Dys. 1 diet with nectar-thick liquids with overall Min A for utilization of swallowing compensatory strategies. Pt's overall cognitive function has also improved and pt is overall supervision for short-term memory.  Recommend pt to continue current diet secondary to intermittent fatigue and weakness that impacts overall swallow function. Pt's discharge plan has changed due to need for surgery.  Recommend f/u skiiled SLP intervention at next venue of care.    SLP Frequency: 1-2 X/day, 30-60 minutes Estimated Length of Stay: TBD due to surgery  SLP Treatment/Interventions: Cognitive remediation/compensation;Cueing hierarchy;Functional tasks;Therapeutic Activities;Internal/external aids;Dysphagia/aspiration precaution training;Environmental controls;Patient/family education  Daily  Session Skilled Therapeutic Intervention: Pt reports fatigue and weakness this morning and was hesitant to get out of bed.  Pt with increased coughing/throat clearing during functional conversation with clinician (questionable reflux). Pt educated on clinical reasoning for not upgrading current diet due to overall continued fatigue and weakness, pt verbalized understanding.  FIM:  Comprehension Comprehension Mode: Auditory Comprehension: 5-Understands complex 90% of the time/Cues < 10% of the time Expression Expression Mode: Verbal Expression: 5-Expresses complex 90% of the time/cues < 10% of the time Social Interaction Social Interaction: 5-Interacts appropriately 90% of the time - Needs monitoring or encouragement for participation or interaction. Problem Solving Problem Solving: 5-Solves basic 90% of the time/requires cueing < 10% of the time Memory Memory: 5-Recognizes or recalls 90% of the time/requires cueing < 10% of the time FIM - Eating Eating Activity: 5: Supervision/cues Pain Pain Assessment Pain Assessment: No/denies pain  Therapy/Group: Individual Therapy  [name] 05/14/2012, 3:53 PM

## 2012-05-14 NOTE — Progress Notes (Signed)
Occupational Therapy Session Note  Patient Details  Name: [name] MRN: [id number] Date of Birth: 02-Jun-1929  Today's Date: 05/14/2012 Time: 0800-0845 Time Calculation (min): 45 min   Skilled Therapeutic Interventions/Progress Updates:    Pt required max encouragement to engage in bathing and dressing tasks this morning.  Pt amb with RW approx 5" from bed to w/c for ADLs at sink.  Pt stood X 4 with BUE support.  Pt attempted to pull up pants but continued to required UE support when standing.  Pt requested to return to bed at end of session.  Initially agreed to remain in w/c but then became adamant on returning to bed.  Pt performed stand pivot transfer with supervision.  Therapy Documentation Precautions:  Precautions Precautions: Fall Precaution Comments: NG tube, multiple decubitus lcers to sacrum and lower legs Required Braces or Orthoses: Other Brace/Splint Other Brace/Splint: pressure relieving heel protectors when in bed Restrictions Weight Bearing Restrictions: No General:   Vital Signs: Therapy Vitals Temp: 98.3 F (36.8 C) Temp src: Oral Pulse Rate: 80  Resp: 18  BP: 103/51 mmHg Patient Position, if appropriate: Lying Oxygen Therapy SpO2: 90 % O2 Device: None (Room air) Pain: Pain Assessment Pain Assessment: No/denies pain ADL: ADL Eating: NPO Grooming: Supervision/safety Where Assessed-Grooming: Edge of bed Upper Body Bathing: Supervision/safety Where Assessed-Upper Body Bathing: Edge of bed Lower Body Bathing: Moderate assistance Where Assessed-Lower Body Bathing: Edge of bed Upper Body Dressing: Minimal assistance Where Assessed-Upper Body Dressing: Edge of bed Lower Body Dressing: Dependent Where Assessed-Lower Body Dressing: Edge of bed Toileting: Dependent Where Assessed-Toileting: Teacher, adult education: Moderate assistance Toilet Transfer Method: Ambulating (with use of RW) Tub/Shower Transfer: Not assessed Film/video editor: Not  assessed ADL Comments: Pt with limited endurance and decreased flexibility to reach his feet for dressing tasks.  Mod assist needed for sit to stand with mod instructional cueing for hand placement during sit to stand. Exercises:   Other Treatments:    See FIM for current functional status  Therapy/Group: Individual Therapy  [name] 05/14/2012, 8:48 AM

## 2012-05-14 NOTE — Progress Notes (Addendum)
Physical Therapy Session Note  Patient Details  Name: [name] MRN: [id number] Date of Birth: 04-07-1929  Today's Date: 05/14/2012 Time: 4098-1191 Time Calculation (min): 25 min  Short Term Goals: Week 3 = LTGs  Skilled Therapeutic Interventions/Progress Updates:    Pt very fatigued at start of treatment, refused to ambulate or perform standing activity. Pt transferred to bed with RW, supervision for cues for wheelchair set up and safety as well as safety with RW. Pt required assist to lift bil. LEs into bed, encouraged for increased independence with bed mobility. Exercises in bed working hip abduction, pt continues to require extended rest during session.   Goals downgraded due to slow progress  Therapy Documentation Precautions:  Precautions Precautions: Fall Precaution Comments: NG tube, multiple decubitus lcers to sacrum and lower legs Required Braces or Orthoses: Other Brace/Splint Other Brace/Splint: pressure relieving heel protectors when in bed Restrictions Weight Bearing Restrictions: No General: Amount of Missed PT Time (min): 60 Minutes Missed Time Reason: Patient fatigue Pain: Pain Assessment Pain Assessment: No/denies pain  See FIM for current functional status  Therapy/Group: Individual Therapy  [name] 05/14/2012, 2:11 PM

## 2012-05-14 NOTE — Progress Notes (Signed)
Patient ID: [name], male   DOB: [birth year], 78 y.o.   MRN: [id number] Subjective/Complaints:  Resting fairly well. Sugar quite low today. A 12 point review of systems has been performed and if not noted above is otherwise negative.   Objective: Vital Signs: Blood pressure 103/51, pulse 80, temperature 98.3 F (36.8 C), temperature source Oral, resp. rate 18, height 5' 8.9" (1.75 m), weight 65.273 kg (143 lb 14.4 oz), SpO2 90.00%. No results found.  Basename 05/12/12 0500  WBC 17.2*  HGB 8.8*  HCT 26.9*  PLT 354   No results found for this basename: NA:2,K:2,CL:2,CO2:2,GLUCOSE:2,BUN:2,CREATININE:2,CALCIUM:2 in the last 72 hours CBG (last 3)   Basename 05/14/12 0729 05/13/12 2050 05/13/12 1720  GLUCAP 38* 262* 263*    Wt Readings from Last 3 Encounters:  05/08/12 65.273 kg (143 lb 14.4 oz)  04/26/12 65 kg (143 lb 4.8 oz)  04/26/12 65 kg (143 lb 4.8 oz)    Physical Exam:  HENT:  Head: Normocephalic.  NGT in place but appears to be pulled out 8 inches or so. Missing many teeth. Mucosa pink but with white film over tongue and pharynx. Phonation better Eyes:  Pupils round and reactive to light  Neck: Neck supple. No thyromegaly present.  Cardiovascular: Regular rhythm. Normal rate Pulmonary/Chest: Breath sounds normal. No respiratory distress. He has no wheezes. Occasional upper airway sounds heard as well as rhonchi Abdominal: Bowel sounds are normal. He exhibits no distension.  Neurological: He is alert.  Patient is a poor historian. He was able to give his date of birth and age.   He would follow simple commands. limited insight and awareness. He had difficulty controlling his secretions and a weak cough. Strength was symmetrical but he was weak more proximally than distally (2-3 prox to 4 distally). No focal sensory loss was discerned.  Skin:  Skin: 4cm lateral mallelolus wound with fibronecrotic tissue with continued sloughing. Buttock areas with fibronecrotic tissue  also (x 2) about 1-2cm in diameter which are stable Psychiatric:  Patient with improved awareness and insight   Assessment/Plan: 1. Functional deficits secondary to sepsis/deconditioning which require 3+ hours per day of interdisciplinary therapy in a comprehensive inpatient rehab setting. Physiatrist is providing close team supervision and 24 hour management of active medical problems listed below. Physiatrist and rehab team continue to assess barriers to discharge/monitor patient progress toward functional and medical goals.  Needs left BKA, transfer directly to surgical service this week. It makes no sense to send him home first.  FIM: FIM - Bathing Bathing Steps Patient Completed: Chest;Right Arm;Left Arm;Abdomen;Front perineal area;Buttocks;Right upper leg;Left upper leg Bathing: 4: Min-Patient completes 8-9 55f 10 parts or 75+ percent  FIM - Upper Body Dressing/Undressing Upper body dressing/undressing steps patient completed: Thread/unthread right sleeve of pullover shirt/dresss;Thread/unthread left sleeve of pullover shirt/dress;Put head through opening of pull over shirt/dress;Pull shirt over trunk Upper body dressing/undressing: 5: Supervision: Safety issues/verbal cues FIM - Lower Body Dressing/Undressing Lower body dressing/undressing steps patient completed: Thread/unthread right underwear leg;Pull underwear up/down;Thread/unthread right pants leg;Pull pants up/down;Don/Doff right sock;Don/Doff left sock Lower body dressing/undressing: 4: Min-Patient completed 75 plus % of tasks  FIM - Toileting Toileting: 0: Activity did not occur  FIM - Diplomatic Services operational officer Devices: Grab bars;Elevated toilet seat Toilet Transfers: 0-Activity did not occur  FIM - Banker Devices: Therapist, occupational: 5: Supine > Sit: Supervision (verbal cues/safety issues);5: Bed > Chair or W/C: Supervision (verbal cues/safety  issues)  FIM - Locomotion: Wheelchair Distance:  75' Locomotion: Wheelchair: 2: Travels 50 - 149 ft with supervision, cueing or coaxing FIM - Locomotion: Ambulation Locomotion: Ambulation Assistive Devices: Designer, industrial/product Ambulation/Gait Assistance: 5: Supervision Locomotion: Ambulation: 2: Travels 50 - 149 ft with supervision/safety issues  Comprehension Comprehension Mode: Auditory Comprehension: 5-Follows basic conversation/direction: With extra time/assistive device  Expression Expression Mode: Verbal Expression: 5-Expresses basic needs/ideas: With extra time/assistive device  Social Interaction Social Interaction: 5-Interacts appropriately 90% of the time - Needs monitoring or encouragement for participation or interaction.  Problem Solving Problem Solving: 5-Solves basic 90% of the time/requires cueing < 10% of the time  Memory Memory: 5-Recognizes or recalls 90% of the time/requires cueing < 10% of the time Medical Problem List and Plan:  1. deconditioning related to sepsis multiple medical complications/metabolic encephalopathy/respiratory failure. Will discuss with critical care medicine on weaning of prednisone  2. DVT Prophylaxis/Anticoagulation: Subcutaneous heparin. Monitor platelet counts and any signs of bleeding  3. Pain Management: Lortab as needed. Monitor with increased mobility  4. Neuropsych: This patient is not capable of making decisions on his/her own behalf.  5. Dysphagia. -off ivf  -phonation is better and throat is less painful as a whole  -on D1 nectars currently  -he's eating fairly well.   6.NSTEMI. Followup per cardiology services. Continue aspirin therapy as well as Lopressor 12.5 mg twice a day (hold today). No current chest pain or shortness of breath  7. Esophageal candidiasis. Diflucan  -nystatin to tongue 8. Diabetes mellitus with peripheral neuropathy. Hemoglobin A1c 10.5. Lantus insulin 15 units at bedtime. added am lantus as well to  better cover pm cbgs.  Check blood sugars a.c. and at bedtime   -despite dc of hs lantus, am sugars continue to be extremely low.  -will change to nph insulin in the am pending my ability to "order" the med on epic. 9. Right lateral malleolus osteomyelitis- per ortho 10. Anemia. Further drop in hgb today to 5.9. No signs of blood loss. Verify results. Probably will need a transfusion 11. Hypothyroidism. Synthroid  12. Medical noncompliance. Will discuss at length with patient and family the need to maintain medical regimen 13. Leukocytosis: wbc's continue to rise. He has been on chronic prednisone and has osteomyelitis and UTI  - Since surgical cultures may be obtained will hold on antiobiotics unless pt is febrile and more symptomatic from UTI  -Dr. Lajoyce Corners is recommending a right BKA next week. Pt is ok with this as long it "gets me home" 14. Hyponatremia: follow up labs  -on diet currently  -  LOS (Days) 18 A FACE TO FACE EVALUATION WAS PERFORMED  SWARTZ,ZACHARY T 05/14/2012, 7:57 AM

## 2012-05-14 NOTE — Progress Notes (Signed)
Physical Therapy Note  Patient Details  Name: [name] MRN: [id number] Date of Birth: 1928/10/08 Today's Date: 05/14/2012  Patient missed 60 minutes of walking group therapy. Patient reports being fatigued and wanting to stay in bed to rest.   Alma Friendly 05/14/2012, 10:32 AM

## 2012-05-14 NOTE — Progress Notes (Signed)
Nutrition Follow-up  Intervention:   1. Continue current interventions 2. RD to continue to follow  Assessment:   Continues on Dysphagia 1 diet with nectar thickened liquids. Pt with variable intake, consuming mostly 25 - 100% of meals. States that he feels as though he is eating well.   Diet Order:  Dysphagia 1 with nectar thickened liquids Supplements: Ensure Pudding PO TID  Meds: Scheduled Meds:    . aspirin  81 mg Oral Daily  . collagenase   Topical Daily  . famotidine  20 mg Oral Daily  . feeding supplement  1 Container Oral TID BM  . ferrous sulfate  325 mg Oral TID WC  . heparin subcutaneous  5,000 Units Subcutaneous Q8H  . insulin aspart  0-9 Units Subcutaneous TID WC & HS  . insulin NPH  15 Units Subcutaneous QAC breakfast  . levothyroxine  75 mcg Oral QAC breakfast  . metoprolol tartrate  12.5 mg Oral BID  . nystatin  5 mL Mouth/Throat QID  . DISCONTD: insulin glargine  15 Units Subcutaneous Daily  . DISCONTD: insulin glargine  25 Units Subcutaneous Daily   Continuous Infusions:   PRN Meds:.albuterol, antiseptic oral rinse, food thickener, guaifenesin, HYDROcodone-acetaminophen, ipratropium, ondansetron (ZOFRAN) IV, ondansetron, sodium chloride, sorbitol  Labs:  CMP     Component Value Date/Time   NA 131* 05/09/2012 0545   K 4.1 05/09/2012 0545   CL 95* 05/09/2012 0545   CO2 30 05/09/2012 0545   GLUCOSE 223* 05/09/2012 0545   BUN 22 05/09/2012 0545   CREATININE 0.64 05/09/2012 0545   CALCIUM 8.1* 05/09/2012 0545   PROT 5.8* 05/03/2012 0655   ALBUMIN 1.4* 05/03/2012 0655   AST 18 05/03/2012 0655   ALT 18 05/03/2012 0655   ALKPHOS 123* 05/03/2012 0655   BILITOT 0.1* 05/03/2012 0655   GFRNONAA 89* 05/09/2012 0545   GFRAA >90 05/09/2012 0545   CBG (last 3)   Basename 05/14/12 1116 05/14/12 0915 05/14/12 0820  GLUCAP 160* 150* 76   Lab Results  Component Value Date   HGBA1C 10.5* 04/06/2012    Intake/Output Summary (Last 24 hours) at 05/14/12 1348 Last data filed at 05/14/12  1300  Gross per 24 hour  Intake    960 ml  Output    400 ml  Net    560 ml  BM 9/3  Weight Status:  65.3 kg - stable  Estimated needs:  1700 - 1800 kcal, 90 - 115 grams protein  Nutrition Dx:  Inadequate oral intake r/t inability to eat AEB NPO status. Resolved.  New Nutrition Dx: Swallowing difficulty r/t dysphagia AEB need for ST and thickened liquids. Ongoing.  Goal:  Pt to meet >/= 90% of their estimated nutrition needs - improving  Monitor:  weights, labs, supplement tolerance, I/O's, diet advancement   Jarold Motto MS, RD, LDN Pager: [phone] After-hours pager: [phone]

## 2012-05-15 ENCOUNTER — Inpatient Hospital Stay (HOSPITAL_COMMUNITY): Payer: Medicare Other | Admitting: Occupational Therapy

## 2012-05-15 ENCOUNTER — Inpatient Hospital Stay (HOSPITAL_COMMUNITY): Payer: Medicare Other | Admitting: Physical Therapy

## 2012-05-15 ENCOUNTER — Inpatient Hospital Stay (HOSPITAL_COMMUNITY): Payer: Medicare Other

## 2012-05-15 DIAGNOSIS — B3781 Candidal esophagitis: Secondary | ICD-10-CM

## 2012-05-15 DIAGNOSIS — Z5189 Encounter for other specified aftercare: Secondary | ICD-10-CM

## 2012-05-15 DIAGNOSIS — R1310 Dysphagia, unspecified: Secondary | ICD-10-CM

## 2012-05-15 DIAGNOSIS — A4189 Other specified sepsis: Secondary | ICD-10-CM

## 2012-05-15 DIAGNOSIS — M86179 Other acute osteomyelitis, unspecified ankle and foot: Secondary | ICD-10-CM

## 2012-05-15 DIAGNOSIS — R5381 Other malaise: Secondary | ICD-10-CM

## 2012-05-15 LAB — GLUCOSE, CAPILLARY
Glucose-Capillary: 300 mg/dL — ABNORMAL HIGH (ref 70–99)
Glucose-Capillary: 243 mg/dL — ABNORMAL HIGH (ref 70–99)

## 2012-05-15 MED ORDER — CHLORHEXIDINE GLUCONATE 4 % EX LIQD
60.0000 mL | Freq: Once | CUTANEOUS | Status: DC
  Filled 2012-05-15: qty 60

## 2012-05-15 MED ORDER — CEFAZOLIN SODIUM-DEXTROSE 2-3 GM-% IV SOLR
2.0000 g | INTRAVENOUS | Status: DC
  Filled 2012-05-15: qty 50

## 2012-05-15 MED ORDER — INSULIN NPH (HUMAN) (ISOPHANE) 100 UNIT/ML ~~LOC~~ SUSP
10.0000 [IU] | Freq: Every day | SUBCUTANEOUS | Status: DC
  Administered 2012-05-15: 10 [IU] via SUBCUTANEOUS
  Filled 2012-05-15: qty 10

## 2012-05-15 MED ORDER — MIRTAZAPINE 7.5 MG PO TABS
7.5000 mg | ORAL_TABLET | Freq: Every day | ORAL | Status: DC
  Administered 2012-05-15: 7.5 mg via ORAL
  Filled 2012-05-15 (×2): qty 1

## 2012-05-15 NOTE — Discharge Summary (Signed)
NAME:  [name], [name] NO.:  [id number]  MEDICAL RECORD NO.:  [id number]  LOCATION:  4009                         FACILITY:  [facility]  PHYSICIAN:  Ranelle Oyster, M.D.DATE OF BIRTH:  October 18, 1928  DATE OF ADMISSION:  04/26/2012 DATE OF DISCHARGE:                              DISCHARGE SUMMARY   Admit date April 26, 2012.  Discharge date May 16, 2012, to the services of Dr. Lajoyce Corners.  DISCHARGE DIAGNOSES: 1. Deconditioning related to sepsis. 2. Subcutaneous heparin for deep vein thrombosis prophylaxis, pain     management, dysphagia, non-ST segment elevation myocardial     infarction, esophageal candidiasis, diabetes mellitus, right     lateral malleolus, osteomyelitis, anemia, hypothyroidism, history     of medical noncompliance, hyponatremia, depression. 3. Multiple wounds questionable pressure related to buttocks and ischium. This is an 78 year old right-handed male multi medical peripheral neuropathy secondary to diabetes mellitus, coronary artery disease, congestive heart failure and poor medical compliance was admitted April 04, 2012, with nausea, vomiting, altered mental status.  Found to be hyperglycemic as well as acidotic.  Cranial CT scan with chronic changes without acute abnormality.  The patient is hypotensive secondary to dehydration, placed on intravenous fluids.  Noted marginally elevated cardiac enzymes.  The patient was intubated, followed by Critical Care. Follow up Cardiology Services.  EKG showing normal sinus rhythm, right bundle branch block and left anterior fascicular block consistent with type 2 NSTEMI, felt to be secondary to demand ischemia from his sepsis and hypertension.  Echocardiogram with ejection fraction 45%.  Diffuse hypokinesis and grade 1 diastolic dysfunction.  The patient was slowly extubated.  Placed on subcutaneous heparin for DVT prophylaxis.  Wound care nurse consulted for multiple wounds, most significant right  lateral malleolus and left lower buttocks with skin care recommendations.  Noted ongoing bouts of altered mental status secondary to acute encephalopathy from his sepsis.  A nasogastric tube was in place.  Noted dysphagia. EGD completed by Gastroenterology showing esophageal candidiasis and atrophic gastritis, placed on Diflucan.  There was questionable need for gastrostomy tube.  The patient was admitted for comprehensive rehab program.  PAST MEDICAL HISTORY:  See discharge diagnoses.  SOCIAL HISTORY:  Lives with family.  FUNCTIONAL HISTORY:  Prior to admission, he was able to navigate stairs. Functional status upon admission to rehab services was +2 total assist for stand pivot transfers, +2 total assist ambulate 15 feet with a rolling walker.  PHYSICAL EXAMINATION:  VITAL SIGNS:  Blood pressure 110/58, pulse 96, temperature 99, respirations 16. GENERAL:  This was an alert male.  He was a poor historian.  He could name the hospital with multiple cues.  Followed simple commands.  Poor insight and awareness of his deficits. LUNGS:  Decreased breath sounds. CARDIAC:  Rate controlled. ABDOMEN:  Soft, nontender.  Good bowel sounds.  He had a nasogastric tube in place.  Noted right lateral malleolus dressing in place for wound.  REHABILITATION HOSPITAL COURSE:  The patient was admitted to inpatient rehab services with therapies initiated on a 3-hour daily basis consisting of physical therapy, occupational therapy, speech therapy, and rehabilitation nursing.  The following issues were addressed during the patient's rehabilitation stay.  Pertaining  to [name], deconditioning secondary to sepsis multi medical with metabolic encephalopathy, remained stable.  His gains on physical rehab medicine service remained slow and limited.  He was slowly weaned from his prednisone therapy.  He was maintained on subcutaneous heparin for DVT prophylaxis.  He was followed intensively by  speech therapy for dysphagia.  He completed a course of Diflucan for candidiasis.  His diet was slowly advanced to a dysphagia 1 diet and monitoring of nutritional support.  There was some initial concerns he may need a gastrostomy tube.  Followed by Cardiology Services for NSTEMI, maintained on aspirin therapy.  Blood pressures monitored on metoprolol.  His blood sugars had some variations while on prednisone therapy with insulin as directed, latest hemoglobin A1c of 10.5 and noted history of medical noncompliance.  Placed on Remeron for hospital course depression multi medical with emotional support provided.  Noted right lateral malleolus wound that was followed closely with skin care as well as wound care nurse.  MRI imaging of right ankle May 03, 2012, showed lateral malleolar ulcer and osteomyelitis of the tip of the lateral malleolus. Due to these findings, Orthopedic Service with Dr. Lajoyce Corners was consulted, felt that best option would be for right transtibial amputation to eradicate this osteomyelitis. Also noted with suspect pressure ulcers skin wounds to buttocks and if she MI which wound care nurse was aware of and had been seen patient also at [facility]. He currently remained afebrile there was questionable possible need for I&D per wound care nurse and this would need to be monitored after addressing amputation per Dr. Lajoyce Corners.  All issues in regards to this were discussed with the patient and family.  Current plan was to be discharged from rehab services on May 16, 2012, to the services of Dr. Lajoyce Corners for stated surgical procedure.  All medication changes would be made at the discretion of Orthopedic Services.  During the patient's rehabilitation stay, he received weekly collaborative interdisciplinary team conferences.  He was supervision minimal assist overall for activities of daily living, decreased activity tolerance, supervision minimal assist for sit to stand and  ambulating short distances.     Mariam Dollar, P.A.   ______________________________ Ranelle Oyster, M.D.    DA/MEDQ  D:  05/15/2012  T:  05/15/2012  Job:  [id number]

## 2012-05-15 NOTE — Progress Notes (Signed)
Patient ID: [name], male   DOB: [birth year], 78 y.o.   MRN: [id number] [address] grade 3 ulcer right ankle lateral malleolus with osteomyelitis of the fibula. Patient has failed conservative wound care and will plan for a transtibial amputation tomorrow afternoon. We will plan for admitting patient to the orthopedic service inpatient after surgery and will plan for discharge to short-term skilled nursing.

## 2012-05-15 NOTE — Progress Notes (Signed)
Occupational Therapy Session Note  Patient Details  Name: [name] MRN: [id number] Date of Birth: Oct 05, 1928  Today's Date: 05/15/2012 Time: 7829-5621 Time Calculation (min): 45 min   Skilled Therapeutic Interventions/Progress Updates:    Pt sitting EOB eating breakfast.  Upon completion of breakfast pt initially stated that he didn't want to do anything and laid back in bed.  Pt finally agreed to sitting EOB and standing to bathe front perineal area and buttocks.  Pt requested use of urinal when standing and completed voiding while standing for approx 1 min before requesting to sit back on EOB.  Pt did not have clean clothes and donned clean hospital gown.  Pt performed sit>supine with max assist requiring assistance lifting both legs.  Pt stated he was exhausted and could not complete session.  Focus on activity tolerance, sit<>stand, bed mobility, and standing balance.  Therapy Documentation Precautions:  Precautions Precautions: Fall Precaution Comments: NG tube, multiple decubitus lcers to sacrum and lower legs Required Braces or Orthoses: Other Brace/Splint Other Brace/Splint: pressure relieving heel protectors when in bed Restrictions Weight Bearing Restrictions: No  General: General Amount of Missed OT Time (min): 15 Minutes  Pain: Pain Assessment Pain Assessment: No/denies pain  See FIM for current functional status  Therapy/Group: Individual Therapy  [name] 05/15/2012, 9:21 AM

## 2012-05-15 NOTE — Progress Notes (Signed)
SLP Cancellation Note  Treatment cancelled today due to patient's refusal to participate.  As a result, patient missed 30 minutes of skilled therapy.   [name], M.A., CCC-SLP [phone]  [name] 05/15/2012, 1:05 PM

## 2012-05-15 NOTE — Discharge Summary (Signed)
  Discharge summary job [id number]

## 2012-05-15 NOTE — Progress Notes (Signed)
Social Work Patient ID: [name], male   DOB: 23-Mar-1929, 78 y.o.   MRN: [id number] Spoke with MD regarding pt's plan.  Dr Lajoyce Corners was in this am and will place pt on his surgery schedule for tomorrow. Will talk with daughter and pre-cert pt's insurance.  Plan for transfer to acute for surgery tomorrow.

## 2012-05-15 NOTE — Progress Notes (Addendum)
Patient alert and oriented x 3. Patient able to demonstrate use of incentive spirometer. Ted hose at bedside. Patient denied any questions about procedure and the discussion with Dr. Lajoyce Corners.  No new complaints noted. Patient appetite poor. Patient reported feels "anxious about tomorrow. Emotional support provided. Ulcer noted to Left ear- gauze applied around tubing and tubing loosened. Rinaldo Cloud love notified. To have wound nurse check on ear and sacral wound.

## 2012-05-15 NOTE — Plan of Care (Signed)
Problem: RH SKIN INTEGRITY Goal: RH STG ABLE TO PERFORM INCISION/WOUND CARE W/ASSISTANCE STG Able To Perform Incision/Wound Care With max Assistance.  Outcome: Not Met (add Reason) Patient requires total assist from caregiver for wound care

## 2012-05-15 NOTE — Progress Notes (Signed)
Patient ID: [name], male   DOB: [birth year], 78 y.o.   MRN: [id number] Subjective/Complaints:  When i asked him the patient said that he was depressed regarding his prolonged hospital stay and multiple medical issues  A 12 point review of systems has been performed and if not noted above is otherwise negative.   Objective: Vital Signs: Blood pressure 109/68, pulse 82, temperature 98.3 F (36.8 C), temperature source Oral, resp. rate 18, height 5' 8.9" (1.75 m), weight 65.273 kg (143 lb 14.4 oz), SpO2 97.00%. No results found. No results found for this basename: WBC:2,HGB:2,HCT:2,PLT:2 in the last 72 hours No results found for this basename: NA:2,K:2,CL:2,CO2:2,GLUCOSE:2,BUN:2,CREATININE:2,CALCIUM:2 in the last 72 hours CBG (last 3)   Basename 05/15/12 0723 05/14/12 2054 05/14/12 1639  GLUCAP 300* 149* 191*    Wt Readings from Last 3 Encounters:  05/08/12 65.273 kg (143 lb 14.4 oz)  04/26/12 65 kg (143 lb 4.8 oz)  04/26/12 65 kg (143 lb 4.8 oz)    Physical Exam:  HENT:  Head: Normocephalic.  NGT in place but appears to be pulled out 8 inches or so. Missing many teeth. Mucosa pink but with white film over tongue and pharynx. Phonation better Eyes:  Pupils round and reactive to light  Neck: Neck supple. No thyromegaly present.  Cardiovascular: Regular rhythm. Normal rate Pulmonary/Chest: Breath sounds normal. No respiratory distress. He has no wheezes. Occasional upper airway sounds heard as well as rhonchi Abdominal: Bowel sounds are normal. He exhibits no distension.  Neurological: He is alert.  Patient is a poor historian. He was able to give his date of birth and age.   He would follow simple commands. limited insight and awareness. He had difficulty controlling his secretions and a weak cough. Strength was symmetrical but he was weak more proximally than distally (2-3 prox to 4 distally). No focal sensory loss was discerned.  Skin:  Skin: 4cm lateral mallelolus wound with  fibronecrotic tissue with continued sloughing. Buttock areas with decreased fibronecrotic tissue also (x 2) about 1-2cm in diameter which are stable Psychiatric:  Patient with improved awareness and insight. Appears fatigued and affect is flatter   Assessment/Plan: 1. Functional deficits secondary to sepsis/deconditioning which require 3+ hours per day of interdisciplinary therapy in a comprehensive inpatient rehab setting. Physiatrist is providing close team supervision and 24 hour management of active medical problems listed below. Physiatrist and rehab team continue to assess barriers to discharge/monitor patient progress toward functional and medical goals.  Needs left BKA, transfer directly to surgical service this week. It makes no sense to send him home first.  FIM: FIM - Bathing Bathing Steps Patient Completed: Chest;Right Arm;Left Arm;Front perineal area;Right upper leg;Left upper leg Bathing: 3: Mod-Patient completes 5-7 43f 10 parts or 50-74%  FIM - Upper Body Dressing/Undressing Upper body dressing/undressing steps patient completed: Thread/unthread right sleeve of pullover shirt/dresss;Thread/unthread left sleeve of pullover shirt/dress;Put head through opening of pull over shirt/dress;Pull shirt over trunk Upper body dressing/undressing: 5: Set-up assist to: Obtain clothing/put away FIM - Lower Body Dressing/Undressing Lower body dressing/undressing steps patient completed: Thread/unthread right underwear leg;Thread/unthread left underwear leg;Thread/unthread right pants leg;Thread/unthread left pants leg Lower body dressing/undressing: 3: Mod-Patient completed 50-74% of tasks  FIM - Toileting Toileting: 0: Activity did not occur  FIM - Diplomatic Services operational officer Devices: Grab bars;Elevated toilet seat Toilet Transfers: 0-Activity did not occur  FIM - Banker Devices: Manufacturing systems engineer Transfer: 3: Sit > Supine: Mod  A (lifting assist/Pt. 50-74%/lift 2 legs);5:  Bed > Chair or W/C: Supervision (verbal cues/safety issues);5: Chair or W/C > Bed: Supervision (verbal cues/safety issues)  FIM - Locomotion: Wheelchair Distance: 75' Locomotion: Wheelchair: 1: Travels less than 50 ft with supervision, cueing or coaxing FIM - Locomotion: Ambulation Locomotion: Ambulation Assistive Devices: Designer, industrial/product Ambulation/Gait Assistance: 5: Supervision Locomotion: Ambulation: 0: Activity did not occur (Pt declined)  Comprehension Comprehension Mode: Auditory Comprehension: 5-Understands complex 90% of the time/Cues < 10% of the time  Expression Expression Mode: Verbal Expression: 5-Expresses complex 90% of the time/cues < 10% of the time  Social Interaction Social Interaction: 5-Interacts appropriately 90% of the time - Needs monitoring or encouragement for participation or interaction.  Problem Solving Problem Solving: 5-Solves basic 90% of the time/requires cueing < 10% of the time  Memory Memory: 5-Recognizes or recalls 90% of the time/requires cueing < 10% of the time Medical Problem List and Plan:  1. deconditioning related to sepsis multiple medical complications/metabolic encephalopathy/respiratory failure. Will discuss with critical care medicine on weaning of prednisone  2. DVT Prophylaxis/Anticoagulation: Subcutaneous heparin. Monitor platelet counts and any signs of bleeding  3. Pain Management: Lortab as needed. Monitor with increased mobility  4. Neuropsych: This patient is not capable of making decisions on his/her own behalf.  5. Dysphagia. -off ivf  -phonation is better and throat is less painful as a whole  -on D1 nectars currently   6.NSTEMI. Followup per cardiology services. Continue aspirin therapy as well as Lopressor 12.5 mg twice a day (hold today). No current chest pain or shortness of breath  7. Esophageal candidiasis. Diflucan dced  -nystatin to tongue 8. Diabetes mellitus with  peripheral neuropathy. Hemoglobin A1c 10.5.   Check blood sugars a.c. and at bedtime   -changed lantus to nph to avoid am hypoglycemia  And sugar this am was 300. Add low dose of nph to night also. 9. Right lateral malleolus osteomyelitis- per ortho 10. Anemia. Further drop in hgb today to 5.9. No signs of blood loss. Verify results. Probably will need a transfusion 11. Hypothyroidism. Synthroid  12. Medical noncompliance. Will discuss at length with patient and family the need to maintain medical regimen 13. Leukocytosis: wbc's continue to rise. He has been on chronic prednisone and has osteomyelitis and UTI  -Dr. Lajoyce Corners is recommending a right BKA.  Pt is ok with this as long it "gets me home". Surgery this week pending contact with the surgeon 14. Hyponatremia: follow up labs  -on diet currently  - 15. Mood: he feels depressed and this is likely what we are seeing with decreased energy and appetite  -begin low dose remeron at night LOS (Days) 19 A FACE TO FACE EVALUATION WAS PERFORMED  SWARTZ,ZACHARY T 05/15/2012, 7:29 AM

## 2012-05-15 NOTE — Progress Notes (Signed)
Physical Therapy Note  Patient Details  Name: Herberth Deharo MRN: [id number] Date of Birth: [birth year] Today's Date: 05/15/2012  1000  Pt refused 60 minute PT  AM session . "  1450  Pt refused 30 minute PT  PM session./nursing aware.   apHOUT,JIM 05/15/2012, 7:55 AM

## 2012-05-15 NOTE — Progress Notes (Signed)
Social Work Patient ID: [name], male   DOB: 01/02/1929, 78 y.o.   MRN: [id number] Spoke with insurance-[employer] regarding precert for pt's surgery.  Give to [employer] to continue to follow pt while on acute. Pt reports he guesses he is ready.  Aware this worker will no longer follow on acute.

## 2012-05-16 ENCOUNTER — Encounter (HOSPITAL_COMMUNITY)
Admission: RE | Disposition: A | Discharge status: Other Acute Inpatient Hospital | Payer: Self-pay | Source: Ambulatory Visit | Attending: Physical Medicine & Rehabilitation

## 2012-05-16 ENCOUNTER — Inpatient Hospital Stay (HOSPITAL_COMMUNITY)
Admission: AD | Admit: 2012-05-16 | Discharge: 2012-05-23 | DRG: 616 | Disposition: A | Discharge status: Skilled Nursing Facility | Payer: Medicare Other | Source: Ambulatory Visit | Attending: Orthopedic Surgery | Admitting: Orthopedic Surgery

## 2012-05-16 ENCOUNTER — Encounter (HOSPITAL_COMMUNITY): Payer: Self-pay | Admitting: Anesthesiology

## 2012-05-16 DIAGNOSIS — M86679 Other chronic osteomyelitis, unspecified ankle and foot: Secondary | ICD-10-CM | POA: Diagnosis present

## 2012-05-16 DIAGNOSIS — E1139 Type 2 diabetes mellitus with other diabetic ophthalmic complication: Secondary | ICD-10-CM | POA: Diagnosis present

## 2012-05-16 DIAGNOSIS — E119 Type 2 diabetes mellitus without complications: Secondary | ICD-10-CM

## 2012-05-16 DIAGNOSIS — Z8249 Family history of ischemic heart disease and other diseases of the circulatory system: Secondary | ICD-10-CM

## 2012-05-16 DIAGNOSIS — L89109 Pressure ulcer of unspecified part of back, unspecified stage: Secondary | ICD-10-CM | POA: Diagnosis present

## 2012-05-16 DIAGNOSIS — J4489 Other specified chronic obstructive pulmonary disease: Secondary | ICD-10-CM | POA: Diagnosis present

## 2012-05-16 DIAGNOSIS — D649 Anemia, unspecified: Secondary | ICD-10-CM | POA: Diagnosis present

## 2012-05-16 DIAGNOSIS — J449 Chronic obstructive pulmonary disease, unspecified: Secondary | ICD-10-CM | POA: Diagnosis present

## 2012-05-16 DIAGNOSIS — L89309 Pressure ulcer of unspecified buttock, unspecified stage: Secondary | ICD-10-CM | POA: Diagnosis present

## 2012-05-16 DIAGNOSIS — L89159 Pressure ulcer of sacral region, unspecified stage: Secondary | ICD-10-CM

## 2012-05-16 DIAGNOSIS — L8995 Pressure ulcer of unspecified site, unstageable: Secondary | ICD-10-CM | POA: Diagnosis present

## 2012-05-16 DIAGNOSIS — E785 Hyperlipidemia, unspecified: Secondary | ICD-10-CM | POA: Diagnosis present

## 2012-05-16 DIAGNOSIS — A4902 Methicillin resistant Staphylococcus aureus infection, unspecified site: Secondary | ICD-10-CM | POA: Diagnosis present

## 2012-05-16 DIAGNOSIS — L8990 Pressure ulcer of unspecified site, unspecified stage: Secondary | ICD-10-CM | POA: Diagnosis present

## 2012-05-16 DIAGNOSIS — Z794 Long term (current) use of insulin: Secondary | ICD-10-CM

## 2012-05-16 DIAGNOSIS — E11319 Type 2 diabetes mellitus with unspecified diabetic retinopathy without macular edema: Secondary | ICD-10-CM | POA: Diagnosis present

## 2012-05-16 DIAGNOSIS — I10 Essential (primary) hypertension: Secondary | ICD-10-CM | POA: Diagnosis present

## 2012-05-16 DIAGNOSIS — Z9849 Cataract extraction status, unspecified eye: Secondary | ICD-10-CM

## 2012-05-16 DIAGNOSIS — M9080 Osteopathy in diseases classified elsewhere, unspecified site: Secondary | ICD-10-CM | POA: Diagnosis present

## 2012-05-16 DIAGNOSIS — E13622 Other specified diabetes mellitus with other skin ulcer: Secondary | ICD-10-CM

## 2012-05-16 DIAGNOSIS — I509 Heart failure, unspecified: Secondary | ICD-10-CM | POA: Diagnosis present

## 2012-05-16 DIAGNOSIS — I252 Old myocardial infarction: Secondary | ICD-10-CM

## 2012-05-16 DIAGNOSIS — E43 Unspecified severe protein-calorie malnutrition: Secondary | ICD-10-CM | POA: Diagnosis present

## 2012-05-16 DIAGNOSIS — E1142 Type 2 diabetes mellitus with diabetic polyneuropathy: Secondary | ICD-10-CM | POA: Diagnosis present

## 2012-05-16 DIAGNOSIS — L03119 Cellulitis of unspecified part of limb: Secondary | ICD-10-CM

## 2012-05-16 DIAGNOSIS — L97309 Non-pressure chronic ulcer of unspecified ankle with unspecified severity: Secondary | ICD-10-CM | POA: Diagnosis present

## 2012-05-16 DIAGNOSIS — E039 Hypothyroidism, unspecified: Secondary | ICD-10-CM | POA: Diagnosis present

## 2012-05-16 DIAGNOSIS — E1149 Type 2 diabetes mellitus with other diabetic neurological complication: Secondary | ICD-10-CM | POA: Diagnosis present

## 2012-05-16 DIAGNOSIS — E1169 Type 2 diabetes mellitus with other specified complication: Principal | ICD-10-CM | POA: Diagnosis present

## 2012-05-16 DIAGNOSIS — Z87891 Personal history of nicotine dependence: Secondary | ICD-10-CM

## 2012-05-16 HISTORY — PX: AMPUTATION: SHX166

## 2012-05-16 LAB — GLUCOSE, CAPILLARY
Glucose-Capillary: 65 mg/dL — ABNORMAL LOW (ref 70–99)
Glucose-Capillary: 106 mg/dL — ABNORMAL HIGH (ref 70–99)
Glucose-Capillary: 98 mg/dL (ref 70–99)
Glucose-Capillary: 124 mg/dL — ABNORMAL HIGH (ref 70–99)

## 2012-05-16 LAB — SURGICAL PCR SCREEN
MRSA, PCR: NEGATIVE
Staphylococcus aureus: POSITIVE — AB

## 2012-05-16 SURGERY — AMPUTATION BELOW KNEE
Anesthesia: General | Site: Leg Lower | Laterality: Right | Wound class: Clean

## 2012-05-16 MED ORDER — CEFAZOLIN SODIUM-DEXTROSE 2-3 GM-% IV SOLR
INTRAVENOUS | Status: DC | PRN
  Administered 2012-05-16: 2 g via INTRAVENOUS

## 2012-05-16 MED ORDER — LIDOCAINE HCL (CARDIAC) 20 MG/ML IV SOLN
INTRAVENOUS | Status: DC | PRN
  Administered 2012-05-16: 100 mg via INTRAVENOUS

## 2012-05-16 MED ORDER — BIOTENE DRY MOUTH MT LIQD: 15.0000 mL | Freq: Two times a day (BID) | OROMUCOSAL | Status: DC

## 2012-05-16 MED ORDER — ONDANSETRON HCL 4 MG/2ML IJ SOLN
INTRAMUSCULAR | Status: DC | PRN
  Administered 2012-05-16: 4 mg via INTRAVENOUS

## 2012-05-16 MED ORDER — PROPOFOL 10 MG/ML IV BOLUS
INTRAVENOUS | Status: DC | PRN
  Administered 2012-05-16: 80 mg via INTRAVENOUS

## 2012-05-16 MED ORDER — 0.9 % SODIUM CHLORIDE (POUR BTL) OPTIME
TOPICAL | Status: DC | PRN
  Administered 2012-05-16: 1000 mL

## 2012-05-16 MED ORDER — ONDANSETRON HCL 4 MG/2ML IJ SOLN: 4.0000 mg | Freq: Once | INTRAMUSCULAR | Status: DC | PRN

## 2012-05-16 MED ORDER — DEXTROSE-NACL 5-0.45 % IV SOLN
INTRAVENOUS | Status: DC
  Administered 2012-05-16: 08:00:00 via INTRAVENOUS

## 2012-05-16 MED ORDER — SUCCINYLCHOLINE CHLORIDE 20 MG/ML IJ SOLN
INTRAMUSCULAR | Status: DC | PRN
  Administered 2012-05-16: 100 mg via INTRAVENOUS

## 2012-05-16 MED ORDER — DEXTROSE 50 % IV SOLN
INTRAVENOUS | Status: AC
  Filled 2012-05-16: qty 50

## 2012-05-16 MED ORDER — LACTATED RINGERS IV SOLN
INTRAVENOUS | Status: DC
  Administered 2012-05-16: 17:00:00 via INTRAVENOUS

## 2012-05-16 MED ORDER — HYDROMORPHONE HCL PF 1 MG/ML IJ SOLN: 0.2500 mg | INTRAMUSCULAR | Status: DC | PRN

## 2012-05-16 MED ORDER — FENTANYL CITRATE 0.05 MG/ML IJ SOLN
INTRAMUSCULAR | Status: DC | PRN
  Administered 2012-05-16: 50 ug via INTRAVENOUS

## 2012-05-16 MED ORDER — DEXTROSE 50 % IV SOLN
25.0000 mL | Freq: Once | INTRAVENOUS | Status: AC | PRN
  Administered 2012-05-16: 25 mL via INTRAVENOUS

## 2012-05-16 MED ORDER — LACTATED RINGERS IV SOLN
INTRAVENOUS | Status: DC | PRN
  Administered 2012-05-16: 18:00:00 via INTRAVENOUS

## 2012-05-16 MED ORDER — ROCURONIUM BROMIDE 100 MG/10ML IV SOLN
INTRAVENOUS | Status: DC | PRN
  Administered 2012-05-16: 10 mg via INTRAVENOUS

## 2012-05-16 SURGICAL SUPPLY — 45 items
BANDAGE ESMARK 6X9 LF (GAUZE/BANDAGES/DRESSINGS) ×1 IMPLANT
BANDAGE GAUZE ELAST BULKY 4 IN (GAUZE/BANDAGES/DRESSINGS) ×3 IMPLANT
BLADE SAW RECIP 87.9 MT (BLADE) ×2 IMPLANT
BLADE SURG 21 STRL SS (BLADE) ×2 IMPLANT
BNDG CMPR 9X6 STRL LF SNTH (GAUZE/BANDAGES/DRESSINGS)
BNDG COHESIVE 6X5 TAN STRL LF (GAUZE/BANDAGES/DRESSINGS) ×3 IMPLANT
BNDG ESMARK 6X9 LF (GAUZE/BANDAGES/DRESSINGS)
CLOTH BEACON ORANGE TIMEOUT ST (SAFETY) ×2 IMPLANT
COVER SURGICAL LIGHT HANDLE (MISCELLANEOUS) ×2 IMPLANT
CUFF TOURNIQUET SINGLE 34IN LL (TOURNIQUET CUFF) ×1 IMPLANT
CUFF TOURNIQUET SINGLE 44IN (TOURNIQUET CUFF) IMPLANT
DRAIN PENROSE 1/2X12 LTX STRL (WOUND CARE) IMPLANT
DRAPE EXTREMITY T 121X128X90 (DRAPE) ×2 IMPLANT
DRAPE PROXIMA HALF (DRAPES) ×4 IMPLANT
DRAPE U-SHAPE 47X51 STRL (DRAPES) ×3 IMPLANT
DRSG ADAPTIC 3X8 NADH LF (GAUZE/BANDAGES/DRESSINGS) ×2 IMPLANT
DRSG PAD ABDOMINAL 8X10 ST (GAUZE/BANDAGES/DRESSINGS) ×2 IMPLANT
DURAPREP 26ML APPLICATOR (WOUND CARE) ×2 IMPLANT
ELECT REM PT RETURN 9FT ADLT (ELECTROSURGICAL) ×2
ELECTRODE REM PT RTRN 9FT ADLT (ELECTROSURGICAL) ×1 IMPLANT
EVACUATOR 1/8 PVC DRAIN (DRAIN) IMPLANT
GLOVE BIOGEL PI IND STRL 9 (GLOVE) ×1 IMPLANT
GLOVE BIOGEL PI INDICATOR 9 (GLOVE) ×1
GLOVE SURG ORTHO 9.0 STRL STRW (GLOVE) ×2 IMPLANT
GOWN PREVENTION PLUS XLARGE (GOWN DISPOSABLE) ×2 IMPLANT
GOWN SRG XL XLNG 56XLVL 4 (GOWN DISPOSABLE) ×1 IMPLANT
GOWN STRL NON-REIN XL XLG LVL4 (GOWN DISPOSABLE) ×2
KIT BASIN OR (CUSTOM PROCEDURE TRAY) ×2 IMPLANT
KIT ROOM TURNOVER OR (KITS) ×2 IMPLANT
MANIFOLD NEPTUNE II (INSTRUMENTS) ×1 IMPLANT
NS IRRIG 1000ML POUR BTL (IV SOLUTION) ×2 IMPLANT
PACK GENERAL/GYN (CUSTOM PROCEDURE TRAY) ×2 IMPLANT
PAD ARMBOARD 7.5X6 YLW CONV (MISCELLANEOUS) ×4 IMPLANT
SPONGE GAUZE 4X4 12PLY (GAUZE/BANDAGES/DRESSINGS) ×2 IMPLANT
SPONGE LAP 18X18 X RAY DECT (DISPOSABLE) ×1 IMPLANT
STAPLER VISISTAT 35W (STAPLE) ×1 IMPLANT
STOCKINETTE IMPERVIOUS LG (DRAPES) ×2 IMPLANT
SUT PDS AB 1 CT  36 (SUTURE) ×2
SUT PDS AB 1 CT 36 (SUTURE) IMPLANT
SUT SILK 2 0 (SUTURE) ×2
SUT SILK 2-0 18XBRD TIE 12 (SUTURE) ×1 IMPLANT
TOWEL OR 17X24 6PK STRL BLUE (TOWEL DISPOSABLE) ×2 IMPLANT
TOWEL OR 17X26 10 PK STRL BLUE (TOWEL DISPOSABLE) ×2 IMPLANT
TUBE ANAEROBIC SPECIMEN COL (MISCELLANEOUS) IMPLANT
WATER STERILE IRR 1000ML POUR (IV SOLUTION) ×1 IMPLANT

## 2012-05-16 NOTE — Anesthesia Postprocedure Evaluation (Signed)
Anesthesia Post Note  Patient: [name]  Procedure(s) Performed: Procedure(s) (LRB): AMPUTATION BELOW KNEE (Right)  Anesthesia type: general  Patient location: PACU  Post pain: Pain level controlled  Post assessment: Patient's Cardiovascular Status Stable  Last Vitals:  Filed Vitals:   05/16/12 1901  BP: 146/82  Pulse: 101  Temp: 37.4 C  Resp: 23    Post vital signs: Reviewed and stable  Level of consciousness: sedated  Complications: No apparent anesthesia complications

## 2012-05-16 NOTE — Anesthesia Preprocedure Evaluation (Addendum)
Anesthesia Evaluation  Patient identified by MRN, date of birth, ID band Patient awake    Reviewed: Allergy & Precautions, H&P , NPO status , Patient's Chart, lab work & pertinent test results  Airway Mallampati: I TM Distance: >3 FB Neck ROM: full    Dental   Pulmonary COPDformer smoker,          Cardiovascular hypertension, + Past MI Rhythm:regular Rate:Normal     Neuro/Psych  Neuromuscular disease    GI/Hepatic   Endo/Other  diabetes, Type 2Hypothyroidism   Renal/GU      Musculoskeletal   Abdominal   Peds  Hematology   Anesthesia Other Findings   Reproductive/Obstetrics                          Anesthesia Physical Anesthesia Plan  ASA: III  Anesthesia Plan: General   Post-op Pain Management:    Induction: Intravenous  Airway Management Planned: Oral ETT  Additional Equipment:   Intra-op Plan:   Post-operative Plan: Possible Post-op intubation/ventilation  Informed Consent: I have reviewed the patients History and Physical, chart, labs and discussed the procedure including the risks, benefits and alternatives for the proposed anesthesia with the patient or authorized representative who has indicated his/her understanding and acceptance.     Plan Discussed with: CRNA, Anesthesiologist and Surgeon  Anesthesia Plan Comments:         Anesthesia Quick Evaluation

## 2012-05-16 NOTE — Op Note (Signed)
OPERATIVE REPORT  DATE OF SURGERY: 05/16/2012  PATIENT:  [name],  78 y.o. male  PRE-OPERATIVE DIAGNOSIS:  osteomyelitis right fibula  POST-OPERATIVE DIAGNOSIS:  Same  PROCEDURE:  Procedure(s): AMPUTATION BELOW KNEE  SURGEON:  Surgeon(s): Nadara Mustard, MD  ANESTHESIA:   general  EBL:  Minimal ML  SPECIMEN:  Source of Specimen:  Right leg  TOURNIQUET:  * Missing tourniquet times found for documented tourniquets in log:  [id number] *  PROCEDURE DETAILS: Patient is an 78 year old gentleman with peripheral vascular disease osteomyelitis and a Wagner grade 3 ulcer of the right fibula. He has failed conservative care and presents at this time for transtibial amputation. Risks and benefits were discussed including nonhealing of the wound need for higher left leg amputation. Patient and his family state to understand and wish to proceed at this time. Description of procedure patient was brought to the operating room and underwent a general anesthetic. After adequate levels of anesthesia were obtained patient's right foot was prepped out with an impervious stockinette and the right lower extremity was prepped using DuraPrep and draped into a sterile field the infected foot was draped out of the surgical field. A transverse incision was made 11 cm distal to the tibial tubercle this curved proximally and a large posterior flap was created. The tibia was transected 1 cm proximal to the skin incision beveled anteriorly and the fibula was transected just proximal to the tibial incision. The sciatic nerve was pulled cut and allowed to retract the vascular bundles were suture ligated x2 each with 2-0 silk the the tourniquet was deflated after 5 minutes. Hemostasis was obtained. The deep and superficial fascial layers were closed using #1 PDS. The skin was closed using staples. The wound was covered with Adaptic orthopedic sponges AB dressing Kerlix and Coban. Patient was extubated taken to the PACU in  stable condition.  PLAN OF CARE: Admit to inpatient   PATIENT DISPOSITION:  PACU - hemodynamically stable.   Nadara Mustard, MD 05/16/2012 6:45 PM

## 2012-05-16 NOTE — Transfer of Care (Signed)
Immediate Anesthesia Transfer of Care Note  Patient: [name]  Procedure(s) Performed: Procedure(s) (LRB) with comments: AMPUTATION BELOW KNEE (Right) - Right Below Knee Amputation  Patient Location: PACU  Anesthesia Type: General  Level of Consciousness: awake, alert , oriented and patient cooperative  Airway & Oxygen Therapy: Patient Spontanous Breathing and Patient connected to face mask oxygen  Post-op Assessment: Report given to PACU RN, Post -op Vital signs reviewed and stable and Patient moving all extremities X 4  Post vital signs: Reviewed and stable  Complications: No apparent anesthesia complications

## 2012-05-16 NOTE — Consult Note (Signed)
WOC consult Note Reason for Consult:asked to evaluate sacral area, left posterior thigh.  Noted also to have Stage II pressure ulcer to the left ischial area.   Wound type: unclear etiology of the gluteal area and left posterior thigh.  Concerning for abscess in both area. Very indurated and I am able to express purulent material from both sites.  Bedside nursing reports the induration has been present and erythema when he was admitted but has progressively gotten worse.  The open lesions with the purulent material is new just in last 24-48 hours.  Pt has several other areas that are hard nodules over the back of the leg and hip.   Measurement: induration of the gluteal cleft: 10cm x 8cm, with multiple 10 or more open draining lesions scattered over the indurated area.  Indurated 1cm x1cm lesion of the left posterior thigh with purulent material draining and able to express with pressure.  Left ischial Stage II pressure ulcer: 3cm x 2cm x 0.2cm    Wound bed: see above for abscessed areas Left ishcial: pink and moist with partial thickness skin loss  Drainage (amount, consistency, odor)  Purulent drainage from the gluteal areas, left thigh (bedside nurse to cx.) Minimal drainage from the left ischial, no odor   Periwound: intact at all sites  Dressing procedure/placement/frequency: I have discussed complicated case with Jesusita Oka A. PA with rehab.  Pt going for amputation of the left foot today per orthopeadics.  Would be ideal to have I &D of these other areas at the same time while under general anesthesia however may not be possible with surgery schedule.  Dan to discuss needs for this with Dr. Riley Kill.  Will only address topical care for all areas which are not curative tx.   Will add air mattress for pressure relief for ischial area and chair redistribution while sitting.  Wound cultures ordered and silicone foam dressings only for these areas to absorb this drainage for now.   Will follow along  minimally to see if further wound care needed post eval. Per surgery.  Thanks  Satcha Storlie [employer], CWOCN [phone])

## 2012-05-16 NOTE — Progress Notes (Signed)
Inpatient Diabetes Program Recommendations  AACE/ADA: New Consensus Statement on Inpatient Glycemic Control (2013)  Target Ranges:  Prepandial:   less than 140 mg/dL      Peak postprandial:   less than 180 mg/dL (1-2 hours)      Critically ill patients:  140 - 180 mg/dL   Reason for Visit: CBG this morning is 65 mg/dL.  Patient received 10 units of NPH last PM.  Consider reducing PM NPH to 7 units q HS.  NPH regimen seems to be much better for this patient.  Will continue to follow.

## 2012-05-16 NOTE — Progress Notes (Signed)
Patient ID: [name], male   DOB: 1928/11/27, 78 y.o.   MRN: [id number] Subjective/Complaints:  cbg low again today. Awaiting surgery A 12 point review of systems has been performed and if not noted above is otherwise negative.   Objective: Vital Signs: Blood pressure 93/50, pulse 85, temperature 99.1 F (37.3 C), temperature source Oral, resp. rate 19, height 5' 8.9" (1.75 m), weight 62.551 kg (137 lb 14.4 oz), SpO2 89.00%. No results found. No results found for this basename: WBC:2,HGB:2,HCT:2,PLT:2 in the last 72 hours No results found for this basename: NA:2,K:2,CL:2,CO2:2,GLUCOSE:2,BUN:2,CREATININE:2,CALCIUM:2 in the last 72 hours CBG (last 3)   Basename 05/15/12 2057 05/15/12 1651 05/15/12 1208  GLUCAP 171* 145* 252*    Wt Readings from Last 3 Encounters:  05/15/12 62.551 kg (137 lb 14.4 oz)  05/15/12 62.551 kg (137 lb 14.4 oz)  04/26/12 65 kg (143 lb 4.8 oz)    Physical Exam:  HENT:  Head: Normocephalic.  NGT in place but appears to be pulled out 8 inches or so. Missing many teeth. Mucosa pink but with white film over tongue and pharynx. Phonation better Eyes:  Pupils round and reactive to light  Neck: Neck supple. No thyromegaly present.  Cardiovascular: Regular rhythm. Normal rate Pulmonary/Chest: Breath sounds normal. No respiratory distress. He has no wheezes. Occasional upper airway sounds heard as well as rhonchi Abdominal: Bowel sounds are normal. He exhibits no distension.  Neurological: He is alert.  Patient is a poor historian. He was able to give his date of birth and age.   He would follow simple commands. limited insight and awareness. He had difficulty controlling his secretions and a weak cough. Strength was symmetrical but he was weak more proximally than distally (2-3 prox to 4 distally). No focal sensory loss was discerned.  Skin:  Skin: 4cm lateral mallelolus wound with fibronecrotic tissue with continued sloughing. Buttock areas with decreased  fibronecrotic tissue also (x 2) about 1-2cm in diameter which are stable Psychiatric:  Patient with improved awareness and insight. Appears fatigued and affect is flatter   Assessment/Plan: 1. Functional deficits secondary to sepsis/deconditioning which require 3+ hours per day of interdisciplinary therapy in a comprehensive inpatient rehab setting. Physiatrist is providing close team supervision and 24 hour management of active medical problems listed below. Physiatrist and rehab team continue to assess barriers to discharge/monitor patient progress toward functional and medical goals.  Left bka today.. orderd ivf to keep cbg from bottoming out this am  FIM: FIM - Bathing Bathing Steps Patient Completed: Chest;Right Arm;Left Arm;Front perineal area;Right upper leg;Left upper leg Bathing: 3: Mod-Patient completes 5-7 90f 10 parts or 50-74%  FIM - Upper Body Dressing/Undressing Upper body dressing/undressing steps patient completed: Thread/unthread right sleeve of pullover shirt/dresss;Thread/unthread left sleeve of pullover shirt/dress;Put head through opening of pull over shirt/dress;Pull shirt over trunk Upper body dressing/undressing: 5: Set-up assist to: Obtain clothing/put away FIM - Lower Body Dressing/Undressing Lower body dressing/undressing steps patient completed: Thread/unthread right underwear leg;Thread/unthread left underwear leg;Thread/unthread right pants leg;Thread/unthread left pants leg Lower body dressing/undressing: 3: Mod-Patient completed 50-74% of tasks  FIM - Toileting Toileting: 0: Activity did not occur  FIM - Diplomatic Services operational officer Devices: Grab bars;Elevated toilet seat Toilet Transfers: 0-Activity did not occur  FIM - Banker Devices: Manufacturing systems engineer Transfer: 3: Sit > Supine: Mod A (lifting assist/Pt. 50-74%/lift 2 legs);5: Bed > Chair or W/C: Supervision (verbal cues/safety issues);5: Chair  or W/C > Bed: Supervision (verbal cues/safety issues)  FIM -  Locomotion: Wheelchair Distance: 75' Locomotion: Wheelchair: 1: Travels less than 50 ft with supervision, cueing or coaxing FIM - Locomotion: Ambulation Locomotion: Ambulation Assistive Devices: Designer, industrial/product Ambulation/Gait Assistance: 5: Supervision Locomotion: Ambulation: 0: Activity did not occur (Pt declined)  Comprehension Comprehension Mode: Auditory Comprehension: 5-Understands complex 90% of the time/Cues < 10% of the time  Expression Expression Mode: Verbal Expression: 5-Expresses complex 90% of the time/cues < 10% of the time  Social Interaction Social Interaction: 5-Interacts appropriately 90% of the time - Needs monitoring or encouragement for participation or interaction.  Problem Solving Problem Solving: 5-Solves basic 90% of the time/requires cueing < 10% of the time  Memory Memory: 5-Recognizes or recalls 90% of the time/requires cueing < 10% of the time Medical Problem List and Plan:  1. deconditioning related to sepsis multiple medical complications/metabolic encephalopathy/respiratory failure. Will discuss with critical care medicine on weaning of prednisone  2. DVT Prophylaxis/Anticoagulation: Subcutaneous heparin. Monitor platelet counts and any signs of bleeding  3. Pain Management: Lortab as needed. Monitor with increased mobility  4. Neuropsych: This patient is not capable of making decisions on his/her own behalf.  5. Dysphagia.    -phonation is better and throat is less painful as a whole  -on D1 nectars currently--npo for surgery today   6.NSTEMI. Followup per cardiology services. Continue aspirin therapy as well as Lopressor 12.5 mg twice a day (hold today). No current chest pain or shortness of breath  7. Esophageal candidiasis. Diflucan dced 8. Diabetes mellitus with peripheral neuropathy. Hemoglobin A1c 10.5.   Check blood sugars a.c. and at bedtime   -changed lantus to nph to avoid  am hypoglycemia  And sugar this am was 300. Add low dose of nph to night also. 9. Right lateral malleolus osteomyelitis- per ortho 10. Anemia. Further drop in hgb today to 5.9. No signs of blood loss. Verify results. Probably will need a transfusion 11. Hypothyroidism. Synthroid  12. Medical noncompliance. Will discuss at length with patient and family the need to maintain medical regimen 13. Leukocytosis: right bka today 14. Hyponatremia: follow up labs 15. Mood: he feels depressed and this is likely what we are seeing with decreased energy and appetite  -begin low dose remeron at night LOS (Days) 20 A FACE TO FACE EVALUATION WAS PERFORMED  Demontre Padin T 05/16/2012, 7:24 AM

## 2012-05-16 NOTE — Progress Notes (Signed)
Pt's cbg=65 this am, hypoglycemic protocol was initiated, 25 ml of D50 was given, Dr. Riley Kill made aware, orders given. Will continue to monitor pt. ----Ademola Vert, rn

## 2012-05-16 NOTE — Progress Notes (Signed)
Speech Language Pathology Discharge Summary  Patient Details  Name: [name] MRN: [id number] Date of Birth: 30-Sep-1928  Today's Date: 05/16/2012  Patient has met 4 of 4 long term goals.  Patient to discharge at overall Min;Supervision level.   Reasons goals not met: N/A   Clinical Impression/Discharge Summary: Pt has made functional gains and has met all LTG's this reporting period. Currently, pt requires supervision semantic and verbal cues for recall of new, daily information. Pt is also tolerating Dys. 1 textures with nectar-thick liquids and requires supervision verbal cues to utilize swallowing compensatory strategies. Recommend pt to continue conservative diet due to change in medical status and need for surgery which will most likely impact is overall endurance and ability to compensate for oral-pharyngeal impairments. Pt will discharge to acute care setting for surgery. Recommend pt receive f/u skilled SLP intervention in acute care setting to assess tolerance with current diet.   Care Partner:  Caregiver Able to Provide Assistance:  (TBD)  Type of Caregiver Assistance: Physical;Cognitive  Recommendation:   (acute care)  Rationale for SLP Follow Up: Maximize swallowing safety  Equipment: N/A   Reasons for discharge: Treatment goals met;Change in medical status (pt will d/c to acute for surgery)    See FIM for current functional status  [name] 05/16/2012, 1:49 PM

## 2012-05-16 NOTE — Progress Notes (Signed)
Occupational Therapy Discharge Summary  Patient Details  Name: [name] MRN: [id number] Date of Birth: September 03, 1929  Today's Date: 05/16/2012  Patient has met 7 of 10 long term goals due to improved activity tolerance, improved balance, postural control, ability to compensate for deficits, improved attention, improved awareness and improved coordination.  Pt progress in performing BADLs (bathing, dressing, toileting, and toilet transfers) has been inconsistent since admission.  Pt often required min>mod encouragement to participate during therapy sessions.  Pt continues to require multiple rest breaks during therapy sessions.  Pt O2 sats remained >90% on 2L O2 but would drop when removing to don shirt and wash face. Patient to discharge at overall min-mod assist level.  Patient is discharging to acute part of hospital for surgery.   Reasons goals not met: Tub bench transfer goal not met, transfer not attempted at this time; not applicable. LB dressing goal of minimal assistance not met, patient currently requires mod assist for LB dressing. Patient also did not meet independent memory goal, patient requires supervision -> min assist with memory aspects. Patient continues to be inconsistent with BADLs.   Recommendation:  Patient will benefit from ongoing skilled OT services in acute setting to continue to advance functional skills in the area of BADL.  Equipment: No equipment provided Discharge to acute for surgery  Reasons for discharge: change in medical status and patient discharging to acute for surgery  Patient/family agrees with progress made and goals achieved: Yes  ADL ADL Eating: Supervision/safety Where Assessed-Eating: Edge of bed Grooming: Supervision/safety Where Assessed-Grooming: Sitting at sink Upper Body Bathing: Supervision/safety Where Assessed-Upper Body Bathing: Sitting at sink Lower Body Bathing: Contact guard Where Assessed-Lower Body Bathing: Sitting at  sink;Standing at sink Upper Body Dressing: Supervision/safety Where Assessed-Upper Body Dressing: Sitting at sink Lower Body Dressing: Moderate assistance Where Assessed-Lower Body Dressing: Sitting at sink;Standing at sink Toileting: Contact guard Where Assessed-Toileting: Bedside Commode Toilet Transfer: Close supervision Toilet Transfer Method: Proofreader: Gaffer: Not assessed Film/video editor: Not assessed ADL Comments: Pt with limited endurance and decreased flexibility to reach his feet for dressing tasks.  Mod assist needed for sit to stand with mod instructional cueing for hand placement during sit to stand.  Vision/Perception  Vision - History Baseline Vision: Wears glasses only for reading Patient Visual Report: No change from baseline   Cognition Arousal/Alertness: Awake/alert Orientation Level: Oriented X4 Attention: Selective Focused Attention: Appears intact Selective Attention: Appears intact Memory: Impaired Memory Impairment: Decreased recall of new information  Sensation Sensation Light Touch: Impaired Detail Light Touch Impaired Details: Impaired LUE Stereognosis: Not tested Hot/Cold: Not tested Proprioception: Not tested Coordination Gross Motor Movements are Fluid and Coordinated: Yes Fine Motor Movements are Fluid and Coordinated: No  Trunk/Postural Assessment  Cervical Assessment Cervical Assessment: Within Functional Limits Thoracic Assessment Thoracic Assessment: Within Functional Limits Lumbar Assessment Lumbar Assessment: Within Functional Limits Postural Control Trunk Control: Pt with forward trunk flexion in standing and with ambulation to the bathroom   Balance Static Sitting Balance Static Sitting - Balance Support: Left upper extremity supported;Right upper extremity supported Static Sitting - Level of Assistance: 5: Stand by assistance  Extremity/Trunk Assessment RUE  Assessment RUE Assessment: Within Functional Limits LUE Assessment LUE Assessment: Within Functional Limits  See FIM for current functional status  [name] 05/16/2012, 11:12 AM

## 2012-05-17 LAB — GLUCOSE, CAPILLARY
Glucose-Capillary: 311 mg/dL — ABNORMAL HIGH (ref 70–99)
Glucose-Capillary: 363 mg/dL — ABNORMAL HIGH (ref 70–99)

## 2012-05-17 MED ORDER — INSULIN GLARGINE 100 UNIT/ML ~~LOC~~ SOLN
15.0000 [IU] | Freq: Every day | SUBCUTANEOUS | Status: DC
  Administered 2012-05-17 – 2012-05-22 (×5): 15 [IU] via SUBCUTANEOUS

## 2012-05-17 MED ORDER — ASPIRIN 81 MG PO CHEW
81.0000 mg | CHEWABLE_TABLET | Freq: Every day | ORAL | Status: DC
  Administered 2012-05-17 – 2012-05-23 (×7): 81 mg via ORAL
  Filled 2012-05-17 (×7): qty 1

## 2012-05-17 MED ORDER — RANITIDINE HCL 150 MG/10ML PO SYRP
150.0000 mg | ORAL_SOLUTION | Freq: Two times a day (BID) | ORAL | Status: DC
  Administered 2012-05-17 – 2012-05-23 (×13): 150 mg via ORAL
  Filled 2012-05-17 (×14): qty 10

## 2012-05-17 MED ORDER — PRO-STAT SUGAR FREE PO LIQD
30.0000 mL | Freq: Two times a day (BID) | ORAL | Status: DC
  Administered 2012-05-17 – 2012-05-23 (×9): 30 mL via ORAL
  Filled 2012-05-17 (×15): qty 30

## 2012-05-17 MED ORDER — HYDROCODONE-ACETAMINOPHEN 5-325 MG PO TABS
1.0000 | ORAL_TABLET | ORAL | Status: DC | PRN
  Administered 2012-05-18 – 2012-05-19 (×2): 1 via ORAL
  Filled 2012-05-17 (×2): qty 1

## 2012-05-17 MED ORDER — FERROUS SULFATE 300 (60 FE) MG/5ML PO SYRP
300.0000 mg | ORAL_SOLUTION | Freq: Three times a day (TID) | ORAL | Status: DC
  Administered 2012-05-17 – 2012-05-23 (×19): 300 mg
  Filled 2012-05-17 (×21): qty 5

## 2012-05-17 MED ORDER — SODIUM CHLORIDE 0.45 % IV SOLN: 20.0000 mL | INTRAVENOUS | Status: DC

## 2012-05-17 MED ORDER — SODIUM CHLORIDE 0.9 % IV SOLN: INTRAVENOUS | Status: DC

## 2012-05-17 MED ORDER — LEVOTHYROXINE SODIUM 75 MCG PO TABS
75.0000 ug | ORAL_TABLET | Freq: Every day | ORAL | Status: DC
  Administered 2012-05-18 – 2012-05-23 (×6): 75 ug via ORAL
  Filled 2012-05-17 (×8): qty 1

## 2012-05-17 MED ORDER — OXYCODONE-ACETAMINOPHEN 5-325 MG PO TABS
1.0000 | ORAL_TABLET | ORAL | Status: DC | PRN
  Administered 2012-05-19: 1 via ORAL
  Filled 2012-05-17: qty 1

## 2012-05-17 MED ORDER — ALBUTEROL SULFATE (5 MG/ML) 0.5% IN NEBU: 2.5000 mg | INHALATION_SOLUTION | RESPIRATORY_TRACT | Status: DC | PRN

## 2012-05-17 MED ORDER — LEVOTHYROXINE SODIUM 75 MCG PO TABS: 75.0000 ug | ORAL_TABLET | Freq: Every day | ORAL | Status: DC

## 2012-05-17 MED ORDER — ENSURE PUDDING PO PUDG
1.0000 | Freq: Three times a day (TID) | ORAL | Status: DC
  Administered 2012-05-17 – 2012-05-23 (×16): 1 via ORAL

## 2012-05-17 MED ORDER — IPRATROPIUM BROMIDE 0.02 % IN SOLN: 0.5000 mg | RESPIRATORY_TRACT | Status: DC | PRN

## 2012-05-17 MED ORDER — INSULIN ASPART 100 UNIT/ML ~~LOC~~ SOLN
0.0000 [IU] | Freq: Three times a day (TID) | SUBCUTANEOUS | Status: DC
  Administered 2012-05-17 (×2): 5 [IU] via SUBCUTANEOUS
  Administered 2012-05-18: 1 [IU] via SUBCUTANEOUS
  Administered 2012-05-18: 2 [IU] via SUBCUTANEOUS
  Administered 2012-05-19: 3 [IU] via SUBCUTANEOUS
  Administered 2012-05-19: 2 [IU] via SUBCUTANEOUS
  Administered 2012-05-19: 3 [IU] via SUBCUTANEOUS
  Administered 2012-05-20: 6 [IU] via SUBCUTANEOUS
  Administered 2012-05-21: 2 [IU] via SUBCUTANEOUS
  Administered 2012-05-21: 1 [IU] via SUBCUTANEOUS
  Administered 2012-05-21: 3 [IU] via SUBCUTANEOUS
  Administered 2012-05-22 – 2012-05-23 (×2): 2 [IU] via SUBCUTANEOUS

## 2012-05-17 MED ORDER — METOPROLOL TARTRATE 25 MG/10 ML ORAL SUSPENSION
12.5000 mg | Freq: Two times a day (BID) | ORAL | Status: DC
  Administered 2012-05-17 – 2012-05-23 (×8): 12.5 mg
  Filled 2012-05-17 (×14): qty 5

## 2012-05-17 MED ORDER — INSULIN ASPART 100 UNIT/ML ~~LOC~~ SOLN
0.0000 [IU] | Freq: Every day | SUBCUTANEOUS | Status: DC
  Administered 2012-05-17: 5 [IU] via SUBCUTANEOUS
  Administered 2012-05-20: 3 [IU] via SUBCUTANEOUS
  Administered 2012-05-21: 2 [IU] via SUBCUTANEOUS

## 2012-05-17 MED ORDER — OXEPA PO LIQD
1000.0000 mL | ORAL | Status: DC
  Filled 2012-05-17 (×3): qty 1000

## 2012-05-17 NOTE — Consult Note (Signed)
See WOC note from 05/16/12, pt needs general surgery consult for I & D or surgical debridement of large sacral abscess.  For full note /discription see WOC consult note.   Oliver Neuwirth [place], [place] [phone]

## 2012-05-17 NOTE — Evaluation (Signed)
Physical Therapy Evaluation Patient Details Name: [name] MRN: [id number] DOB: [birth year] Today's Date: 05/17/2012 Time: 2130-8657 PT Time Calculation (min): 26 min  PT Assessment / Plan / Recommendation Clinical Impression  Pt is an 78 y/o male admitted to the hospital in August 2013 with AMS, sepsis, and VDRF.  Pt transfered to CIR for therapy with transfer back to acute 05/16/12 for right LE BKA.  Pt continues with the below PT problem list requiring further therapy prior to d/c home.  Pt would benefit from acute PT to maximize independence and facilitate d/c back to CIR.    PT Assessment  Patient needs continued PT services    Follow Up Recommendations  Inpatient Rehab    Barriers to Discharge None      Equipment Recommendations  Defer to next venue    Recommendations for Other Services     Frequency Min 3X/week    Precautions / Restrictions Precautions Precautions: Fall Restrictions Weight Bearing Restrictions: Yes RLE Weight Bearing: Non weight bearing   Pertinent Vitals/Pain 0/10 during evaluation.      Mobility  Bed Mobility Bed Mobility: Supine to Sit;Sit to Supine Supine to Sit: 3: Mod assist;HOB flat Sit to Supine: 3: Mod assist;HOB flat Details for Bed Mobility Assistance: Assist to translate trunk anterior and slow descent to bed.  Assist also to right LE due to weakness to facilitate motion off/onto bed.  Cues for sequence, to increase participation, and to increase attention to task.  Limited by lethargy. Transfers Transfers: Sit to Stand;Stand to Sit;Squat Pivot Transfers (3 trials.) Sit to Stand: 2: Max assist;With upper extremity assist;From bed Stand to Sit: 3: Mod assist;With upper extremity assist;To bed Squat Pivot Transfers: 2: Max assist;With upper extremity assistance Details for Transfer Assistance: Assist at sacrum to translate trunk anterior with blocking to left knee to prevent buckling.  Pt unable to achieve full erect standing due to  weakness.  However, able to clear bottom from surface to perform squat pivot to [facility].  Cues for sequence throughout as well as safety. Ambulation/Gait Ambulation/Gait Assistance: Not tested (comment) Stairs: No Wheelchair Mobility Wheelchair Mobility: No    Exercises     PT Diagnosis: Generalized weakness;Difficulty walking  PT Problem List: Decreased strength;Decreased activity tolerance;Decreased balance;Decreased mobility;Decreased knowledge of use of DME PT Treatment Interventions: DME instruction;Gait training;Functional mobility training;Therapeutic activities;Therapeutic exercise;Balance training;Patient/family education   PT Goals Acute Rehab PT Goals PT Goal Formulation: With patient Time For Goal Achievement: 05/31/12 Potential to Achieve Goals: Good Pt will go Supine/Side to Sit: with supervision PT Goal: Supine/Side to Sit - Progress: Goal set today Pt will go Sit to Supine/Side: with supervision PT Goal: Sit to Supine/Side - Progress: Goal set today Pt will go Sit to Stand: with supervision PT Goal: Sit to Stand - Progress: Goal set today Pt will go Stand to Sit: with supervision PT Goal: Stand to Sit - Progress: Goal set today Pt will Ambulate: 1 - 15 feet;with supervision;with least restrictive assistive device PT Goal: Ambulate - Progress: Goal set today Pt will Perform Home Exercise Program: with supervision, verbal cues required/provided PT Goal: Perform Home Exercise Program - Progress: Goal set today  Visit Information  Last PT Received On: 05/17/12 Assistance Needed: +2    Subjective Data  Subjective: "I'm really weak right now." Patient Stated Goal: Get strong.   Prior Functioning  Home Living Lives With: Spouse Available Help at Discharge: Family Type of Home: House Home Access: Stairs to enter [employer] of Steps: 2 Entrance Stairs-Rails:  Right;Left;Can reach both Home Layout: One level Bathroom Shower/Tub: Agricultural engineer: Standard Bathroom Accessibility: Yes How Accessible: Accessible via walker Home Adaptive Equipment: Walker - rolling;Tub transfer bench;Bedside commode/3-in-1 Prior Function Level of Independence: Independent with assistive device(s) Able to Take Stairs?: Yes Driving: No Vocation: Retired Musician: No difficulties    Cognition  Overall Cognitive Status: Appears within functional limits for tasks assessed/performed Arousal/Alertness: Lethargic Orientation Level: Appears intact for tasks assessed Behavior During Session: Lethargic    Extremity/Trunk Assessment Right Upper Extremity Assessment RUE ROM/Strength/Tone: Within functional levels Left Upper Extremity Assessment LUE ROM/Strength/Tone: Within functional levels Right Lower Extremity Assessment RLE ROM/Strength/Tone: Deficits RLE ROM/Strength/Tone Deficits: New right AKA with 2/5 strength throughout. RLE Sensation: Deficits RLE Sensation Deficits: Phantom sensation of right foot. RLE Coordination: WFL - gross motor Left Lower Extremity Assessment LLE ROM/Strength/Tone: Deficits LLE ROM/Strength/Tone Deficits: 4-/5 LLE Sensation: WFL - Light Touch LLE Coordination: WFL - gross motor Trunk Assessment Trunk Assessment: Normal   Balance Balance Balance Assessed: Yes Static Sitting Balance Static Sitting - Balance Support: Bilateral upper extremity supported;Feet supported (Left LE supported on floor.) Static Sitting - Level of Assistance: 3: Mod assist (Able to progress to min (guard)) Static Sitting - Comment/# of Minutes: Pt able to sit EOB for 10 minutes with up to mod assist due to left/posterior lean.  Assist to attain and maintain midline trunk initially with pt progressing to min (guard).  End of Session PT - End of Session Equipment Utilized During Treatment: Gait belt Activity Tolerance: Patient limited by fatigue Patient left: in bed;with call bell/phone within reach;with bed alarm  set;Other (comment) (With CNA in the room.) Nurse Communication: Mobility status  GP     Cephus Shelling 05/17/2012, 2:00 PM  05/17/2012 Cephus Shelling, PT, DPT [phone]

## 2012-05-17 NOTE — Progress Notes (Signed)
Patient ID: [name], male   DOB: Jan 31, 1929, 78 y.o.   MRN: [id number] Postoperative day 1 right transtibial amputation. Patient is resting comfortably.  Consult social worker today for discharge to skilled nursing facility.

## 2012-05-17 NOTE — Progress Notes (Addendum)
INITIAL ADULT NUTRITION ASSESSMENT Date: 05/17/2012   Time: 2:37 PM Reason for Assessment: Wounds  ASSESSMENT: Male 78 y.o.  Dx: Transtibial amputation  Hx:  Past Medical History  Diagnosis Date  . Diabetes mellitus   . Cancer   . Thyroid disease   . Anemia   . Hyperlipidemia   . Neuropathy, diabetic   . Diabetic retinopathy     Related Meds:  Scheduled Meds:    . aspirin  81 mg Oral Daily  . feeding supplement  30 mL Oral BID WC  . ferrous sulfate  300 mg Per Tube TID  . insulin aspart  0-5 Units Subcutaneous QHS  . insulin aspart  0-9 Units Subcutaneous TID WC  . insulin glargine  15 Units Subcutaneous QHS  . levothyroxine  75 mcg Oral Q0600  . metoprolol tartrate  12.5 mg Per Tube BID  . ranitidine  150 mg Oral BID  . DISCONTD: levothyroxine  75 mcg Per Tube QAC breakfast   Continuous Infusions:    . sodium chloride    . sodium chloride    . feeding supplement (OXEPA)     PRN Meds:.albuterol, HYDROcodone-acetaminophen, ipratropium, oxyCODONE-acetaminophen, DISCONTD: ipratropium   Ht:  5'9"  Wt:   137 lbs  Ideal Wt: 68.4 kg s/p amputation % Ideal Wt: 86% Wt Readings from Last 10 Encounters:  05/15/12 137 lb 14.4 oz (62.551 kg)  05/15/12 137 lb 14.4 oz (62.551 kg)  04/26/12 143 lb 4.8 oz (65 kg)  04/26/12 143 lb 4.8 oz (65 kg)  11/22/11 138 lb 0.1 oz (62.6 kg)  10/20/11 138 lb (62.596 kg)    Food/Nutrition Related Hx: pt admitted from CIR for transtibial amputation, recently hospitalized with AMS, sepsis, VDRF  Labs:  CMP     Component Value Date/Time   NA 131* 05/09/2012 0545   K 4.1 05/09/2012 0545   CL 95* 05/09/2012 0545   CO2 30 05/09/2012 0545   GLUCOSE 223* 05/09/2012 0545   BUN 22 05/09/2012 0545   CREATININE 0.64 05/09/2012 0545   CALCIUM 8.1* 05/09/2012 0545   PROT 5.8* 05/03/2012 0655   ALBUMIN 1.4* 05/03/2012 0655   AST 18 05/03/2012 0655   ALT 18 05/03/2012 0655   ALKPHOS 123* 05/03/2012 0655   BILITOT 0.1* 05/03/2012 0655   GFRNONAA 89*  05/09/2012 0545   GFRAA >90 05/09/2012 0545    Intake/Output Summary (Last 24 hours) at 05/17/12 1437 Last data filed at 05/17/12 1203  Gross per 24 hour  Intake    640 ml  Output    250 ml  Net    390 ml     Diet Order: Dysphagia 1, nectar thick liquids  Supplements/Tube Feeding: Ensure pudding at CIR  IVF:     sodium chloride  sodium chloride  feeding supplement (OXEPA)    Estimated Nutritional Needs:   Kcal: 1670-2000 Protein: 80-100 grams Fluid: ~1.8 L/day  Pt admitted from CIR for transtibial amputation.  Pt also with sacral abscess that may need I&D.   Pt sleeping soundly s/p surgery, does not awake to voice x2.  Discussed with previous RD who states pt had made significant improvement in intake at CIR and was tolerating dysphagia diet well. RD to order continuation of supplement regimen from CIR. TFs ordered for pt, however, pt without access for enteral feeds.  Discussed with RN who feels TFs ordered in error in transfer from CIR.  NUTRITION DIAGNOSIS: -Increased nutrient needs  RELATED TO: s/p surgery, pt with wounds, possible I&D  AS  EVIDENCE BY: transtibial amputation, sacral abscess  MONITORING/EVALUATION(Goals): 1.  Food/Beverage; pt consuming >75% of meals on average.  Continues supplementation of Ensure Pudding daily. 2.  Wt/wt change; deter loss 3.  Skin; s/s healing at recent amputation and sacral wound  EDUCATION NEEDS: -No education needs identified at this time  INTERVENTION: 1. Supplements; Ensure pudding TID  Dietitian #:[phone]  DOCUMENTATION CODES Per approved criteria  -Underweight    Loyce Dys Sue-Ellen 05/17/2012, 2:37 PM

## 2012-05-17 NOTE — Progress Notes (Signed)
Spoke with wound nurse Melody regarding order for wound care consult by Dr. Lajoyce Corners. Stated she saw him on 4000 last night and told the staff that he needs a general surgery consult because that is a 8x10 hard absess that has multiple pus areas that is draining purulent foul odor drainage. Spoke with Dr. Lajoyce Corners about her concern and he stated, "ok, and continue wound care in the mean time." Patient also has wound on LLE, Left heel and Left lower buttock. Patient denies pain. Daughter of pain, whom is a home aide states, that the wounds originated from home and not the hospital. Patient continues to be repositioned q 2hrs, heel elevated and has a provolon boot which is not being worn. Will continue to monitor.

## 2012-05-17 NOTE — Progress Notes (Signed)
Utilization review completed. Surena Welge, RN, BSN. 

## 2012-05-17 NOTE — Progress Notes (Signed)
Physical Therapy Discharge Summary  Patient Details  Name: [name] MRN: [id number] Date of Birth: May 26, 1929  Patient has met 5 of 7 long term goals due to improved activity tolerance, improved balance and ability to compensate for deficits.  Patient to discharge at an ambulatory level Supervision/min assist and moderate assist for steps.   Patient's care partner unable  to provide the necessary physical assistance at discharge.  Reasons goals not met: Decreased participation, slow progress, medical complications including D/C to acute for transtibial amputation.   Recommendation:  Patient will benefit from ongoing skilled PT services in inpatient rehab post D/C from acute for current surgery to continue to advance safe functional mobility, address ongoing impairments in strength, endurance, impaired balance, increased need for assistance, and minimize fall risk.  Equipment: No equipment provided  Reasons for discharge: change in medical status  Patient/family agrees with progress made and goals achieved: Yes  PT Discharge Precautions/Restrictions  Fall, multiple skin lesions buttocks and bil. LEs     Mobility Bed Mobility Supine to Sit: 5: Supervision Sit to Supine: 3: Mod assist Transfers Sit to Stand: 5: Supervision Stand to Sit: 5: Supervision Stand Pivot Transfers: 5: Supervision Locomotion  Ambulation Ambulation: Yes Ambulation/Gait Assistance: 5: Supervision Ambulation Distance (Feet): 50 Feet Assistive device: Rolling walker Gait Gait: Yes Gait Pattern: Impaired Gait Pattern: Step-to pattern;Trunk flexed;Shuffle;Decreased stride length Stairs / Additional Locomotion Stairs: Yes Stairs Assistance: 3: Mod assist Stair Management Technique: Two rails;Step to pattern;Forwards Number of Stairs: 4  Corporate treasurer: Yes Wheelchair Assistance: 5: Investment banker, operational: Both upper extremities Wheelchair Parts Management:  Supervision/cueing Distance: 150'  Extremity Assessment      RLE Assessment RLE Assessment: Exceptions to [facility] RLE Strength RLE Overall Strength Comments: Pt continues to have significant weakness secondary, grossly 2+ to 3-/5 LLE Assessment LLE Assessment: Exceptions to [facility] LLE Strength LLE Overall Strength Comments: Pt continues to have significant weakness secondary, grossly 2+ to 3-/5  See FIM for current functional status  [name] 05/17/2012, 5:47 PM

## 2012-05-17 NOTE — H&P (Signed)
[name] is an 78 y.o. male.   Chief Complaint: Osteomyelitis right ankle HPI: Patient is an 78 year old gentleman with multiple medical problems including diabetes and peripheral vascular disease. Patient has had chronic ulceration and osteomyelitis of the right ankle he has failed conservative therapy and is admitted at this time for transtibial amputation from the rehabilitation facility.  Past Medical History  Diagnosis Date  . Diabetes mellitus   . Cancer   . Thyroid disease   . Anemia   . Hyperlipidemia   . Neuropathy, diabetic   . Diabetic retinopathy     Past Surgical History  Procedure Date  . Cataract extraction, bilateral   . Colonoscopy w/ polypectomy   . Esophagogastroduodenoscopy 04/25/2012    Procedure: ESOPHAGOGASTRODUODENOSCOPY (EGD);  Surgeon: [name] Dare, MD,FACG;  Location: [facility];  Service: Endoscopy;  Laterality: N/A;    Family History  Problem Relation Age of Onset  . Hypertension     Social History:  reports that he quit smoking about 5 years ago. His smoking use included Cigarettes. He smoked .25 packs per day. He has never used smokeless tobacco. He reports that he does not drink alcohol or use illicit drugs.  Allergies: No Known Allergies  Medications Prior to Admission  Medication Sig Dispense Refill  . albuterol (PROVENTIL) (5 MG/ML) 0.5% nebulizer solution Take 0.5 mLs (2.5 mg total) by nebulization every 2 (two) hours as needed for wheezing or shortness of breath.  20 mL    . aspirin 81 MG chewable tablet Chew 1 tablet (81 mg total) by mouth daily.      . feeding supplement (PRO-STAT SUGAR FREE 64) LIQD Take 30 mLs by mouth 2 (two) times daily with breakfast and lunch.  900 mL    . ferrous sulfate 300 (60 FE) MG/5ML syrup Place 5 mLs (300 mg total) into feeding tube 3 (three) times daily.  150 mL    . insulin aspart (NOVOLOG) 100 UNIT/ML injection Inject 0-20 Units into the skin every 4 (four) hours.  1 vial    . insulin glargine  (LANTUS) 100 UNIT/ML injection Inject 15 Units into the skin at bedtime.  10 mL    . ipratropium (ATROVENT) 0.02 % nebulizer solution Take 0.5 mg by nebulization every 2 (two) hours as needed. For shortness of breath      . levothyroxine (SYNTHROID, LEVOTHROID) 75 MCG tablet Take 75 mcg by mouth daily.      . metoprolol tartrate (LOPRESSOR) 25 mg/10 mL SUSP Place 5 mLs (12.5 mg total) into feeding tube 2 (two) times daily.      . Nutritional Supplements (FEEDING SUPPLEMENT, OXEPA,) LIQD Place 1,000 mLs into feeding tube continuous.      . ranitidine (ZANTAC) 150 MG/10ML syrup Take 10 mLs (150 mg total) by mouth 2 (two) times daily.  300 mL    . sodium chloride 0.45 % solution Inject 20 mLs into the vein continuous.      Marland Kitchen DISCONTD: ipratropium (ATROVENT) 0.02 % nebulizer solution Take 2.5 mLs (0.5 mg total) by nebulization every 2 (two) hours as needed.  75 mL    . DISCONTD: levothyroxine (SYNTHROID, LEVOTHROID) 75 MCG tablet Place 1 tablet (75 mcg total) into feeding tube daily before breakfast.        Results for orders placed during the hospital encounter of 05/16/12 (from the past 48 hour(s))  GLUCOSE, CAPILLARY     Status: Normal   Collection Time   05/16/12  8:11 PM      Component  Value Range Comment   Glucose-Capillary 98  70 - 99 mg/dL   GLUCOSE, CAPILLARY     Status: Normal   Collection Time   05/16/12 10:31 PM      Component Value Range Comment   Glucose-Capillary 87  70 - 99 mg/dL    No results found.  Review of Systems  All other systems reviewed and are negative.    Blood pressure 104/75, pulse 84, temperature 98.4 F (36.9 C), resp. rate 18, SpO2 99.00%. Physical Exam  On examination patient has a Wagner grade 3 ulcer over the lateral malleolus right ankle. He has exposed bone with chronic osteomyelitis. Patient has failed conservative wound therapy. Assessment/Plan Assessment: Osteomyelitis with ulceration and diabetes right ankle.  Plan will plan for a right  transtibial amputation. Risks and benefits were discussed with the patient and his family they state they understand and wish to proceed at this time plan for discharge to skilled nursing facility after surgery.  [name] V 05/17/2012, 6:31 AM

## 2012-05-17 NOTE — Progress Notes (Signed)
Clinical Social Work Department BRIEF PSYCHOSOCIAL ASSESSMENT 05/17/2012  Patient:  [name],[name]     Account Number:  [id number]     Admit date:  05/16/2012  Clinical Social Worker:  Burnard Hawthorne  Date/Time:  05/17/2012 05:13 PM  Referred by:  Physician  Date Referred:  05/17/2012 Referred for  Other - See comment   Other Referral:   Return to CIR- [facility] vs SNF   Interview type:  Other - See comment Other interview type:   Patient and telephonic interview with duaghter- Katy Fitch    PSYCHOSOCIAL DATA Living Status:  OTHER Admitted from facility:  [facility] Level of care:   Primary support name:  [name] (c) [phone] Primary support relationship to patient:  CHILD, ADULT Degree of support available:   Very Strong support    Wife is also invovled as are other family members    CURRENT CONCERNS Current Concerns  Post-Acute Placement   Other Concerns:   CIR Vs SNF    SOCIAL WORK ASSESSMENT / PLAN 78 year old male- admitted to acute care for CIR- now with new BKA of Right leg. Patient was very sleepy during interview and stated that he preferred that CSW speak with his daughter [name].  When asked if he wanted to return to CIR after hospitalization- he stated "I'd rather go home." Patient will require further rehab before home can be considered.  Spoke with pt's daughter [name] who wants patient to return to CIR if possible. Discussed possible need for SNF search as "back up" and she is agreeable but really wants CIR. Spoke with Genie- CIR- they will assess patient and order obtained for CIR consult.  Will re-evaluate on Monday.   Assessment/plan status:  Psychosocial Support/Ongoing Assessment of Needs Other assessment/ plan:   Information/referral to community resources:   SNF list left in room - to be utilized if CIR is unable to accept    PATIENT'S/FAMILY'S RESPONSE TO PLAN OF CARE: Patient was able to respond to CSW but appeared  sleepy. He defers to his daughter [name] for further information. Daughter wants return to CIR if at all possible.

## 2012-05-17 NOTE — Progress Notes (Addendum)
Clinical Social Work Department CLINICAL SOCIAL WORK PLACEMENT NOTE 05/17/2012  Patient:  [name],[name]  Account Number:  [id number] Admit date:  05/16/2012  Clinical Social Worker:  Lupita Leash Libbie Bartley, LCSWA  Date/time:  05/17/2012 05:22 PM  Clinical Social Work is seeking post-discharge placement for this patient at the following level of care:   SKILLED NURSING   (*CSW will update this form in Epic as items are completed)   05/17/2012  Patient/family provided with [facility] Department of Clinical Social Work's list of facilities offering this level of care within the geographic area requested by the patient (or if unable, by the patient's family).  05/17/2012  Patient/family informed of their freedom to choose among providers that offer the needed level of care, that participate in Medicare, Medicaid or managed care program needed by the patient, have an available bed and are willing to accept the patient.  05/17/2012  Patient/family informed of [facility]' ownership interest in [facility], as well as of the fact that they are under no obligation to receive care at this facility.  PASARR submitted to EDS on 05/17/2012 PASARR number received from [employer] on 05/18/2012    FL2 transmitted to all facilities in geographic area requested by pt/family on  05/22/12 FL2 transmitted to all facilities within larger geographic area on   Patient informed that his/her managed care company has contracts with or will negotiate with  certain facilities, including the following:   NA     Patient/family informed of bed offers received: 05/23/12   Patient chooses bed at [facility] and Rehab Physician recommends and patient chooses bed at    Patient to be transferred to [facility] on  05/23/12 Patient to be transferred to facility by Ambulance  The following physician request were entered in Epic:   Additional Comments: DC today to SNF. Patient and his daughter are very pleased with d/c  plan. Notified SNF and pt's nurse- Foye Clock of d/c. CSW has left message for Ms. Tripp- [place] DSS to call back regarding new Medicaid application that has recently been completed per pt's daughterVelna Hatchet.  Will need Medicaid number to obtain authorization.   Lorri Frederick. [place]  [phone]

## 2012-05-18 LAB — GLUCOSE, CAPILLARY
Glucose-Capillary: 104 mg/dL — ABNORMAL HIGH (ref 70–99)
Glucose-Capillary: 138 mg/dL — ABNORMAL HIGH (ref 70–99)
Glucose-Capillary: 184 mg/dL — ABNORMAL HIGH (ref 70–99)
Glucose-Capillary: 137 mg/dL — ABNORMAL HIGH (ref 70–99)

## 2012-05-18 LAB — WOUND CULTURE

## 2012-05-18 MED ORDER — CHLORHEXIDINE GLUCONATE CLOTH 2 % EX PADS
6.0000 | MEDICATED_PAD | Freq: Every day | CUTANEOUS | Status: AC
  Administered 2012-05-19 – 2012-05-23 (×5): 6 via TOPICAL

## 2012-05-18 MED ORDER — MUPIROCIN 2 % EX OINT
1.0000 "application " | TOPICAL_OINTMENT | Freq: Two times a day (BID) | CUTANEOUS | Status: AC
  Administered 2012-05-18 – 2012-05-23 (×10): 1 via NASAL
  Filled 2012-05-18: qty 22

## 2012-05-18 NOTE — Progress Notes (Signed)
MD notified regarding +MRSA culture in gluteal wound.  Placed on Contact precautions per protocol.  MD in surgery and spoke with OR RN.  MD will call back with any new orders.  Will continue to monitor.

## 2012-05-18 NOTE — Progress Notes (Signed)
Subjective: Pt stable - pain controlled   Objective: Vital signs in last 24 hours: Temp:  [98.4 F (36.9 C)-98.7 F (37.1 C)] 98.7 F (37.1 C) (09/14 0709) Pulse Rate:  [54-86] 84  (09/14 0709) Resp:  [18] 18  (09/14 0709) BP: (89-102)/(40-51) 102/45 mmHg (09/14 0709) SpO2:  [100 %] 100 % (09/14 0709)  Intake/Output from previous day: 09/13 0701 - 09/14 0700 In: 240 [P.O.:240] Out: -  Intake/Output this shift: Total I/O In: -  Out: 300 [Urine:300]  Exam:  dressing dry  Labs: No results found for this basename: HGB:5 in the last 72 hours No results found for this basename: WBC:2,RBC:2,HCT:2,PLT:2 in the last 72 hours No results found for this basename: NA:2,K:2,CL:2,CO2:2,BUN:2,CREATININE:2,GLUCOSE:2,CALCIUM:2 in the last 72 hours No results found for this basename: LABPT:2,INR:2 in the last 72 hours  Assessment/Plan: For placement when ready   DEAN,GREGORY SCOTT 05/18/2012, 8:48 AM

## 2012-05-19 ENCOUNTER — Encounter (HOSPITAL_COMMUNITY): Payer: Self-pay | Admitting: *Deleted

## 2012-05-19 LAB — GLUCOSE, CAPILLARY: Glucose-Capillary: 177 mg/dL — ABNORMAL HIGH (ref 70–99)

## 2012-05-19 MED ORDER — VANCOMYCIN HCL 1000 MG IV SOLR
750.0000 mg | Freq: Two times a day (BID) | INTRAVENOUS | Status: AC
  Administered 2012-05-19 (×2): 750 mg via INTRAVENOUS
  Filled 2012-05-19 (×2): qty 750

## 2012-05-19 NOTE — Progress Notes (Signed)
ANTIBIOTIC CONSULT NOTE - INITIAL  Pharmacy Consult for vancomycin Indication: MRSA in wound  No Known Allergies  Patient Measurements: Height: 5\' 9"  (175.3 cm) Weight: 138 lb 0.1 oz (62.6 kg) IBW/kg (Calculated) : 70.7   Vital Signs: Temp: 99.5 F (37.5 C) (09/14 2153) Temp src: Oral (09/14 2153) BP: 112/62 mmHg (09/14 2153) Pulse Rate: 60  (09/14 2153)  Microbiology: Recent Results (from the past 720 hour(s))  URINE CULTURE     Status: Normal   Collection Time   04/30/12 12:13 PM      Component Value Range Status Comment   Specimen Description URINE, RANDOM   Final    Special Requests NONE   Final    Culture  Setup Time 04/30/2012 13:00   Final    Colony Count >=100,000 COLONIES/ML   Final    Culture     Final    Value: Multiple bacterial morphotypes present, none predominant. Suggest appropriate recollection if clinically indicated.   Report Status 05/02/2012 FINAL   Final   WOUND CULTURE     Status: Normal   Collection Time   05/03/12  8:30 AM      Component Value Range Status Comment   Specimen Description WOUND FOOT   Final    Special Requests RIGHT LATERAL MALLEOLUS   Final    Gram Stain     Final    Value: RARE WBC PRESENT, PREDOMINANTLY PMN     NO SQUAMOUS EPITHELIAL CELLS SEEN     NO ORGANISMS SEEN   Culture     Final    Value: ABUNDANT STAPHYLOCOCCUS AUREUS     Note: RIFAMPIN AND GENTAMICIN SHOULD NOT BE USED AS SINGLE DRUGS FOR TREATMENT OF STAPH INFECTIONS. This organism DOES NOT demonstrate inducible Clindamycin resistance in vitro.   Report Status 05/06/2012 FINAL   Final    Organism ID, Bacteria STAPHYLOCOCCUS AUREUS   Final   URINE CULTURE     Status: Normal   Collection Time   05/03/12 11:22 AM      Component Value Range Status Comment   Specimen Description URINE, RANDOM   Final    Special Requests NONE   Final    Culture  Setup Time 05/03/2012 12:15   Final    Colony Count 75,000 COLONIES/ML   Final    Culture STENOTROPHOMONAS MALTOPHILIA    Final    Report Status 05/06/2012 FINAL   Final    Organism ID, Bacteria STENOTROPHOMONAS MALTOPHILIA   Final   SURGICAL PCR SCREEN     Status: Abnormal   Collection Time   05/16/12  7:43 AM      Component Value Range Status Comment   MRSA, PCR NEGATIVE  NEGATIVE Final    Staphylococcus aureus POSITIVE (*) NEGATIVE Final   WOUND CULTURE     Status: Normal   Collection Time   05/16/12 12:48 PM      Component Value Range Status Comment   Specimen Description WOUND   Final    Special Requests DRAINAGE FROM GLUTEAL WOUNDS   Final    Gram Stain     Final    Value: MODERATE WBC PRESENT,BOTH PMN AND MONONUCLEAR     NO SQUAMOUS EPITHELIAL CELLS SEEN     ABUNDANT GRAM POSITIVE COCCI     IN PAIRS ABUNDANT GRAM NEGATIVE RODS     ABUNDANT GRAM POSITIVE RODS   Culture     Final    Value: ABUNDANT METHICILLIN RESISTANT STAPHYLOCOCCUS AUREUS     Note: RIFAMPIN  AND GENTAMICIN SHOULD NOT BE USED AS SINGLE DRUGS FOR TREATMENT OF STAPH INFECTIONS. This organism DOES NOT demonstrate inducible Clindamycin resistance in vitro. CRITICAL RESULT CALLED TO, READ BACK BY AND VERIFIED WITH: SUZY R 9/14 @1330       BY REAMM   Report Status 05/18/2012 FINAL   Final    Organism ID, Bacteria METHICILLIN RESISTANT STAPHYLOCOCCUS AUREUS   Final     Medical History: Past Medical History  Diagnosis Date  . Diabetes mellitus   . Cancer   . Thyroid disease   . Anemia   . Hyperlipidemia   . Neuropathy, diabetic   . Diabetic retinopathy     Medications:  Prescriptions prior to admission  Medication Sig Dispense Refill  . albuterol (PROVENTIL) (5 MG/ML) 0.5% nebulizer solution Take 0.5 mLs (2.5 mg total) by nebulization every 2 (two) hours as needed for wheezing or shortness of breath.  20 mL    . aspirin 81 MG chewable tablet Chew 1 tablet (81 mg total) by mouth daily.      . feeding supplement (PRO-STAT SUGAR FREE 64) LIQD Take 30 mLs by mouth 2 (two) times daily with breakfast and lunch.  900 mL    . ferrous  sulfate 300 (60 FE) MG/5ML syrup Place 5 mLs (300 mg total) into feeding tube 3 (three) times daily.  150 mL    . insulin aspart (NOVOLOG) 100 UNIT/ML injection Inject 0-20 Units into the skin every 4 (four) hours.  1 vial    . insulin glargine (LANTUS) 100 UNIT/ML injection Inject 15 Units into the skin at bedtime.  10 mL    . ipratropium (ATROVENT) 0.02 % nebulizer solution Take 0.5 mg by nebulization every 2 (two) hours as needed. For shortness of breath      . levothyroxine (SYNTHROID, LEVOTHROID) 75 MCG tablet Take 75 mcg by mouth daily.      . metoprolol tartrate (LOPRESSOR) 25 mg/10 mL SUSP Place 5 mLs (12.5 mg total) into feeding tube 2 (two) times daily.      . Nutritional Supplements (FEEDING SUPPLEMENT, OXEPA,) LIQD Place 1,000 mLs into feeding tube continuous.      . ranitidine (ZANTAC) 150 MG/10ML syrup Take 10 mLs (150 mg total) by mouth 2 (two) times daily.  300 mL    . sodium chloride 0.45 % solution Inject 20 mLs into the vein continuous.      Marland Kitchen DISCONTD: ipratropium (ATROVENT) 0.02 % nebulizer solution Take 2.5 mLs (0.5 mg total) by nebulization every 2 (two) hours as needed.  75 mL    . DISCONTD: levothyroxine (SYNTHROID, LEVOTHROID) 75 MCG tablet Place 1 tablet (75 mcg total) into feeding tube daily before breakfast.       Scheduled:    . aspirin  81 mg Oral Daily  . Chlorhexidine Gluconate Cloth  6 each Topical Q0600  . feeding supplement  1 Container Oral TID BM  . feeding supplement  30 mL Oral BID WC  . ferrous sulfate  300 mg Per Tube TID  . insulin aspart  0-5 Units Subcutaneous QHS  . insulin aspart  0-9 Units Subcutaneous TID WC  . insulin glargine  15 Units Subcutaneous QHS  . levothyroxine  75 mcg Oral Q0600  . metoprolol tartrate  12.5 mg Per Tube BID  . mupirocin ointment  1 application Nasal BID  . ranitidine  150 mg Oral BID  . vancomycin  750 mg Intravenous Q12H    Assessment: 78yo male with sacral gluteal wound with Cx that  resulted with MRSA, to  begin vancomycin; per MD only two doses for now until plans made.  Plan:  Will give vancomycin 750mg  IV Q12H x2 and f/u plans.  Colleen Can PharmD BCPS 05/19/2012,12:14 AM

## 2012-05-19 NOTE — Progress Notes (Signed)
Patient's gluteal wound has a foul odor, drainage, and +MRSA cx.  Previous WOC note recommended Surgery Consult for possible I/D.  Called MD on call.  MD to discuss with attending.  Placed new mepilex on wound per order.  Wound continues to drain moderate amount of purulent, yellow, odorous drainage and requires more than daily dressing changes.  Turning pt Q2H.  Will continue to monitor.

## 2012-05-19 NOTE — Progress Notes (Signed)
Subjective: Minimal pain   Objective: Vital signs in last 24 hours: Temp:  [99 F (37.2 C)-99.5 F (37.5 C)] 99 F (37.2 C) (09/15 0546) Pulse Rate:  [60-74] 64  (09/15 0546) Resp:  [16-20] 16  (09/15 0546) BP: (97-114)/(39-64) 114/64 mmHg (09/15 0546) SpO2:  [97 %-100 %] 97 % (09/15 0546) Weight:  [62.6 kg (138 lb 0.1 oz)] 62.6 kg (138 lb 0.1 oz) (09/14 2300)  Intake/Output from previous day: 09/14 0701 - 09/15 0700 In: 240 [P.O.:240] Out: 1450 [Urine:1450] Intake/Output this shift:    Exam:  No cellulitis present  Labs: No results found for this basename: HGB:5 in the last 72 hours No results found for this basename: WBC:2,RBC:2,HCT:2,PLT:2 in the last 72 hours No results found for this basename: NA:2,K:2,CL:2,CO2:2,BUN:2,CREATININE:2,GLUCOSE:2,CALCIUM:2 in the last 72 hours No results found for this basename: LABPT:2,INR:2 in the last 72 hours  Assessment/Plan: Turned MRSA positive yesterday - 2 doses post op vanc given - placement this week   Leisha Trinkle SCOTT 05/19/2012, 7:48 AM

## 2012-05-20 ENCOUNTER — Encounter (HOSPITAL_COMMUNITY): Payer: Self-pay | Admitting: Orthopedic Surgery

## 2012-05-20 LAB — GLUCOSE, CAPILLARY
Glucose-Capillary: 77 mg/dL (ref 70–99)
Glucose-Capillary: 266 mg/dL — ABNORMAL HIGH (ref 70–99)
Glucose-Capillary: 289 mg/dL — ABNORMAL HIGH (ref 70–99)

## 2012-05-20 NOTE — Progress Notes (Signed)
Inpatient Diabetes Program Recommendations  AACE/ADA: New Consensus Statement on Inpatient Glycemic Control (2013)  Target Ranges:  Prepandial:   less than 140 mg/dL      Peak postprandial:   less than 180 mg/dL (1-2 hours)      Critically ill patients:  140 - 180 mg/dL   Reason for Visit: Problems with glycemic control  Inpatient Diabetes Program Recommendations Insulin - Basal: Currently receiving Lantus 15 units at HS.  Please consider decrease in Lantus dose since am glucose 77 mg/dl-- under target range. Correction (SSI): - Insulin - Meal Coverage: - HgbA1C: -  Note:  Results for GOBLE, FUDALA (MRN [id number]) as of 05/20/2012 15:52  Ref. Range 05/19/2012 07:05 05/19/2012 11:40 05/19/2012 12:01 05/19/2012 16:30 05/19/2012 22:28 05/20/2012 07:25 05/20/2012 11:44 05/20/2012 12:26  Glucose-Capillary Latest Range: 70-99 mg/dL 409 (H) 811 (H) 914 (H) 234 (H) 177 (H) 77 48 (L) 91

## 2012-05-20 NOTE — Progress Notes (Signed)
Physical Therapy Treatment Patient Details Name: Cordarious Zeek MRN: [id number] DOB: Jan 22, 1929 Today's Date: 05/20/2012 Time: 9147-8295 PT Time Calculation (min): 25 min  PT Assessment / Plan / Recommendation Comments on Treatment Session  Pt aggreable to PT ther-ex and attempted sit to stand transfers.  Patient was able to perform multiple ther-ex with good technique.  Patient did ilicit some muscular fatigue towards completion of ther-ex.      Follow Up Recommendations  Inpatient Rehab    Barriers to Discharge        Equipment Recommendations  Defer to next venue    Recommendations for Other Services    Frequency Min 3X/week   Plan Discharge plan remains appropriate    Precautions / Restrictions Precautions Precautions: Fall Restrictions RLE Weight Bearing: Non weight bearing   Pertinent Vitals/Pain No pain stated at this time    Mobility  Bed Mobility Rolling Right: 4: Min guard;With rail Right Sidelying to Sit: 3: Mod assist;With rails;HOB elevated (HOB 30) Supine to Sit: 3: Mod assist;With rails Sitting - Scoot to Edge of Bed: 2: Max assist Details for Bed Mobility Assistance: pt with mod v/c for sequence task, and upright posture. Pt requires BIL UE to balance at EOB Transfers Transfers: Sit to Stand;Stand to Sit;Squat Pivot Transfers Sit to Stand: 2: Max assist;With upper extremity assist;From bed;From elevated surface Stand to Sit: 2: Max assist;With upper extremity assist;To elevated surface;To chair/3-in-1 Details for Transfer Assistance: Pt motivated to attempt standing, but requires max assist to stand with rw 3 attempts Ambulation/Gait Ambulation/Gait Assistance: Not tested (comment)    Exercises General Exercises - Lower Extremity Long Arc Quad: AROM;20 reps;Right Amputee Exercises Quad Sets: Strengthening;Right;10 reps;Seated Hip ABduction/ADduction: Strengthening;Right;10 reps;Seated Knee Flexion: Strengthening;Right;10 reps;Seated Knee Extension:  Strengthening;Right;10 reps;Seated Straight Leg Raises: Strengthening;Right;10 reps;Seated     PT Goals Acute Rehab PT Goals PT Goal: Sit to Stand - Progress: Progressing toward goal PT Goal: Stand to Sit - Progress: Progressing toward goal PT Goal: Perform Home Exercise Program - Progress: Progressing toward goal  Visit Information  Last PT Received On: 05/20/12 Assistance Needed: +2    Subjective Data  Subjective: I'm very cold Patient Stated Goal: To be able to do more   Cognition  Overall Cognitive Status: Appears within functional limits for tasks assessed/performed Arousal/Alertness: Awake/alert Orientation Level: Appears intact for tasks assessed Behavior During Session: [facility] for tasks performed Cognition - Other Comments: Pt with cueing to use spirometer though reports knowing how to use it. Pt no awareness of void of bladder but when questioning cues verbalized that gown was wet    Balance  Static Sitting Balance Static Sitting - Balance Support: Bilateral upper extremity supported;Feet supported Static Sitting - Level of Assistance: 3: Mod assist Static Sitting - Comment/# of Minutes: ~10 minutes  End of Session PT - End of Session Equipment Utilized During Treatment: Gait belt Activity Tolerance: Patient limited by fatigue Patient left: in chair;with call bell/phone within reach;with nursing in room Nurse Communication: Mobility status   GP     Fabio Asa 05/20/2012, 12:33 PM Charlotte Crumb, PT DPT  [phone]

## 2012-05-20 NOTE — Consult Note (Signed)
Reason for Consult: Sacral decubitus ulceration  Referring Physician: Dr. Ethelda Chick [name] is an 78 y.o. male.  HPI: [name] is an 78 yo African American male with multiple medical problems who reports that his health began to decline dramatically in the 5 to 6 months prior to his admission 04/04/12 for sepsis, CHF and NSTEMI. He was critically ill on admission and remained on the ventilator initially.   He presented with multiple wound including the sacral wound and a Wagner grade 3 ulcer of the right lateral malleolus with osteomyelitis of the fibula. He stabilized from his initial course and was transferred to inpatient rehab, but developed worsening of his ankle wound and was readmitted to the acute hospital by Dr. Lajoyce Corners. He underwent a right transtibial BKA amputation On 04/26/12 by Dr. Lajoyce Corners. He continues to have a significant ulceration of the sacral area with a small open area of the region centrally and multiple other scattered open areas through a very large area of induration measuring at least 8cm x 10cm. He has been placed on a pressure relief mattress.  His lengthy hospital course also included significant difficulty with nutrition with dysphagia and Candida esophagitis. His last albumin on 05/03/12 was 1.4.  He reports very poor mobility of at least 5 to 6 months duration.   We are consulted for management of the sacral wound.  Past Medical History  Diagnosis Date  . Diabetes mellitus   . Cancer   . Thyroid disease   . Anemia   . Hyperlipidemia   . Neuropathy, diabetic   . Diabetic retinopathy     Past Surgical History  Procedure Date  . Cataract extraction, bilateral   . Colonoscopy w/ polypectomy   . Esophagogastroduodenoscopy 04/25/2012    Procedure: ESOPHAGOGASTRODUODENOSCOPY (EGD);  Surgeon: Meryl Dare, MD,FACG;  Location: [facility];  Service: Endoscopy;  Laterality: N/A;  . Amputation 05/16/2012    Procedure: AMPUTATION BELOW KNEE;  Surgeon: Nadara Mustard,  MD;  Location: [facility] OR;  Service: Orthopedics;  Laterality: Right;  Right Below Knee Amputation    Family History  Problem Relation Age of Onset  . Hypertension      Social History:  reports that he quit smoking about 5 years ago. His smoking use included Cigarettes. He smoked .25 packs per day. He has never used smokeless tobacco. He reports that he does not drink alcohol or use illicit drugs.  Allergies: No Known Allergies  Medications: I have reviewed the patient's current medications.  Results for orders placed during the hospital encounter of 05/16/12 (from the past 48 hour(s))  GLUCOSE, CAPILLARY     Status: Abnormal   Collection Time   05/18/12  9:31 PM      Component Value Range Comment   Glucose-Capillary 137 (*) 70 - 99 mg/dL   GLUCOSE, CAPILLARY     Status: Abnormal   Collection Time   05/19/12  7:05 AM      Component Value Range Comment   Glucose-Capillary 154 (*) 70 - 99 mg/dL   GLUCOSE, CAPILLARY     Status: Abnormal   Collection Time   05/19/12 11:40 AM      Component Value Range Comment   Glucose-Capillary 212 (*) 70 - 99 mg/dL   GLUCOSE, CAPILLARY     Status: Abnormal   Collection Time   05/19/12 12:01 PM      Component Value Range Comment   Glucose-Capillary 208 (*) 70 - 99 mg/dL    Comment 1 Notify  RN     GLUCOSE, CAPILLARY     Status: Abnormal   Collection Time   05/19/12  4:30 PM      Component Value Range Comment   Glucose-Capillary 234 (*) 70 - 99 mg/dL    Comment 1 Notify RN     GLUCOSE, CAPILLARY     Status: Abnormal   Collection Time   05/19/12 10:28 PM      Component Value Range Comment   Glucose-Capillary 177 (*) 70 - 99 mg/dL   GLUCOSE, CAPILLARY     Status: Normal   Collection Time   05/20/12  7:25 AM      Component Value Range Comment   Glucose-Capillary 77  70 - 99 mg/dL   GLUCOSE, CAPILLARY     Status: Abnormal   Collection Time   05/20/12 11:44 AM      Component Value Range Comment   Glucose-Capillary 48 (*) 70 - 99 mg/dL    Comment 1  Documented in Chart      Comment 2 Notify RN     GLUCOSE, CAPILLARY     Status: Normal   Collection Time   05/20/12 12:26 PM      Component Value Range Comment   Glucose-Capillary 91  70 - 99 mg/dL    Comment 1 Documented in Chart      Comment 2 Notify RN     GLUCOSE, CAPILLARY     Status: Abnormal   Collection Time   05/20/12  4:41 PM      Component Value Range Comment   Glucose-Capillary 266 (*) 70 - 99 mg/dL    Comment 1 Documented in Chart      Comment 2 Notify RN       No results found.  Review of Systems  Constitutional: Positive for malaise/fatigue.  HENT: Negative.   Eyes: Negative.   Respiratory: Positive for shortness of breath.   Gastrointestinal: Positive for nausea and constipation.  Genitourinary: Negative.   Musculoskeletal:       Poor ability to ambulate prior to amputation  Neurological: Positive for weakness.  Endo/Heme/Allergies: Negative.   Psychiatric/Behavioral: Negative.    Blood pressure 115/47, pulse 54, temperature 97.6 F (36.4 C), temperature source Oral, resp. rate 18, height 5\' 9"  (1.753 m), weight 62.6 kg (138 lb 0.1 oz), SpO2 100.00%. Physical Exam  Constitutional: He is oriented to person, place, and time.       Thin chronically ill appearing AA male.  Fatigues quickly with exam and needs assistance for basic mobility including rolling in bed.   HENT:  Head: Normocephalic and atraumatic.  Mouth/Throat: Oropharynx is clear and moist.  Eyes: Pupils are equal, round, and reactive to light.  Neck: Neck supple. No JVD present. No tracheal deviation present.  Cardiovascular: Normal rate, regular rhythm, normal heart sounds and intact distal pulses.  Exam reveals no gallop and no friction rub.   No murmur heard. Respiratory: Effort normal and breath sounds normal. No stridor. No respiratory distress. He has no wheezes. He has no rales. He exhibits no tenderness.  GI: Soft. Bowel sounds are normal. He exhibits no distension and no mass. There is  no tenderness. There is no guarding.  Musculoskeletal:       Right BKA dressings intact. Pt diffusely weak with activity, but no focal deficit noted  Neurological: He is alert and oriented to person, place, and time.  Skin:       sacral area with a small open area of the region centrally  and multiple other scattered open areas through a very large area of induration measuring at least 8cm x 10cm. He has been placed on a pressure relief mattress.   Psychiatric: He has a normal mood and affect. His behavior is normal. Judgment and thought content normal.    Assessment/Plan: Sacral decubitis, unstageable, and left ischial pressure ulcer as well Poor mobility/ severe deconditioning Severe protein calorie malnutrition Multiple medical problems as noted.  He will require debridement of his sacral ulcer and possibly negative pressure wound therapy. Agree with pressure relief mattress, nutritional support, diabetes management as you are doing.  Would add Vitamin C and E if the pt can tolerate.  Also recommend checking pre-albumin. Will discuss timing of OR with Dr. Kelly Splinter.   RAYBURN,SHAWN,PA-C Plastic Surgery [phone]  I have seen and done a history and physical exam on the patient.  He is very pleasant.  The wounds can be managed on an outpatient status. Continue off loading with air mattress bed.  [employer], DO

## 2012-05-20 NOTE — Progress Notes (Signed)
Rehab admissions - Evaluated for possible admission.  Patient would like to return to inpatient rehab since he has had an amputation.  I will need an OT eval today and updated PT notes from today so that I can get authorization from [employer] for inpatient rehab re-admission.  Call me for questions.  #[phone]

## 2012-05-20 NOTE — Progress Notes (Signed)
Patient discussed with Roderic Palau- [facility] RN Liason. As noted below- will proceed with insurance authorization for patient to return to [facility]- pending OT and PT updated notes.  CSW will continue to monitor and assist as needed.  [facility] number requested today in case SNF is indicated.    Lorri Frederick. [place]  [phone]

## 2012-05-20 NOTE — Evaluation (Signed)
Occupational Therapy Evaluation Patient Details Name: [name] MRN: [id number] DOB: 1929-09-02 Today's Date: 05/20/2012 Time: 5784-6962 OT Time Calculation (min): 38 min  OT Assessment / Plan / Recommendation Clinical Impression  78 yo male admitted from [facility] for Rt BKA. Pt could benefit from skilled OT acutely    OT Assessment  Patient needs continued OT Services    Follow Up Recommendations  Inpatient Rehab    Barriers to Discharge      Equipment Recommendations  Defer to next venue    Recommendations for Other Services Rehab consult  Frequency  Min 2X/week    Precautions / Restrictions Precautions Precautions: Fall Restrictions RLE Weight Bearing: Non weight bearing   Pertinent Vitals/Pain None reported    ADL  Eating/Feeding: Simulated;Set up (provided red foam due to pt with built up utensils ) Where Assessed - Eating/Feeding: Chair Grooming: Performed;Wash/dry face;Minimal assistance (due to forward flexed posture) Where Assessed - Grooming: Supported sitting Upper Body Bathing: Performed;Chest;Right arm;Left arm;Abdomen;Minimal assistance Where Assessed - Upper Body Bathing: Supported sitting Lower Body Bathing: Performed;Moderate assistance Where Assessed - Lower Body Bathing: Supported sitting Upper Body Dressing: Performed;Min guard Where Assessed - Upper Body Dressing: Supported sitting Toilet Transfer: Simulated;Maximal Dentist Method: Stand pivot Acupuncturist: Raised toilet seat with arms (or 3-in-1 over toilet) (transfer to the Lt side) Equipment Used: Gait belt Transfers/Ambulation Related to ADLs: Pt completed stand pivot to the Left side with difficulty with full upright posture required facilitation for hip extension. LT LE blocked and (A)ed with turning to chair ADL Comments: PT agreeable to OT evaluation. pt with condom cath leaking on arrival. pt agreeable to bath at EOB , doff gown, redon new gown and repositioned  in chair. Pt educated on knee flexion and extension in addition to glute squeezes. Pt provided spirometer to complete breathing x5 with mod v/c. Pt demonstrates BIL hand deficits with grasp. Pt required (A) to hold spirometer correctly     OT Diagnosis: Generalized weakness  OT Problem List: Decreased strength;Decreased range of motion;Decreased activity tolerance;Impaired balance (sitting and/or standing);Decreased safety awareness;Decreased knowledge of use of DME or AE;Decreased knowledge of precautions;Pain OT Treatment Interventions: Self-care/ADL training;Patient/family education;Balance training;DME and/or AE instruction;Therapeutic activities;Therapeutic exercise;Energy conservation   OT Goals Acute Rehab OT Goals OT Goal Formulation: With patient Time For Goal Achievement: 06/03/12 Potential to Achieve Goals: Good ADL Goals Pt Will Perform Grooming: with supervision;Sitting, chair;Unsupported ADL Goal: Grooming - Progress: Goal set today Pt Will Transfer to Toilet: with mod assist;Stand pivot transfer;3-in-1;with DME ADL Goal: Toilet Transfer - Progress: Goal set today Miscellaneous OT Goals Miscellaneous OT Goal #1: Pt will complete tactile input to Rt residual limb independent to decrease phatom pain and help with shaping of the limb OT Goal: Miscellaneous Goal #1 - Progress: Goal set today Miscellaneous OT Goal #2: Pt will demonstrate Rt LE residual limb wrapping to assist with limb shaping as precursor to orthothotic fit OT Goal: Miscellaneous Goal #2 - Progress: Goal set today  Visit Information  Last OT Received On: 05/20/12 Assistance Needed: +2    Subjective Data  Subjective: "Help me sit up" Patient Stated Goal: to sit up   Prior Functioning  Vision/Perception  Home Living Lives With: Spouse Available Help at Discharge: Family Type of Home: House Home Access: Stairs to enter Secretary/administrator of Steps: 2 Entrance Stairs-Rails: Right;Left;Can reach  both Home Layout: One level Bathroom Shower/Tub: Engineer, manufacturing systems: Standard Bathroom Accessibility: Yes How Accessible: Accessible via walker Home Adaptive Equipment: Dan Humphreys -  rolling;Tub transfer bench;Bedside commode/3-in-1 Prior Function Level of Independence: Independent with assistive device(s) Able to Take Stairs?: Yes Driving: No Vocation: Retired Musician: No difficulties Dominant Hand: Right      Cognition  Overall Cognitive Status: Appears within functional limits for tasks assessed/performed Arousal/Alertness: Awake/alert Orientation Level: Appears intact for tasks assessed Behavior During Session: [facility] for tasks performed Cognition - Other Comments: Pt with cueing to use spirometer though reports knowing how to use it. Pt no awareness of void of bladder but when questioning cues verbalized that gown was wet    Extremity/Trunk Assessment Right Upper Extremity Assessment RUE ROM/Strength/Tone: Within functional levels RUE Coordination: WFL - gross motor (deficits with fine motor) Left Upper Extremity Assessment LUE ROM/Strength/Tone: Within functional levels LUE Coordination: WFL - gross motor;Deficits (fine motor) Trunk Assessment Trunk Assessment: Normal   Mobility  Shoulder Instructions  Bed Mobility Rolling Right: 4: Min guard;With rail Right Sidelying to Sit: 3: Mod assist;With rails;HOB elevated (HOB 30) Supine to Sit: 3: Mod assist;With rails Sitting - Scoot to Edge of Bed: 2: Max assist Details for Bed Mobility Assistance: pt with mod v/c for sequence task, and upright posture. Pt requires BIL UE to balance at EOB Transfers Sit to Stand: 2: Max assist;With upper extremity assist;From bed;From elevated surface Stand to Sit: 2: Max assist;With upper extremity assist;To elevated surface;To chair/3-in-1 Details for Transfer Assistance: Pt with facilitation for upright posture       Exercise     Balance Static Sitting  Balance Static Sitting - Balance Support: Bilateral upper extremity supported;Feet supported Static Sitting - Level of Assistance: 3: Mod assist Static Sitting - Comment/# of Minutes: ~10 minutes   End of Session OT - End of Session Activity Tolerance: Patient tolerated treatment well Patient left: in chair;with call bell/phone within reach (Rt LE extended on pillow) Nurse Communication: Mobility status;Precautions;Weight bearing status  GO     [facility] 05/20/2012, 12:14 PM Pager: [phone]

## 2012-05-20 NOTE — Progress Notes (Signed)
Patient ID: [name], male   DOB: 11-20-1928, 78 y.o.   MRN: [id number] Postoperative day 3 right transtibial amputation. Patient has no complaints of pain dressing is clean and dry. Patient does have a sacral decubitus ulcer which is chronic.  I am consulted Dr. Wayland Denis to evaluate the sacral decubitus ulcer. Patient is currently undergoing wound care treatment for this.  The F. L2 was completed for discharge to skilled nursing facility. The evaluation for inpatient rehabilitation is underway.

## 2012-05-21 LAB — GLUCOSE, CAPILLARY
Glucose-Capillary: 128 mg/dL — ABNORMAL HIGH (ref 70–99)
Glucose-Capillary: 211 mg/dL — ABNORMAL HIGH (ref 70–99)

## 2012-05-21 MED ORDER — LEVOTHYROXINE SODIUM 75 MCG PO TABS: 75.0000 ug | ORAL_TABLET | Freq: Every day | ORAL | Status: DC

## 2012-05-21 MED ORDER — IPRATROPIUM BROMIDE 0.02 % IN SOLN: 0.5000 mg | RESPIRATORY_TRACT | Status: DC | PRN

## 2012-05-21 NOTE — Discharge Summary (Signed)
Physician Discharge Summary  Patient ID: [name] MRN: [id number] DOB/AGE: 76-Jan-1930 76 y.o.  Admit date: 05/16/2012 Discharge date: 05/21/2012  Admission Diagnoses: Osteomyelitis and ulceration right ankle  Discharge Diagnoses: Same Active Problems:  * No active hospital problems. *    Discharged Condition: stable  Hospital Course: Patient's hospital course was essentially unremarkable. He underwent a right transtibial amputation and was felt to be safe for discharge to inpatient rehabilitation. Patient received a plastic surgery consult for has sacral decubitus ulcer.  Consults: Plastic surgery consult for sacral decubitus ulcer.  Significant Diagnostic Studies: labs: Routine labs  Treatments: surgery: Right transtibial amputation.  Discharge Exam: Blood pressure 110/45, pulse 84, temperature 99 F (37.2 C), temperature source Oral, resp. rate 16, height 5\' 9"  (1.753 m), weight 62.6 kg (138 lb 0.1 oz), SpO2 100.00%. Incision/Wound: dressing clean dry and intact at time of discharge.  Disposition: 02-Short Term Hospital     Medication List     As of 05/21/2012  6:24 AM    ASK your doctor about these medications         albuterol (5 MG/ML) 0.5% nebulizer solution   Commonly known as: PROVENTIL   Take 0.5 mLs (2.5 mg total) by nebulization every 2 (two) hours as needed for wheezing or shortness of breath.      aspirin 81 MG chewable tablet   Chew 1 tablet (81 mg total) by mouth daily.      feeding supplement (OXEPA) Liqd   Place 1,000 mLs into feeding tube continuous.      feeding supplement Liqd   Take 30 mLs by mouth 2 (two) times daily with breakfast and lunch.      ferrous sulfate 300 (60 FE) MG/5ML syrup   Place 5 mLs (300 mg total) into feeding tube 3 (three) times daily.      insulin aspart 100 UNIT/ML injection   Commonly known as: novoLOG   Inject 0-20 Units into the skin every 4 (four) hours.      insulin glargine 100 UNIT/ML injection   Commonly  known as: LANTUS   Inject 15 Units into the skin at bedtime.      ipratropium 0.02 % nebulizer solution   Commonly known as: ATROVENT   Take 0.5 mg by nebulization every 2 (two) hours as needed. For shortness of breath      levothyroxine 75 MCG tablet   Commonly known as: SYNTHROID, LEVOTHROID   Take 75 mcg by mouth daily.      metoprolol tartrate 25 mg/10 mL Susp   Commonly known as: LOPRESSOR   Place 5 mLs (12.5 mg total) into feeding tube 2 (two) times daily.      ranitidine 150 MG/10ML syrup   Commonly known as: ZANTAC   Take 10 mLs (150 mg total) by mouth 2 (two) times daily.      sodium chloride 0.45 % solution   Inject 20 mLs into the vein continuous.           Follow-up Information    Follow up with DUDA,MARCUS V, MD. In 2 weeks.   Contact information:   [address] [place] [address] [phone]          Signed: Nadara Mustard 05/21/2012, 6:24 AM

## 2012-05-21 NOTE — Progress Notes (Signed)
Utilization review completed. Cem Kosman, RN, BSN. 

## 2012-05-21 NOTE — Progress Notes (Signed)
Rehab admissions - I spoke with Dr. Riley Kill and rehab team this am.  Dr. Riley Kill feels that patient will now need SNF level therapies with his new amputation.  Wife and Dtr will not be able to manage at home even after an additional inpatient rehab stay.  I have informed Child psychotherapist of the need for a SNF bed.  I am very sorry about the confusion with this case.  Call me for questions.  #[phone]

## 2012-05-21 NOTE — Progress Notes (Signed)
Patient ID: [name], male   DOB: 1929/05/31, 78 y.o.   MRN: [id number] Both physical therapy and occupational therapy have recommended return to inpatient rehabilitation. Patient is safe for discharge to inpatient rehabilitation once this is approved by her insurance. Discharge summary dictated.

## 2012-05-22 LAB — GLUCOSE, CAPILLARY
Glucose-Capillary: 151 mg/dL — ABNORMAL HIGH (ref 70–99)
Glucose-Capillary: 464 mg/dL — ABNORMAL HIGH (ref 70–99)
Glucose-Capillary: 34 mg/dL — CL (ref 70–99)

## 2012-05-22 MED ORDER — INSULIN ASPART 100 UNIT/ML ~~LOC~~ SOLN
12.0000 [IU] | Freq: Once | SUBCUTANEOUS | Status: AC
  Administered 2012-05-22: 12 [IU] via SUBCUTANEOUS

## 2012-05-22 MED ORDER — DEXTROSE 50 % IV SOLN
INTRAVENOUS | Status: AC
  Filled 2012-05-22: qty 50

## 2012-05-22 MED ORDER — DEXTROSE 50 % IV SOLN
50.0000 mL | Freq: Once | INTRAVENOUS | Status: AC | PRN
  Administered 2012-05-22: 50 mL via INTRAVENOUS

## 2012-05-22 NOTE — Progress Notes (Signed)
Patient ID: [name], male   DOB: 1929/05/02, 78 y.o.   MRN: [id number] Patient was initially scheduled for discharge to skilled nursing rehabilitation after his transtibial amputation. Inpatient rehabilitation stated that they wanted to take the patient back so patient's discharge to skilled nursing facility was discontinued for patient's anticipated discharge to inpatient rehabilitation. As of yesterday inpatient rehabilitation states that they do not feel that he would be a good candidate for inpatient rehabilitation. There has been no change then patient's medical status since the initial evaluation for discharge to inpatient rehabilitation. We will begin the process again of discharge to skilled nursing. This has caused a delay in the patient's discharge from the acute hospital.

## 2012-05-22 NOTE — Progress Notes (Signed)
Physical Therapy Treatment Patient Details Name: [name] MRN: [id number] DOB: 1929/08/01 Today's Date: 05/22/2012 Time: 7829-5621 PT Time Calculation (min): 24 min  PT Assessment / Plan / Recommendation Comments on Treatment Session  Pt admitted from [facility] with new right BKA and continues to progress with therapy.  Pt agreeable to bilateral LE therapeutic exercises, but declines sit to stand trials due to fatigue.  Pt just finishing with OOB to chair with RN staff.    Follow Up Recommendations  Skilled nursing facility    Barriers to Discharge        Equipment Recommendations  Defer to next venue    Recommendations for Other Services    Frequency Min 3X/week   Plan Frequency remains appropriate;Discharge plan needs to be updated    Precautions / Restrictions Precautions Precautions: Fall Restrictions Weight Bearing Restrictions: Yes RLE Weight Bearing: Non weight bearing   Pertinent Vitals/Pain None    Mobility  Bed Mobility Bed Mobility: Not assessed Transfers Transfers: Not assessed (Declined.) Ambulation/Gait Ambulation/Gait Assistance: Not tested (comment) Stairs: No Wheelchair Mobility Wheelchair Mobility: No    Exercises General Exercises - Lower Extremity Ankle Circles/Pumps: AROM;Left;10 reps;Supine Heel Slides: AAROM;Left;10 reps;Supine Amputee Exercises Quad Sets: AROM;Both;10 reps;Supine Hip ABduction/ADduction: AAROM;Both;10 reps;Supine Knee Flexion: AAROM;Both;10 reps;Supine Straight Leg Raises: AAROM;Both;10 reps;Supine   PT Diagnosis:    PT Problem List:   PT Treatment Interventions:     PT Goals Acute Rehab PT Goals PT Goal Formulation: With patient Time For Goal Achievement: 05/31/12 Potential to Achieve Goals: Good PT Goal: Perform Home Exercise Program - Progress: Progressing toward goal  Visit Information  Last PT Received On: 05/22/12 Assistance Needed: +2    Subjective Data  Subjective: "I just don't think I can do anything  right now." Patient Stated Goal: To be able to do more   Cognition  Overall Cognitive Status: Appears within functional limits for tasks assessed/performed Arousal/Alertness: Awake/alert Orientation Level: Appears intact for tasks assessed Behavior During Session: [facility] for tasks performed    Balance  Balance Balance Assessed: No  End of Session PT - End of Session Activity Tolerance: Patient limited by fatigue Patient left: in chair;with call bell/phone within reach Nurse Communication: Mobility status   GP     Cephus Shelling 05/22/2012, 2:24 PM  05/22/2012 Cephus Shelling, PT, DPT [phone]

## 2012-05-22 NOTE — Progress Notes (Signed)
Received a messagel from [facility]- Admissions at [facility]. She has made a bed offer on patient and this is facility of choice per patient and daughter.  Attempted to call her back to make sure that the facility accepts pt's insurance- of [place].  SNF must have authorization through [facility] and want to see PT notes for tomorrow.  CSW to folllow up in the a.m.  Lorri Frederick. [place]  [phone]

## 2012-05-22 NOTE — Progress Notes (Signed)
Clinical Social Work  Insurance claims handler to [facility] for authorization. Representative Steward Drone) reported that PT notes from 05/22/12 did not state he met criteria for SNF due to him not participating. [place] requests additional PT notes be sent on 05/23/12 for review before authorization will be given.  Unk Lightning, LCSW  (Coverage for [facility]liche)

## 2012-05-22 NOTE — Progress Notes (Signed)
Hypoglycemic Event  CBG: 28   Treatment: D50 IV 50 mL  Symptoms: None  Follow-up CBG: Time 1718 CBG Result:112  Possible Reasons for Event: inadequate meal intake  Comments/MD notified:  MD notified @1655 , D50IV 50ml given at 1658.     [name]  Remember to initiate Hypoglycemia Order Set & complete

## 2012-05-22 NOTE — Progress Notes (Signed)
Spoke with [name]- CIR about patient who stated that CIR will not be able to accept patient back due to increased medical needs.   Spoke with pt and his daughter [name] regarding above and SNF search continues. Daughter states she will tour area facilities.  Bed search is in place. Patient is also being evaluated by Plastic Surgery regarding wound care issues.  [name]. [name]  [phone]

## 2012-05-23 LAB — GLUCOSE, CAPILLARY: Glucose-Capillary: 171 mg/dL — ABNORMAL HIGH (ref 70–99)

## 2012-05-23 MED ORDER — OXYCODONE-ACETAMINOPHEN 5-325 MG PO TABS: 1.0000 | ORAL_TABLET | ORAL | Status: DC | PRN

## 2012-05-23 MED ORDER — HYDROCODONE-ACETAMINOPHEN 5-500 MG PO TABS: 1.0000 | ORAL_TABLET | Freq: Four times a day (QID) | ORAL | Status: DC | PRN

## 2012-05-23 NOTE — Progress Notes (Signed)
Physical Therapy Treatment Patient Details Name: [name] MRN: [id number] DOB: 13-Feb-1929 Today's Date: 05/23/2012 Time: 1010-1041 PT Time Calculation (min): 31 min  PT Assessment / Plan / Recommendation Comments on Treatment Session  Pt participating in therapy.  Pt continues to be limited by generalized weakness with emphasis on L LE (quadraceps) weakness.  Pt will benefit from continued PT in SNF setting secondary to need for skilled assistance of more than one person.      Follow Up Recommendations  Skilled nursing facility    Barriers to Discharge        Equipment Recommendations  Defer to next venue    Recommendations for Other Services    Frequency Min 3X/week   Plan Frequency remains appropriate;Discharge plan needs to be updated    Precautions / Restrictions Precautions Precautions: Fall Restrictions Weight Bearing Restrictions: Yes RLE Weight Bearing: Non weight bearing   Pertinent Vitals/Pain No c/o pain    Mobility  Bed Mobility Bed Mobility: Sit to Supine;Supine to Sit;Rolling Left Rolling Right: Not tested (comment) Rolling Left: 4: Min assist;With rail Right Sidelying to Sit: Not tested (comment) Supine to Sit: 3: Mod assist;With rails Supine to Sit: Patient Percentage: 50% Sitting - Scoot to Edge of Bed: 2: Max assist Sit to Supine: 5: Supervision;HOB flat;With rail Sit to Sidelying Right: Not Tested (comment) Transfers Transfers: Sit to Stand;Stand to Sit;Stand Pivot Transfers;Squat Pivot Transfers Sit to Stand: 2: Max assist;With upper extremity assist;From elevated surface;From bed Sit to Stand: Patient Percentage: 40% Stand to Sit: 2: Max assist;With upper extremity assist;To elevated surface;To chair/3-in-1 Stand to Sit: Patient Percentage: 30% Stand Pivot Transfers: 1: +2 Total assist Stand Pivot Transfers: Patient Percentage: 30% Squat Pivot Transfers: 1: +2 Total assist Squat Pivot Transfers: Patient Percentage: 30% Details for Transfer  Assistance: Max assist to initiate standing from bed in high position, total assist to initiate standing from bed side commode. Manual facilitation, verbal and tactile cues to transition wt forward and extend L knee/hip. Pt present with significant weakness in L LE unable to support his weight (knee buckling) within a few seconds of standing. Pt given verbal cue to lock his knee.  This helped pt maintain standing position. Total asssist to pivot pt to bed/3 in1  using RW.   Ambulation/Gait Ambulation/Gait Assistance: Not tested (comment) Wheelchair Mobility Wheelchair Mobility: No    Exercises     PT Diagnosis:    PT Problem List:   PT Treatment Interventions:     PT Goals Acute Rehab PT Goals PT Goal Formulation: With patient Time For Goal Achievement: 05/31/12 Potential to Achieve Goals: Fair Pt will go Supine/Side to Sit: with supervision PT Goal: Supine/Side to Sit - Progress: Progressing toward goal Pt will go Sit to Supine/Side: with supervision PT Goal: Sit to Supine/Side - Progress: Met Pt will go Sit to Stand: with supervision PT Goal: Sit to Stand - Progress: Progressing toward goal Pt will go Stand to Sit: with supervision PT Goal: Stand to Sit - Progress: Progressing toward goal Pt will Ambulate: 1 - 15 feet;with supervision;with least restrictive assistive device PT Goal: Ambulate - Progress: Discontinued (comment) (No longer appropriate goal for this pt. ) Pt will Perform Home Exercise Program: with supervision, verbal cues required/provided Pt will Propel Wheelchair: 10 - 50 feet;with min assist PT Goal: Propel Wheelchair - Progress: Goal set today  Visit Information  Last PT Received On: 05/23/12 Assistance Needed: +2    Subjective Data  Subjective: Agreeable to OOB with PT Patient Stated Goal:  To be able to do more   Cognition  Overall Cognitive Status: Appears within functional limits for tasks assessed/performed Arousal/Alertness: Awake/alert Orientation  Level: Appears intact for tasks assessed Behavior During Session: [facility] for tasks performed    Balance  Balance Balance Assessed: Yes Static Sitting Balance Static Sitting - Balance Support: Bilateral upper extremity supported;Feet supported Static Sitting - Level of Assistance: 3: Mod assist Static Sitting - Comment/# of Minutes: Pt presents with posterior lean in sitiing on EOB.  Pt holding onto walker to prevent falling back into bed.    End of Session PT - End of Session Equipment Utilized During Treatment: Gait belt Activity Tolerance: Patient limited by fatigue Patient left: in bed;with call bell/phone within reach;with bed alarm set Nurse Communication: Mobility status   GP     Van Seymore 05/23/2012, 10:56 AM Theron Arista L. Jabori Henegar DPT [phone]

## 2012-05-23 NOTE — Progress Notes (Signed)
Patient discharged to [facility] nursing home. Transported by [facility].

## 2012-05-23 NOTE — Progress Notes (Signed)
Blood glucose was 171 this morning at 7am

## 2012-05-23 NOTE — Progress Notes (Signed)
Patient ID: [name], male   DOB: [birth year], 78 y.o.   MRN: [id number] Anticipate discharge to skilled nursing facility today. F. L2 signed. Prescriptions on the chart for Vicodin and Percocet. Followup in the office in 2 weeks.

## 2012-05-24 LAB — GLUCOSE, CAPILLARY: Glucose-Capillary: 70 mg/dL (ref 70–99)

## 2012-05-26 ENCOUNTER — Emergency Department (HOSPITAL_COMMUNITY)
Admission: EM | Admit: 2012-05-26 | Discharge: 2012-05-27 | Disposition: A | Discharge status: Skilled Nursing Facility | Payer: Medicare Other | Attending: Emergency Medicine | Admitting: Emergency Medicine

## 2012-05-26 ENCOUNTER — Encounter (HOSPITAL_COMMUNITY): Payer: Self-pay | Admitting: Emergency Medicine

## 2012-05-26 DIAGNOSIS — Z859 Personal history of malignant neoplasm, unspecified: Secondary | ICD-10-CM | POA: Insufficient documentation

## 2012-05-26 DIAGNOSIS — Y9210 Unspecified residential institution as the place of occurrence of the external cause: Secondary | ICD-10-CM | POA: Insufficient documentation

## 2012-05-26 DIAGNOSIS — Z049 Encounter for examination and observation for unspecified reason: Secondary | ICD-10-CM | POA: Insufficient documentation

## 2012-05-26 DIAGNOSIS — Z87891 Personal history of nicotine dependence: Secondary | ICD-10-CM | POA: Insufficient documentation

## 2012-05-26 DIAGNOSIS — Z7982 Long term (current) use of aspirin: Secondary | ICD-10-CM | POA: Insufficient documentation

## 2012-05-26 DIAGNOSIS — Z881 Allergy status to other antibiotic agents status: Secondary | ICD-10-CM | POA: Insufficient documentation

## 2012-05-26 DIAGNOSIS — Z794 Long term (current) use of insulin: Secondary | ICD-10-CM | POA: Insufficient documentation

## 2012-05-26 DIAGNOSIS — E11319 Type 2 diabetes mellitus with unspecified diabetic retinopathy without macular edema: Secondary | ICD-10-CM | POA: Insufficient documentation

## 2012-05-26 DIAGNOSIS — W06XXXA Fall from bed, initial encounter: Secondary | ICD-10-CM | POA: Insufficient documentation

## 2012-05-26 DIAGNOSIS — Z8249 Family history of ischemic heart disease and other diseases of the circulatory system: Secondary | ICD-10-CM | POA: Insufficient documentation

## 2012-05-26 DIAGNOSIS — W19XXXA Unspecified fall, initial encounter: Secondary | ICD-10-CM

## 2012-05-26 DIAGNOSIS — E1139 Type 2 diabetes mellitus with other diabetic ophthalmic complication: Secondary | ICD-10-CM | POA: Insufficient documentation

## 2012-05-26 DIAGNOSIS — E1149 Type 2 diabetes mellitus with other diabetic neurological complication: Secondary | ICD-10-CM | POA: Insufficient documentation

## 2012-05-26 DIAGNOSIS — E1142 Type 2 diabetes mellitus with diabetic polyneuropathy: Secondary | ICD-10-CM | POA: Insufficient documentation

## 2012-05-26 LAB — GLUCOSE, CAPILLARY: Glucose-Capillary: 88 mg/dL (ref 70–99)

## 2012-05-26 NOTE — ED Provider Notes (Signed)
History     CSN: [id number]  Arrival date & time 05/26/12  2127   First MD Initiated Contact with Patient 05/26/12 2206      Chief Complaint  Patient presents with  . Fall    (Consider location/radiation/quality/duration/timing/severity/associated sxs/prior treatment) Patient is a 78 y.o. male presenting with fall. The history is provided by a relative.  Fall Pertinent negatives include no abdominal pain, no vomiting and no headaches.   78 year old, male, who is bed ridden and lives at a nursing home fell out of bed onto the floor.  Apparently, he hit his head and was on the floor.  For approximately 20 minutes before.  The staff came to pick him up and put him back in bed.  His daughter brought him over here for evaluation.  He denies a headache.  He denies loss of consciousness.  He has no pain anywhere.  His daughter is extremely upset with the care that he is getting in a nursing home and she wants him admitted to the hospital for placement.  The patient is asymptomatic and denies recent illness.  Past Medical History  Diagnosis Date  . Diabetes mellitus   . Cancer   . Thyroid disease   . Anemia   . Hyperlipidemia   . Neuropathy, diabetic   . Diabetic retinopathy     Past Surgical History  Procedure Date  . Cataract extraction, bilateral   . Colonoscopy w/ polypectomy   . Esophagogastroduodenoscopy 04/25/2012    Procedure: ESOPHAGOGASTRODUODENOSCOPY (EGD);  Surgeon: Meryl Dare, MD,FACG;  Location: [facility];  Service: Endoscopy;  Laterality: N/A;  . Amputation 05/16/2012    Procedure: AMPUTATION BELOW KNEE;  Surgeon: Nadara Mustard, MD;  Location: [facility] OR;  Service: Orthopedics;  Laterality: Right;  Right Below Knee Amputation    Family History  Problem Relation Age of Onset  . Hypertension      History  Substance Use Topics  . Smoking status: Former Smoker -- 0.2 packs/day    Types: Cigarettes    Quit date: 11/22/2006  . Smokeless tobacco: Never Used  .  Alcohol Use: No      Review of Systems  HENT: Negative for neck pain.   Eyes: Negative for visual disturbance.  Cardiovascular: Negative for chest pain.  Gastrointestinal: Negative for vomiting and abdominal pain.  Musculoskeletal: Negative for back pain.  Skin: Negative for wound.  Neurological: Negative for weakness and headaches.  Hematological: Does not bruise/bleed easily.  Psychiatric/Behavioral: Negative for confusion.    Allergies  Bactrim and Doxycycline  Home Medications   Current Outpatient Rx  Name Route Sig Dispense Refill  . ALBUTEROL SULFATE (2.5 MG/3ML) 0.083% IN NEBU Nebulization Take 2.5 mg by nebulization every 2 (two) hours as needed. For wheezing/shortness of breath    . ASPIRIN 81 MG PO CHEW Oral Chew 1 tablet (81 mg total) by mouth daily.    Marland Kitchen FERROUS SULFATE 325 (65 FE) MG PO TABS Oral Take 325 mg by mouth daily with breakfast.    . INSULIN ASPART 100 UNIT/ML [place] SOLN Subcutaneous Inject 0-20 Units into the skin 4 (four) times daily -  with meals and at bedtime. Per sliding scale    . IPRATROPIUM BROMIDE 0.02 % IN SOLN Nebulization Take 0.5 mg by nebulization every 2 (two) hours as needed. For shortness of breath    . LEVOTHYROXINE SODIUM 75 MCG PO TABS Oral Take 75 mcg by mouth daily.    Marland Kitchen METOPROLOL TARTRATE 25 MG PO TABS Oral Take  12.5 mg by mouth 2 (two) times daily.    Marland Kitchen RANITIDINE HCL 150 MG PO TABS Oral Take 150 mg by mouth 2 (two) times daily.      BP 98/42  Pulse 81  Temp 98.4 F (36.9 C) (Oral)  Resp 16  Ht 6' (1.829 m)  Wt 135 lb (61.236 kg)  BMI 18.31 kg/m2  SpO2 94%  Physical Exam  Nursing note and vitals reviewed. Constitutional: He is oriented to person, place, and time. He appears well-developed and well-nourished. No distress.  HENT:  Head: Normocephalic and atraumatic.  Eyes: Conjunctivae normal and EOM are normal.  Neck: Normal range of motion. Neck supple.       No neck tenderness  Cardiovascular: Normal rate and regular  rhythm.   No murmur heard. Pulmonary/Chest: Effort normal and breath sounds normal. He exhibits no tenderness.  Abdominal: Soft. He exhibits no distension.  Musculoskeletal: Normal range of motion.       Right lower leg amputation  Neurological: He is alert and oriented to person, place, and time.  Skin: Skin is warm and dry.  Psychiatric: He has a normal mood and affect. Thought content normal.    ED Course  Procedures (including critical care time)   Labs Reviewed  GLUCOSE, CAPILLARY   No results found.   No diagnosis found.    MDM  Fall- no indjury No illness        Cheri Guppy, MD 05/26/12 2355

## 2012-05-26 NOTE — ED Notes (Signed)
The patient is a resident at [facility] and [address].  He was attempting to adjust his position in bed without assistance when he fell out of bed.  The patient states that he bumped his head and right elbow, but he says that he is not experiencing any pain in these areas.  The patient was brought to [facility] ER per the request of the patient's family to "check and make sure he is okay."  EMS noted the patient's pupils were constricted, but they are PERRLA.

## 2012-05-27 ENCOUNTER — Emergency Department (HOSPITAL_COMMUNITY): Payer: Medicare Other

## 2012-05-27 ENCOUNTER — Encounter (HOSPITAL_COMMUNITY): Payer: Self-pay | Admitting: Radiology

## 2012-05-27 NOTE — ED Notes (Addendum)
Spoke with Dr Weldon Inches about  pts BP; pt denies feeling lightheaded/dizzy; EDP okay with pt being discharged back to [facility]

## 2012-06-06 ENCOUNTER — Encounter (HOSPITAL_BASED_OUTPATIENT_CLINIC_OR_DEPARTMENT_OTHER): Payer: Medicare Other | Attending: Internal Medicine

## 2012-06-06 DIAGNOSIS — I739 Peripheral vascular disease, unspecified: Secondary | ICD-10-CM | POA: Insufficient documentation

## 2012-06-06 DIAGNOSIS — M869 Osteomyelitis, unspecified: Secondary | ICD-10-CM | POA: Insufficient documentation

## 2012-06-06 DIAGNOSIS — E1169 Type 2 diabetes mellitus with other specified complication: Secondary | ICD-10-CM | POA: Insufficient documentation

## 2012-06-06 DIAGNOSIS — L98499 Non-pressure chronic ulcer of skin of other sites with unspecified severity: Secondary | ICD-10-CM | POA: Insufficient documentation

## 2012-06-06 DIAGNOSIS — L97309 Non-pressure chronic ulcer of unspecified ankle with unspecified severity: Secondary | ICD-10-CM | POA: Insufficient documentation

## 2012-06-06 DIAGNOSIS — S88119A Complete traumatic amputation at level between knee and ankle, unspecified lower leg, initial encounter: Secondary | ICD-10-CM | POA: Insufficient documentation

## 2012-06-06 DIAGNOSIS — L97809 Non-pressure chronic ulcer of other part of unspecified lower leg with unspecified severity: Secondary | ICD-10-CM | POA: Insufficient documentation

## 2012-06-06 DIAGNOSIS — M9080 Osteopathy in diseases classified elsewhere, unspecified site: Secondary | ICD-10-CM | POA: Insufficient documentation

## 2012-06-06 NOTE — Progress Notes (Signed)
[facility]  NAME:  [name], [name] NO.:  [id number]  MEDICAL RECORD NO.:  [id number]      DATE OF BIRTH:  [birth year]  PHYSICIAN:  Maxwell Caul, M.D.      VISIT DATE:                                  OFFICE VISIT   HISTORY:  Ms. Jezewski is a gentleman I have actually seen once before at [facility] where he is currently a resident at the SNF level.  He is an 78 year old man with multiple medical issues.  He was admitted on April 04, 2012, sepsis, CHF, and a non-ST elevation MI.  He was critically ill, and remained on a ventilator.  Sometime in this time frame, he developed a Wagner's grade 3 ulcer of his right lateral malleolus with osteomyelitis of the fibula.  He ultimately required a BKA.  He was also noted to have significant ulceration of the sacral area with small open areas, and multiple other scattered open areas throughout a very large area of induration on his right buttock and coccyx area and smaller areas on the left.  This is well described by a consult with Dr. Kelly Splinter in [place].  When I saw this gentleman in the SNF 2 weeks ago, this area was an indurated erythematous area involving the right lateral buttock. Predominantly, there was purulent-looking drainage here.  Smaller punctate areas on the left buttock were also noted.  I did a culture of this.  Starting him on antibiotics; however, I am not exactly sure of the culture results at this point.  In any case, he has apparently remained on longstanding Septra,, and he returns to see [place] in followup of this area.  PAST MEDICAL HISTORY:  Right BKA as described above, sacral ulceration, hypothyroidism, diabetes, protein-calorie malnutrition, hyperlipidemia.  PHYSICAL EXAMINATION:  The area over the sacrum measures 7.1 x 2.4 x 0.3.  Once again, this is an odd-looking area with thick adherent eschar over an irregular wound area.  The induration that surrounded this last time I saw  is much better.  There is no purulent drainage.  There were smaller open areas on his left buttock and also more laterally on the right buttock.  I did not add anything to the culture here.  IMPRESSION:  Very irregular looking area of ulceration.  I attempted debridement here with a curette; however, I was not able to remove much of the overlying eschar.  I had previously wondered if this was really a pressure area that became infected or could represent an alternative diagnosis like hidradenitis.  I could not easily debride this area.  I phoned, the facility spoke to the wound care nurse there.  I have recommending continuing the silver alginate and the Septra for at least another 2 weeks.  As mentioned, the induration here seems a lot better. I will try to track down the culture results that were done roughly 2 weeks ago.  We will see him back in the clinic in 2 week's time.         ______________________________ Maxwell Caul, M.D.    MGR/MEDQ  D:  06/06/2012  T:  06/06/2012  Job:  [id number]

## 2012-07-05 ENCOUNTER — Other Ambulatory Visit: Payer: Self-pay

## 2012-07-05 DIAGNOSIS — L97409 Non-pressure chronic ulcer of unspecified heel and midfoot with unspecified severity: Secondary | ICD-10-CM

## 2012-07-05 DIAGNOSIS — L97509 Non-pressure chronic ulcer of other part of unspecified foot with unspecified severity: Secondary | ICD-10-CM

## 2012-07-11 ENCOUNTER — Encounter (HOSPITAL_BASED_OUTPATIENT_CLINIC_OR_DEPARTMENT_OTHER): Payer: Medicare Other | Attending: Internal Medicine

## 2012-07-23 ENCOUNTER — Encounter: Payer: Self-pay | Admitting: Vascular Surgery

## 2012-07-24 ENCOUNTER — Encounter (INDEPENDENT_AMBULATORY_CARE_PROVIDER_SITE_OTHER): Payer: Medicare Other | Admitting: *Deleted

## 2012-07-24 ENCOUNTER — Other Ambulatory Visit: Payer: Self-pay

## 2012-07-24 ENCOUNTER — Encounter: Payer: Self-pay | Admitting: Vascular Surgery

## 2012-07-24 ENCOUNTER — Ambulatory Visit (INDEPENDENT_AMBULATORY_CARE_PROVIDER_SITE_OTHER): Payer: Medicare Other | Admitting: Vascular Surgery

## 2012-07-24 VITALS — BP 114/58 | HR 60 | Temp 97.5°F | Ht 73.0 in | Wt 134.0 lb

## 2012-07-24 DIAGNOSIS — L97409 Non-pressure chronic ulcer of unspecified heel and midfoot with unspecified severity: Secondary | ICD-10-CM

## 2012-07-24 DIAGNOSIS — I7025 Atherosclerosis of native arteries of other extremities with ulceration: Secondary | ICD-10-CM | POA: Insufficient documentation

## 2012-07-24 DIAGNOSIS — L98499 Non-pressure chronic ulcer of skin of other sites with unspecified severity: Secondary | ICD-10-CM

## 2012-07-24 DIAGNOSIS — L97509 Non-pressure chronic ulcer of other part of unspecified foot with unspecified severity: Secondary | ICD-10-CM

## 2012-07-24 DIAGNOSIS — I739 Peripheral vascular disease, unspecified: Secondary | ICD-10-CM

## 2012-07-24 NOTE — Progress Notes (Signed)
Vascular and Vein Specialist of [facility]  Patient name: [name] MRN: [id number] DOB: [birth year] Sex: male  REASON FOR CONSULT: gangrene of the left foot with diabetes and peripheral vascular disease. Referred by Dr. Murray Hodgkins.  HPI: [name] is a 78 y.o. male who has undergone previous right below-the-knee amputation by Dr. Lajoyce Corners. He states that he developed a wound on his left heel and also his left great toe and fifth toe. He believes that he has had these wounds for approximately 5 months. They have gradually progressed with no signs of healing. He hopes to become ambulatory with a prosthesis but the foot is obviously a pressing issue. He does not remember any specific injury to left foot. I do not get any clear-cut history of claudication although his activity is very limited. He has mild pain in the foot but no clearcut rest pain.  Of note in reviewing his hospital records appears that he had problems with sepsis and respiratory failure was hospitalized for some time. This was in August of 2013. He had a non-ST MI during that admission. He was seen by Dr. Patty Sermons in consultation at that time.  Past Medical History  Diagnosis Date  . Diabetes mellitus   . Cancer   . Thyroid disease   . Anemia   . Hyperlipidemia   . Neuropathy, diabetic   . Diabetic retinopathy     Family History  Problem Relation Age of Onset  . Hypertension      SOCIAL HISTORY: History  Substance Use Topics  . Smoking status: Former Smoker -- 0.2 packs/day    Types: Cigarettes    Quit date: 11/22/2006  . Smokeless tobacco: Never Used  . Alcohol Use: No    Allergies  Allergen Reactions  . Bactrim (Sulfamethoxazole W-Trimethoprim) Swelling    Lips, eyes, cheeks swollen  . Doxycycline Swelling    Lips, eyes, cheeks were all swollen    Current Outpatient Prescriptions  Medication Sig Dispense Refill  . albuterol (PROVENTIL) (2.5 MG/3ML) 0.083% nebulizer solution Take 2.5 mg by  nebulization every 2 (two) hours as needed. For wheezing/shortness of breath      . aspirin 81 MG chewable tablet Chew 1 tablet (81 mg total) by mouth daily.      Marland Kitchen docusate sodium (COLACE) 100 MG capsule Take 100 mg by mouth 2 (two) times daily.      . ferrous sulfate 325 (65 FE) MG tablet Take 325 mg by mouth daily with breakfast.      . insulin aspart (NOVOLOG) 100 UNIT/ML injection Inject 0-20 Units into the skin 4 (four) times daily -  with meals and at bedtime. Per sliding scale      . insulin glargine (LANTUS) 100 UNIT/ML injection Inject 1,020 Units into the skin at bedtime.      Marland Kitchen ipratropium (ATROVENT) 0.02 % nebulizer solution Take 0.5 mg by nebulization every 2 (two) hours as needed. For shortness of breath      . levothyroxine (SYNTHROID, LEVOTHROID) 75 MCG tablet Take 75 mcg by mouth daily.      . Magnesium Hydroxide (MILK OF MAGNESIA PO) Take 30 mLs by mouth as needed.      . metoprolol tartrate (LOPRESSOR) 25 MG tablet Take 12.5 mg by mouth 2 (two) times daily.      . mirtazapine (REMERON) 15 MG tablet Take 15 mg by mouth at bedtime. 1/2 tablet qhs      . omeprazole (PRILOSEC) 20 MG capsule Take 20 mg by mouth 2 (  two) times daily.      . silver sulfADIAZINE (SILVADENE) 1 % cream Apply 1 application topically daily.      . ranitidine (ZANTAC) 150 MG tablet Take 150 mg by mouth 2 (two) times daily.        REVIEW OF SYSTEMS: [name] ] denotes positive finding; [  ] denotes negative finding  CARDIOVASCULAR:  [ ]  chest pain   [ ]  chest pressure   [ ]  palpitations   [ ]  orthopnea   [name] ] dyspnea on exertion   [ ]  claudication   [ ]  rest pain   [ ]  DVT   [ ]  phlebitis PULMONARY:   [ ]  productive cough   [ ]  asthma   [ ]  wheezing NEUROLOGIC:   [name] ] weakness generalized [ ]  paresthesias  [ ]  aphasia  [ ]  amaurosis  [ ]  dizziness HEMATOLOGIC:   [ ]  bleeding problems   [ ]  clotting disorders MUSCULOSKELETAL:  [ ]  joint pain   [ ]  joint swelling [ ]  leg swelling GASTROINTESTINAL: [ ]   blood in  stool  [ ]   hematemesis GENITOURINARY:  [ ]   dysuria  [ ]   hematuria PSYCHIATRIC:  [ ]  history of major depression INTEGUMENTARY:  [ ]  rashes  [ ]  ulcers CONSTITUTIONAL:  [ ]  fever   [ ]  chills  PHYSICAL EXAM: Filed Vitals:   07/24/12 1247  BP: 114/58  Pulse: 60  Temp: 97.5 F (36.4 C)  TempSrc: Oral  Height: 6\' 1"  (1.854 m)  Weight: 134 lb (60.782 kg)  SpO2: 100%   Body mass index is 17.68 kg/(m^2). GENERAL: The patient is a well-nourished male, in no acute distress. The vital signs are documented above. CARDIOVASCULAR: There is a regular rate and rhythm. I do not detect carotid bruits. He has palpable femoral pulses. He has a diminished left popliteal pulse. I cannot palpate pedal pulses on the left. He has a dressing on his right below-the-knee amputation site. PULMONARY: There is good air exchange bilaterally without wheezing or rales. ABDOMEN: Soft and non-tender with normal pitched bowel sounds.  MUSCULOSKELETAL: he has a right below the knee amputation. NEUROLOGIC: No focal weakness or paresthesias are detected. SKIN: he has extensive wounds of the left foot. There is a full thickness heel decubitus on the left. Currently there is no significant drainage. There is gangrenous changes of the left great toe and the lateral aspect of the left fifth toe. PSYCHIATRIC: The patient has a normal affect.  DATA:  I have independently interpreted his arterial Doppler study today which shows triphasic Doppler signals in the left femoral artery with monophasic signals in the popliteal, peroneal, dorsalis pedis, and posterior tibial arteries. ABI on the left is 80% which is likely falsely elevated related to his diabetes.  MEDICAL ISSUES:  Atherosclerosis of native arteries of the extremities with ulceration(440.23) This patient has extensive wounds of the left foot with evidence of significant infrainguinal arterial occlusive disease on the left. Without revascularization, these wounds  will not heal and he will ultimately require a more proximal amputation. His only chance for limb salvage would be to proceed with arteriography to see if he has any options for revascularization. I have reviewed with the patient the indications for arteriography. In addition, I have reviewed the potential complications of arteriography including but not limited to: Bleeding, arterial injury, arterial thrombosis, dye action, renal insufficiency, or other unpredictable medical problems. I have explained to the patient that if we find disease amenable to  angioplasty we could potentially address this at the same time. I have discussed the potential complications of angioplasty and stenting, including but not limited to: Bleeding, arterial thrombosis, arterial injury, dissection, or the need for surgical intervention.  Even with revascularization, given the extent of the wounds, he is at significant risk for limb loss. However if he did have a good option for revascularization he could potentially undergo debridement of the heel wound and possibly placement of a VAC, in addition to possible great toe amputation and fifth toe amputation. We will make further recommendations pending the results of his arteriogram.   DICKSON,CHRISTOPHER S Vascular and Vein Specialists of [facility] Beeper: [phone]

## 2012-07-24 NOTE — Assessment & Plan Note (Signed)
This patient has extensive wounds of the left foot with evidence of significant infrainguinal arterial occlusive disease on the left. Without revascularization, these wounds will not heal and he will ultimately require a more proximal amputation. His only chance for limb salvage would be to proceed with arteriography to see if he has any options for revascularization. I have reviewed with the patient the indications for arteriography. In addition, I have reviewed the potential complications of arteriography including but not limited to: Bleeding, arterial injury, arterial thrombosis, dye action, renal insufficiency, or other unpredictable medical problems. I have explained to the patient that if we find disease amenable to angioplasty we could potentially address this at the same time. I have discussed the potential complications of angioplasty and stenting, including but not limited to: Bleeding, arterial thrombosis, arterial injury, dissection, or the need for surgical intervention.  Even with revascularization, given the extent of the wounds, he is at significant risk for limb loss. However if he did have a good option for revascularization he could potentially undergo debridement of the heel wound and possibly placement of a VAC, in addition to possible great toe amputation and fifth toe amputation. We will make further recommendations pending the results of his arteriogram.

## 2012-07-29 ENCOUNTER — Ambulatory Visit (HOSPITAL_COMMUNITY)
Admission: RE | Admit: 2012-07-29 | Discharge: 2012-07-29 | Disposition: A | Discharge status: Home / Self Care | Payer: Medicare Other | Source: Ambulatory Visit | Attending: Vascular Surgery | Admitting: Vascular Surgery

## 2012-07-29 ENCOUNTER — Telehealth: Payer: Self-pay | Admitting: Vascular Surgery

## 2012-07-29 ENCOUNTER — Encounter (HOSPITAL_COMMUNITY)
Admission: RE | Disposition: A | Discharge status: Home / Self Care | Payer: Self-pay | Source: Ambulatory Visit | Attending: Vascular Surgery

## 2012-07-29 DIAGNOSIS — Z7982 Long term (current) use of aspirin: Secondary | ICD-10-CM | POA: Insufficient documentation

## 2012-07-29 DIAGNOSIS — E1149 Type 2 diabetes mellitus with other diabetic neurological complication: Secondary | ICD-10-CM | POA: Insufficient documentation

## 2012-07-29 DIAGNOSIS — E785 Hyperlipidemia, unspecified: Secondary | ICD-10-CM | POA: Insufficient documentation

## 2012-07-29 DIAGNOSIS — Z794 Long term (current) use of insulin: Secondary | ICD-10-CM | POA: Insufficient documentation

## 2012-07-29 DIAGNOSIS — E1139 Type 2 diabetes mellitus with other diabetic ophthalmic complication: Secondary | ICD-10-CM | POA: Insufficient documentation

## 2012-07-29 DIAGNOSIS — E11319 Type 2 diabetes mellitus with unspecified diabetic retinopathy without macular edema: Secondary | ICD-10-CM | POA: Insufficient documentation

## 2012-07-29 DIAGNOSIS — I70219 Atherosclerosis of native arteries of extremities with intermittent claudication, unspecified extremity: Secondary | ICD-10-CM

## 2012-07-29 DIAGNOSIS — E079 Disorder of thyroid, unspecified: Secondary | ICD-10-CM | POA: Insufficient documentation

## 2012-07-29 DIAGNOSIS — Z859 Personal history of malignant neoplasm, unspecified: Secondary | ICD-10-CM | POA: Insufficient documentation

## 2012-07-29 DIAGNOSIS — Z79899 Other long term (current) drug therapy: Secondary | ICD-10-CM | POA: Insufficient documentation

## 2012-07-29 DIAGNOSIS — I70269 Atherosclerosis of native arteries of extremities with gangrene, unspecified extremity: Secondary | ICD-10-CM | POA: Insufficient documentation

## 2012-07-29 DIAGNOSIS — L97409 Non-pressure chronic ulcer of unspecified heel and midfoot with unspecified severity: Secondary | ICD-10-CM | POA: Insufficient documentation

## 2012-07-29 DIAGNOSIS — L97509 Non-pressure chronic ulcer of other part of unspecified foot with unspecified severity: Secondary | ICD-10-CM | POA: Insufficient documentation

## 2012-07-29 DIAGNOSIS — E1169 Type 2 diabetes mellitus with other specified complication: Secondary | ICD-10-CM | POA: Insufficient documentation

## 2012-07-29 DIAGNOSIS — E1142 Type 2 diabetes mellitus with diabetic polyneuropathy: Secondary | ICD-10-CM | POA: Insufficient documentation

## 2012-07-29 HISTORY — PX: LOWER EXTREMITY ANGIOGRAM: SHX5508

## 2012-07-29 HISTORY — PX: ABDOMINAL AORTAGRAM: SHX5454

## 2012-07-29 LAB — POCT I-STAT, CHEM 8
Calcium, Ion: 1.15 mmol/L (ref 1.13–1.30)
Calcium, Ion: 1.14 mmol/L (ref 1.13–1.30)
Chloride: 99 mEq/L (ref 96–112)
Chloride: 99 mEq/L (ref 96–112)
HCT: 28 % — ABNORMAL LOW (ref 39.0–52.0)
HCT: 14 % — ABNORMAL LOW (ref 39.0–52.0)
Hemoglobin: 9.5 g/dL — ABNORMAL LOW (ref 13.0–17.0)
Hemoglobin: 4.8 g/dL — CL (ref 13.0–17.0)
Potassium: 4.5 mEq/L (ref 3.5–5.1)

## 2012-07-29 SURGERY — ABDOMINAL AORTAGRAM
Anesthesia: LOCAL

## 2012-07-29 MED ORDER — HEPARIN (PORCINE) IN NACL 2-0.9 UNIT/ML-% IJ SOLN
INTRAMUSCULAR | Status: AC
  Filled 2012-07-29: qty 500

## 2012-07-29 MED ORDER — LIDOCAINE HCL (PF) 1 % IJ SOLN
INTRAMUSCULAR | Status: AC
  Filled 2012-07-29: qty 30

## 2012-07-29 MED ORDER — ACETAMINOPHEN 325 MG PO TABS: 650.0000 mg | ORAL_TABLET | ORAL | Status: DC | PRN

## 2012-07-29 MED ORDER — SODIUM CHLORIDE 0.9 % IV SOLN: 1.0000 mL/kg/h | INTRAVENOUS | Status: DC

## 2012-07-29 MED ORDER — SODIUM CHLORIDE 0.9 % IV SOLN
INTRAVENOUS | Status: DC
  Administered 2012-07-29: 08:00:00 via INTRAVENOUS

## 2012-07-29 MED ORDER — ONDANSETRON HCL 4 MG/2ML IJ SOLN: 4.0000 mg | Freq: Four times a day (QID) | INTRAMUSCULAR | Status: DC | PRN

## 2012-07-29 NOTE — H&P (View-Only) (Signed)
Vascular and Vein Specialist of [place]  Patient name: [name] MRN: [id number] DOB: 08/24/1929 Sex: male  REASON FOR CONSULT: gangrene of the left foot with diabetes and peripheral vascular disease. Referred by Dr. Arthur Green.  HPI: Clemens Bramlett is a 78 y.o. male who has undergone previous right below-the-knee amputation by Dr. Duda. He states that he developed a wound on his left heel and also his left great toe and fifth toe. He believes that he has had these wounds for approximately 5 months. They have gradually progressed with no signs of healing. He hopes to become ambulatory with a prosthesis but the foot is obviously a pressing issue. He does not remember any specific injury to left foot. I do not get any clear-cut history of claudication although his activity is very limited. He has mild pain in the foot but no clearcut rest pain.  Of note in reviewing his hospital records appears that he had problems with sepsis and respiratory failure was hospitalized for some time. This was in August of 2013. He had a non-ST MI during that admission. He was seen by Dr. Brackbill in consultation at that time.  Past Medical History  Diagnosis Date  . Diabetes mellitus   . Cancer   . Thyroid disease   . Anemia   . Hyperlipidemia   . Neuropathy, diabetic   . Diabetic retinopathy     Family History  Problem Relation Age of Onset  . Hypertension      SOCIAL HISTORY: History  Substance Use Topics  . Smoking status: Former Smoker -- 0.2 packs/day    Types: Cigarettes    Quit date: 11/22/2006  . Smokeless tobacco: Never Used  . Alcohol Use: No    Allergies  Allergen Reactions  . Bactrim (Sulfamethoxazole W-Trimethoprim) Swelling    Lips, eyes, cheeks swollen  . Doxycycline Swelling    Lips, eyes, cheeks were all swollen    Current Outpatient Prescriptions  Medication Sig Dispense Refill  . albuterol (PROVENTIL) (2.5 MG/3ML) 0.083% nebulizer solution Take 2.5 mg by  nebulization every 2 (two) hours as needed. For wheezing/shortness of breath      . aspirin 81 MG chewable tablet Chew 1 tablet (81 mg total) by mouth daily.      . docusate sodium (COLACE) 100 MG capsule Take 100 mg by mouth 2 (two) times daily.      . ferrous sulfate 325 (65 FE) MG tablet Take 325 mg by mouth daily with breakfast.      . insulin aspart (NOVOLOG) 100 UNIT/ML injection Inject 0-20 Units into the skin 4 (four) times daily -  with meals and at bedtime. Per sliding scale      . insulin glargine (LANTUS) 100 UNIT/ML injection Inject 1,020 Units into the skin at bedtime.      . ipratropium (ATROVENT) 0.02 % nebulizer solution Take 0.5 mg by nebulization every 2 (two) hours as needed. For shortness of breath      . levothyroxine (SYNTHROID, LEVOTHROID) 75 MCG tablet Take 75 mcg by mouth daily.      . Magnesium Hydroxide (MILK OF MAGNESIA PO) Take 30 mLs by mouth as needed.      . metoprolol tartrate (LOPRESSOR) 25 MG tablet Take 12.5 mg by mouth 2 (two) times daily.      . mirtazapine (REMERON) 15 MG tablet Take 15 mg by mouth at bedtime. 1/2 tablet qhs      . omeprazole (PRILOSEC) 20 MG capsule Take 20 mg by mouth 2 (  two) times daily.      . silver sulfADIAZINE (SILVADENE) 1 % cream Apply 1 application topically daily.      . ranitidine (ZANTAC) 150 MG tablet Take 150 mg by mouth 2 (two) times daily.        REVIEW OF SYSTEMS: [X ] denotes positive finding; [  ] denotes negative finding  CARDIOVASCULAR:  [ ] chest pain   [ ] chest pressure   [ ] palpitations   [ ] orthopnea   [X ] dyspnea on exertion   [ ] claudication   [ ] rest pain   [ ] DVT   [ ] phlebitis PULMONARY:   [ ] productive cough   [ ] asthma   [ ] wheezing NEUROLOGIC:   [X ] weakness generalized [ ] paresthesias  [ ] aphasia  [ ] amaurosis  [ ] dizziness HEMATOLOGIC:   [ ] bleeding problems   [ ] clotting disorders MUSCULOSKELETAL:  [ ] joint pain   [ ] joint swelling [ ] leg swelling GASTROINTESTINAL: [ ]  blood in  stool  [ ]  hematemesis GENITOURINARY:  [ ]  dysuria  [ ]  hematuria PSYCHIATRIC:  [ ] history of major depression INTEGUMENTARY:  [ ] rashes  [ ] ulcers CONSTITUTIONAL:  [ ] fever   [ ] chills  PHYSICAL EXAM: Filed Vitals:   07/24/12 1247  BP: 114/58  Pulse: 60  Temp: 97.5 F (36.4 C)  TempSrc: Oral  Height: 6' 1" (1.854 m)  Weight: 134 lb (60.782 kg)  SpO2: 100%   Body mass index is 17.68 kg/(m^2). GENERAL: The patient is a well-nourished male, in no acute distress. The vital signs are documented above. CARDIOVASCULAR: There is a regular rate and rhythm. I do not detect carotid bruits. He has palpable femoral pulses. He has a diminished left popliteal pulse. I cannot palpate pedal pulses on the left. He has a dressing on his right below-the-knee amputation site. PULMONARY: There is good air exchange bilaterally without wheezing or rales. ABDOMEN: Soft and non-tender with normal pitched bowel sounds.  MUSCULOSKELETAL: he has a right below the knee amputation. NEUROLOGIC: No focal weakness or paresthesias are detected. SKIN: he has extensive wounds of the left foot. There is a full thickness heel decubitus on the left. Currently there is no significant drainage. There is gangrenous changes of the left great toe and the lateral aspect of the left fifth toe. PSYCHIATRIC: The patient has a normal affect.  DATA:  I have independently interpreted his arterial Doppler study today which shows triphasic Doppler signals in the left femoral artery with monophasic signals in the popliteal, peroneal, dorsalis pedis, and posterior tibial arteries. ABI on the left is 80% which is likely falsely elevated related to his diabetes.  MEDICAL ISSUES:  Atherosclerosis of native arteries of the extremities with ulceration(440.23) This patient has extensive wounds of the left foot with evidence of significant infrainguinal arterial occlusive disease on the left. Without revascularization, these wounds  will not heal and he will ultimately require a more proximal amputation. His only chance for limb salvage would be to proceed with arteriography to see if he has any options for revascularization. I have reviewed with the patient the indications for arteriography. In addition, I have reviewed the potential complications of arteriography including but not limited to: Bleeding, arterial injury, arterial thrombosis, dye action, renal insufficiency, or other unpredictable medical problems. I have explained to the patient that if we find disease amenable to   angioplasty we could potentially address this at the same time. I have discussed the potential complications of angioplasty and stenting, including but not limited to: Bleeding, arterial thrombosis, arterial injury, dissection, or the need for surgical intervention.  Even with revascularization, given the extent of the wounds, he is at significant risk for limb loss. However if he did have a good option for revascularization he could potentially undergo debridement of the heel wound and possibly placement of a VAC, in addition to possible great toe amputation and fifth toe amputation. We will make further recommendations pending the results of his arteriogram.   Loris Winrow S Vascular and Vein Specialists of [place] Beeper: [phone]    

## 2012-07-29 NOTE — Telephone Encounter (Addendum)
Message copied by Rosalyn Charters on Mon Jul 29, 2012  1:25 PM ------      Message from: Melene Plan      Created: Mon Jul 29, 2012 12:04 PM      Regarding: FW: CHARGE AND F/U                   ----- Message -----         From: Chuck Hint, MD         Sent: 07/29/2012  10:47 AM           To: Reuel Derby, Melene Plan, RN      Subject: CHARGE AND F/U                                           PROCEDURE:       1. Ultrasound-guided access to the right common femoral artery      2. Aortogram with bilateral iliac arteriogram.      3. Selective catheterization of the left external iliac artery with left lower extremity runoff            SURGEON: Di Kindle. Edilia Bo, MD, FACS            He does not have any options for revascularization. If his foot progresses he would need a primary below-the-knee amputation. I can see him in the office in 3-4 weeks.            Thanks      [facility]  notified debra millikan at [facility] of fu appt. with dr. Edilia Bo on 08-21-12 at 12:45

## 2012-07-29 NOTE — Interval H&P Note (Signed)
History and Physical Interval Note:  07/29/2012 8:25 AM  [name]  has presented today for surgery, with the diagnosis of PVD  The various methods of treatment have been discussed with the patient and family. After consideration of risks, benefits and other options for treatment, the patient has consented to  Procedure(s) (LRB) with comments: ABDOMINAL AORTAGRAM (N/A) possible angioplasty and stenting of the left leg. [name] history has been reviewed, patient examined, no change in status, stable for surgery.  I have reviewed the patient's chart and labs.  Questions were answered to the patient's satisfaction.     Treshun Wold S

## 2012-07-29 NOTE — Op Note (Signed)
PATIENT: [name]   MRN: [id number] DOB: [birth year]    DATE OF PROCEDURE: 07/29/2012  INDICATIONS: [name] is a 78 y.o. male who presented with gangrenous wounds of his left foot. He is brought in for diagnostic arteriography and possible left lower extremity angioplasty.  PROCEDURE:  1. Ultrasound-guided access to the right common femoral artery 2. Aortogram with bilateral iliac arteriogram. 3. Selective catheterization of the left external iliac artery with left lower extremity runoff  SURGEON: Di Kindle. Edilia Bo, MD, FACS  ANESTHESIA: local   EBL: minimal  TECHNIQUE: The patient was brought to the peripheral vascular lab and both groins were prepped and draped in usual sterile fashion. After the skin was infiltrated with 1% lidocaine, and under ultrasound guidance, the right common femoral artery was cannulated and a guidewire introduced into the infrarenal aorta under fluoroscopic control. A 5 French sheath was introduced over the wire. A pigtail catheter was positioned at the L1 vertebral body. Flush aortogram was obtained. The catheter was positioned of the aortic bifurcation and an oblique iliac projection was obtained. Next the pigtail catheter was exchanged for a crossover catheter which was positioned into the proximal left common iliac artery. The wire was advanced into the external iliac artery and the crossover catheter exchange for an end hole catheter. Selective left external iliac arteriogram was obtained with left lower extremity runoff.  FINDINGS:  1. The aorta is widely patent with no significant infrarenal aortic occlusive disease noted. There is one renal artery on the right and 2 renal arteries on the left which are patent. 2. Bilateral common iliac arteries, external iliac arteries, and hypogastric arteries are patent. 3. On the left side, the common femoral and deep femoral arteries are patent. The superficial femoral artery is patent proximally. There is some  moderate disease distally but no focal stenosis. There is moderate severe disease of the popliteal artery at the level of the knee. There is diffuse tibial artery occlusive disease affecting the distal anterior tibial artery, peroneal artery, and posterior tibial artery. There are extensive collaterals.  CLINICAL NOTE:  There are no bypass options which would significantly impacted distal circulation. Likewise the patient did not have disease amenable to angioplasty.  [name], MD, FACS [facility]ascular and Vein Specialists of [facility]  DATE OF DICTATION:   07/29/2012

## 2012-08-07 ENCOUNTER — Telehealth: Payer: Self-pay

## 2012-08-07 NOTE — Telephone Encounter (Signed)
Spoke with Randa Evens, nurse at [facility].  Reported that pt. Has signs of increased discoloration of left lateral and bottom foot with darkening of left gr. Toe and 5th toe.  States pt. Is not complaining of pain.  States pt. Has no open sores at this time.   Has appt. 08/21/12 to f/u w/ Dr. Edilia Bo following Aortogram of 07/29/12.  Will d/w Dr. Edilia Bo re: worsening symptoms.

## 2012-08-07 NOTE — Telephone Encounter (Signed)
Message copied by Phillips Odor on Wed Aug 07, 2012 10:08 AM ------      Message from: Shari Prows      Created: Tue Aug 06, 2012  2:47 PM      Regarding: triage phone call      Contact: [phone]       Please call Randa Evens @ [facility] regarding this pt and his worsening l foot wounds. He ahs an appt w/ [facility] on 08/21/12 already scheduled.       Thanks, Drinda Butts

## 2012-08-07 NOTE — Telephone Encounter (Signed)
Discussed symptoms reported on 12/3, per nurse at [facility]  w/ Dr. Edilia Bo.  Advised to keep appt. On 08/21/12, and requested that family member attend pt. At appt. To discuss surgical treatment.  Called [place]; spoke w/ [place].  Advised of Dr. Adele Dan recommendation.  Verb. Understanding.

## 2012-08-14 ENCOUNTER — Telehealth: Payer: Self-pay

## 2012-08-14 NOTE — Telephone Encounter (Addendum)
Phone call from nurse at [facility].  Reports that pt's "left foot is worse".  States the "necrotic tissue has extended to bottom of foot and has a foul odor, and is moist between 4th and 5th toes."  Questioned if pt. can be seen sooner than 12/18 for re-evaluation of left foot.   Discussed with Dr. Edilia Bo.  Advised to start daily foot soaks with lukewarm water and dial soap, and to wrap gauze dressing on foot, and between the toes and change prn, to keep left foot clean and dry.  Also recommends to start Keflex 500 mg tid x 7 days.  Phone call to [facility]; spoke with nurse, Edgardo Roys, and gave verbal order for the above treatment and antibiotic.  Pt. Should keep appt. For 08/21/12, and nursing facility should call if worsening of symptoms.  Will fax order to the facility at [phone].

## 2012-08-20 ENCOUNTER — Encounter: Payer: Self-pay | Admitting: Vascular Surgery

## 2012-08-21 ENCOUNTER — Ambulatory Visit (INDEPENDENT_AMBULATORY_CARE_PROVIDER_SITE_OTHER): Payer: Medicare Other | Admitting: Vascular Surgery

## 2012-08-21 ENCOUNTER — Encounter: Payer: Self-pay | Admitting: Vascular Surgery

## 2012-08-21 VITALS — BP 108/55 | HR 83 | Ht 73.0 in | Wt 134.0 lb

## 2012-08-21 DIAGNOSIS — I739 Peripheral vascular disease, unspecified: Secondary | ICD-10-CM

## 2012-08-21 DIAGNOSIS — L98499 Non-pressure chronic ulcer of skin of other sites with unspecified severity: Secondary | ICD-10-CM

## 2012-08-21 NOTE — Assessment & Plan Note (Signed)
This patient has extensive wounds involving the left heel, the lateral aspect of the left foot, and the left great toe. He has undergone an arteriogram which shows that there were no options for revascularization which would significantly impacted distal circulation. Likewise there was no disease amenable to angioplasty. I have recommended that we proceed with a left below the knee amputation. I think he has a good chance of healing a below-the-knee amputation with a probable 10% chance of nonhealing. He will discuss this with his family and then called to schedule his surgery.

## 2012-08-21 NOTE — Progress Notes (Signed)
[facility] [place]  Patient name: [name] MRN: [id number] DOB: 08/08/1929 Sex: male  REASON FOR VISIT: follow up of left foot wounds appear  HPI: [name] is a 78 y.o. male who I had seen in consultation on 07/24/2012 with gangrene of the left foot and evidence of peripheral vascular disease. The patient has diabetes. He has undergone a previous right below-the-knee amputation area and he does not have a prosthesis. He has extensive wounds involving the entire lateral aspect of the left foot, the left heel, and the left great toe. He underwent an arteriogram which showed that the common femoral and deep femoral arteries on the left were patent. The superficial femoral artery was patent proximally. It was moderate disease distally but no focal stenosis. There was moderate to severe tibial and popliteal artery occlusive disease. There were no options for revascularization that would significantly impacted distal circulation. Likewise there were no lesions amenable to angioplasty. Comes in for continued follow up.  He denies any fever or chills. Dressing changes are done at the skilled nursing facility.   REVIEW OF SYSTEMS: [name] ] denotes positive finding; [  ] denotes negative finding  CARDIOVASCULAR:  [ ]  chest pain   [ ]  dyspnea on exertion    CONSTITUTIONAL:  [ ]  fever   [ ]  chills  PHYSICAL EXAM: Filed Vitals:   08/21/12 1229  BP: 108/55  Pulse: 83  Height: 6\' 1"  (1.854 m)  Weight: 134 lb (60.782 kg)  SpO2: 100%   Body mass index is 17.68 kg/(m^2). GENERAL: The patient is a well-nourished male, in no acute distress. The vital signs are documented above. CARDIOVASCULAR: There is a regular rate and rhythm  PULMONARY: There is good air exchange bilaterally without wheezing or rales. He has a palpable left femoral pulse with absent popliteal pulse on the left. He has extensive wounds involving the entire lateral aspect of his left foot which are full  thickness. He has a full thickness heel decubitus on the left and also gangrene of the left great toe. Isn't odor but no significant drainage currently. There is no significant erythema currently.  MEDICAL ISSUES:  Atherosclerosis of native arteries of the extremities with ulceration(440.23) This patient has extensive wounds involving the left heel, the lateral aspect of the left foot, and the left great toe. He has undergone an arteriogram which shows that there were no options for revascularization which would significantly impacted distal circulation. Likewise there was no disease amenable to angioplasty. I have recommended that we proceed with a left below the knee amputation. I think he has a good chance of healing a below-the-knee amputation with a probable 10% chance of nonhealing. He will discuss this with his family and then called to schedule his surgery.   [name] S [facility] [place] Beeper: [phone]

## 2012-08-22 ENCOUNTER — Encounter: Payer: Self-pay | Admitting: Vascular Surgery

## 2012-09-02 ENCOUNTER — Other Ambulatory Visit (HOSPITAL_COMMUNITY): Payer: Self-pay | Admitting: Orthopedic Surgery

## 2012-09-12 MED ORDER — CEFAZOLIN SODIUM-DEXTROSE 2-3 GM-% IV SOLR
2.0000 g | INTRAVENOUS | Status: DC
  Filled 2012-09-12: qty 50

## 2012-09-13 ENCOUNTER — Inpatient Hospital Stay (HOSPITAL_COMMUNITY)
Admission: RE | Admit: 2012-09-13 | Discharge: 2012-09-16 | DRG: 617 | Disposition: A | Discharge status: Skilled Nursing Facility | Payer: Medicare Other | Source: Ambulatory Visit | Attending: Orthopedic Surgery | Admitting: Orthopedic Surgery

## 2012-09-13 ENCOUNTER — Encounter (HOSPITAL_COMMUNITY)
Admission: RE | Disposition: A | Discharge status: Skilled Nursing Facility | Payer: Self-pay | Source: Ambulatory Visit | Attending: Orthopedic Surgery

## 2012-09-13 ENCOUNTER — Inpatient Hospital Stay (HOSPITAL_COMMUNITY): Payer: Medicare Other

## 2012-09-13 ENCOUNTER — Encounter (HOSPITAL_COMMUNITY): Payer: Self-pay | Admitting: Surgery

## 2012-09-13 DIAGNOSIS — M9080 Osteopathy in diseases classified elsewhere, unspecified site: Secondary | ICD-10-CM | POA: Diagnosis present

## 2012-09-13 DIAGNOSIS — Z859 Personal history of malignant neoplasm, unspecified: Secondary | ICD-10-CM

## 2012-09-13 DIAGNOSIS — E1169 Type 2 diabetes mellitus with other specified complication: Principal | ICD-10-CM | POA: Diagnosis present

## 2012-09-13 DIAGNOSIS — Z881 Allergy status to other antibiotic agents status: Secondary | ICD-10-CM

## 2012-09-13 DIAGNOSIS — Z79899 Other long term (current) drug therapy: Secondary | ICD-10-CM

## 2012-09-13 DIAGNOSIS — E13622 Other specified diabetes mellitus with other skin ulcer: Secondary | ICD-10-CM

## 2012-09-13 DIAGNOSIS — E11319 Type 2 diabetes mellitus with unspecified diabetic retinopathy without macular edema: Secondary | ICD-10-CM | POA: Diagnosis present

## 2012-09-13 DIAGNOSIS — S88119A Complete traumatic amputation at level between knee and ankle, unspecified lower leg, initial encounter: Secondary | ICD-10-CM

## 2012-09-13 DIAGNOSIS — L03119 Cellulitis of unspecified part of limb: Secondary | ICD-10-CM

## 2012-09-13 DIAGNOSIS — M869 Osteomyelitis, unspecified: Secondary | ICD-10-CM | POA: Diagnosis present

## 2012-09-13 DIAGNOSIS — Z860100 Personal history of colon polyps, unspecified: Secondary | ICD-10-CM

## 2012-09-13 DIAGNOSIS — E785 Hyperlipidemia, unspecified: Secondary | ICD-10-CM | POA: Diagnosis present

## 2012-09-13 DIAGNOSIS — E1149 Type 2 diabetes mellitus with other diabetic neurological complication: Secondary | ICD-10-CM | POA: Diagnosis present

## 2012-09-13 DIAGNOSIS — Z87891 Personal history of nicotine dependence: Secondary | ICD-10-CM

## 2012-09-13 DIAGNOSIS — E039 Hypothyroidism, unspecified: Secondary | ICD-10-CM | POA: Diagnosis present

## 2012-09-13 DIAGNOSIS — E11621 Type 2 diabetes mellitus with foot ulcer: Secondary | ICD-10-CM

## 2012-09-13 DIAGNOSIS — D649 Anemia, unspecified: Secondary | ICD-10-CM | POA: Diagnosis present

## 2012-09-13 DIAGNOSIS — E1159 Type 2 diabetes mellitus with other circulatory complications: Secondary | ICD-10-CM | POA: Diagnosis present

## 2012-09-13 DIAGNOSIS — M129 Arthropathy, unspecified: Secondary | ICD-10-CM | POA: Diagnosis present

## 2012-09-13 DIAGNOSIS — I96 Gangrene, not elsewhere classified: Secondary | ICD-10-CM | POA: Diagnosis present

## 2012-09-13 DIAGNOSIS — Z9089 Acquired absence of other organs: Secondary | ICD-10-CM

## 2012-09-13 DIAGNOSIS — Z794 Long term (current) use of insulin: Secondary | ICD-10-CM

## 2012-09-13 DIAGNOSIS — K219 Gastro-esophageal reflux disease without esophagitis: Secondary | ICD-10-CM | POA: Diagnosis present

## 2012-09-13 DIAGNOSIS — Z7982 Long term (current) use of aspirin: Secondary | ICD-10-CM

## 2012-09-13 DIAGNOSIS — E1142 Type 2 diabetes mellitus with diabetic polyneuropathy: Secondary | ICD-10-CM | POA: Diagnosis present

## 2012-09-13 DIAGNOSIS — E1139 Type 2 diabetes mellitus with other diabetic ophthalmic complication: Secondary | ICD-10-CM | POA: Diagnosis present

## 2012-09-13 HISTORY — DX: Urgency of urination: R39.15

## 2012-09-13 HISTORY — DX: Peripheral vascular disease, unspecified: I73.9

## 2012-09-13 HISTORY — DX: Gastro-esophageal reflux disease without esophagitis: K21.9

## 2012-09-13 HISTORY — DX: Unspecified osteoarthritis, unspecified site: M19.90

## 2012-09-13 HISTORY — DX: Unspecified open wound of lower back and pelvis without penetration into retroperitoneum, initial encounter: S31.000A

## 2012-09-13 HISTORY — DX: Hypothyroidism, unspecified: E03.9

## 2012-09-13 LAB — COMPREHENSIVE METABOLIC PANEL
ALT: 5 U/L (ref 0–53)
AST: 10 U/L (ref 0–37)
Alkaline Phosphatase: 76 U/L (ref 39–117)
CO2: 29 mEq/L (ref 19–32)
Chloride: 99 mEq/L (ref 96–112)
GFR calc Af Amer: >90 mL/min (ref >90)
GFR calc non Af Amer: 78 mL/min — ABNORMAL LOW (ref >90)
Glucose, Bld: 177 mg/dL — ABNORMAL HIGH (ref 70–99)
Potassium: 4.1 mEq/L (ref 3.5–5.1)
Sodium: 136 mEq/L (ref 135–145)
Total Bilirubin: 0.2 mg/dL — ABNORMAL LOW (ref 0.3–1.2)

## 2012-09-13 LAB — GLUCOSE, CAPILLARY
Glucose-Capillary: 167 mg/dL — ABNORMAL HIGH (ref 70–99)
Glucose-Capillary: 151 mg/dL — ABNORMAL HIGH (ref 70–99)
Glucose-Capillary: 190 mg/dL — ABNORMAL HIGH (ref 70–99)
Glucose-Capillary: 106 mg/dL — ABNORMAL HIGH (ref 70–99)

## 2012-09-13 LAB — CBC
Hemoglobin: 6.1 g/dL — CL (ref 13.0–17.0)
MCH: 30 pg (ref 26.0–34.0)
Platelets: 386 10*3/uL (ref 150–400)
RBC: 2.03 MIL/uL — ABNORMAL LOW (ref 4.22–5.81)
WBC: 12 10*3/uL — ABNORMAL HIGH (ref 4.0–10.5)

## 2012-09-13 LAB — APTT: aPTT: 42 seconds — ABNORMAL HIGH (ref 24–37)

## 2012-09-13 LAB — SURGICAL PCR SCREEN
MRSA, PCR: NEGATIVE
Staphylococcus aureus: NEGATIVE

## 2012-09-13 SURGERY — CANCELLED PROCEDURE

## 2012-09-13 MED ORDER — INSULIN GLARGINE 100 UNIT/ML ~~LOC~~ SOLN: 10.0000 [IU] | Freq: Every day | SUBCUTANEOUS | Status: DC

## 2012-09-13 MED ORDER — CEFAZOLIN SODIUM-DEXTROSE 2-3 GM-% IV SOLR: 2.0000 g | INTRAVENOUS | Status: DC

## 2012-09-13 MED ORDER — LEVOTHYROXINE SODIUM 88 MCG PO TABS
88.0000 ug | ORAL_TABLET | Freq: Every day | ORAL | Status: DC
  Filled 2012-09-13 (×2): qty 1

## 2012-09-13 MED ORDER — INSULIN ASPART 100 UNIT/ML ~~LOC~~ SOLN
0.0000 [IU] | Freq: Three times a day (TID) | SUBCUTANEOUS | Status: DC
  Administered 2012-09-13: 3 [IU] via SUBCUTANEOUS

## 2012-09-13 MED ORDER — ALBUTEROL SULFATE (5 MG/ML) 0.5% IN NEBU: 2.5000 mg | INHALATION_SOLUTION | RESPIRATORY_TRACT | Status: DC | PRN

## 2012-09-13 MED ORDER — INSULIN GLARGINE 100 UNIT/ML ~~LOC~~ SOLN
10.0000 [IU] | Freq: Every day | SUBCUTANEOUS | Status: DC
  Administered 2012-09-13: 10 [IU] via SUBCUTANEOUS

## 2012-09-13 MED ORDER — HYDROMORPHONE HCL PF 1 MG/ML IJ SOLN: 0.5000 mg | INTRAMUSCULAR | Status: DC | PRN

## 2012-09-13 MED ORDER — METOCLOPRAMIDE HCL 10 MG PO TABS: 5.0000 mg | ORAL_TABLET | Freq: Three times a day (TID) | ORAL | Status: DC | PRN

## 2012-09-13 MED ORDER — OXYCODONE-ACETAMINOPHEN 5-325 MG PO TABS: 1.0000 | ORAL_TABLET | ORAL | Status: DC | PRN

## 2012-09-13 MED ORDER — FLEET ENEMA 7-19 GM/118ML RE ENEM
1.0000 | ENEMA | Freq: Once | RECTAL | Status: AC
  Administered 2012-09-13: 1 via RECTAL
  Filled 2012-09-13: qty 1

## 2012-09-13 MED ORDER — CHLORHEXIDINE GLUCONATE 4 % EX LIQD
60.0000 mL | Freq: Once | CUTANEOUS | Status: DC
  Filled 2012-09-13: qty 60

## 2012-09-13 MED ORDER — ONDANSETRON HCL 4 MG PO TABS: 4.0000 mg | ORAL_TABLET | Freq: Four times a day (QID) | ORAL | Status: DC | PRN

## 2012-09-13 MED ORDER — ONDANSETRON HCL 4 MG/2ML IJ SOLN: 4.0000 mg | Freq: Four times a day (QID) | INTRAMUSCULAR | Status: DC | PRN

## 2012-09-13 MED ORDER — INSULIN ASPART 100 UNIT/ML ~~LOC~~ SOLN
4.0000 [IU] | Freq: Three times a day (TID) | SUBCUTANEOUS | Status: DC
  Administered 2012-09-13: 4 [IU] via SUBCUTANEOUS

## 2012-09-13 MED ORDER — SODIUM CHLORIDE 0.9 % IV SOLN
INTRAVENOUS | Status: DC
  Administered 2012-09-13: 19:00:00 via INTRAVENOUS

## 2012-09-13 MED ORDER — BISACODYL 10 MG RE SUPP: 10.0000 mg | Freq: Every day | RECTAL | Status: DC | PRN

## 2012-09-13 MED ORDER — ASPIRIN 81 MG PO CHEW
81.0000 mg | CHEWABLE_TABLET | Freq: Every day | ORAL | Status: DC
  Administered 2012-09-13: 81 mg via ORAL
  Filled 2012-09-13 (×2): qty 1

## 2012-09-13 MED ORDER — MUPIROCIN 2 % EX OINT
TOPICAL_OINTMENT | CUTANEOUS | Status: AC
  Administered 2012-09-13: 1
  Filled 2012-09-13: qty 22

## 2012-09-13 MED ORDER — METOPROLOL TARTRATE 12.5 MG HALF TABLET
12.5000 mg | ORAL_TABLET | Freq: Two times a day (BID) | ORAL | Status: DC
  Administered 2012-09-13 – 2012-09-14 (×2): 12.5 mg via ORAL
  Filled 2012-09-13 (×4): qty 1

## 2012-09-13 MED ORDER — HYDROCODONE-ACETAMINOPHEN 5-325 MG PO TABS: 1.0000 | ORAL_TABLET | ORAL | Status: DC | PRN

## 2012-09-13 MED ORDER — METOCLOPRAMIDE HCL 5 MG/ML IJ SOLN: 5.0000 mg | Freq: Three times a day (TID) | INTRAMUSCULAR | Status: DC | PRN

## 2012-09-13 MED ORDER — DISPOSABLE ENEMA 19-7 GM/118ML RE ENEM: 1.0000 | ENEMA | Freq: Once | RECTAL | Status: DC | PRN

## 2012-09-13 MED ORDER — FERROUS SULFATE 325 (65 FE) MG PO TABS
325.0000 mg | ORAL_TABLET | Freq: Every day | ORAL | Status: DC
  Filled 2012-09-13 (×2): qty 1

## 2012-09-13 MED ORDER — DOCUSATE SODIUM 100 MG PO CAPS
100.0000 mg | ORAL_CAPSULE | Freq: Two times a day (BID) | ORAL | Status: DC
  Administered 2012-09-13: 100 mg via ORAL
  Filled 2012-09-13 (×3): qty 1

## 2012-09-13 MED ORDER — PANTOPRAZOLE SODIUM 40 MG PO TBEC
40.0000 mg | DELAYED_RELEASE_TABLET | Freq: Every day | ORAL | Status: DC
  Filled 2012-09-13: qty 1

## 2012-09-13 MED ORDER — IPRATROPIUM BROMIDE 0.02 % IN SOLN: 0.5000 mg | RESPIRATORY_TRACT | Status: DC | PRN

## 2012-09-13 MED ORDER — MIRTAZAPINE 7.5 MG PO TABS
7.5000 mg | ORAL_TABLET | Freq: Every day | ORAL | Status: DC
  Administered 2012-09-13: 7.5 mg via ORAL
  Filled 2012-09-13 (×2): qty 1

## 2012-09-13 SURGICAL SUPPLY — 45 items
BANDAGE ESMARK 6X9 LF (GAUZE/BANDAGES/DRESSINGS) ×2 IMPLANT
BANDAGE GAUZE ELAST BULKY 4 IN (GAUZE/BANDAGES/DRESSINGS) ×6 IMPLANT
BLADE SAW RECIP 87.9 MT (BLADE) ×3 IMPLANT
BLADE SURG 21 STRL SS (BLADE) ×3 IMPLANT
BNDG CMPR 9X6 STRL LF SNTH (GAUZE/BANDAGES/DRESSINGS) ×1
BNDG COHESIVE 6X5 TAN STRL LF (GAUZE/BANDAGES/DRESSINGS) ×6 IMPLANT
BNDG ESMARK 6X9 LF (GAUZE/BANDAGES/DRESSINGS) ×2
CLOTH BEACON ORANGE TIMEOUT ST (SAFETY) ×3 IMPLANT
COVER SURGICAL LIGHT HANDLE (MISCELLANEOUS) ×3 IMPLANT
CUFF TOURNIQUET SINGLE 34IN LL (TOURNIQUET CUFF) IMPLANT
CUFF TOURNIQUET SINGLE 44IN (TOURNIQUET CUFF) IMPLANT
DRAIN PENROSE 1/2X12 LTX STRL (WOUND CARE) IMPLANT
DRAPE EXTREMITY T 121X128X90 (DRAPE) ×3 IMPLANT
DRAPE PROXIMA HALF (DRAPES) ×6 IMPLANT
DRAPE U-SHAPE 47X51 STRL (DRAPES) ×6 IMPLANT
DRSG ADAPTIC 3X8 NADH LF (GAUZE/BANDAGES/DRESSINGS) ×3 IMPLANT
DRSG PAD ABDOMINAL 8X10 ST (GAUZE/BANDAGES/DRESSINGS) ×3 IMPLANT
DURAPREP 26ML APPLICATOR (WOUND CARE) ×3 IMPLANT
ELECT REM PT RETURN 9FT ADLT (ELECTROSURGICAL) ×2
ELECTRODE REM PT RTRN 9FT ADLT (ELECTROSURGICAL) ×2 IMPLANT
EVACUATOR 1/8 PVC DRAIN (DRAIN) IMPLANT
GLOVE BIOGEL PI IND STRL 9 (GLOVE) ×2 IMPLANT
GLOVE BIOGEL PI INDICATOR 9 (GLOVE) ×1
GLOVE SURG ORTHO 9.0 STRL STRW (GLOVE) ×3 IMPLANT
GOWN PREVENTION PLUS XLARGE (GOWN DISPOSABLE) ×3 IMPLANT
GOWN SRG XL XLNG 56XLVL 4 (GOWN DISPOSABLE) ×2 IMPLANT
GOWN STRL NON-REIN XL XLG LVL4 (GOWN DISPOSABLE) ×2
KIT BASIN OR (CUSTOM PROCEDURE TRAY) ×3 IMPLANT
KIT ROOM TURNOVER OR (KITS) ×3 IMPLANT
MANIFOLD NEPTUNE II (INSTRUMENTS) ×3 IMPLANT
NS IRRIG 1000ML POUR BTL (IV SOLUTION) ×3 IMPLANT
PACK GENERAL/GYN (CUSTOM PROCEDURE TRAY) ×3 IMPLANT
PAD ARMBOARD 7.5X6 YLW CONV (MISCELLANEOUS) ×6 IMPLANT
SPONGE GAUZE 4X4 12PLY (GAUZE/BANDAGES/DRESSINGS) ×3 IMPLANT
SPONGE LAP 18X18 X RAY DECT (DISPOSABLE) IMPLANT
STAPLER VISISTAT 35W (STAPLE) IMPLANT
STOCKINETTE IMPERVIOUS LG (DRAPES) ×3 IMPLANT
SUT PDS AB 1 CT  36 (SUTURE)
SUT PDS AB 1 CT 36 (SUTURE) IMPLANT
SUT SILK 2 0 (SUTURE) ×2
SUT SILK 2-0 18XBRD TIE 12 (SUTURE) ×2 IMPLANT
TOWEL OR 17X24 6PK STRL BLUE (TOWEL DISPOSABLE) ×3 IMPLANT
TOWEL OR 17X26 10 PK STRL BLUE (TOWEL DISPOSABLE) ×3 IMPLANT
TUBE ANAEROBIC SPECIMEN COL (MISCELLANEOUS) IMPLANT
WATER STERILE IRR 1000ML POUR (IV SOLUTION) ×3 IMPLANT

## 2012-09-13 NOTE — H&P (Signed)
[name] is an 79 y.o. male.   Chief Complaint: Gangrene left foot HPI: Patient is an 79 year old gentleman diabetic peripheral vascular disease with gangrene of the left lower extremity. He has failed conservative treatment and presents at this time for surgical intervention.  Past Medical History  Diagnosis Date  . Diabetes mellitus   . Thyroid disease   . Anemia   . Hyperlipidemia   . Neuropathy, diabetic   . Diabetic retinopathy   . GERD (gastroesophageal reflux disease)   . Arthritis   . Urgency of urination   . Hypothyroidism   . Sacral wound     hx of  . Cancer   . Diabetic retinopathy     Past Surgical History  Procedure Date  . Cataract extraction, bilateral   . Colonoscopy w/ polypectomy   . Esophagogastroduodenoscopy 04/25/2012    Procedure: ESOPHAGOGASTRODUODENOSCOPY (EGD);  Surgeon: Meryl Dare, MD,FACG;  Location: [facility];  Service: Endoscopy;  Laterality: N/A;  . Amputation 05/16/2012    Procedure: AMPUTATION BELOW KNEE;  Surgeon: Nadara Mustard, MD;  Location: [facility] OR;  Service: Orthopedics;  Laterality: Right;  Right Below Knee Amputation  . Tonsillectomy   . Foot amputation     right bka  . Eye surgery     bilateral    Family History  Problem Relation Age of Onset  . Hypertension     Social History:  reports that he quit smoking about 5 years ago. His smoking use included Cigarettes. He smoked .25 packs per day. He has never used smokeless tobacco. He reports that he drinks alcohol. He reports that he does not use illicit drugs.  Allergies:  Allergies  Allergen Reactions  . Bactrim (Sulfamethoxazole W-Trimethoprim) Swelling    Lips, eyes, cheeks swollen  . Doxycycline Swelling    Lips, eyes, cheeks were all swollen    Medications Prior to Admission  Medication Sig Dispense Refill  . docusate sodium (COLACE) 100 MG capsule Take 100 mg by mouth 2 (two) times daily.      . ferrous sulfate 325 (65 FE) MG tablet Take 325 mg by mouth daily  with breakfast.      . insulin glargine (LANTUS) 100 UNIT/ML injection Inject 10 Units into the skin daily.       . insulin NPH-insulin regular (NOVOLIN 70/30) (70-30) 100 UNIT/ML injection Inject 20 Units into the skin daily with breakfast.      . levothyroxine (SYNTHROID, LEVOTHROID) 88 MCG tablet Take 88 mcg by mouth daily.      . metoprolol tartrate (LOPRESSOR) 25 MG tablet Take 12.5 mg by mouth 2 (two) times daily.      Marland Kitchen omeprazole (PRILOSEC) 20 MG capsule Take 20 mg by mouth 2 (two) times daily.      Marland Kitchen albuterol (PROVENTIL) (2.5 MG/3ML) 0.083% nebulizer solution Take 2.5 mg by nebulization every 2 (two) hours as needed. For wheezing/shortness of breath      . aspirin 81 MG chewable tablet Chew 1 tablet (81 mg total) by mouth daily.      . bisacodyl (DULCOLAX) 10 MG suppository Place 10 mg rectally daily as needed. For constipation If no relief from Milk Of Magnesia      . ipratropium (ATROVENT) 0.02 % nebulizer solution Take 0.5 mg by nebulization every 2 (two) hours as needed. For shortness of breath      . Magnesium Hydroxide (MILK OF MAGNESIA PO) Take 30 mLs by mouth as needed.      . mirtazapine (REMERON) 15  MG tablet Take 7.5 mg by mouth at bedtime.       . Pollen Extracts (PROSTAT PO) Take 32 mLs by mouth 2 (two) times daily.      . sodium phosphate (FLEET) enema Place 1 enema rectally once as needed. For constipation if no results from Biscodyl suppository        Results for orders placed during the hospital encounter of 09/13/12 (from the past 48 hour(s))  GLUCOSE, CAPILLARY     Status: Abnormal   Collection Time   09/13/12 11:13 AM      Component Value Range Comment   Glucose-Capillary 167 (*) 70 - 99 mg/dL   APTT     Status: Abnormal   Collection Time   09/13/12 11:35 AM      Component Value Range Comment   aPTT 42 (*) 24 - 37 seconds   CBC     Status: Abnormal   Collection Time   09/13/12 11:35 AM      Component Value Range Comment   WBC 12.0 (*) 4.0 - 10.5 K/uL    RBC  2.03 (*) 4.22 - 5.81 MIL/uL    Hemoglobin 6.1 (*) 13.0 - 17.0 g/dL    HCT 21.3 (*) 08.6 - 52.0 %    MCV 92.6  78.0 - 100.0 fL    MCH 30.0  26.0 - 34.0 pg    MCHC 32.4  30.0 - 36.0 g/dL    RDW 57.8 (*) 46.9 - 15.5 %    Platelets 386  150 - 400 K/uL   COMPREHENSIVE METABOLIC PANEL     Status: Abnormal   Collection Time   09/13/12 11:35 AM      Component Value Range Comment   Sodium 136  135 - 145 mEq/L    Potassium 4.1  3.5 - 5.1 mEq/L    Chloride 99  96 - 112 mEq/L    CO2 29  19 - 32 mEq/L    Glucose, Bld 177 (*) 70 - 99 mg/dL    BUN 33 (*) 6 - 23 mg/dL    Creatinine, Ser 6.29  0.50 - 1.35 mg/dL    Calcium 8.4  8.4 - 52.8 mg/dL    Total Protein 6.8  6.0 - 8.3 g/dL    Albumin 2.0 (*) 3.5 - 5.2 g/dL    AST 10  0 - 37 U/L    ALT 5  0 - 53 U/L    Alkaline Phosphatase 76  39 - 117 U/L    Total Bilirubin 0.2 (*) 0.3 - 1.2 mg/dL    GFR calc non Af Amer 78 (*) >90 mL/min    GFR calc Af Amer >90  >90 mL/min   PROTIME-INR     Status: Normal   Collection Time   09/13/12 11:35 AM      Component Value Range Comment   Prothrombin Time 14.1  11.6 - 15.2 seconds    INR 1.10  0.00 - 1.49   PREPARE RBC (CROSSMATCH)     Status: Normal   Collection Time   09/13/12 12:45 PM      Component Value Range Comment   Order Confirmation ORDER PROCESSED BY BLOOD BANK     TYPE AND SCREEN     Status: Normal (Preliminary result)   Collection Time   09/13/12 12:45 PM      Component Value Range Comment   ABO/RH(D) O POS      Antibody Screen PENDING      Sample Expiration 09/16/2012  No results found.  Review of Systems  All other systems reviewed and are negative.    Blood pressure 115/59, pulse 79, temperature 97.9 F (36.6 C), temperature source Oral, resp. rate 18, SpO2 96.00%. Physical Exam  On examination patient has gangrenous changes of the left foot with osteomyelitis. Assessment/Plan Assessment: gangrene left foot.  Plan: Will plan for a transtibial amputation. Upon admissions  patient's hemoglobin had dropped to 6 and we will transfuse him stabilize the patient and plan for surgical intervention in the next day or 2.  Madox Corkins V 09/13/2012, 1:26 PM

## 2012-09-13 NOTE — Preoperative (Signed)
Beta Blockers   Reason not to administer Beta Blockers:Not Applicable 

## 2012-09-13 NOTE — Progress Notes (Signed)
Nurse called Edgardo Roys at [facility] for clarification on what medications were administered to patient today. Medication list updated. Also patient had knee high TED hose ordered. However patient has a right BKA and is having surgery on left lower extremity. No TED hose applied.

## 2012-09-13 NOTE — Progress Notes (Addendum)
Patient taken to OR holding. Lab called Nurse and stated that patients Hgb was 6.1. Nurse called, Dr. Gypsy Balsam (anesthesia), informed OR of this, and called Dr. Lajoyce Corners. Dr. Lajoyce Corners stated for patient to have type and cross, transfuse 2 units of packed RBC, and admit to hospital. Eunice Blase, RN in holding notified of this.   12:40  #18 ANGIO PLACED IN RIGHT MEDIAL FOREARM.Marland KitchenMarland KitchenSample of blood for T & C sent and 2 units requested.. Dr. Lajoyce Corners is suppose to come and see patient...Marland KitchenMarland KitchenDA

## 2012-09-14 ENCOUNTER — Encounter (HOSPITAL_COMMUNITY): Payer: Self-pay | Admitting: Certified Registered Nurse Anesthetist

## 2012-09-14 ENCOUNTER — Inpatient Hospital Stay (HOSPITAL_COMMUNITY): Payer: Medicare Other | Admitting: Certified Registered Nurse Anesthetist

## 2012-09-14 ENCOUNTER — Encounter (HOSPITAL_COMMUNITY)
Admission: RE | Disposition: A | Discharge status: Skilled Nursing Facility | Payer: Self-pay | Source: Ambulatory Visit | Attending: Orthopedic Surgery

## 2012-09-14 HISTORY — PX: AMPUTATION: SHX166

## 2012-09-14 LAB — GLUCOSE, CAPILLARY
Glucose-Capillary: 397 mg/dL — ABNORMAL HIGH (ref 70–99)
Glucose-Capillary: 402 mg/dL — ABNORMAL HIGH (ref 70–99)
Glucose-Capillary: 82 mg/dL (ref 70–99)
Glucose-Capillary: 86 mg/dL (ref 70–99)
Glucose-Capillary: 99 mg/dL (ref 70–99)

## 2012-09-14 LAB — BASIC METABOLIC PANEL
Chloride: 101 mEq/L (ref 96–112)
Creatinine, Ser: 0.77 mg/dL (ref 0.50–1.35)
GFR calc Af Amer: >90 mL/min (ref >90)
GFR calc non Af Amer: 83 mL/min — ABNORMAL LOW (ref >90)

## 2012-09-14 LAB — CBC
HCT: 27.7 % — ABNORMAL LOW (ref 39.0–52.0)
MCHC: 32.1 g/dL (ref 30.0–36.0)
Platelets: 381 10*3/uL (ref 150–400)
RDW: 16 % — ABNORMAL HIGH (ref 11.5–15.5)
WBC: 11 10*3/uL — ABNORMAL HIGH (ref 4.0–10.5)

## 2012-09-14 SURGERY — AMPUTATION BELOW KNEE
Anesthesia: General | Site: Leg Lower | Laterality: Left | Wound class: Dirty or Infected

## 2012-09-14 MED ORDER — OXYCODONE HCL 5 MG PO TABS: 5.0000 mg | ORAL_TABLET | Freq: Once | ORAL | Status: DC | PRN

## 2012-09-14 MED ORDER — SODIUM CHLORIDE 0.9 % IV SOLN
INTRAVENOUS | Status: DC
  Administered 2012-09-14: 20 mL/h via INTRAVENOUS

## 2012-09-14 MED ORDER — 0.9 % SODIUM CHLORIDE (POUR BTL) OPTIME
TOPICAL | Status: DC | PRN
  Administered 2012-09-14: 1000 mL

## 2012-09-14 MED ORDER — OXYCODONE-ACETAMINOPHEN 5-325 MG PO TABS
1.0000 | ORAL_TABLET | ORAL | Status: DC | PRN
  Administered 2012-09-15 – 2012-09-16 (×3): 1 via ORAL
  Filled 2012-09-14 (×3): qty 1

## 2012-09-14 MED ORDER — LIDOCAINE HCL (CARDIAC) 20 MG/ML IV SOLN
INTRAVENOUS | Status: DC | PRN
  Administered 2012-09-14: 50 mg via INTRAVENOUS

## 2012-09-14 MED ORDER — ONDANSETRON HCL 4 MG PO TABS: 4.0000 mg | ORAL_TABLET | Freq: Four times a day (QID) | ORAL | Status: DC | PRN

## 2012-09-14 MED ORDER — CEFAZOLIN SODIUM 1-5 GM-% IV SOLN
1.0000 g | Freq: Four times a day (QID) | INTRAVENOUS | Status: AC
  Administered 2012-09-14 – 2012-09-15 (×3): 1 g via INTRAVENOUS
  Filled 2012-09-14 (×3): qty 50

## 2012-09-14 MED ORDER — METOCLOPRAMIDE HCL 5 MG/ML IJ SOLN
5.0000 mg | Freq: Three times a day (TID) | INTRAMUSCULAR | Status: DC | PRN
  Administered 2012-09-15 – 2012-09-16 (×3): 10 mg via INTRAVENOUS
  Filled 2012-09-14 (×3): qty 2

## 2012-09-14 MED ORDER — MIDAZOLAM HCL 5 MG/5ML IJ SOLN
INTRAMUSCULAR | Status: DC | PRN
  Administered 2012-09-14: 1 mg via INTRAVENOUS

## 2012-09-14 MED ORDER — HYDROMORPHONE HCL PF 1 MG/ML IJ SOLN: 0.2500 mg | INTRAMUSCULAR | Status: DC | PRN

## 2012-09-14 MED ORDER — PROPOFOL 10 MG/ML IV BOLUS
INTRAVENOUS | Status: DC | PRN
  Administered 2012-09-14: 100 mg via INTRAVENOUS

## 2012-09-14 MED ORDER — HYDROCODONE-ACETAMINOPHEN 5-325 MG PO TABS
1.0000 | ORAL_TABLET | ORAL | Status: DC | PRN
  Administered 2012-09-14 – 2012-09-15 (×3): 1 via ORAL
  Filled 2012-09-14 (×3): qty 1

## 2012-09-14 MED ORDER — ONDANSETRON HCL 4 MG/2ML IJ SOLN
INTRAMUSCULAR | Status: DC | PRN
  Administered 2012-09-14: 4 mg via INTRAVENOUS

## 2012-09-14 MED ORDER — DEXTROSE 5 % IV SOLN
3.0000 g | INTRAVENOUS | Status: DC | PRN
  Administered 2012-09-14: 2 g via INTRAVENOUS

## 2012-09-14 MED ORDER — HYDROMORPHONE HCL PF 1 MG/ML IJ SOLN
0.5000 mg | INTRAMUSCULAR | Status: DC | PRN
  Administered 2012-09-15 – 2012-09-16 (×2): 1 mg via INTRAVENOUS
  Filled 2012-09-14 (×2): qty 1

## 2012-09-14 MED ORDER — MIDAZOLAM HCL 2 MG/2ML IJ SOLN: 0.5000 mg | Freq: Once | INTRAMUSCULAR | Status: DC | PRN

## 2012-09-14 MED ORDER — WARFARIN - PHARMACIST DOSING INPATIENT: Freq: Every day | Status: DC

## 2012-09-14 MED ORDER — LACTATED RINGERS IV SOLN
INTRAVENOUS | Status: DC | PRN
  Administered 2012-09-14 (×2): via INTRAVENOUS

## 2012-09-14 MED ORDER — FENTANYL CITRATE 0.05 MG/ML IJ SOLN
INTRAMUSCULAR | Status: DC | PRN
  Administered 2012-09-14: 50 ug via INTRAVENOUS

## 2012-09-14 MED ORDER — METOCLOPRAMIDE HCL 10 MG PO TABS: 5.0000 mg | ORAL_TABLET | Freq: Three times a day (TID) | ORAL | Status: DC | PRN

## 2012-09-14 MED ORDER — WARFARIN SODIUM 5 MG PO TABS
5.0000 mg | ORAL_TABLET | Freq: Once | ORAL | Status: AC
  Administered 2012-09-14: 5 mg via ORAL
  Filled 2012-09-14: qty 1

## 2012-09-14 MED ORDER — EPHEDRINE SULFATE 50 MG/ML IJ SOLN
INTRAMUSCULAR | Status: DC | PRN
  Administered 2012-09-14: 5 mg via INTRAVENOUS

## 2012-09-14 MED ORDER — MEPERIDINE HCL 25 MG/ML IJ SOLN: 6.2500 mg | INTRAMUSCULAR | Status: DC | PRN

## 2012-09-14 MED ORDER — OXYCODONE HCL 5 MG/5ML PO SOLN: 5.0000 mg | Freq: Once | ORAL | Status: DC | PRN

## 2012-09-14 MED ORDER — ONDANSETRON HCL 4 MG/2ML IJ SOLN
4.0000 mg | Freq: Four times a day (QID) | INTRAMUSCULAR | Status: DC | PRN
  Administered 2012-09-15: 4 mg via INTRAVENOUS
  Filled 2012-09-14: qty 2

## 2012-09-14 MED ORDER — PROMETHAZINE HCL 25 MG/ML IJ SOLN: 6.2500 mg | INTRAMUSCULAR | Status: DC | PRN

## 2012-09-14 SURGICAL SUPPLY — 45 items
BANDAGE ESMARK 6X9 LF (GAUZE/BANDAGES/DRESSINGS) ×1 IMPLANT
BANDAGE GAUZE ELAST BULKY 4 IN (GAUZE/BANDAGES/DRESSINGS) ×3 IMPLANT
BLADE SAW RECIP 87.9 MT (BLADE) ×2 IMPLANT
BLADE SURG 21 STRL SS (BLADE) ×2 IMPLANT
BNDG CMPR 9X6 STRL LF SNTH (GAUZE/BANDAGES/DRESSINGS) ×1
BNDG COHESIVE 6X5 TAN STRL LF (GAUZE/BANDAGES/DRESSINGS) ×3 IMPLANT
BNDG ESMARK 6X9 LF (GAUZE/BANDAGES/DRESSINGS) ×2
CLOTH BEACON ORANGE TIMEOUT ST (SAFETY) ×2 IMPLANT
COVER SURGICAL LIGHT HANDLE (MISCELLANEOUS) ×2 IMPLANT
CUFF TOURNIQUET SINGLE 34IN LL (TOURNIQUET CUFF) IMPLANT
CUFF TOURNIQUET SINGLE 44IN (TOURNIQUET CUFF) IMPLANT
DRAIN PENROSE 1/2X12 LTX STRL (WOUND CARE) IMPLANT
DRAPE EXTREMITY T 121X128X90 (DRAPE) ×2 IMPLANT
DRAPE PROXIMA HALF (DRAPES) ×4 IMPLANT
DRAPE U-SHAPE 47X51 STRL (DRAPES) ×4 IMPLANT
DRSG ADAPTIC 3X8 NADH LF (GAUZE/BANDAGES/DRESSINGS) ×2 IMPLANT
DRSG PAD ABDOMINAL 8X10 ST (GAUZE/BANDAGES/DRESSINGS) ×2 IMPLANT
DURAPREP 26ML APPLICATOR (WOUND CARE) ×2 IMPLANT
ELECT REM PT RETURN 9FT ADLT (ELECTROSURGICAL) ×2
ELECTRODE REM PT RTRN 9FT ADLT (ELECTROSURGICAL) ×1 IMPLANT
EVACUATOR 1/8 PVC DRAIN (DRAIN) IMPLANT
GLOVE BIOGEL PI IND STRL 9 (GLOVE) ×1 IMPLANT
GLOVE BIOGEL PI INDICATOR 9 (GLOVE) ×1
GLOVE SURG ORTHO 9.0 STRL STRW (GLOVE) ×2 IMPLANT
GOWN PREVENTION PLUS XLARGE (GOWN DISPOSABLE) ×2 IMPLANT
GOWN SRG XL XLNG 56XLVL 4 (GOWN DISPOSABLE) ×1 IMPLANT
GOWN STRL NON-REIN XL XLG LVL4 (GOWN DISPOSABLE) ×2
KIT BASIN OR (CUSTOM PROCEDURE TRAY) ×2 IMPLANT
KIT ROOM TURNOVER OR (KITS) ×2 IMPLANT
MANIFOLD NEPTUNE II (INSTRUMENTS) ×2 IMPLANT
NS IRRIG 1000ML POUR BTL (IV SOLUTION) ×2 IMPLANT
PACK GENERAL/GYN (CUSTOM PROCEDURE TRAY) ×2 IMPLANT
PAD ARMBOARD 7.5X6 YLW CONV (MISCELLANEOUS) ×4 IMPLANT
SPONGE GAUZE 4X4 12PLY (GAUZE/BANDAGES/DRESSINGS) ×2 IMPLANT
SPONGE LAP 18X18 X RAY DECT (DISPOSABLE) IMPLANT
STAPLER VISISTAT 35W (STAPLE) IMPLANT
STOCKINETTE IMPERVIOUS LG (DRAPES) ×2 IMPLANT
SUT PDS AB 1 CT  36 (SUTURE)
SUT PDS AB 1 CT 36 (SUTURE) IMPLANT
SUT SILK 2 0 (SUTURE) ×2
SUT SILK 2-0 18XBRD TIE 12 (SUTURE) ×1 IMPLANT
TOWEL OR 17X24 6PK STRL BLUE (TOWEL DISPOSABLE) ×2 IMPLANT
TOWEL OR 17X26 10 PK STRL BLUE (TOWEL DISPOSABLE) ×2 IMPLANT
TUBE ANAEROBIC SPECIMEN COL (MISCELLANEOUS) IMPLANT
WATER STERILE IRR 1000ML POUR (IV SOLUTION) ×2 IMPLANT

## 2012-09-14 NOTE — Transfer of Care (Signed)
Immediate Anesthesia Transfer of Care Note  Patient: [name]  Procedure(s) Performed: Procedure(s) (LRB) with comments: AMPUTATION BELOW KNEE (Left)  Patient Location: PACU  Anesthesia Type:General  Level of Consciousness: awake, alert  and oriented  Airway & Oxygen Therapy: Patient Spontanous Breathing  Post-op Assessment: Report given to PACU RN  Post vital signs: stable  Complications: No apparent anesthesia complications

## 2012-09-14 NOTE — Op Note (Signed)
OPERATIVE REPORT  DATE OF SURGERY: 09/14/2012  PATIENT:  [name],  79 y.o. male  PRE-OPERATIVE DIAGNOSIS:  gangrene  POST-OPERATIVE DIAGNOSIS:  left foot gangrene  PROCEDURE:  Procedure(s): AMPUTATION BELOW KNEE left  SURGEON:  Surgeon(s): Nadara Mustard, MD  ANESTHESIA:   general  EBL:  Minimal ML  SPECIMEN:  Source of Specimen:  left leg  TOURNIQUET:   Total Tourniquet Time Documented: Thigh (Left) - 9 minutes  PROCEDURE DETAILS: Patient is a 79 year old gentleman with severe peripheral vascular disease who is status post a right transtibial amputation and presents at this time with a gangrenous left foot failure of conservative treatment and presents for a left transtibial amputation. Risks and benefits were discussed with the patient and the family including infection neurovascular injury nonhealing of the incision need for higher level amputation. Patient family state to understand and wish to proceed at this time. Should procedure patient brought to the operating room and underwent a general anesthetic. After adequate levels of anesthesia were obtained patient's left lower extremity was prepped using DuraPrep draped into a sterile field. The foot was draped out of sterile field with an impervious stockinette. A transverse incision was made 11 cm distal the tibial tubercle this curved proximally and a large posterior flap was created. The tibia was transected 1 cm proximal skin incision was beveled anteriorly the fibula was transected just proximal to the tibial incision. Amputation knife was used to create a large posterior flap. The sciatic nerve was pulled cut and allowed to retract. The vascular bundles were suture ligated with 2-0 silk. The tourniquet was deflated hemostasis was obtained. The deep and superficial fascial layers were closed using #1 PDS the skin was closed using staples the wound was covered with Adaptic orthopedic sponges AB dressing Kerlix and Coban. Patient  was extubated taken to the PACU in stable condition.  PLAN OF CARE: Admit to inpatient   PATIENT DISPOSITION:  PACU - hemodynamically stable.   Nadara Mustard, MD 09/14/2012 12:01 PM

## 2012-09-14 NOTE — Progress Notes (Signed)
Patient returned from OR/PACU  S/P LBKA. Coban dressing on, dry and intact. Elevated on pillow for comfort. V/S stable. Patient denies any discomfort at this time.

## 2012-09-14 NOTE — Anesthesia Postprocedure Evaluation (Signed)
  Anesthesia Post-op Note  Patient: [name]  Procedure(s) Performed: Procedure(s) (LRB) with comments: AMPUTATION BELOW KNEE (Left)  Patient Location: PACU  Anesthesia Type:General  Level of Consciousness: awake, alert , oriented and patient cooperative  Airway and Oxygen Therapy: Patient Spontanous Breathing and Patient connected to nasal cannula oxygen  Post-op Pain: none  Post-op Assessment: Post-op Vital signs reviewed, Patient's Cardiovascular Status Stable, Respiratory Function Stable, Patent Airway, No signs of Nausea or vomiting and Pain level controlled  Post-op Vital Signs: Reviewed and stable  Complications: No apparent anesthesia complications

## 2012-09-14 NOTE — Anesthesia Preprocedure Evaluation (Addendum)
Anesthesia Evaluation  Patient identified by MRN, date of birth, ID band Patient awake    Reviewed: Allergy & Precautions, H&P , NPO status , Patient's Chart, lab work & pertinent test results, reviewed documented beta blocker date and time   History of Anesthesia Complications Negative for: history of anesthetic complications  Airway Mallampati: I TM Distance: >3 FB Neck ROM: Full    Dental  (+) Edentulous Upper, Dental Advisory Given, Missing and Poor Dentition   Pulmonary COPDformer smoker (quit 4 years ago),  breath sounds clear to auscultation  Pulmonary exam normal       Cardiovascular hypertension, Pt. on medications and Pt. on home beta blockers + Past MI and + Peripheral Vascular Disease Rhythm:Regular Rate:Normal  8/13 echo diffuse hypokinesis, EF 40-45%, valves ok 2012: stress test fixed inferior infarct EF 51% no ischemia   Neuro/Psych negative neurological ROS  negative psych ROS   GI/Hepatic Neg liver ROS, GERD-  Medicated and Controlled,  Endo/Other  diabetes, Poorly Controlled, Type 1, Insulin Dependent and Oral Hypoglycemic AgentsHypothyroidism Gluc 82  Renal/GU negative Renal ROS     Musculoskeletal negative musculoskeletal ROS (+)   Abdominal (+)  Abdomen: soft. Bowel sounds: normal.  Peds  Hematology  (+) Blood dyscrasia, anemia , hgb 8.9   Anesthesia Other Findings Pt admitted 04/04/12 with DKA, ARF, sepsis and gangrenous wounds/ cellulitis on left foot.  Followed by Dr. Edilia Bo.  07/29/12 aortagram and lower extremity angiogram.  Echo from 04/05/12 showed EF on 40-45%.    Reproductive/Obstetrics                      Anesthesia Physical Anesthesia Plan  ASA: III  Anesthesia Plan: General   Post-op Pain Management:    Induction: Intravenous  Airway Management Planned: LMA  Additional Equipment:   Intra-op Plan:   Post-operative Plan:   Informed Consent: I have  reviewed the patients History and Physical, chart, labs and discussed the procedure including the risks, benefits and alternatives for the proposed anesthesia with the patient or authorized representative who has indicated his/her understanding and acceptance.   Dental advisory given  Plan Discussed with: CRNA and Surgeon  Anesthesia Plan Comments: (Plan routine monitors, GA- LMA OK)        Anesthesia Quick Evaluation

## 2012-09-14 NOTE — Progress Notes (Signed)
ANTICOAGULATION CONSULT NOTE - Initial Consult  Pharmacy Consult for coumadin Indication: VTE prophylaxis  Allergies  Allergen Reactions  . Bactrim (Sulfamethoxazole W-Trimethoprim) Swelling    Lips, eyes, cheeks swollen  . Doxycycline Swelling    Lips, eyes, cheeks were all swollen    Patient Measurements: Height: 6' (182.9 cm) Weight: 140 lb (63.504 kg) IBW/kg (Calculated) : 77.6  Heparin Dosing Weight:   Vital Signs: Temp: 97.6 F (36.4 C) (01/11 1200) BP: 129/50 mmHg (01/11 1200) Pulse Rate: 57  (01/11 1200)  Labs:  Basename 09/14/12 0650 09/13/12 1135  HGB 8.9* 6.1*  HCT 27.7* 18.8*  PLT 381 386  APTT -- 42*  LABPROT -- 14.1  INR -- 1.10  HEPARINUNFRC -- --  CREATININE 0.77 0.88  CKTOTAL -- --  CKMB -- --  TROPONINI -- --    Estimated Creatinine Clearance: 65 ml/min (by C-G formula based on Cr of 0.77).   Medical History: Past Medical History  Diagnosis Date  . Diabetes mellitus   . Thyroid disease   . Anemia   . Hyperlipidemia   . Neuropathy, diabetic   . Diabetic retinopathy   . GERD (gastroesophageal reflux disease)   . Arthritis   . Urgency of urination   . Hypothyroidism   . Sacral wound     hx of  . Cancer   . Diabetic retinopathy   . Peripheral vascular disease     Medications:  Scheduled:    .  ceFAZolin (ANCEF) IV  1 g Intravenous Q6H  . [COMPLETED] sodium phosphate  1 enema Rectal Once  . warfarin  5 mg Oral ONCE-1800  . Warfarin - Pharmacist Dosing Inpatient   Does not apply q1800  . [DISCONTINUED] aspirin  81 mg Oral Daily  . [DISCONTINUED]  ceFAZolin (ANCEF) IV  2 g Intravenous On Call to OR  . [DISCONTINUED]  ceFAZolin (ANCEF) IV  2 g Intravenous On Call to OR  . [DISCONTINUED] chlorhexidine  60 mL Topical Once  . [DISCONTINUED] docusate sodium  100 mg Oral BID  . [DISCONTINUED] ferrous sulfate  325 mg Oral Q breakfast  . [DISCONTINUED] insulin aspart  0-15 Units Subcutaneous TID WC  . [DISCONTINUED] insulin aspart  4  Units Subcutaneous TID WC  . [DISCONTINUED] insulin glargine  10 Units Subcutaneous Daily  . [DISCONTINUED] insulin glargine  10 Units Subcutaneous QHS  . [DISCONTINUED] levothyroxine  88 mcg Oral QAC breakfast  . [DISCONTINUED] metoprolol tartrate  12.5 mg Oral BID  . [DISCONTINUED] mirtazapine  7.5 mg Oral QHS  . [DISCONTINUED] pantoprazole  40 mg Oral Daily   Infusions:    . sodium chloride 20 mL/hr (09/14/12 1226)  . [DISCONTINUED] sodium chloride 50 mL/hr at 09/13/12 1902    Assessment: 79 yo male s/p LBKA will be put on coumadin.  Coumadin score = 3.  INR on 01/10 was 1.1 Goal of Therapy:  INR 2-3    Plan:  1) Coumadin 5mg  po x1 2) Daily PT/INR  Margaretha Mahan, Tsz-Yin 09/14/2012,12:44 PM

## 2012-09-14 NOTE — Anesthesia Postprocedure Evaluation (Signed)
  Anesthesia Post-op Note  Patient: [name]  Procedure(s) Performed: Procedure(s) (LRB) with comments: AMPUTATION BELOW KNEE (Left)  Patient Location: PACU  Anesthesia Type:General  Level of Consciousness: awake, alert  and oriented  Airway and Oxygen Therapy: Patient Spontanous Breathing  Post-op Pain: none  Post-op Assessment: Post-op Vital signs reviewed  Post-op Vital Signs: stable  Complications: No apparent anesthesia complications

## 2012-09-14 NOTE — Progress Notes (Signed)
Resident is transported to OR at this time. Alert and oriented x3. V/S stable. Denies any discomfort.

## 2012-09-14 NOTE — Progress Notes (Signed)
Patient ID: [name], male   DOB: 1928-10-25, 79 y.o.   MRN: [id number] Patient received 2 units packed red blood cells yesterday. CBC drawn yet this morning. Plan for transtibial amputation on the left later this morning.

## 2012-09-15 LAB — GLUCOSE, CAPILLARY
Glucose-Capillary: 427 mg/dL — ABNORMAL HIGH (ref 70–99)
Glucose-Capillary: 428 mg/dL — ABNORMAL HIGH (ref 70–99)

## 2012-09-15 LAB — PROTIME-INR: Prothrombin Time: 14.3 seconds (ref 11.6–15.2)

## 2012-09-15 MED ORDER — WARFARIN SODIUM 7.5 MG PO TABS
7.5000 mg | ORAL_TABLET | Freq: Once | ORAL | Status: AC
  Administered 2012-09-15: 7.5 mg via ORAL
  Filled 2012-09-15: qty 1

## 2012-09-15 NOTE — Progress Notes (Signed)
Patient c/o nausea. Zofran given as ordered.

## 2012-09-15 NOTE — Clinical Social Work Psychosocial (Signed)
Clinical Social Work Department BRIEF PSYCHOSOCIAL ASSESSMENT 09/15/2012  Patient:  [name],[name]     Account Number:  [id number]     Admit date:  09/13/2012  Clinical Social Worker:  Skip Mayer  Date/Time:  09/15/2012 11:00 AM  Referred by:  Physician  Date Referred:  09/15/2012 Referred for  SNF Placement   Other Referral:   Interview type:  Family Other interview type:    PSYCHOSOCIAL DATA Living Status:  FACILITY Admitted from facility:   Level of care:  Skilled Nursing Facility Primary support name:  [name] Primary support relationship to patient:  CHILD, ADULT Degree of support available:   Adequate    CURRENT CONCERNS Current Concerns  Post-Acute Placement   Other Concerns:    SOCIAL WORK ASSESSMENT / PLAN CSW spoke with pt's dtr, [name], re: d/c plan.  Pt admitted from [facility] and plan is for him to return at d/c per pt's dtr.  Pt's dtr reports she will be going out of town today and requests CSW notify pt's [name] [phone], when pt d/c.  CSW completed FL2 and place on paper chart for MD signature.  Weekday CSW to f/u and assist with return to [place].   Assessment/plan status:  Information/Referral to [facility] Other assessment/ plan:   Information/referral to community resources:   PTAR  SNF    PATIENT'S/FAMILY'S RESPONSE TO PLAN OF CARE: Pt asleep at time of visit.  Pt's dtr reports pt is long term resident at [facility] and is agreeable to his return.  Pt's dtr verbalized understanding of d/c plan and appreciation for CSW assist.        [name], MSW, LCSWA [phone] (Weekends 8:00am-4:30pm)

## 2012-09-15 NOTE — Progress Notes (Signed)
Patient vomited undigested food, about 60cc x1. Reglan IV given as ordered. No further c/o at this time. In bed resting.

## 2012-09-15 NOTE — Progress Notes (Signed)
Patient ID: [name], male   DOB: [birth year], 79 y.o.   MRN: [id number] Patient comfortable this morning. Plan for discharge back to skilled nursing facility on Monday.

## 2012-09-15 NOTE — Evaluation (Signed)
Physical Therapy Evaluation Patient Details Name: [name] MRN: [id number] DOB: 1929/06/10 Today's Date: 09/15/2012 Time: 6295-2841 PT Time Calculation (min): 29 min  PT Assessment / Plan / Recommendation Clinical Impression  79 yo now bilateral BKAs presents with new deficits in mobility specifically for transferring bed to chair.  Unable to assess transfers this visit due to patient urinating frequently and covered in urine/feces on arrival so spent session assisting with personal care and assessing bed mobility.  Expect pt will do very well when he returns to SNF and continues with therapy services there.  Will keep on acute services pending confirmation of this plan to assist with slideboard transfer training and wheelchair mobility.      PT Assessment  Patient needs continued PT services    Follow Up Recommendations  SNF    Does the patient have the potential to tolerate intense rehabilitation      Barriers to Discharge None (back to snf)      Equipment Recommendations   (recommend SNF reassess w/c for appropriate cushion)    Recommendations for Other Services     Frequency Min 3X/week    Precautions / Restrictions Precautions Precautions: Fall Precaution Comments: Bilateral BKAs Restrictions Weight Bearing Restrictions: Yes LLE Weight Bearing: Non weight bearing Other Position/Activity Restrictions: post amputation positioning including elevate off bed   Pertinent Vitals/Pain Minimal pain reported or demonstrated.  Moves self well in bed       Mobility  Bed Mobility Bed Mobility: Rolling Right;Rolling Left;Scooting to [facility] Rolling Right: 4: Min assist;With rail Rolling Left: 4: Min assist;With rail Scooting to [facility]: 2: Max assist;With rail Details for Bed Mobility Assistance: assist to move to full lateral decubitus positioning for pericare and to change urine soaked linens Transfers Transfers: Not assessed (pt refused further mobilty due to frequent  urination) Ambulation/Gait Ambulation/Gait Assistance: Not tested (comment) (non-ambulatory) Stairs: No Wheelchair Mobility Wheelchair Mobility: No    Shoulder Instructions     Exercises     PT Diagnosis: Other (comment)  PT Problem List: Decreased mobility;Decreased strength PT Treatment Interventions: Functional mobility training;Therapeutic activities;Therapeutic exercise;Wheelchair mobility training   PT Goals Acute Rehab PT Goals PT Goal Formulation: With patient Time For Goal Achievement: 09/29/12 Potential to Achieve Goals: Good Pt will go Supine/Side to Sit: with supervision;with HOB 0 degrees;with rail PT Goal: Supine/Side to Sit - Progress: Goal set today Pt will Sit at Edge of Bed: with supervision;with no upper extremity support;1-2 min;6-10 min (to set up for transfer; to perform feeding/grooming) PT Goal: Sit at Edge Of Bed - Progress: Goal set today Pt will Transfer Bed to Chair/Chair to Bed: with min assist (using slideboard to wheelchair) PT Transfer Goal: Bed to Chair/Chair to Bed - Progress: Goal set today Pt will Propel Wheelchair: > 150 feet;with supervision PT Goal: Propel Wheelchair - Progress: Goal set today  Visit Information  Last PT Received On: 09/15/12 Assistance Needed: +1    Subjective Data  Subjective: I need a new gown.  I've covered in  Patient Stated Goal: Not have to need somebody's help to get out of bed   Prior Functioning  Home Living Lives With: Other (Comment) (SNF) Available Help at Discharge: Skilled Nursing Facility Type of Home: Skilled Nursing Facility Prior Function Level of Independence: Needs assistance Needs Assistance: Transfers Transfer Assistance: supervision with sliding board at SNF with therapy (otherwise lifted OOB at SNF) Able to Take Stairs?: No Driving: No Comments: per patient able to wheel self 'anywhere i want to go' in wheelchair  prior to this admission Communication Communication: No difficulties      Cognition  Overall Cognitive Status: Appears within functional limits for tasks assessed/performed Arousal/Alertness: Awake/alert Orientation Level: Appears intact for tasks assessed Behavior During Session: [facility] for tasks performed    Extremity/Trunk Assessment Right Lower Extremity Assessment RLE ROM/Strength/Tone: Unable to fully assess;WFL for tasks assessed Left Lower Extremity Assessment LLE ROM/Strength/Tone: Unable to fully assess;WFL for tasks assessed   Balance    End of Session PT - End of Session Activity Tolerance: Patient tolerated treatment well Patient left: in bed;with call bell/phone within reach (HOB >45 degrees and eating lunch) Nurse Communication:  (please empty urinal so pt does not spill on self)  GP     Dennis Bast 09/15/2012, 12:44 PM

## 2012-09-15 NOTE — Progress Notes (Signed)
ANTICOAGULATION CONSULT NOTE - Follow Up Consult  Pharmacy Consult for coumadin Indication: VTE prophylaxis  Allergies  Allergen Reactions  . Bactrim (Sulfamethoxazole W-Trimethoprim) Swelling    Lips, eyes, cheeks swollen  . Doxycycline Swelling    Lips, eyes, cheeks were all swollen    Patient Measurements: Height: 6' (182.9 cm) Weight: 140 lb (63.504 kg) IBW/kg (Calculated) : 77.6  Heparin Dosing Weight:   Vital Signs: Temp: 98.9 F (37.2 C) (01/12 0604) BP: 125/50 mmHg (01/12 0604) Pulse Rate: 73  (01/12 0604)  Labs:  Basename 09/15/12 0555 09/14/12 0650 09/13/12 1135  HGB -- 8.9* 6.1*  HCT -- 27.7* 18.8*  PLT -- 381 386  APTT -- -- 42*  LABPROT 14.3 -- 14.1  INR 1.13 -- 1.10  HEPARINUNFRC -- -- --  CREATININE -- 0.77 0.88  CKTOTAL -- -- --  CKMB -- -- --  TROPONINI -- -- --    Estimated Creatinine Clearance: 65 ml/min (by C-G formula based on Cr of 0.77).   Medications:  Scheduled:    . [COMPLETED]  ceFAZolin (ANCEF) IV  1 g Intravenous Q6H  . [COMPLETED] warfarin  5 mg Oral ONCE-1800  . warfarin  7.5 mg Oral ONCE-1800  . Warfarin - Pharmacist Dosing Inpatient   Does not apply q1800  . [DISCONTINUED] aspirin  81 mg Oral Daily  . [DISCONTINUED] chlorhexidine  60 mL Topical Once  . [DISCONTINUED] docusate sodium  100 mg Oral BID  . [DISCONTINUED] ferrous sulfate  325 mg Oral Q breakfast  . [DISCONTINUED] insulin aspart  0-15 Units Subcutaneous TID WC  . [DISCONTINUED] insulin aspart  4 Units Subcutaneous TID WC  . [DISCONTINUED] insulin glargine  10 Units Subcutaneous QHS  . [DISCONTINUED] levothyroxine  88 mcg Oral QAC breakfast  . [DISCONTINUED] metoprolol tartrate  12.5 mg Oral BID  . [DISCONTINUED] mirtazapine  7.5 mg Oral QHS  . [DISCONTINUED] pantoprazole  40 mg Oral Daily   Infusions:    . [DISCONTINUED] sodium chloride 50 mL/hr at 09/13/12 1902  . [DISCONTINUED] sodium chloride 20 mL/hr (09/14/12 1226)    Assessment: 79 yo male s/p LBKA  is currently on subtherapeutic coumadin.  INR did not move much after 5mg  dose yesterday; INR today is 1.13. Goal of Therapy:  INR 2-3    Plan:  1) Coumadin 7.5mg  po x1 2) INR in am  Areona Homer, Tsz-Yin 09/15/2012,10:52 AM

## 2012-09-16 ENCOUNTER — Encounter (HOSPITAL_COMMUNITY): Payer: Self-pay | Admitting: Orthopedic Surgery

## 2012-09-16 LAB — GLUCOSE, CAPILLARY
Glucose-Capillary: 329 mg/dL — ABNORMAL HIGH (ref 70–99)
Glucose-Capillary: 460 mg/dL — ABNORMAL HIGH (ref 70–99)

## 2012-09-16 MED ORDER — WARFARIN SODIUM 1 MG PO TABS: 1.0000 mg | ORAL_TABLET | Freq: Every day | ORAL | Status: DC

## 2012-09-16 MED ORDER — OXYCODONE-ACETAMINOPHEN 5-325 MG PO TABS: 1.0000 | ORAL_TABLET | ORAL | Status: DC | PRN

## 2012-09-16 MED FILL — Cefazolin Sodium for IV Soln 2 GM and Dextrose 3% (50 ML): INTRAVENOUS | Qty: 50 | Status: AC

## 2012-09-16 NOTE — Progress Notes (Signed)
Utilization review completed. Mckenlee Mangham, RN, BSN. 

## 2012-09-16 NOTE — Progress Notes (Signed)
Patient for d/c today to SNF bed at [facility] as prior to admission- awaiting confirmation from SNF-Family agreeable to this plan- will plan transfer via EMS. Reece Levy, MSW, Theresia Majors [phone]

## 2012-09-16 NOTE — Discharge Summary (Signed)
Physician Discharge Summary  Patient ID: [name] MRN: [id number] DOB/AGE: 13-Aug-1929 79 y.o.  Admit date: 09/13/2012 Discharge date: 09/16/2012  Admission Diagnoses: Osteomyelitis abscess gangrene left foot Discharge Diagnoses: Same Active Problems:  * No active hospital problems. *    Discharged Condition: stable  Hospital Course: Patient's hospital course was essentially unremarkable. Patient was admitted had a hemoglobin of 6 he was admitted and received 2 units of packed red blood cells which brought his hemoglobin up to a. Patient was then felt to be safe for surgical intervention he underwent a transtibial amputation. Postoperatively patient progressed well and was discharged back to skilled nursing.  Consults: None  Significant Diagnostic Studies: labs: Routine labs  Treatments: surgery: Left transtibial amputation. and transfusion 2 units packed red blood cells.  Discharge Exam: Blood pressure 132/87, pulse 55, temperature 97.9 F (36.6 C), temperature source Oral, resp. rate 18, height 6' (1.829 m), weight 63.504 kg (140 lb), SpO2 100.00%. Incision/Wound: dressing clean dry and intact.  Disposition: 01-Home or Self Care  Discharge Orders    Future Orders Please Complete By Expires   Diet - low sodium heart healthy      Call MD / Call 911      Comments:   If you experience chest pain or shortness of breath, CALL 911 and be transported to the hospital emergency room.  If you develope a fever above 101 F, pus (white drainage) or increased drainage or redness at the wound, or calf pain, call your surgeon's office.   Constipation Prevention      Comments:   Drink plenty of fluids.  Prune juice may be helpful.  You may use a stool softener, such as Colace (over the counter) 100 mg twice a day.  Use MiraLax (over the counter) for constipation as needed.   Increase activity slowly as tolerated          Medication List     As of 09/16/2012  6:33 AM    TAKE these  medications         albuterol (2.5 MG/3ML) 0.083% nebulizer solution   Commonly known as: PROVENTIL   Take 2.5 mg by nebulization every 2 (two) hours as needed. For wheezing/shortness of breath      aspirin 81 MG chewable tablet   Chew 1 tablet (81 mg total) by mouth daily.      bisacodyl 10 MG suppository   Commonly known as: DULCOLAX   Place 10 mg rectally daily as needed. For constipation If no relief from Milk Of Magnesia      docusate sodium 100 MG capsule   Commonly known as: COLACE   Take 100 mg by mouth 2 (two) times daily.      ferrous sulfate 325 (65 FE) MG tablet   Take 325 mg by mouth daily with breakfast.      insulin glargine 100 UNIT/ML injection   Commonly known as: LANTUS   Inject 10 Units into the skin daily.      insulin NPH-insulin regular (70-30) 100 UNIT/ML injection   Commonly known as: NOVOLIN 70/30   Inject 20 Units into the skin daily with breakfast.      ipratropium 0.02 % nebulizer solution   Commonly known as: ATROVENT   Take 0.5 mg by nebulization every 2 (two) hours as needed. For shortness of breath      levothyroxine 88 MCG tablet   Commonly known as: SYNTHROID, LEVOTHROID   Take 88 mcg by mouth daily.  metoprolol tartrate 25 MG tablet   Commonly known as: LOPRESSOR   Take 12.5 mg by mouth 2 (two) times daily.      MILK OF MAGNESIA PO   Take 30 mLs by mouth as needed.      mirtazapine 15 MG tablet   Commonly known as: REMERON   Take 7.5 mg by mouth at bedtime.      omeprazole 20 MG capsule   Commonly known as: PRILOSEC   Take 20 mg by mouth 2 (two) times daily.      oxyCODONE-acetaminophen 5-325 MG per tablet   Commonly known as: PERCOCET/ROXICET   Take 1 tablet by mouth every 4 (four) hours as needed for pain.      PROSTAT PO   Take 32 mLs by mouth 2 (two) times daily.      sodium phosphate enema   Commonly known as: FLEET   Place 1 enema rectally once as needed. For constipation if no results from Biscodyl suppository        warfarin 1 MG tablet   Commonly known as: COUMADIN   Take 1 tablet (1 mg total) by mouth daily.           Follow-up Information    Follow up with DUDA,MARCUS V, MD. In 3 weeks.   Contact information:   [address] [place] [address] [phone]          Signed: Nadara Mustard 09/16/2012, 6:33 AM

## 2012-09-17 LAB — TYPE AND SCREEN
Antibody Screen: NEGATIVE
Unit division: 0
Unit division: 0
Unit division: 0

## 2012-10-21 ENCOUNTER — Other Ambulatory Visit (HOSPITAL_COMMUNITY): Payer: Self-pay | Admitting: Orthopedic Surgery

## 2012-10-22 ENCOUNTER — Encounter (HOSPITAL_COMMUNITY): Payer: Self-pay

## 2012-10-22 MED ORDER — CEFAZOLIN SODIUM-DEXTROSE 2-3 GM-% IV SOLR
2.0000 g | INTRAVENOUS | Status: AC
  Administered 2012-10-23: 2 g via INTRAVENOUS

## 2012-10-23 ENCOUNTER — Encounter (HOSPITAL_COMMUNITY)
Admission: RE | Disposition: A | Discharge status: Skilled Nursing Facility | Payer: Self-pay | Source: Ambulatory Visit | Attending: Orthopedic Surgery

## 2012-10-23 ENCOUNTER — Encounter (HOSPITAL_COMMUNITY): Payer: Self-pay | Admitting: *Deleted

## 2012-10-23 ENCOUNTER — Inpatient Hospital Stay (HOSPITAL_COMMUNITY): Payer: Medicare Other | Admitting: Anesthesiology

## 2012-10-23 ENCOUNTER — Encounter (HOSPITAL_COMMUNITY): Payer: Self-pay | Admitting: Anesthesiology

## 2012-10-23 ENCOUNTER — Inpatient Hospital Stay (HOSPITAL_COMMUNITY)
Admission: RE | Admit: 2012-10-23 | Discharge: 2012-10-25 | DRG: 475 | Disposition: A | Discharge status: Skilled Nursing Facility | Payer: Medicare Other | Source: Ambulatory Visit | Attending: Orthopedic Surgery | Admitting: Orthopedic Surgery

## 2012-10-23 DIAGNOSIS — I252 Old myocardial infarction: Secondary | ICD-10-CM

## 2012-10-23 DIAGNOSIS — S98919A Complete traumatic amputation of unspecified foot, level unspecified, initial encounter: Secondary | ICD-10-CM

## 2012-10-23 DIAGNOSIS — T8740 Infection of amputation stump, unspecified extremity: Principal | ICD-10-CM | POA: Diagnosis present

## 2012-10-23 DIAGNOSIS — E039 Hypothyroidism, unspecified: Secondary | ICD-10-CM | POA: Diagnosis present

## 2012-10-23 DIAGNOSIS — I739 Peripheral vascular disease, unspecified: Secondary | ICD-10-CM | POA: Diagnosis present

## 2012-10-23 DIAGNOSIS — M869 Osteomyelitis, unspecified: Secondary | ICD-10-CM | POA: Diagnosis present

## 2012-10-23 DIAGNOSIS — M9080 Osteopathy in diseases classified elsewhere, unspecified site: Secondary | ICD-10-CM | POA: Diagnosis present

## 2012-10-23 DIAGNOSIS — E1139 Type 2 diabetes mellitus with other diabetic ophthalmic complication: Secondary | ICD-10-CM | POA: Diagnosis present

## 2012-10-23 DIAGNOSIS — E1169 Type 2 diabetes mellitus with other specified complication: Secondary | ICD-10-CM | POA: Diagnosis present

## 2012-10-23 DIAGNOSIS — T8781 Dehiscence of amputation stump: Secondary | ICD-10-CM

## 2012-10-23 DIAGNOSIS — J449 Chronic obstructive pulmonary disease, unspecified: Secondary | ICD-10-CM | POA: Diagnosis present

## 2012-10-23 DIAGNOSIS — K219 Gastro-esophageal reflux disease without esophagitis: Secondary | ICD-10-CM | POA: Diagnosis present

## 2012-10-23 DIAGNOSIS — I509 Heart failure, unspecified: Secondary | ICD-10-CM | POA: Diagnosis present

## 2012-10-23 DIAGNOSIS — I10 Essential (primary) hypertension: Secondary | ICD-10-CM | POA: Diagnosis present

## 2012-10-23 DIAGNOSIS — Z87891 Personal history of nicotine dependence: Secondary | ICD-10-CM

## 2012-10-23 DIAGNOSIS — T8789 Other complications of amputation stump: Secondary | ICD-10-CM | POA: Diagnosis present

## 2012-10-23 DIAGNOSIS — E785 Hyperlipidemia, unspecified: Secondary | ICD-10-CM | POA: Diagnosis present

## 2012-10-23 DIAGNOSIS — E11319 Type 2 diabetes mellitus with unspecified diabetic retinopathy without macular edema: Secondary | ICD-10-CM | POA: Diagnosis present

## 2012-10-23 DIAGNOSIS — M129 Arthropathy, unspecified: Secondary | ICD-10-CM | POA: Diagnosis present

## 2012-10-23 DIAGNOSIS — J4489 Other specified chronic obstructive pulmonary disease: Secondary | ICD-10-CM | POA: Diagnosis present

## 2012-10-23 DIAGNOSIS — Y835 Amputation of limb(s) as the cause of abnormal reaction of the patient, or of later complication, without mention of misadventure at the time of the procedure: Secondary | ICD-10-CM | POA: Diagnosis present

## 2012-10-23 DIAGNOSIS — E1149 Type 2 diabetes mellitus with other diabetic neurological complication: Secondary | ICD-10-CM | POA: Diagnosis present

## 2012-10-23 DIAGNOSIS — E1142 Type 2 diabetes mellitus with diabetic polyneuropathy: Secondary | ICD-10-CM | POA: Diagnosis present

## 2012-10-23 HISTORY — DX: Essential (primary) hypertension: I10

## 2012-10-23 HISTORY — PX: AMPUTATION: SHX166

## 2012-10-23 LAB — APTT: aPTT: 48 seconds — ABNORMAL HIGH (ref 24–37)

## 2012-10-23 LAB — GLUCOSE, CAPILLARY
Glucose-Capillary: 235 mg/dL — ABNORMAL HIGH (ref 70–99)
Glucose-Capillary: 203 mg/dL — ABNORMAL HIGH (ref 70–99)
Glucose-Capillary: 268 mg/dL — ABNORMAL HIGH (ref 70–99)

## 2012-10-23 LAB — CBC
MCH: 28.7 pg (ref 26.0–34.0)
MCHC: 31.1 g/dL (ref 30.0–36.0)
Platelets: 605 10*3/uL — ABNORMAL HIGH (ref 150–400)

## 2012-10-23 LAB — COMPREHENSIVE METABOLIC PANEL
ALT: 5 U/L (ref 0–53)
AST: 10 U/L (ref 0–37)
Albumin: 2.1 g/dL — ABNORMAL LOW (ref 3.5–5.2)
Calcium: 8.8 mg/dL (ref 8.4–10.5)
Sodium: 138 mEq/L (ref 135–145)
Total Protein: 7.7 g/dL (ref 6.0–8.3)

## 2012-10-23 LAB — SURGICAL PCR SCREEN
MRSA, PCR: POSITIVE — AB
Staphylococcus aureus: POSITIVE — AB

## 2012-10-23 SURGERY — AMPUTATION BELOW KNEE
Anesthesia: General | Site: Knee | Laterality: Left | Wound class: Clean

## 2012-10-23 MED ORDER — INSULIN GLARGINE 100 UNIT/ML ~~LOC~~ SOLN
10.0000 [IU] | Freq: Two times a day (BID) | SUBCUTANEOUS | Status: DC
  Administered 2012-10-23 – 2012-10-24 (×2): 10 [IU] via SUBCUTANEOUS

## 2012-10-23 MED ORDER — METOCLOPRAMIDE HCL 5 MG/ML IJ SOLN: 10.0000 mg | Freq: Once | INTRAMUSCULAR | Status: DC | PRN

## 2012-10-23 MED ORDER — ONDANSETRON HCL 4 MG/2ML IJ SOLN
4.0000 mg | Freq: Four times a day (QID) | INTRAMUSCULAR | Status: DC | PRN
  Administered 2012-10-23: 4 mg via INTRAVENOUS
  Filled 2012-10-23: qty 2

## 2012-10-23 MED ORDER — 0.9 % SODIUM CHLORIDE (POUR BTL) OPTIME
TOPICAL | Status: DC | PRN
  Administered 2012-10-23: 1000 mL

## 2012-10-23 MED ORDER — FERROUS SULFATE 325 (65 FE) MG PO TABS
325.0000 mg | ORAL_TABLET | Freq: Every day | ORAL | Status: DC
  Administered 2012-10-24 – 2012-10-25 (×2): 325 mg via ORAL
  Filled 2012-10-23 (×3): qty 1

## 2012-10-23 MED ORDER — METOPROLOL TARTRATE 12.5 MG HALF TABLET
12.5000 mg | ORAL_TABLET | Freq: Two times a day (BID) | ORAL | Status: DC
  Administered 2012-10-23 – 2012-10-25 (×3): 12.5 mg via ORAL
  Filled 2012-10-23 (×5): qty 1

## 2012-10-23 MED ORDER — PHENOL 1.4 % MT LIQD
1.0000 | OROMUCOSAL | Status: DC | PRN
  Filled 2012-10-23: qty 177

## 2012-10-23 MED ORDER — PHENYLEPHRINE HCL 10 MG/ML IJ SOLN
INTRAMUSCULAR | Status: DC | PRN
  Administered 2012-10-23: 80 ug via INTRAVENOUS

## 2012-10-23 MED ORDER — DOCUSATE SODIUM 100 MG PO CAPS
100.0000 mg | ORAL_CAPSULE | Freq: Two times a day (BID) | ORAL | Status: DC
  Administered 2012-10-23 – 2012-10-25 (×4): 100 mg via ORAL
  Filled 2012-10-23 (×4): qty 1

## 2012-10-23 MED ORDER — SODIUM CHLORIDE 0.9 % IJ SOLN: 3.0000 mL | INTRAMUSCULAR | Status: DC | PRN

## 2012-10-23 MED ORDER — HYDROMORPHONE HCL PF 1 MG/ML IJ SOLN: 1.0000 mg | INTRAMUSCULAR | Status: DC | PRN

## 2012-10-23 MED ORDER — SODIUM CHLORIDE 0.9 % IV SOLN
250.0000 mL | INTRAVENOUS | Status: DC | PRN
  Administered 2012-10-23: 13:00:00 via INTRAVENOUS

## 2012-10-23 MED ORDER — SODIUM CHLORIDE 0.9 % IJ SOLN
3.0000 mL | Freq: Two times a day (BID) | INTRAMUSCULAR | Status: DC
  Administered 2012-10-24: 3 mL via INTRAVENOUS

## 2012-10-23 MED ORDER — WARFARIN - PHYSICIAN DOSING INPATIENT: Freq: Every day | Status: DC

## 2012-10-23 MED ORDER — MIRTAZAPINE 7.5 MG PO TABS
7.5000 mg | ORAL_TABLET | Freq: Every day | ORAL | Status: DC
  Administered 2012-10-23 – 2012-10-24 (×2): 7.5 mg via ORAL
  Filled 2012-10-23 (×3): qty 1

## 2012-10-23 MED ORDER — HYDROMORPHONE HCL PF 1 MG/ML IJ SOLN: 0.2500 mg | INTRAMUSCULAR | Status: DC | PRN

## 2012-10-23 MED ORDER — INSULIN GLARGINE 100 UNIT/ML ~~LOC~~ SOLN: 10.0000 [IU] | Freq: Every day | SUBCUTANEOUS | Status: DC

## 2012-10-23 MED ORDER — MUPIROCIN 2 % EX OINT: TOPICAL_OINTMENT | Freq: Once | CUTANEOUS | Status: DC

## 2012-10-23 MED ORDER — OXYCODONE HCL 5 MG PO TABS
5.0000 mg | ORAL_TABLET | ORAL | Status: DC | PRN
  Administered 2012-10-24 – 2012-10-25 (×3): 5 mg via ORAL
  Filled 2012-10-23 (×3): qty 1

## 2012-10-23 MED ORDER — ALBUMIN HUMAN 5 % IV SOLN
INTRAVENOUS | Status: AC
  Administered 2012-10-23: 14:00:00
  Filled 2012-10-23: qty 250

## 2012-10-23 MED ORDER — ACETAMINOPHEN 650 MG RE SUPP: 325.0000 mg | Freq: Four times a day (QID) | RECTAL | Status: DC | PRN

## 2012-10-23 MED ORDER — METOPROLOL TARTRATE 12.5 MG HALF TABLET
12.5000 mg | ORAL_TABLET | Freq: Once | ORAL | Status: AC
  Administered 2012-10-23: 12.5 mg via ORAL

## 2012-10-23 MED ORDER — ZOLPIDEM TARTRATE 5 MG PO TABS: 5.0000 mg | ORAL_TABLET | Freq: Every evening | ORAL | Status: DC | PRN

## 2012-10-23 MED ORDER — WARFARIN SODIUM 4 MG PO TABS
4.0000 mg | ORAL_TABLET | Freq: Every day | ORAL | Status: DC
  Administered 2012-10-23 – 2012-10-24 (×2): 4 mg via ORAL
  Filled 2012-10-23 (×5): qty 1

## 2012-10-23 MED ORDER — LACTATED RINGERS IV SOLN
INTRAVENOUS | Status: DC | PRN
  Administered 2012-10-23: 10:00:00 via INTRAVENOUS

## 2012-10-23 MED ORDER — ACETAMINOPHEN 325 MG PO TABS
325.0000 mg | ORAL_TABLET | Freq: Four times a day (QID) | ORAL | Status: DC | PRN
  Administered 2012-10-25: 325 mg via ORAL
  Filled 2012-10-23: qty 1

## 2012-10-23 MED ORDER — INSULIN ASPART 100 UNIT/ML ~~LOC~~ SOLN
3.0000 [IU] | Freq: Three times a day (TID) | SUBCUTANEOUS | Status: DC
  Administered 2012-10-25: 3 [IU] via SUBCUTANEOUS

## 2012-10-23 MED ORDER — SODIUM CHLORIDE 0.9 % IV SOLN: INTRAVENOUS | Status: DC

## 2012-10-23 MED ORDER — ASPIRIN 81 MG PO CHEW
81.0000 mg | CHEWABLE_TABLET | Freq: Every day | ORAL | Status: DC
  Administered 2012-10-23 – 2012-10-25 (×3): 81 mg via ORAL
  Filled 2012-10-23 (×3): qty 1

## 2012-10-23 MED ORDER — PROPOFOL 10 MG/ML IV BOLUS
INTRAVENOUS | Status: DC | PRN
  Administered 2012-10-23: 100 mg via INTRAVENOUS

## 2012-10-23 MED ORDER — CEFAZOLIN SODIUM-DEXTROSE 2-3 GM-% IV SOLR
INTRAVENOUS | Status: AC
  Filled 2012-10-23: qty 50

## 2012-10-23 MED ORDER — IPRATROPIUM BROMIDE 0.02 % IN SOLN: 0.5000 mg | RESPIRATORY_TRACT | Status: DC | PRN

## 2012-10-23 MED ORDER — INSULIN ASPART 100 UNIT/ML ~~LOC~~ SOLN
0.0000 [IU] | Freq: Three times a day (TID) | SUBCUTANEOUS | Status: DC
  Administered 2012-10-23: 5 [IU] via SUBCUTANEOUS
  Administered 2012-10-24: 9 [IU] via SUBCUTANEOUS
  Administered 2012-10-24 – 2012-10-25 (×2): 3 [IU] via SUBCUTANEOUS

## 2012-10-23 MED ORDER — LACTATED RINGERS IV SOLN
INTRAVENOUS | Status: DC
  Administered 2012-10-23: 10:00:00 via INTRAVENOUS

## 2012-10-23 MED ORDER — PANTOPRAZOLE SODIUM 40 MG PO TBEC
40.0000 mg | DELAYED_RELEASE_TABLET | Freq: Every day | ORAL | Status: DC
  Administered 2012-10-23 – 2012-10-25 (×3): 40 mg via ORAL
  Filled 2012-10-23 (×3): qty 1

## 2012-10-23 MED ORDER — SODIUM CHLORIDE 0.9 % IV SOLN
INTRAVENOUS | Status: DC | PRN
  Administered 2012-10-23: 11:00:00 via INTRAVENOUS

## 2012-10-23 MED ORDER — OXYCODONE HCL 5 MG/5ML PO SOLN: 5.0000 mg | Freq: Once | ORAL | Status: DC | PRN

## 2012-10-23 MED ORDER — EPHEDRINE SULFATE 50 MG/ML IJ SOLN
INTRAMUSCULAR | Status: DC | PRN
  Administered 2012-10-23: 10 mg via INTRAVENOUS

## 2012-10-23 MED ORDER — ALUM & MAG HYDROXIDE-SIMETH 200-200-20 MG/5ML PO SUSP: 30.0000 mL | Freq: Four times a day (QID) | ORAL | Status: DC | PRN

## 2012-10-23 MED ORDER — METOPROLOL TARTRATE 12.5 MG HALF TABLET
ORAL_TABLET | ORAL | Status: AC
  Administered 2012-10-23: 12.5 mg via ORAL
  Filled 2012-10-23: qty 1

## 2012-10-23 MED ORDER — LEVOTHYROXINE SODIUM 88 MCG PO TABS
88.0000 ug | ORAL_TABLET | Freq: Every day | ORAL | Status: DC
  Administered 2012-10-24 – 2012-10-25 (×2): 88 ug via ORAL
  Filled 2012-10-23 (×3): qty 1

## 2012-10-23 MED ORDER — MUPIROCIN 2 % EX OINT
TOPICAL_OINTMENT | CUTANEOUS | Status: AC
  Filled 2012-10-23: qty 22

## 2012-10-23 MED ORDER — INSULIN GLARGINE 100 UNIT/ML ~~LOC~~ SOLN: 10.0000 [IU] | Freq: Two times a day (BID) | SUBCUTANEOUS | Status: DC

## 2012-10-23 MED ORDER — GUAIFENESIN-DM 100-10 MG/5ML PO SYRP
15.0000 mL | ORAL_SOLUTION | ORAL | Status: DC | PRN
  Filled 2012-10-23: qty 15

## 2012-10-23 MED ORDER — OXYCODONE HCL 5 MG PO TABS: 5.0000 mg | ORAL_TABLET | Freq: Once | ORAL | Status: DC | PRN

## 2012-10-23 MED ORDER — ALBUTEROL SULFATE HFA 108 (90 BASE) MCG/ACT IN AERS
2.0000 | INHALATION_SPRAY | Freq: Four times a day (QID) | RESPIRATORY_TRACT | Status: DC | PRN
  Filled 2012-10-23: qty 6.7

## 2012-10-23 MED ORDER — PRO-STAT SUGAR FREE PO LIQD
30.0000 mL | Freq: Two times a day (BID) | ORAL | Status: DC
  Administered 2012-10-24 – 2012-10-25 (×3): 30 mL via ORAL
  Filled 2012-10-23 (×5): qty 30

## 2012-10-23 MED ORDER — MAGNESIUM HYDROXIDE 400 MG/5ML PO SUSP: 30.0000 mL | Freq: Every day | ORAL | Status: DC | PRN

## 2012-10-23 SURGICAL SUPPLY — 46 items
BANDAGE ESMARK 6X9 LF (GAUZE/BANDAGES/DRESSINGS) ×1 IMPLANT
BANDAGE GAUZE ELAST BULKY 4 IN (GAUZE/BANDAGES/DRESSINGS) ×3 IMPLANT
BLADE SAW RECIP 87.9 MT (BLADE) ×2 IMPLANT
BLADE SURG 21 STRL SS (BLADE) ×2 IMPLANT
BNDG CMPR 9X6 STRL LF SNTH (GAUZE/BANDAGES/DRESSINGS) ×1
BNDG COHESIVE 6X5 TAN STRL LF (GAUZE/BANDAGES/DRESSINGS) ×3 IMPLANT
BNDG ESMARK 6X9 LF (GAUZE/BANDAGES/DRESSINGS) ×2
CLOTH BEACON ORANGE TIMEOUT ST (SAFETY) ×2 IMPLANT
COVER SURGICAL LIGHT HANDLE (MISCELLANEOUS) ×2 IMPLANT
CUFF TOURNIQUET SINGLE 34IN LL (TOURNIQUET CUFF) IMPLANT
CUFF TOURNIQUET SINGLE 44IN (TOURNIQUET CUFF) IMPLANT
DRAIN PENROSE 1/2X12 LTX STRL (WOUND CARE) IMPLANT
DRAPE EXTREMITY T 121X128X90 (DRAPE) ×2 IMPLANT
DRAPE PROXIMA HALF (DRAPES) ×4 IMPLANT
DRAPE U-SHAPE 47X51 STRL (DRAPES) ×4 IMPLANT
DRSG ADAPTIC 3X8 NADH LF (GAUZE/BANDAGES/DRESSINGS) ×2 IMPLANT
DRSG PAD ABDOMINAL 8X10 ST (GAUZE/BANDAGES/DRESSINGS) ×2 IMPLANT
DURAPREP 26ML APPLICATOR (WOUND CARE) ×2 IMPLANT
ELECT REM PT RETURN 9FT ADLT (ELECTROSURGICAL) ×2
ELECTRODE REM PT RTRN 9FT ADLT (ELECTROSURGICAL) ×1 IMPLANT
EVACUATOR 1/8 PVC DRAIN (DRAIN) IMPLANT
GLOVE BIOGEL PI IND STRL 9 (GLOVE) ×1 IMPLANT
GLOVE BIOGEL PI INDICATOR 9 (GLOVE) ×1
GLOVE SURG ORTHO 9.0 STRL STRW (GLOVE) ×2 IMPLANT
GOWN PREVENTION PLUS XLARGE (GOWN DISPOSABLE) ×2 IMPLANT
GOWN SRG XL XLNG 56XLVL 4 (GOWN DISPOSABLE) ×1 IMPLANT
GOWN STRL NON-REIN XL XLG LVL4 (GOWN DISPOSABLE) ×2
KIT BASIN OR (CUSTOM PROCEDURE TRAY) ×2 IMPLANT
KIT ROOM TURNOVER OR (KITS) ×2 IMPLANT
MANIFOLD NEPTUNE II (INSTRUMENTS) ×2 IMPLANT
NS IRRIG 1000ML POUR BTL (IV SOLUTION) ×2 IMPLANT
PACK GENERAL/GYN (CUSTOM PROCEDURE TRAY) ×2 IMPLANT
PAD ARMBOARD 7.5X6 YLW CONV (MISCELLANEOUS) ×4 IMPLANT
SPONGE GAUZE 4X4 12PLY (GAUZE/BANDAGES/DRESSINGS) ×2 IMPLANT
SPONGE LAP 18X18 X RAY DECT (DISPOSABLE) IMPLANT
STAPLER VISISTAT 35W (STAPLE) IMPLANT
STOCKINETTE IMPERVIOUS LG (DRAPES) ×2 IMPLANT
SUT ETHILON 2 0 PSLX (SUTURE) ×2 IMPLANT
SUT PDS AB 1 CT  36 (SUTURE)
SUT PDS AB 1 CT 36 (SUTURE) IMPLANT
SUT SILK 2 0 (SUTURE) ×2
SUT SILK 2-0 18XBRD TIE 12 (SUTURE) ×1 IMPLANT
TOWEL OR 17X24 6PK STRL BLUE (TOWEL DISPOSABLE) ×2 IMPLANT
TOWEL OR 17X26 10 PK STRL BLUE (TOWEL DISPOSABLE) ×2 IMPLANT
TUBE ANAEROBIC SPECIMEN COL (MISCELLANEOUS) IMPLANT
WATER STERILE IRR 1000ML POUR (IV SOLUTION) ×2 IMPLANT

## 2012-10-23 NOTE — Op Note (Signed)
OPERATIVE REPORT  DATE OF SURGERY: 10/23/2012  PATIENT:  [name],  79 y.o. male  PRE-OPERATIVE DIAGNOSIS:  Abscess left below knee amputation  POST-OPERATIVE DIAGNOSIS:  Abscess left below knee amputation  PROCEDURE:  Procedure(s): Left below knee amputation revision  SURGEON:  Surgeon(s): Nadara Mustard, MD  ANESTHESIA:   general  EBL:  Minimal ML  SPECIMEN:  No Specimen  TOURNIQUET:  * No tourniquets in log *  PROCEDURE DETAILS: Patient is an 79 year old gentleman with severe peripheral mask disease who presents with abscess left transtibial amputation. Due to failure conservative treatment including antibiotics patient presents at this time for revision surgery. Risks and benefits were discussed including persistent infection nonhealing of the wound need for higher level amputation. Patient states he understands and wished to proceed at this time. Description of procedure patient brought to the operating room and underwent a general anesthetic. After adequate levels and anesthesia were obtained patient's left lower extremity was prepped using DuraPrep and draped into a sterile field. A fishmouth incision was made around the wound and ulceration. There was purulent drainage proximally a 2 inches of the distal tibia and fibular were resected. The muscle and soft tissue that was involved the abscess was resected. Hemostasis was obtained the wound was irrigated there was healthy viable tissue post debridement. The incision was closed using 2-0 nylon. The wound is covered with Adaptic orthopedic sponges AB dressing Kerlix and Coban. Patient was extubated taken to the PACU in stable condition.  PLAN OF CARE: Admit to inpatient   PATIENT DISPOSITION:  PACU - hemodynamically stable.   Nadara Mustard, MD 10/23/2012 11:17 AM

## 2012-10-23 NOTE — Progress Notes (Signed)
Call to dr. Lajoyce Corners to report sbp still in 35s.  He gave order for 250 cc 5 % albumin.

## 2012-10-23 NOTE — Progress Notes (Signed)
Call to dr. To report sbp in the 80s despinte ephedrine given by dr. Gelene Mink and 500 cc bolus.  He gave order for ns a 75 per hour.

## 2012-10-23 NOTE — Preoperative (Signed)
Beta Blockers   Reason not to administer Beta Blockers:Not Applicable 

## 2012-10-23 NOTE — Anesthesia Procedure Notes (Signed)
Procedure Name: LMA Insertion Date/Time: 10/23/2012 10:34 AM Performed by: Gwenyth Allegra Pre-anesthesia Checklist: Patient identified, Timeout performed, Emergency Drugs available and Suction available Oxygen Delivery Method: Circle system utilized Preoxygenation: Pre-oxygenation with 100% oxygen Intubation Type: IV induction Ventilation: Mask ventilation without difficulty LMA: LMA with gastric port inserted LMA Size: 4.0 Number of attempts: 1 Placement Confirmation: positive ETCO2 and breath sounds checked- equal and bilateral Dental Injury: Teeth and Oropharynx as per pre-operative assessment

## 2012-10-23 NOTE — Anesthesia Postprocedure Evaluation (Signed)
Anesthesia Post Note  Patient: [name]  Procedure(s) Performed: Procedure(s) (LRB): Left below knee amputation revision (Left)  Anesthesia type: General  Patient location: PACU  Post pain: Pain level controlled and Adequate analgesia  Post assessment: Post-op Vital signs reviewed, Patient's Cardiovascular Status Stable, Respiratory Function Stable, Patent Airway and Pain level controlled  Last Vitals:  Filed Vitals:   10/23/12 1130  BP: 75/38  Pulse:   Temp:   Resp:     Post vital signs: Reviewed and stable  Level of consciousness: awake, alert  and oriented  Complications: No apparent anesthesia complications

## 2012-10-23 NOTE — Transfer of Care (Signed)
Immediate Anesthesia Transfer of Care Note  Patient: [name]  Procedure(s) Performed: Procedure(s) with comments: Left below knee amputation revision (Left) - left below knee amputation revision  Patient Location: PACU  Anesthesia Type:General  Level of Consciousness: awake  Airway & Oxygen Therapy: Patient Spontanous Breathing and Patient connected to nasal cannula oxygen  Post-op Assessment: Report given to PACU RN and Post -op Vital signs reviewed and stable  Post vital signs: Reviewed and stable  Complications: No apparent anesthesia complications

## 2012-10-23 NOTE — H&P (Signed)
[name] is an 79 y.o. male.   Chief Complaint: Abscess left transtibial amputation. HPI: Patient is an 79 year old gentleman with peripheral vascular disease who is status post a left transtibial amputation. Patient presents at this time with purulent drainage ulceration from the left transtibial amputation site.  Past Medical History  Diagnosis Date  . Thyroid disease   . Anemia   . Hyperlipidemia   . Neuropathy, diabetic   . Diabetic retinopathy   . GERD (gastroesophageal reflux disease)   . Arthritis   . Urgency of urination   . Hypothyroidism   . Sacral wound     hx of  . Cancer   . Diabetic retinopathy   . Peripheral vascular disease   . Diabetes mellitus     sees Dr. Chilton Si [facility])  . CHF (congestive heart failure)   . Hypertension     Past Surgical History  Procedure Laterality Date  . Cataract extraction, bilateral    . Colonoscopy w/ polypectomy    . Esophagogastroduodenoscopy  04/25/2012    Procedure: ESOPHAGOGASTRODUODENOSCOPY (EGD);  Surgeon: Meryl Dare, MD,FACG;  Location: [facility];  Service: Endoscopy;  Laterality: N/A;  . Amputation  05/16/2012    Procedure: AMPUTATION BELOW KNEE;  Surgeon: Nadara Mustard, MD;  Location: [facility] OR;  Service: Orthopedics;  Laterality: Right;  Right Below Knee Amputation  . Tonsillectomy    . Foot amputation      right bka  . Eye surgery      bilateral  . Amputation  09/14/2012    Procedure: AMPUTATION BELOW KNEE;  Surgeon: Nadara Mustard, MD;  Location: [facility] OR;  Service: Orthopedics;  Laterality: Left;    Family History  Problem Relation Age of Onset  . Hypertension     Social History:  reports that he quit smoking about 5 years ago. His smoking use included Cigarettes. He smoked 0.25 packs per day. He has never used smokeless tobacco. He reports that  drinks alcohol. He reports that he does not use illicit drugs.  Allergies:  Allergies  Allergen Reactions  . Bactrim (Sulfamethoxazole W-Trimethoprim) Swelling     Lips, eyes, cheeks swollen  . Doxycycline Swelling    Lips, eyes, cheeks were all swollen    No prescriptions prior to admission    No results found for this or any previous visit (from the past 48 hour(s)). No results found.  Review of Systems  All other systems reviewed and are negative.    Height 0' (0 m), weight 60.102 kg (132 lb 8 oz). Physical Exam  On examination patient is purulent drainage from the transtibial amputation site. There is ischemic ulcers and necrotic tissue over the residual limb. Assessment/Plan Assessment: Abscess left transtibial amputation.  Plan: We'll plan for revision of the transtibial amputation. Risks and benefits were discussed including persistent infection neurovascular injury nonhealing of the wound need for higher level amputation. Patient states he understands and wished to proceed at this time.  Waymon Laser V 10/23/2012, 6:22 AM

## 2012-10-23 NOTE — Progress Notes (Signed)
10 mg of ephedrine given by crna per dr. Gelene Mink.

## 2012-10-23 NOTE — Anesthesia Preprocedure Evaluation (Signed)
Anesthesia Evaluation  Patient identified by MRN, date of birth, ID band Patient awake    Reviewed: Allergy & Precautions, H&P , NPO status , Patient's Chart, lab work & pertinent test results, reviewed documented beta blocker date and time   Airway Mallampati: II TM Distance: >3 FB Neck ROM: full    Dental   Pulmonary COPD COPD inhaler,  breath sounds clear to auscultation        Cardiovascular hypertension, On Medications and On Home Beta Blockers + Past MI, + Peripheral Vascular Disease and +CHF Rhythm:regular     Neuro/Psych negative neurological ROS  negative psych ROS   GI/Hepatic negative GI ROS, Neg liver ROS, GERD-  Medicated and Controlled,  Endo/Other  diabetes, Insulin DependentHypothyroidism   Renal/GU negative Renal ROS  negative genitourinary   Musculoskeletal   Abdominal   Peds  Hematology  (+) anemia ,   Anesthesia Other Findings See surgeon's H&P   Reproductive/Obstetrics negative OB ROS                           Anesthesia Physical Anesthesia Plan  ASA: III  Anesthesia Plan: General   Post-op Pain Management:    Induction: Intravenous  Airway Management Planned: LMA  Additional Equipment:   Intra-op Plan:   Post-operative Plan: Extubation in OR  Informed Consent: I have reviewed the patients History and Physical, chart, labs and discussed the procedure including the risks, benefits and alternatives for the proposed anesthesia with the patient or authorized representative who has indicated his/her understanding and acceptance.   Dental Advisory Given  Plan Discussed with: CRNA and Surgeon  Anesthesia Plan Comments:         Anesthesia Quick Evaluation

## 2012-10-24 LAB — CBC
HCT: 14.2 % — ABNORMAL LOW (ref 39.0–52.0)
Hemoglobin: 4.6 g/dL — CL (ref 13.0–17.0)
MCHC: 32.4 g/dL (ref 30.0–36.0)
MCV: 92.2 fL (ref 78.0–100.0)
RDW: 17.1 % — ABNORMAL HIGH (ref 11.5–15.5)
WBC: 16.9 10*3/uL — ABNORMAL HIGH (ref 4.0–10.5)

## 2012-10-24 LAB — PREPARE RBC (CROSSMATCH)

## 2012-10-24 LAB — GLUCOSE, CAPILLARY
Glucose-Capillary: 458 mg/dL — ABNORMAL HIGH (ref 70–99)
Glucose-Capillary: 398 mg/dL — ABNORMAL HIGH (ref 70–99)

## 2012-10-24 LAB — BASIC METABOLIC PANEL
BUN: 23 mg/dL (ref 6–23)
Chloride: 100 mEq/L (ref 96–112)
Creatinine, Ser: 0.81 mg/dL (ref 0.50–1.35)
GFR calc Af Amer: >90 mL/min (ref >90)
Glucose, Bld: 435 mg/dL — ABNORMAL HIGH (ref 70–99)
Potassium: 4.4 mEq/L (ref 3.5–5.1)

## 2012-10-24 MED ORDER — INSULIN ASPART 100 UNIT/ML ~~LOC~~ SOLN
10.0000 [IU] | Freq: Once | SUBCUTANEOUS | Status: AC
  Administered 2012-10-24: 10 [IU] via SUBCUTANEOUS

## 2012-10-24 NOTE — Progress Notes (Signed)
PT Cancellation Note  Patient Details Name: [name] MRN: [id number] DOB: 1929/03/12   Cancelled Treatment:    Reason Eval/Treat Not Completed: Medical issues which prohibited therapy     Pt with Hgb 4.6 and low blood pressures today as well; Will re-attempt 2/21 or as schedule allows.   [facility] 10/24/2012, 4:04 PM

## 2012-10-24 NOTE — Progress Notes (Signed)
OT Cancellation Note  Patient Details Name: [name] MRN: [id number] DOB: [birth year]   Cancelled Treatment:     Cancel OT today, pt receiving blood due to low Hgb of 4.6. Will re attempt tomorrow  Galen Manila, OT 10/24/2012, 3:38 PM

## 2012-10-24 NOTE — Progress Notes (Signed)
SW awaiting Pt's recommendation for disposition.  Sabino Niemann, MSW [phone]

## 2012-10-24 NOTE — Progress Notes (Signed)
Inpatient Diabetes Program Recommendations  AACE/ADA: New Consensus Statement on Inpatient Glycemic Control (2013)  Target Ranges:  Prepandial:   less than 140 mg/dL      Peak postprandial:   less than 180 mg/dL (1-2 hours)      Critically ill patients:  140 - 180 mg/dL   Reason for Visit: Results for [name], [name] (MRN [id number]) as of 10/24/2012 13:56  Ref. Range 10/23/2012 22:34 10/24/2012 06:38 10/24/2012 07:36 10/24/2012 08:59 10/24/2012 11:35  Glucose-Capillary Latest Range: 70-99 mg/dL 409 (H) 811 (H) 914 (H) 398 (H) 232 (H)   Note elevated CBG's.  According to medication reconciliation, patient was taking 70/30-20 units with breakfast and Lantus 10 units at HS.  Currently patient is on Lantus 10 units bid.  May consider adding Novolog meal coverage 3 units tid with meals-Hold if patient eats less than 50%.

## 2012-10-24 NOTE — Progress Notes (Signed)
Pt CBG 35 at 2245, with no s/s of hypoglycemia.  Was given PB, crackers, milk and ensure.  Pt has been refusing dextrose stating "it jacks my sugar up too high".  At 2320, pt CBG 36, offered OJ.  CN aware and will continue to monitor for status changes.  Pt continues to refuse Dextrose and no s/s of hypoglycemia.  Alert x3.

## 2012-10-24 NOTE — Progress Notes (Signed)
Hypoglycemic Event  CBG: 31 @ 2245  Treatment: 15 GM carbohydrate snack/PB crackers, milk, ensure  Symptoms: None  Follow-up CBG: Time:2320 CBG Result:36  Possible Reasons for Event: Unknown  Comments/MD notified:Pt refused Dextrose stating "it jacks my sugar up too high".  Pt has no s/s of hypoglycemia.  Snacks offered and tolerated well.      [name]  Remember to initiate Hypoglycemia Order Set & complete

## 2012-10-24 NOTE — Progress Notes (Signed)
Patient was indisposed when SW went to speak to patient. Patient is from [place]. SW will meet with patient in the am to discuss returning to [facility], MSW, [phone]

## 2012-10-24 NOTE — Progress Notes (Signed)
CRITICAL VALUE ALERT  Critical value received:  hgb 4.6  Date of notification:  10/24/12  Time of notification:  0830  Critical value read back:yes  Nurse who received alert:  Tilman Neat  MD notified (1st page):  Dr. Lajoyce Corners  Time of first page:  [phone]  MD notified (2nd page):  Time of second page:  Responding MD:  Dr. Lajoyce Corners  Time MD responded:  [phone]

## 2012-10-24 NOTE — Progress Notes (Signed)
Patient ID: [name], male   DOB: Nov 23, 1928, 79 y.o.   MRN: [id number] Postoperative day one revision left transtibial amputation. Hemoglobin 7.6 we will plan for type and cross and transfused with 2 units of packed red blood cells. Plan for discharge to skilled nursing.

## 2012-10-25 ENCOUNTER — Encounter (HOSPITAL_COMMUNITY): Payer: Self-pay | Admitting: Orthopedic Surgery

## 2012-10-25 LAB — GLUCOSE, CAPILLARY
Glucose-Capillary: 157 mg/dL — ABNORMAL HIGH (ref 70–99)
Glucose-Capillary: 35 mg/dL — CL (ref 70–99)
Glucose-Capillary: 72 mg/dL (ref 70–99)
Glucose-Capillary: 222 mg/dL — ABNORMAL HIGH (ref 70–99)

## 2012-10-25 LAB — CBC WITH DIFFERENTIAL/PLATELET
Basophils Absolute: 0 10*3/uL (ref 0.0–0.1)
HCT: 25.1 % — ABNORMAL LOW (ref 39.0–52.0)
Hemoglobin: 8.4 g/dL — ABNORMAL LOW (ref 13.0–17.0)
Lymphocytes Relative: 10 % — ABNORMAL LOW (ref 12–46)
Monocytes Absolute: 1.2 10*3/uL — ABNORMAL HIGH (ref 0.1–1.0)
Monocytes Relative: 8 % (ref 3–12)
Neutro Abs: 12.2 10*3/uL — ABNORMAL HIGH (ref 1.7–7.7)
RDW: 16.4 % — ABNORMAL HIGH (ref 11.5–15.5)
WBC: 16 10*3/uL — ABNORMAL HIGH (ref 4.0–10.5)

## 2012-10-25 LAB — TYPE AND SCREEN

## 2012-10-25 LAB — PROTIME-INR: INR: 2.24 — ABNORMAL HIGH (ref 0.00–1.49)

## 2012-10-25 MED ORDER — OXYCODONE-ACETAMINOPHEN 5-325 MG PO TABS: 1.0000 | ORAL_TABLET | ORAL | Status: DC | PRN

## 2012-10-25 MED ORDER — INSULIN GLARGINE 100 UNIT/ML ~~LOC~~ SOLN: 10.0000 [IU] | Freq: Every day | SUBCUTANEOUS | Status: DC

## 2012-10-25 NOTE — Plan of Care (Signed)
Problem: Phase II Progression Outcomes Goal: Discharge plan established Recommend SNF for further therapy needs after acute care d/c     

## 2012-10-25 NOTE — Progress Notes (Signed)
Pt D/C'ed to [facility] SNF via [facility] in stable condition. All personal belongings sent with patient. Staff from [facility] returned later to retrieve his wheelchair. No family present at time of transfer.

## 2012-10-25 NOTE — Clinical Social Work Psychosocial (Signed)
Late entry: Clinical Social Work Department  BRIEF PSYCHOSOCIAL ASSESSMENT  Patient: [name]  Account Number: [id number]  Admit date: 10/23/12 Clinical Social Worker Sabino Niemann, MSW Date/Time: 10/25/2012 Referred by: Physician Date Referred: 09/20/12 Referred for   SNF Placement- Patient is from [facility]   Other Referral:  Interview type: Patient  Other interview type: PSYCHOSOCIAL DATA  Living Status: With wife Admitted from facility: [facility]  Level of care: SNF Primary support name: Tiedeman,Zelma S   Primary support relationship to patient: Spouse Degree of support available:  Strong and vested  CURRENT CONCERNS  Current Concerns   Post-Acute Placement   Other Concerns:  SOCIAL WORK ASSESSMENT / PLAN  CSW met with pt re: PT recommendation for to return to SNF.   Pt came from [facility]  Patient is agreeable to returning to [facility]        Assessment/plan status: Information/Referral to [facility]  Other assessment/ plan:  Information/referral to community resources:  SNF   [facility]   PATIENT'S/FAMILY'S RESPONSE TO PLAN OF CARE:  Pt  Reports he is  agreeable to returning to [facility] in order to increase strength and independence with mobility prior to return home  Pt verbalized understanding of placement process and appreciation for CSW assist.   Sabino Niemann, MSW [phone]

## 2012-10-25 NOTE — Progress Notes (Signed)
Pt CBG now 74.  OJ was given, pt tolerated well.  No s/s of hypoglycemia thus far.  Will continue to monitor for status changes.  CN aware.

## 2012-10-25 NOTE — Evaluation (Signed)
Physical Therapy Evaluation Patient Details Name: [name] MRN: [id number] DOB: September 26, 1928 Today's Date: 10/25/2012 Time: 5621-3086 PT Time Calculation (min): 31 min  PT Assessment / Plan / Recommendation Clinical Impression  pt rpesents with Revision of L BKA.  pt notes he has been staying at [facility] and plans to return there today.  Will defer all further therapies to SNF.      PT Assessment  All further PT needs can be met in the next venue of care    Follow Up Recommendations  SNF    Does the patient have the potential to tolerate intense rehabilitation      Barriers to Discharge        Equipment Recommendations       Recommendations for Other Services     Frequency      Precautions / Restrictions Precautions Precautions: Fall Restrictions Weight Bearing Restrictions: No   Pertinent Vitals/Pain Indicates 4/10 since having pain meds.        Mobility  Bed Mobility Bed Mobility: Supine to Sit;Sitting - Scoot to Edge of Bed;Sit to Supine Supine to Sit: 6: Modified independent (Device/Increase time);5: Supervision Sitting - Scoot to Edge of Bed: 6: Modified independent (Device/Increase time);5: Supervision Sit to Supine: 6: Modified independent (Device/Increase time);5: Supervision Details for Bed Mobility Assistance: Pt required assist to be positioned to head of bed Transfers Transfers: Not assessed Details for Transfer Assistance: Pt declined transfers Ambulation/Gait Ambulation/Gait Assistance: Not tested (comment) Stairs: No Wheelchair Mobility Wheelchair Mobility: No    Exercises     PT Diagnosis: Generalized weakness  PT Problem List: Decreased strength;Decreased range of motion;Decreased balance;Decreased mobility PT Treatment Interventions:     PT Goals    Visit Information  Last PT Received On: 10/25/12 Assistance Needed: +1 PT/OT Co-Evaluation/Treatment: Yes    Subjective Data  Subjective: I don't feel like any therapy.   Patient  Stated Goal: Home   Prior Functioning  Home Living Available Help at Discharge: Skilled Nursing Facility Additional Comments: pt from [facility] and plan to return at D/C.   Prior Function Level of Independence: Needs assistance Needs Assistance: Bathing;Dressing;Toileting;Transfers Bath: Minimal Dressing: Minimal Toileting: Minimal Able to Take Stairs?: No Driving: No Vocation: Retired Comments: pt receives Consulting civil engineer at [employer] for ADLs, howeevr pt demos ability to perform ADLs with less assist than is most likely provided at [employer] Communication: No difficulties Dominant Hand: Left (uses R hand, L ring and little finger w/ flexion contracture)    Cognition  Cognition Overall Cognitive Status: Appears within functional limits for tasks assessed/performed Arousal/Alertness: Awake/alert Orientation Level: Appears intact for tasks assessed Behavior During Session: [facility] for tasks performed    Extremity/Trunk Assessment Right Upper Extremity Assessment RUE ROM/Strength/Tone: [facility] for tasks assessed Left Upper Extremity Assessment LUE ROM/Strength/Tone: WFL for tasks assessed LUE Coordination: Deficits LUE Coordination Deficits: L ring and little finger flexion contractures Right Lower Extremity Assessment RLE ROM/Strength/Tone: Deficits RLE ROM/Strength/Tone Deficits: R BKA.  Per pt was done around the [place].  R knee flexion contracture ~20 degrees from neutral.   RLE Sensation: WFL - Light Touch Left Lower Extremity Assessment LLE ROM/Strength/Tone: Deficits LLE ROM/Strength/Tone Deficits: pt with revision of BKA.   LLE Sensation: WFL - Light Touch Trunk Assessment Trunk Assessment: Normal   Balance Balance Balance Assessed: Yes Static Sitting Balance Static Sitting - Balance Support:  (Unilateral UE support) Static Sitting - Level of Assistance: 6: Modified independent (Device/Increase time) Dynamic Sitting Balance Dynamic Sitting - Level of Assistance: 5: Stand by  assistance  End of Session PT - End of Session Activity Tolerance: Patient tolerated treatment well Patient left: in bed;with call bell/phone within reach Nurse Communication: Mobility status  GP     Sunny Schlein, [place] [phone] 10/25/2012, 11:11 AM

## 2012-10-25 NOTE — Progress Notes (Signed)
Clinical social worker assisted with patient discharge to skilled nursing facility, [facility].  CSW addressed all family questions and concerns. CSW copied chart and added all important documents. CSW also set up patient transportation with [employer]. Clinical Social Worker will sign off for now as social work intervention is no longer needed.

## 2012-10-25 NOTE — Discharge Summary (Signed)
Physician Discharge Summary  Patient ID: [name] MRN: [id number] DOB/AGE: 1929-06-18 79 y.o.  Admit date: 10/23/2012 Discharge date: 10/25/2012  Admission Diagnoses: Dehiscence abscess osteomyelitis left transtibial amputation  Discharge Diagnoses: Same Active Problems:   * No active hospital problems. *   Discharged Condition: stable  Hospital Course: Patient's hospital course was essentially unremarkable. He underwent revision transtibial amputation. Postoperatively patient's hemoglobin dropped and he was transfused with 2 units of packed red blood cells. Patient was discharged back to skilled nursing.  Consults: None  Significant Diagnostic Studies: labs: Routine labs  Treatments: surgery: See operative note  Discharge Exam: Blood pressure 103/47, pulse 68, temperature 98 F (36.7 C), temperature source Oral, resp. rate 16, height 0' (0 m), weight 60.102 kg (132 lb 8 oz), SpO2 100.00%. Incision/Wound: dressing clean dry and intact at time of discharge  Disposition: 03-Skilled Nursing Facility  Discharge Orders   Future Orders Complete By Expires     Call MD / Call 911  As directed     Comments:      If you experience chest pain or shortness of breath, CALL 911 and be transported to the hospital emergency room.  If you develope a fever above 101 F, pus (white drainage) or increased drainage or redness at the wound, or calf pain, call your surgeon's office.    Constipation Prevention  As directed     Comments:      Drink plenty of fluids.  Prune juice may be helpful.  You may use a stool softener, such as Colace (over the counter) 100 mg twice a day.  Use MiraLax (over the counter) for constipation as needed.    Diet - low sodium heart healthy  As directed     Increase activity slowly as tolerated  As directed         Medication List    TAKE these medications       albuterol 108 (90 BASE) MCG/ACT inhaler  Commonly known as:  PROVENTIL HFA;VENTOLIN HFA  Inhale 2  puffs into the lungs every 6 (six) hours as needed for wheezing.     aspirin 81 MG chewable tablet  Chew 1 tablet (81 mg total) by mouth daily.     docusate sodium 100 MG capsule  Commonly known as:  COLACE  Take 100 mg by mouth 2 (two) times daily.     feeding supplement Liqd  Take 30 mLs by mouth 2 (two) times daily.     ferrous sulfate 325 (65 FE) MG tablet  Take 325 mg by mouth daily with breakfast.     HUMULIN 70/30 (70-30) 100 UNIT/ML injection  Generic drug:  insulin NPH-insulin regular  Inject 20 Units into the skin daily with breakfast.     insulin glargine 100 UNIT/ML injection  Commonly known as:  LANTUS  Inject 10 Units into the skin at bedtime.     ipratropium 0.02 % nebulizer solution  Commonly known as:  ATROVENT  Take 0.5 mg by nebulization every 2 (two) hours as needed. For shortness of breath     levothyroxine 88 MCG tablet  Commonly known as:  SYNTHROID, LEVOTHROID  Take 88 mcg by mouth daily.     metoprolol tartrate 25 MG tablet  Commonly known as:  LOPRESSOR  Take 12.5 mg by mouth 2 (two) times daily.     mirtazapine 15 MG tablet  Commonly known as:  REMERON  Take 7.5 mg by mouth at bedtime.     omeprazole 20 MG capsule  Commonly known as:  PRILOSEC  Take 20 mg by mouth 2 (two) times daily.     oxyCODONE-acetaminophen 5-325 MG per tablet  Commonly known as:  PERCOCET/ROXICET  Take 1 tablet by mouth every 4 (four) hours as needed for pain.     oxyCODONE-acetaminophen 5-325 MG per tablet  Commonly known as:  ROXICET  Take 1 tablet by mouth every 4 (four) hours as needed for pain.     warfarin 4 MG tablet  Commonly known as:  COUMADIN  Take 4 mg by mouth every evening.           Follow-up Information   Follow up with Zena Vitelli V, MD In 3 weeks.   Contact information:   [address] [place] [address] [phone]       Signed: Nadara Mustard 10/25/2012, 6:26 AM

## 2012-10-25 NOTE — Evaluation (Signed)
Occupational Therapy Evaluation Patient Details Name: [name] MRN: [id number] DOB: 06-Apr-1929 Today's Date: 10/25/2012 Time: 1308-6578 OT Time Calculation (min): 31 min  OT Assessment / Plan / Recommendation Clinical Impression  Pt referred to skilled OT services following L BKA. Pt requires assist with LB ADLs and functional mobility. Pt requires no further acute care OT at this time but would benefit from therpay at SNF where he plans to d/c. All education completedd. OT will sign off    OT Assessment  All further OT needs can be met in the next venue of care    Follow Up Recommendations  SNF    Barriers to Discharge  None    Equipment Recommendations  None recommended by OT    Recommendations for Other Services    Frequency       Precautions / Restrictions Precautions Precautions: Fall Restrictions Weight Bearing Restrictions: No   Pertinent Vitals/Pain     ADL  Grooming: Simulated;Wash/dry hands;Wash/dry face;Supervision/safety;Set up Where Assessed - Grooming: Unsupported sitting Upper Body Bathing: Simulated;Supervision/safety;Set up Where Assessed - Upper Body Bathing: Unsupported sitting Lower Body Bathing: Simulated;Minimal assistance Upper Body Dressing: Performed;Supervision/safety;Set up Lower Body Dressing: Performed;Minimal assistance Toilet Transfer: Other (comment) (pt declined OOB activity/transfers) Transfers/Ambulation Related to ADLs: pt states that he uses a sliding board for transfers with supervision from staff at SNF    OT Diagnosis: Generalized weakness;Acute pain  OT Problem List: Decreased strength;Pain;Impaired balance (sitting and/or standing);Decreased activity tolerance;Decreased coordination;Impaired UE functional use OT Treatment Interventions:     OT Goals    Visit Information  Last OT Received On: 10/25/12 PT/OT Co-Evaluation/Treatment: Yes    Subjective Data  Subjective: " I don't feel like any therapy but I will do what I  can " Patient Stated Goal: To return to SNF for therapy   Prior Functioning     Home Living Available Help at Discharge: Skilled Nursing Facility Prior Function Level of Independence: Needs assistance Needs Assistance: Bathing;Dressing;Toileting;Transfers Bath: Minimal Dressing: Minimal Toileting: Minimal Able to Take Stairs?: No Driving: No Vocation: Retired Comments: pt receives Consulting civil engineer at [employer] for ADLs, howeevr pt demos ability to perform ADLs with less assist than is most likely provided at [employer] Communication: No difficulties Dominant Hand: Left (uses R hand, L ring and little finger w/ flexion contracture)         Vision/Perception Vision - History Patient Visual Report: No change from baseline Vision - Assessment Eye Alignment: Within Functional Limits Perception Perception: Within Functional Limits   Cognition  Cognition Overall Cognitive Status: Appears within functional limits for tasks assessed/performed Arousal/Alertness: Awake/alert Orientation Level: Appears intact for tasks assessed Behavior During Session: [facility] for tasks performed    Extremity/Trunk Assessment Right Upper Extremity Assessment RUE ROM/Strength/Tone: [facility] for tasks assessed Left Upper Extremity Assessment LUE ROM/Strength/Tone: WFL for tasks assessed LUE Coordination: Deficits LUE Coordination Deficits: L ring and little finger flexion contractures     Mobility Bed Mobility Bed Mobility: Supine to Sit;Sitting - Scoot to Edge of Bed;Sit to Supine Supine to Sit: 6: Modified independent (Device/Increase time);5: Supervision Sitting - Scoot to Edge of Bed: 6: Modified independent (Device/Increase time);5: Supervision Sit to Supine: 6: Modified independent (Device/Increase time);5: Supervision Details for Bed Mobility Assistance: Pt required assist to be positioned to head of bed Transfers Transfers: Not assessed Details for Transfer Assistance: Pt declined transfers      Exercise     Balance Balance Balance Assessed: Yes Static Sitting Balance Static Sitting - Level of Assistance: 6: Modified independent (Device/Increase  time) Dynamic Sitting Balance Dynamic Sitting - Level of Assistance: 5: Stand by assistance   End of Session OT - End of Session Activity Tolerance: Patient tolerated treatment well Patient left: in bed;with call bell/phone within reach  GO     Galen Manila 10/25/2012, 10:54 AM

## 2012-10-25 NOTE — Progress Notes (Signed)
Patient ID: Notnamed Scholz, male   DOB: 31-Oct-1928, 79 y.o.   MRN: [id number] Plan for discharge back to [facility] skilled nursing facility. Orders and paper work completed for discharge.

## 2012-11-02 DIAGNOSIS — K317 Polyp of stomach and duodenum: Secondary | ICD-10-CM

## 2012-11-02 HISTORY — DX: Polyp of stomach and duodenum: K31.7

## 2012-11-06 ENCOUNTER — Encounter (HOSPITAL_COMMUNITY): Payer: Self-pay | Admitting: *Deleted

## 2012-11-06 ENCOUNTER — Inpatient Hospital Stay (HOSPITAL_COMMUNITY)
Admission: EM | Admit: 2012-11-06 | Discharge: 2012-11-08 | DRG: 394 | Disposition: A | Discharge status: Skilled Nursing Facility | Payer: Medicare Other | Attending: Internal Medicine | Admitting: Internal Medicine

## 2012-11-06 ENCOUNTER — Inpatient Hospital Stay (HOSPITAL_COMMUNITY): Payer: Medicare Other

## 2012-11-06 DIAGNOSIS — A419 Sepsis, unspecified organism: Secondary | ICD-10-CM

## 2012-11-06 DIAGNOSIS — I214 Non-ST elevation (NSTEMI) myocardial infarction: Secondary | ICD-10-CM

## 2012-11-06 DIAGNOSIS — E870 Hyperosmolality and hypernatremia: Secondary | ICD-10-CM

## 2012-11-06 DIAGNOSIS — E11621 Type 2 diabetes mellitus with foot ulcer: Secondary | ICD-10-CM

## 2012-11-06 DIAGNOSIS — Z860100 Personal history of colon polyps, unspecified: Secondary | ICD-10-CM

## 2012-11-06 DIAGNOSIS — E11319 Type 2 diabetes mellitus with unspecified diabetic retinopathy without macular edema: Secondary | ICD-10-CM | POA: Diagnosis present

## 2012-11-06 DIAGNOSIS — K31819 Angiodysplasia of stomach and duodenum without bleeding: Secondary | ICD-10-CM | POA: Diagnosis present

## 2012-11-06 DIAGNOSIS — E1139 Type 2 diabetes mellitus with other diabetic ophthalmic complication: Secondary | ICD-10-CM | POA: Diagnosis present

## 2012-11-06 DIAGNOSIS — C61 Malignant neoplasm of prostate: Secondary | ICD-10-CM

## 2012-11-06 DIAGNOSIS — K922 Gastrointestinal hemorrhage, unspecified: Secondary | ICD-10-CM

## 2012-11-06 DIAGNOSIS — R1310 Dysphagia, unspecified: Secondary | ICD-10-CM

## 2012-11-06 DIAGNOSIS — E039 Hypothyroidism, unspecified: Secondary | ICD-10-CM | POA: Diagnosis present

## 2012-11-06 DIAGNOSIS — L98499 Non-pressure chronic ulcer of skin of other sites with unspecified severity: Secondary | ICD-10-CM

## 2012-11-06 DIAGNOSIS — E1149 Type 2 diabetes mellitus with other diabetic neurological complication: Secondary | ICD-10-CM | POA: Diagnosis present

## 2012-11-06 DIAGNOSIS — Z8546 Personal history of malignant neoplasm of prostate: Secondary | ICD-10-CM

## 2012-11-06 DIAGNOSIS — D649 Anemia, unspecified: Secondary | ICD-10-CM | POA: Diagnosis present

## 2012-11-06 DIAGNOSIS — Z794 Long term (current) use of insulin: Secondary | ICD-10-CM

## 2012-11-06 DIAGNOSIS — I519 Heart disease, unspecified: Secondary | ICD-10-CM

## 2012-11-06 DIAGNOSIS — Z79899 Other long term (current) drug therapy: Secondary | ICD-10-CM

## 2012-11-06 DIAGNOSIS — R5381 Other malaise: Secondary | ICD-10-CM

## 2012-11-06 DIAGNOSIS — R918 Other nonspecific abnormal finding of lung field: Secondary | ICD-10-CM

## 2012-11-06 DIAGNOSIS — I10 Essential (primary) hypertension: Secondary | ICD-10-CM | POA: Diagnosis present

## 2012-11-06 DIAGNOSIS — L03119 Cellulitis of unspecified part of limb: Secondary | ICD-10-CM

## 2012-11-06 DIAGNOSIS — E785 Hyperlipidemia, unspecified: Secondary | ICD-10-CM | POA: Diagnosis present

## 2012-11-06 DIAGNOSIS — Z681 Body mass index (BMI) 19 or less, adult: Secondary | ICD-10-CM

## 2012-11-06 DIAGNOSIS — R791 Abnormal coagulation profile: Secondary | ICD-10-CM

## 2012-11-06 DIAGNOSIS — R531 Weakness: Secondary | ICD-10-CM | POA: Diagnosis present

## 2012-11-06 DIAGNOSIS — I739 Peripheral vascular disease, unspecified: Secondary | ICD-10-CM | POA: Diagnosis present

## 2012-11-06 DIAGNOSIS — Z7982 Long term (current) use of aspirin: Secondary | ICD-10-CM

## 2012-11-06 DIAGNOSIS — K920 Hematemesis: Secondary | ICD-10-CM | POA: Diagnosis present

## 2012-11-06 DIAGNOSIS — E1142 Type 2 diabetes mellitus with diabetic polyneuropathy: Secondary | ICD-10-CM | POA: Diagnosis present

## 2012-11-06 DIAGNOSIS — Z87891 Personal history of nicotine dependence: Secondary | ICD-10-CM

## 2012-11-06 DIAGNOSIS — L89159 Pressure ulcer of sacral region, unspecified stage: Secondary | ICD-10-CM

## 2012-11-06 DIAGNOSIS — E119 Type 2 diabetes mellitus without complications: Secondary | ICD-10-CM | POA: Diagnosis present

## 2012-11-06 DIAGNOSIS — D131 Benign neoplasm of stomach: Principal | ICD-10-CM | POA: Diagnosis present

## 2012-11-06 DIAGNOSIS — E1165 Type 2 diabetes mellitus with hyperglycemia: Secondary | ICD-10-CM

## 2012-11-06 DIAGNOSIS — E13622 Other specified diabetes mellitus with other skin ulcer: Secondary | ICD-10-CM

## 2012-11-06 DIAGNOSIS — E440 Moderate protein-calorie malnutrition: Secondary | ICD-10-CM | POA: Diagnosis present

## 2012-11-06 DIAGNOSIS — D62 Acute posthemorrhagic anemia: Secondary | ICD-10-CM | POA: Diagnosis present

## 2012-11-06 DIAGNOSIS — K219 Gastro-esophageal reflux disease without esophagitis: Secondary | ICD-10-CM | POA: Diagnosis present

## 2012-11-06 DIAGNOSIS — E876 Hypokalemia: Secondary | ICD-10-CM

## 2012-11-06 DIAGNOSIS — S88119A Complete traumatic amputation at level between knee and ankle, unspecified lower leg, initial encounter: Secondary | ICD-10-CM

## 2012-11-06 HISTORY — DX: Malignant neoplasm of prostate: C61

## 2012-11-06 LAB — PROTIME-INR
INR: 3.52 — ABNORMAL HIGH (ref 0.00–1.49)
INR: 1.22 (ref 0.00–1.49)
Prothrombin Time: 33.3 seconds — ABNORMAL HIGH (ref 11.6–15.2)
Prothrombin Time: 15.2 seconds (ref 11.6–15.2)

## 2012-11-06 LAB — GLUCOSE, CAPILLARY
Glucose-Capillary: 263 mg/dL — ABNORMAL HIGH (ref 70–99)
Glucose-Capillary: 229 mg/dL — ABNORMAL HIGH (ref 70–99)

## 2012-11-06 LAB — PREPARE RBC (CROSSMATCH)

## 2012-11-06 LAB — CBC
Hemoglobin: 6.4 g/dL — CL (ref 13.0–17.0)
MCH: 30.3 pg (ref 26.0–34.0)
MCHC: 33.3 g/dL (ref 30.0–36.0)
Platelets: 273 10*3/uL (ref 150–400)
RDW: 18.9 % — ABNORMAL HIGH (ref 11.5–15.5)

## 2012-11-06 LAB — BASIC METABOLIC PANEL
CO2: 28 mEq/L (ref 19–32)
Calcium: 8.3 mg/dL — ABNORMAL LOW (ref 8.4–10.5)
GFR calc Af Amer: >90 mL/min (ref >90)
GFR calc non Af Amer: 78 mL/min — ABNORMAL LOW (ref >90)
Sodium: 135 mEq/L (ref 135–145)

## 2012-11-06 LAB — CBC WITH DIFFERENTIAL/PLATELET
Basophils Absolute: 0 10*3/uL (ref 0.0–0.1)
Basophils Relative: 0 % (ref 0–1)
Eosinophils Relative: 6 % — ABNORMAL HIGH (ref 0–5)
Lymphocytes Relative: 15 % (ref 12–46)
MCV: 97 fL (ref 78.0–100.0)
Neutro Abs: 14 10*3/uL — ABNORMAL HIGH (ref 1.7–7.7)
Platelets: 373 10*3/uL (ref 150–400)
RDW: 20.6 % — ABNORMAL HIGH (ref 11.5–15.5)
WBC: 19 10*3/uL — ABNORMAL HIGH (ref 4.0–10.5)

## 2012-11-06 LAB — APTT: aPTT: 70 seconds — ABNORMAL HIGH (ref 24–37)

## 2012-11-06 LAB — MRSA PCR SCREENING: MRSA by PCR: POSITIVE — AB

## 2012-11-06 MED ORDER — ONDANSETRON HCL 4 MG/2ML IJ SOLN: 4.0000 mg | Freq: Four times a day (QID) | INTRAMUSCULAR | Status: DC | PRN

## 2012-11-06 MED ORDER — PANTOPRAZOLE SODIUM 40 MG IV SOLR: 40.0000 mg | Freq: Once | INTRAVENOUS | Status: DC

## 2012-11-06 MED ORDER — SODIUM CHLORIDE 0.9 % IV SOLN
INTRAVENOUS | Status: DC
  Administered 2012-11-07 – 2012-11-08 (×3): via INTRAVENOUS

## 2012-11-06 MED ORDER — PANTOPRAZOLE SODIUM 40 MG IV SOLR
8.0000 mg/h | INTRAVENOUS | Status: DC
  Administered 2012-11-06 – 2012-11-08 (×4): 8 mg/h via INTRAVENOUS
  Filled 2012-11-06 (×10): qty 80

## 2012-11-06 MED ORDER — MIRTAZAPINE 7.5 MG PO TABS
7.5000 mg | ORAL_TABLET | Freq: Every day | ORAL | Status: DC
  Administered 2012-11-07: 7.5 mg via ORAL
  Filled 2012-11-06 (×3): qty 1

## 2012-11-06 MED ORDER — ALBUTEROL SULFATE HFA 108 (90 BASE) MCG/ACT IN AERS: 2.0000 | INHALATION_SPRAY | Freq: Four times a day (QID) | RESPIRATORY_TRACT | Status: DC | PRN

## 2012-11-06 MED ORDER — MUPIROCIN 2 % EX OINT
1.0000 "application " | TOPICAL_OINTMENT | Freq: Two times a day (BID) | CUTANEOUS | Status: DC
  Administered 2012-11-06 – 2012-11-08 (×4): 1 via NASAL
  Filled 2012-11-06 (×3): qty 22

## 2012-11-06 MED ORDER — SODIUM CHLORIDE 0.9 % IV SOLN
80.0000 mg | Freq: Once | INTRAVENOUS | Status: AC
  Administered 2012-11-06: 80 mg via INTRAVENOUS
  Filled 2012-11-06: qty 80

## 2012-11-06 MED ORDER — ONDANSETRON HCL 4 MG PO TABS: 4.0000 mg | ORAL_TABLET | Freq: Four times a day (QID) | ORAL | Status: DC | PRN

## 2012-11-06 MED ORDER — DEXTROSE-NACL 5-0.9 % IV SOLN
INTRAVENOUS | Status: DC
  Administered 2012-11-06 (×2): via INTRAVENOUS

## 2012-11-06 MED ORDER — SODIUM CHLORIDE 0.9 % IV SOLN: INTRAVENOUS | Status: AC

## 2012-11-06 MED ORDER — VITAMIN K1 10 MG/ML IJ SOLN
5.0000 mg | Freq: Once | INTRAVENOUS | Status: AC
  Administered 2012-11-06: 5 mg via INTRAVENOUS
  Filled 2012-11-06: qty 0.5

## 2012-11-06 MED ORDER — LEVOTHYROXINE SODIUM 88 MCG PO TABS
88.0000 ug | ORAL_TABLET | Freq: Every day | ORAL | Status: DC
  Administered 2012-11-06 – 2012-11-08 (×3): 88 ug via ORAL
  Filled 2012-11-06 (×4): qty 1

## 2012-11-06 MED ORDER — SODIUM CHLORIDE 0.9 % IJ SOLN
3.0000 mL | Freq: Two times a day (BID) | INTRAMUSCULAR | Status: DC
  Administered 2012-11-06 – 2012-11-07 (×4): 3 mL via INTRAVENOUS

## 2012-11-06 MED ORDER — CHLORHEXIDINE GLUCONATE CLOTH 2 % EX PADS
6.0000 | MEDICATED_PAD | Freq: Every day | CUTANEOUS | Status: DC
  Administered 2012-11-06 – 2012-11-08 (×3): 6 via TOPICAL

## 2012-11-06 MED ORDER — INSULIN ASPART 100 UNIT/ML ~~LOC~~ SOLN
0.0000 [IU] | SUBCUTANEOUS | Status: DC
  Administered 2012-11-06 (×2): 3 [IU] via SUBCUTANEOUS
  Administered 2012-11-06: 5 [IU] via SUBCUTANEOUS
  Administered 2012-11-06: 3 [IU] via SUBCUTANEOUS
  Administered 2012-11-06: 5 [IU] via SUBCUTANEOUS
  Administered 2012-11-07: 7 [IU] via SUBCUTANEOUS
  Administered 2012-11-07 (×2): 3 [IU] via SUBCUTANEOUS
  Administered 2012-11-07 – 2012-11-08 (×3): 2 [IU] via SUBCUTANEOUS

## 2012-11-06 NOTE — Progress Notes (Signed)
Clinical Child psychotherapist (CSW) received a call from pt insurance [facility]) and informed that pt will not need an insurance authorization to return to [facility]. CSW to remain following for dc planning.  Theresia Bough, MSW, Theresia Majors [phone]

## 2012-11-06 NOTE — Progress Notes (Signed)
Inpatient Diabetes Program Recommendations  AACE/ADA: New Consensus Statement on Inpatient Glycemic Control (2013)  Target Ranges:  Prepandial:   less than 140 mg/dL      Peak postprandial:   less than 180 mg/dL (1-2 hours)      Critically ill patients:  140 - 180 mg/dL  Results for ESTLE, HUGULEY (MRN [id number]) as of 11/06/2012 10:08  Ref. Range 11/06/2012 04:32 11/06/2012 07:51  Glucose-Capillary Latest Range: 70-99 mg/dL 409 (H) 811 (H)   Inpatient Diabetes Program Recommendations Insulin - Basal: Start Lantus 10 units (home dose per med rec) Will follow. Thank you  Piedad Climes BSN, RN,CDE Inpatient Diabetes Coordinator [phone] (team pager)

## 2012-11-06 NOTE — Progress Notes (Signed)
[facility] Consult: 10:44 AM 11/06/2012   Referring [name]: [name]  Primary Care Physician:  [name] Boozer, MD Primary Gastroenterologist:  Dr. Claudette [name]  Reason for Consultation:  hemetemesis  HPI: [name] is a 79 y.o. male.  Hx esophageal candidiasis 2013, Hyperplastic colon polyps 2009, 2013, treated H Pylori in 2009.  IDDM.  S/P Bil BKAs.  Started coumadin in Jan 2014 after left BKA.  Transfused with blood then as well, for post op anemia.  IDDM.  Appetite not good but no nausea, early satiety or dysphagia.  No heart burn. Stools dark because he takes po Iron chronically. No abdominal or esophageal pain.  Yesterday evening had 2 episodes of hemetemesis, producing clots of blood.  Volume was maybe 30 cc or so.  Had 2 less bloody episodes this AM. Started on Protonix drip.  AT SNF takes 20 mg Omeprazole, 81 mg ASA as well as Warfarin.   His Hgb was 5.2, getting second of 2 units PRBCs now. INR was 3.5. Got 5 mg IV Vitamin K.   Post AKA office visit with Dr Lajoyce Corners on 11/04/12 where all was deemed well.  Due back there for staple removal 11/11/12.    Past Medical History  Diagnosis Date  . Thyroid disease   . Anemia   . Hyperlipidemia   . Neuropathy, diabetic   . Diabetic retinopathy   . GERD (gastroesophageal reflux disease)   . Arthritis   . Urgency of urination   . Hypothyroidism   . Sacral wound     hx of  . Cancer   . Diabetic retinopathy   . Peripheral vascular disease   . Diabetes mellitus     sees Dr. Chilton Si [facility])  . CHF (congestive heart failure)   . Hypertension   . Prostate cancer     Past Surgical History  Procedure Laterality Date  . Cataract extraction, bilateral    . Colonoscopy w/ polypectomy    . Esophagogastroduodenoscopy  04/25/2012    Procedure: ESOPHAGOGASTRODUODENOSCOPY (EGD);  Surgeon: [name] Dare, MD,FACG;  Location: [facility];  Service: Endoscopy;  Laterality: N/A;   . Amputation  05/16/2012    Procedure: AMPUTATION BELOW KNEE;  Surgeon: [name] Mustard, MD;  Location: [facility];  Service: Orthopedics;  Laterality: Right;  Right Below Knee Amputation  . Tonsillectomy    . Foot amputation      right bka  . Eye surgery      bilateral  . Amputation  09/14/2012    Procedure: AMPUTATION BELOW KNEE;  Surgeon: [name] Mustard, MD;  Location: [facility] OR;  Service: Orthopedics;  Laterality: Left;  . Amputation Left 10/23/2012    Procedure: Left below knee amputation revision;  Surgeon: [name] Mustard, MD;  Location: [facility];  Service: Orthopedics;  Laterality: Left;  left below knee amputation revision    Prior to Admission medications   Medication Sig Start Date End Date Taking? Authorizing Yoshino Broccoli  albuterol (PROVENTIL HFA;VENTOLIN HFA) 108 (90 BASE) MCG/ACT inhaler Inhale 2 puffs into the lungs every 6 (six) hours as needed for wheezing.    Historical Medea Deines, MD  aspirin 81 MG chewable tablet Chew 1 tablet (81 mg total) by mouth daily. 04/26/12 04/26/13  [name] Blanks Minor, NP  docusate sodium (COLACE) 100 MG capsule Take 100 mg by mouth 2 (two) times daily.    Historical [name] Geving, MD  feeding supplement (PRO-STAT SUGAR FREE 64) LIQD Take 30 mLs by mouth 2 (two) times daily.    Historical [name] Tello, MD  ferrous sulfate 325 (65 FE) MG tablet Take 325 mg by mouth daily with breakfast.    Historical [name] Polio, MD  insulin glargine (LANTUS) 100 UNIT/ML injection Inject 10 Units into the skin at bedtime.    Historical [name] Totaro, MD  insulin NPH-insulin regular (HUMULIN 70/30) (70-30) 100 UNIT/ML injection Inject 20 Units into the skin daily with breakfast.    Historical [name] Grills, MD  ipratropium (ATROVENT) 0.02 % nebulizer solution Take 0.5 mg by nebulization every 2 (two) hours as needed. For shortness of breath 04/26/12 04/26/13  [name] Blanks Minor, NP  levothyroxine (SYNTHROID, LEVOTHROID) 88 MCG tablet Take 88 mcg by mouth daily.    Historical [name] Gieske, MD  metoprolol tartrate (LOPRESSOR)  25 MG tablet Take 12.5 mg by mouth 2 (two) times daily.    Historical [name] Norrod, MD  mirtazapine (REMERON) 15 MG tablet Take 7.5 mg by mouth at bedtime.     Historical [name] Olden, MD  omeprazole (PRILOSEC) 20 MG capsule Take 20 mg by mouth 2 (two) times daily.    Historical [name] Doten, MD  oxyCODONE-acetaminophen (PERCOCET/ROXICET) 5-325 MG per tablet Take 1 tablet by mouth every 4 (four) hours as needed for pain. 09/16/12   [name] Mustard, MD  oxyCODONE-acetaminophen (ROXICET) 5-325 MG per tablet Take 1 tablet by mouth every 4 (four) hours as needed for pain. 10/25/12   [name] Mustard, MD  warfarin (COUMADIN) 4 MG tablet Take 4 mg by mouth every evening.    Historical [name] Rog, MD    Scheduled Meds: . sodium chloride   Intravenous STAT  . Chlorhexidine Gluconate Cloth  6 each Topical Q0600  . insulin aspart  0-9 Units Subcutaneous Q4H  . levothyroxine  88 mcg Oral QAC breakfast  . mirtazapine  7.5 mg Oral QHS  . mupirocin ointment  1 application Nasal BID  . sodium chloride  3 mL Intravenous Q12H   Infusions: . dextrose 5 % and 0.9% NaCl 75 mL/hr at 11/06/12 0436  . pantoprozole (PROTONIX) infusion 8 mg/hr (11/06/12 0501)   PRN Meds: albuterol, ondansetron (ZOFRAN) IV, ondansetron   Allergies as of 11/06/2012 - Review Complete 11/06/2012  Allergen Reaction Noted  . Bactrim (sulfamethoxazole w-trimethoprim) Swelling 05/26/2012  . Doxycycline Swelling 05/26/2012    Family History  Problem Relation Age of Onset  . Hypertension      History   Social History  . Marital Status: Married    Spouse Name: N/A    Number of Children: N/A  . Years of Education: N/A   Occupational History  . 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 Smoking status: Former Smoker -- 0.25 packs/day    Types: Cigarettes    Quit date: 11/22/2006  . Smokeless tobacco: Never Used  . Alcohol Use: 0.0 oz/week    1-2 Cans of beer per week     Comment: drinks with meals  . Drug Use: No  .  Sexually Active: No     REVIEW OF SYSTEMS: Constitutional:  No diminished or increased abd girth ENT:  No nose bleeds Pulm:  No SOB or cough CV:  No chest pain or palps GU:  Nocturia 2 x per noc.  No incmplete voiding or blood in urine. GI:  Per HPI Heme:  Taking iron for years Transfusions:  Transfusions on various occasions in past..    Neuro:  No headache or seizures Derm:  Slight non-bloody oozing from left AKA site, not purulent or bloody Endocrine:  No excessive thirst, no sweats Immunization:  Flu shot current Travel:  none   PHYSICAL EXAM: Vital signs in last 24 hours: Temp:  [97.8 F (36.6 C)-98.3 F (36.8 C)] 98.3 F (36.8 C) (03/05 0730) Pulse Rate:  [72-82] 82 (03/05 0730) Resp:  [10-22] 10 (03/05 0730) BP: (92-141)/(35-55) 106/46 mmHg (03/05 0730) SpO2:  [95 %-100 %] 100 % (03/05 0400) Weight:  [59.3 kg (130 lb 11.7 oz)] 59.3 kg (130 lb 11.7 oz) (03/05 0400)  General: looks well [name]:  No trauma or swelling  Eyes:  No icterus or pallor Ears:  Not HOH  Nose:  No bleeding Mouth:  Moist, clear, pink.  One tooth remains Neck:  No JVD, no mass Lungs:  Clear.  No labored breathing Heart: RRR Abdomen:  Soft, NT, ND.  No mass or HSM.  There is mild distention Rectal: not done   Musc/Skeltl: BKA B Extremities:  No UE edema  Neurologic:  Oriented x 3.  No tremor moves all 4 limbs Skin:  No rash, no angioma Tattoos:  none Nodes:  No inguinal adenopathy   Psych:  Pleasant cooperative.   Intake/Output from previous day: 03/04 0701 - 03/05 0700 In: 1292.1 [I.V.:479.6; Blood:812.5] Out: 175 [Urine:75; Emesis/NG output:100] Intake/Output this shift: Total I/O In: 150 [Blood:150] Out: -   LAB RESULTS:  Recent Labs  11/06/12 0048  WBC 19.0*  HGB 5.2*  HCT 15.9*  PLT 373   BMET Lab Results  Component Value Date   NA 135 11/06/2012   NA 135 10/24/2012   NA 138 10/23/2012   K 3.8 11/06/2012   K 4.4 10/24/2012   K 3.8 10/23/2012   CL 99 11/06/2012   CL 100  10/24/2012   CL 100 10/23/2012   CO2 28 11/06/2012   CO2 26 10/24/2012   CO2 30 10/23/2012   GLUCOSE 243* 11/06/2012   GLUCOSE 435* 10/24/2012   GLUCOSE 241* 10/23/2012   BUN 40* 11/06/2012   BUN 23 10/24/2012   BUN 25* 10/23/2012   CREATININE 0.89 11/06/2012   CREATININE 0.81 10/24/2012   CREATININE 0.87 10/23/2012   CALCIUM 8.3* 11/06/2012   CALCIUM 8.0* 10/24/2012   CALCIUM 8.8 10/23/2012   LFT No results found for this basename: PROT, ALBUMIN, AST, ALT, ALKPHOS, BILITOT, BILIDIR, IBILI,  in the last 72 hours PT/INR Lab Results  Component Value Date   INR 3.52* 11/06/2012   INR 2.24* 10/25/2012   INR 1.77* 10/24/2012      RADIOLOGY STUDIES: No results found.  ENDOSCOPIC STUDIES: 04/2012 EGD  MT Stark for dysphagia at [facility] 1. White esophageal exudates consistent with candidiasis; multiple biopsies  2. Pedunculated polyp measuring 1.5 cm in size in the gastric antrum; multiple biopsies  3. Atrophic gastritis  4. Duodenitis Pathology: 1. Stomach, biopsy - CHRONIC INFLAMMATION WITH GLANDULAR ATROPHY AND INTESTINAL METAPLASIA ASSOCIATED WITH FOCAL EROSION. NO HELICOBACTER PYLORI, DYSPLASIA OR MALIGNANCY. 2. Esophagus, biopsy - BENIGN ESOPHAGEAL SQUAMOUS MUCOSA WITH FUNGAL FORMS CONSISTENT WITH CANDIDA. NO INTESTINAL METAPLASIA, DYSPLASIA OR MALIGNANCY.  07/2008  EGD AVMs, gastric hyperplastic polyp  STOMACH, ANTRAL, POLYPS, BIOPSY: - HYPERPLASTIC POLYP WITH FOCAL LOW GRADE DYSPLASIA AND EXTENSIVE INTESTINAL METAPLASIA. NO EVIDENCE OF HIGH GRADE DYSPLASIA OR MALIGNANCY IDENTIFIED. - ROD-SHAPED BACILLI IN THE ADJACENT GASTRIC ANTRAL MUCOSA, CONSISTENT WITH HELICOBACTER PYLORI GASTRITIS. PLEASE SEE COMMENT. H Pylori treated with Prevpac 2009   IMPRESSION: *  Hematemesis.  Rule out ulcer or AVM or MW tear.  *  Hx esophageal candidiasis 2013 *  Hx gastric polyps 2009, 04/2012. Hyperplastic pathology.  *  IDDM.  Looks to be poorly controlled, no recent Hgb A1c *  S/P left BKA 09/14/12 for  osteomyelitis.  Revision of amputation due to abcess on 10/23/12.   Right BKA in 05/2012.  *  Coumadin, started 09/2012 post op.  INR supratherapeutic at 3.5.  5mg  vit *  ABL anemia.  Post op anemia as well.  S/p 2 PRBCs in Feb 2014.  On po Iron at home *  Peripheral artery disease.  *  Prostate cancer, labeled as Gleason 6 in 2009 note. Does not seem to be an active problem   PLAN: *  EGD when coags normalize.   I see that FFP is available to transfuse.  I went ahead and reordered CBC an coags for now. Continue the PPI drip for now, along with NPO   LOS: 0 days   Jennye Moccasin  11/06/2012, 10:44 AM Pager: [phone]  Chart was reviewed and patient was examined. X-rays and lab were reviewed.    I agree with management and plans.  He should has had limited upper GI bleeding exacerbated by coagulopathy. Etiology for nausea and vomiting is uncertain. He has mild abdominal distention but is without tenderness.  Plan as above. We'll check abdominal series as well.  Barbette Hair. Arlyce Dice, M.D., [facility] Cell [phone]

## 2012-11-06 NOTE — ED Notes (Signed)
Per EMS: pt from [place]. Pt vomited blood x1 today. Pt was also taken to bathroom and found to be impacted. Pt has no complaints and is also wanting to rest. Pt per normal from nursing home to EMS.

## 2012-11-06 NOTE — Progress Notes (Signed)
[facility] PROGRESS NOTE Interim History: 79 y.o. male with hx of anemia, DM and diabetic complications, s/p recent left BKA, and is on coumadin probably for DVT prophylaxis, hx of gastritis and gastric polyp by EGD 04/2012 by Dr Lamont Snowball, hypothyroidism, presents to the ER with hematemesis of blood and clots. He was borderline hypotensive with SBP 90's. He was found to have a Hb of 5.2 grams /DL, admitted to SDU. Was given FFP and vit K. EGD 3.5.2014.   Assessment/Plan:  Acute blood loss anemia/  *Hematemesis/vomiting blood - S/p 2 units transfusion 3.5.2014. - VS stable. NPO EGD possibly 3.5.2014. - currently on coumadin INR 3.5 (see below).  Supratherapeutic INR - Vit K and FFP. - recheck INR   Diabetes mellitus - On SSI, NPO. - BG high.   Weakness - 2/2 to #1.    Hypothyroidism: - cont synthroid.    Code Status: full Family Communication: none  Disposition Plan: SDU   Consultants:  GI  Procedures:  EGD 3.5.2014  Antibiotics:  none (indicate start date, and stop date if known)  HPI/Subjective: Vomited this am. Still feels weak. Patient currently NPO. No abdominal pain denies NSAID's use.  Objective: Filed Vitals:   11/06/12 0400 11/06/12 0500 11/06/12 0530 11/06/12 0630  BP: 116/44 113/46 109/55 110/47  Pulse: 80 72 77 82  Temp: 98 F (36.7 C) 97.8 F (36.6 C) 97.8 F (36.6 C) 97.9 F (36.6 C)  TempSrc: Oral Oral Oral Oral  Resp: 14 15 12 10   Height: 6' (1.829 m)     Weight: 59.3 kg (130 lb 11.7 oz)     SpO2: 100%       Intake/Output Summary (Last 24 hours) at 11/06/12 0710 Last data filed at 11/06/12 0630  Gross per 24 hour  Intake 1167.5 ml  Output    175 ml  Net  992.5 ml   Filed Weights   11/06/12 0400  Weight: 59.3 kg (130 lb 11.7 oz)    Exam:  General: Alert, awake, oriented x3, in no acute distress cachectic appearing HEENT: No bruits, no goiter.  Heart: Regular rate and rhythm, without murmurs, rubs, gallops.  Lungs:  Good air movement, bilateral air movement.  Abdomen: Soft, nontender, nondistended, positive bowel sounds.  Ext:  B/l below the knee amputation.   Data Reviewed: Basic Metabolic Panel:  Recent Labs Lab 11/06/12 0048  NA 135  K 3.8  CL 99  CO2 28  GLUCOSE 243*  BUN 40*  CREATININE 0.89  CALCIUM 8.3*   Liver Function Tests: No results found for this basename: AST, ALT, ALKPHOS, BILITOT, PROT, ALBUMIN,  in the last 168 hours No results found for this basename: LIPASE, AMYLASE,  in the last 168 hours No results found for this basename: AMMONIA,  in the last 168 hours CBC:  Recent Labs Lab 11/06/12 0048  WBC 19.0*  NEUTROABS 14.0*  HGB 5.2*  HCT 15.9*  MCV 97.0  PLT 373   Cardiac Enzymes: No results found for this basename: CKTOTAL, CKMB, CKMBINDEX, TROPONINI,  in the last 168 hours BNP (last 3 results)  Recent Labs  04/11/12 0420 04/16/12 1020  PROBNP 3477.0* 2493.0*   CBG: No results found for this basename: GLUCAP,  in the last 168 hours  Recent Results (from the past 240 hour(s))  MRSA PCR SCREENING     Status: Abnormal   Collection Time    11/06/12  4:43 AM      Result Value Range Status   MRSA by PCR  POSITIVE (*) NEGATIVE Final   Comment:            The GeneXpert MRSA Assay (FDA     approved for NASAL specimens     only), is one component of a     comprehensive MRSA colonization     surveillance program. It is not     intended to diagnose MRSA     infection nor to guide or     monitor treatment for     MRSA infections.     RESULT CALLED TO, READ BACK BY AND VERIFIED WITH:     CONNER,S RN [id number] AT 0622 SKEEN,P     Studies: No results found.  Scheduled Meds: . sodium chloride   Intravenous STAT  . insulin aspart  0-9 Units Subcutaneous Q4H  . levothyroxine  88 mcg Oral QAC breakfast  . mirtazapine  7.5 mg Oral QHS  . sodium chloride  3 mL Intravenous Q12H   Continuous Infusions: . dextrose 5 % and 0.9% NaCl 75 mL/hr at 11/06/12 0436  .  pantoprozole (PROTONIX) infusion 8 mg/hr (11/06/12 0501)     Marinda Elk  [facility] Pager [phone]. If 8PM-8AM, please contact night-coverage at [email or web address], password [facility] 11/06/2012, 7:10 AM  LOS: 0 days

## 2012-11-06 NOTE — Progress Notes (Signed)
INITIAL NUTRITION ASSESSMENT  DOCUMENTATION CODES Per approved criteria  -Not Applicable   INTERVENTION: Diet advancement per MD as able.  NUTRITION DIAGNOSIS: Inadequate oral intake related to altered GI function as evidenced by NPO status.   Goal: Intake to meet >90% of estimated nutrition needs.  Monitor:  Diet advancement; PO intake; labs; weight trend.  Reason for Assessment: MST=3  79 y.o. male  Admitting Dx: Hematemesis/vomiting blood  ASSESSMENT: Patient was admitted with suspected upper GI bleed.  GI following.  Plans for EGD when coags normalize to rule out ulcer, AVM, or MW tear.  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  Suspect this is the cause of recent weight loss.  Patient remains NPO due to GI bleed at this time.  Height: Ht Readings from Last 1 Encounters:  11/06/12 6' (1.829 m)    Weight: Wt Readings from Last 1 Encounters:  11/06/12 130 lb 11.7 oz (59.3 kg)    Ideal Body Weight: 71.4 kg  % Ideal Body Weight: 83%  Wt Readings from Last 10 Encounters:  11/06/12 130 lb 11.7 oz (59.3 kg)  10/22/12 132 lb 8 oz (60.102 kg)  10/22/12 132 lb 8 oz (60.102 kg)  09/13/12 140 lb (63.504 kg)  09/13/12 140 lb (63.504 kg)  09/13/12 140 lb (63.504 kg)  08/21/12 134 lb (60.782 kg)  07/29/12 132 lb (59.875 kg)  07/29/12 132 lb (59.875 kg)  07/24/12 134 lb (60.782 kg)    Usual Body Weight: 132 lb.  % Usual Body Weight: 98% (2% weight loss in 2 weeks)  BMI:  20 (using adjusted equation for amputations)  Estimated Nutritional Needs: Kcal: 1750-1850 Protein: 80-95 gm Fluid: 1.8-2 L  Skin: diabetic ulcer on right leg; surgical incision on left leg from recent revision of left BKA.  Diet Order: NPO  EDUCATION NEEDS: -Education not appropriate at this time   Intake/Output Summary (Last 24 hours) at 11/06/12 1215 Last data filed at 11/06/12 1115  Gross per 24 hour  Intake 2054.58 ml  Output    275 ml  Net 1779.58 ml     Labs:   Recent Labs Lab 11/06/12 0048  NA 135  K 3.8  CL 99  CO2 28  BUN 40*  CREATININE 0.89  CALCIUM 8.3*  GLUCOSE 243*    CBG (last 3)   Recent Labs  11/06/12 0432 11/06/12 0751  GLUCAP 263* 280*    Scheduled Meds: . sodium chloride   Intravenous STAT  . Chlorhexidine Gluconate Cloth  6 each Topical Q0600  . insulin aspart  0-9 Units Subcutaneous Q4H  . levothyroxine  88 mcg Oral QAC breakfast  . mirtazapine  7.5 mg Oral QHS  . mupirocin ointment  1 application Nasal BID  . sodium chloride  3 mL Intravenous Q12H    Continuous Infusions: . dextrose 5 % and 0.9% NaCl 75 mL/hr at 11/06/12 1100  . pantoprozole (PROTONIX) infusion 8 mg/hr (11/06/12 1100)    Past Medical History  Diagnosis Date  . Thyroid disease   . Anemia   . Hyperlipidemia   . Neuropathy, diabetic   . Diabetic retinopathy   . GERD (gastroesophageal reflux disease)   . Arthritis   . Urgency of urination   . Hypothyroidism   . Sacral wound     hx of  . Cancer   . Diabetic retinopathy   . Peripheral vascular disease   . Diabetes mellitus     sees Dr. Chilton Si [facility])  . CHF (congestive heart failure)   .  Hypertension   . Prostate cancer     Past Surgical History  Procedure Laterality Date  . Cataract extraction, bilateral    . Colonoscopy w/ polypectomy    . Esophagogastroduodenoscopy  04/25/2012    Procedure: ESOPHAGOGASTRODUODENOSCOPY (EGD);  Surgeon: Meryl Dare, MD,FACG;  Location: [facility];  Service: Endoscopy;  Laterality: N/A;  . Amputation  05/16/2012    Procedure: AMPUTATION BELOW KNEE;  Surgeon: Nadara Mustard, MD;  Location: [facility];  Service: Orthopedics;  Laterality: Right;  Right Below Knee Amputation  . Tonsillectomy    . Foot amputation      right bka  . Eye surgery      bilateral  . Amputation  09/14/2012    Procedure: AMPUTATION BELOW KNEE;  Surgeon: Nadara Mustard, MD;  Location: [facility] OR;  Service: Orthopedics;  Laterality: Left;  . Amputation Left  10/23/2012    Procedure: Left below knee amputation revision;  Surgeon: Nadara Mustard, MD;  Location: [facility] OR;  Service: Orthopedics;  Laterality: Left;  left below knee amputation revision     Joaquin Courts, RD, LDN, CNSC Pager# [phone] After Hours Pager# [phone]

## 2012-11-06 NOTE — ED Notes (Signed)
Pt vomited again while being applied to monitored. Pt vomit is dark red in color. Pt still denies pain.

## 2012-11-06 NOTE — Clinical Social Work Psychosocial (Signed)
     Clinical Social Work Department BRIEF PSYCHOSOCIAL ASSESSMENT 11/06/2012  Patient:  [name],[name]     Account Number:  [id number]     Admit date:  11/06/2012  Clinical Social Worker:  Lourdes Sledge  Date/Time:  11/06/2012 11:01 AM  Referred by:  CSW  Date Referred:  11/06/2012 Referred for  SNF Placement  SNF Placement   Other Referral:   Interview type:  Patient Other interview type:    PSYCHOSOCIAL DATA Living Status:  FACILITY Admitted from facility:  [facility] Level of care:  Skilled Nursing Facility Primary support name:  [name] [phone] Primary support relationship to patient:  SPOUSE Degree of support available:   Pt appears to have significant family support.    CURRENT CONCERNS Current Concerns  Post-Acute Placement   Other Concerns:    SOCIAL WORK ASSESSMENT / PLAN CSW informed in progression that pt was admitted from [facility].    CSW visited pt room and introduced herself and role. Pt confirmed he is from [place] and the plan is for him to return when medically ready.    CSW contacted [name] at [facility] and confirmed pt is a LT resident and is able to return when medically ready. CSW inquired whether pt is using his Medicaid or whether [name] is his current payor source. CSW informed that pt has used all of his Coverntry days.    CSW left a message with [name] [phone] ext. [phone] to determine whether pt will need PT/OT eval for insurance authorization.   Assessment/plan status:  Psychosocial Support/Ongoing Assessment of Needs Other assessment/ plan:   Information/referral to community resources:   No resources needed at this time. CSW will provide resources as appropriate.    PATIENTS/FAMILYS RESPONSE TO PLAN OF CARE: Pt laying in bed alert and oriented. Pt responded to CSW questions appropriately. No questions or concerns at this time.

## 2012-11-06 NOTE — ED Notes (Signed)
HBG 5.2 told to EDP delo

## 2012-11-06 NOTE — ED Provider Notes (Signed)
History     CSN: [id number]  Arrival date & time 11/06/12  0021   First MD Initiated Contact with Patient 11/06/12 0131      Chief Complaint  Patient presents with  . Hematemesis    (Consider location/radiation/quality/duration/timing/severity/associated sxs/prior treatment) HPI Comments: Patient brought from e[facility] for eval of vomiting blood.  He denies any abd pain, fevers, or chills.  No black or bloody stool.  He does have a history of dementia and adds little additional history due to this.  From review of the chart, he underwent endoscopy in 2011 and was found to have a 20 mm polyp in the antrum of the stomach.  Denies to me a history of GI Bleed.  He is currently on coumadin, the reason for which is unclear to me.  The history is provided by the patient.    Past Medical History  Diagnosis Date  . Thyroid disease   . Anemia   . Hyperlipidemia   . Neuropathy, diabetic   . Diabetic retinopathy   . GERD (gastroesophageal reflux disease)   . Arthritis   . Urgency of urination   . Hypothyroidism   . Sacral wound     hx of  . Cancer   . Diabetic retinopathy   . Peripheral vascular disease   . Diabetes mellitus     sees Dr. Chilton Si [facility])  . CHF (congestive heart failure)   . Hypertension     Past Surgical History  Procedure Laterality Date  . Cataract extraction, bilateral    . Colonoscopy w/ polypectomy    . Esophagogastroduodenoscopy  04/25/2012    Procedure: ESOPHAGOGASTRODUODENOSCOPY (EGD);  Surgeon: Meryl Dare, MD,FACG;  Location: [facility];  Service: Endoscopy;  Laterality: N/A;  . Amputation  05/16/2012    Procedure: AMPUTATION BELOW KNEE;  Surgeon: Nadara Mustard, MD;  Location: [facility] OR;  Service: Orthopedics;  Laterality: Right;  Right Below Knee Amputation  . Tonsillectomy    . Foot amputation      right bka  . Eye surgery      bilateral  . Amputation  09/14/2012    Procedure: AMPUTATION BELOW KNEE;  Surgeon: Nadara Mustard, MD;  Location: [facility] OR;   Service: Orthopedics;  Laterality: Left;  . Amputation Left 10/23/2012    Procedure: Left below knee amputation revision;  Surgeon: Nadara Mustard, MD;  Location: [facility] OR;  Service: Orthopedics;  Laterality: Left;  left below knee amputation revision    Family History  Problem Relation Age of Onset  . Hypertension      History  Substance Use Topics  . Smoking status: Former Smoker -- 0.25 packs/day    Types: Cigarettes    Quit date: 11/22/2006  . Smokeless tobacco: Never Used  . Alcohol Use: 0.0 oz/week    1-2 Cans of beer per week     Comment: drinks with meals      Review of Systems  All other systems reviewed and are negative.    Allergies  Bactrim and Doxycycline  Home Medications   Current Outpatient Rx  Name  Route  Sig  Dispense  Refill  . albuterol (PROVENTIL HFA;VENTOLIN HFA) 108 (90 BASE) MCG/ACT inhaler   Inhalation   Inhale 2 puffs into the lungs every 6 (six) hours as needed for wheezing.         Marland Kitchen aspirin 81 MG chewable tablet   Oral   Chew 1 tablet (81 mg total) by mouth daily.         Marland Kitchen  docusate sodium (COLACE) 100 MG capsule   Oral   Take 100 mg by mouth 2 (two) times daily.         . feeding supplement (PRO-STAT SUGAR FREE 64) LIQD   Oral   Take 30 mLs by mouth 2 (two) times daily.         . ferrous sulfate 325 (65 FE) MG tablet   Oral   Take 325 mg by mouth daily with breakfast.         . insulin glargine (LANTUS) 100 UNIT/ML injection   Subcutaneous   Inject 10 Units into the skin at bedtime.         . insulin NPH-insulin regular (HUMULIN 70/30) (70-30) 100 UNIT/ML injection   Subcutaneous   Inject 20 Units into the skin daily with breakfast.         . ipratropium (ATROVENT) 0.02 % nebulizer solution   Nebulization   Take 0.5 mg by nebulization every 2 (two) hours as needed. For shortness of breath         . levothyroxine (SYNTHROID, LEVOTHROID) 88 MCG tablet   Oral   Take 88 mcg by mouth daily.         .  metoprolol tartrate (LOPRESSOR) 25 MG tablet   Oral   Take 12.5 mg by mouth 2 (two) times daily.         . mirtazapine (REMERON) 15 MG tablet   Oral   Take 7.5 mg by mouth at bedtime.          Marland Kitchen omeprazole (PRILOSEC) 20 MG capsule   Oral   Take 20 mg by mouth 2 (two) times daily.         Marland Kitchen oxyCODONE-acetaminophen (PERCOCET/ROXICET) 5-325 MG per tablet   Oral   Take 1 tablet by mouth every 4 (four) hours as needed for pain.         Marland Kitchen oxyCODONE-acetaminophen (ROXICET) 5-325 MG per tablet   Oral   Take 1 tablet by mouth every 4 (four) hours as needed for pain.   60 tablet   0   . warfarin (COUMADIN) 4 MG tablet   Oral   Take 4 mg by mouth every evening.           BP 141/49  Pulse 77  Temp(Src) 98.2 F (36.8 C) (Oral)  Resp 22  SpO2 97%  Physical Exam  Nursing note and vitals reviewed. Constitutional: He is oriented to person, place, and time. He appears well-developed and well-nourished. No distress.  HENT:  Head: Normocephalic and atraumatic.  Mouth/Throat: Oropharynx is clear and moist.  Neck: Normal range of motion. Neck supple.  Cardiovascular: Normal rate and regular rhythm.   No murmur heard. Pulmonary/Chest: Effort normal and breath sounds normal. No respiratory distress. He has no wheezes.  Abdominal: Soft. Bowel sounds are normal. He exhibits no distension. There is no tenderness.  Musculoskeletal: Normal range of motion. He exhibits no edema.  Lymphadenopathy:    He has no cervical adenopathy.  Neurological: He is alert and oriented to person, place, and time.  Skin: Skin is warm and dry. He is not diaphoretic.    ED Course  Procedures (including critical care time)  Labs Reviewed  CBC WITH DIFFERENTIAL - Abnormal; Notable for the following:    WBC 19.0 (*)    RBC 1.64 (*)    Hemoglobin 5.2 (*)    HCT 15.9 (*)    RDW 20.6 (*)    Neutro Abs 14.0 (*)  Monocytes Absolute 1.1 (*)    Eosinophils Relative 6 (*)    Eosinophils Absolute 1.1  (*)    All other components within normal limits  BASIC METABOLIC PANEL - Abnormal; Notable for the following:    Glucose, Bld 243 (*)    BUN 40 (*)    Calcium 8.3 (*)    GFR calc non Af Amer 78 (*)    All other components within normal limits  APTT - Abnormal; Notable for the following:    aPTT 70 (*)    All other components within normal limits  PROTIME-INR - Abnormal; Notable for the following:    Prothrombin Time 33.3 (*)    INR 3.52 (*)    All other components within normal limits  TYPE AND SCREEN  PREPARE RBC (CROSSMATCH)   No results found.   No diagnosis found.   Date: 11/06/2012  Rate: 84  Rhythm: normal sinus rhythm  QRS Axis: left  Intervals: normal  ST/T Wave abnormalities: nonspecific T wave changes  Conduction Disutrbances:right bundle branch block  Narrative Interpretation:   Old EKG Reviewed: none available    MDM  The patient was brought here with what appears to be a GI bleed with anemia.  His Hb is 5.4 and he is somewhat hypotensinve.  I have ordered transfusion of 2 units of prbcs.  He also has an elevated inr of 3.5 and this will be reversed in consultation with the hospitalist.  He was also given iv protonix.  I have spoken with Dr. Russella Dar from GI who wants the patient admitted to [facility].  Dr. Conley Rolls agrees to admit.     CRITICAL CARE Performed by: Geoffery Lyons   Total critical care time: 30 minutes  Critical care time was exclusive of separately billable procedures and treating other patients.  Critical care was necessary to treat or prevent imminent or life-threatening deterioration.  Critical care was time spent personally by me on the following activities: development of treatment plan with patient and/or surrogate as well as nursing, discussions with consultants, evaluation of patient's response to treatment, examination of patient, obtaining history from patient or surrogate, ordering and performing treatments and interventions, ordering and  review of laboratory studies, ordering and review of radiographic studies, pulse oximetry and re-evaluation of patient's condition.         Geoffery Lyons, MD 11/06/12 [phone]

## 2012-11-06 NOTE — Progress Notes (Signed)
Pt arrived to 3302 from ED.  No complaints at this time.  Vital signs stable.  1st unit PRBC transfusing.  Oriented to unit.    -Maximino Greenland RN

## 2012-11-06 NOTE — H&P (Signed)
[facility] History and Physical  [name] AOZ:[id number] DOB: 1928/12/09    PCP:   [name], MD   Chief Complaint: Hematemesis.  HPI: [name] is an 79 y.o. male with hx of anemia, DM and diabetic complications, s/p recent left BKA, and is on coumadin probably for DVT prophylaxis, hx of gastritis and gastric polyp by EGD 04/2012 by Dr Lamont Snowball, hypothyroidism, presents to the ER with hematemesis of blood and clots.  He was borderline hypotensive with SBP 90's.  He was found to have a Hb of 5.2 grams /DL, with WBC of 96E, BUN of 40, and INR of 3.5.  After GI was consulted by the EDP, hospitalist was asked to admit him for upper GI bleed under the context of hypotension and supratherapeutic on coumadin.   Rewiew of Systems:  Constitutional: Negative for malaise, fever and chills. No significant weight loss or weight gain Eyes: Negative for eye pain, redness and discharge, diplopia, visual changes, or flashes of light. ENMT: Negative for ear pain, hoarseness, nasal congestion, sinus pressure and sore throat. No headaches; tinnitus, drooling, or problem swallowing. Cardiovascular: Negative for chest pain, palpitations, diaphoresis, dyspnea and peripheral edema. ; No orthopnea, PND Respiratory: Negative for cough, hemoptysis, wheezing and stridor. No pleuritic chestpain. Gastrointestinal: Negative for nausea,  diarrhea, constipation, abdominal pain, melena, blood in stool,  jaundice and rectal bleeding.    Genitourinary: Negative for frequency, dysuria, incontinence,flank pain and hematuria; Musculoskeletal: Negative for back pain and neck pain. Negative for swelling and trauma.;  Skin: . Negative for pruritus, rash, abrasions, bruising and skin lesion.; ulcerations Neuro: Negative for headache, lightheadedness and neck stiffness. Negative for weakness, altered level of consciousness , altered mental status, extremity weakness, burning feet, involuntary movement, seizure and  syncope.  Psych: negative for anxiety, depression, insomnia, tearfulness, panic attacks, hallucinations, paranoia, suicidal or homicidal ideation    Past Medical History  Diagnosis Date  . Thyroid disease   . Anemia   . Hyperlipidemia   . Neuropathy, diabetic   . Diabetic retinopathy   . GERD (gastroesophageal reflux disease)   . Arthritis   . Urgency of urination   . Hypothyroidism   . Sacral wound     hx of  . Cancer   . Diabetic retinopathy   . Peripheral vascular disease   . Diabetes mellitus     sees Dr. Chilton Si [facility])  . CHF (congestive heart failure)   . Hypertension     Past Surgical History  Procedure Laterality Date  . Cataract extraction, bilateral    . Colonoscopy w/ polypectomy    . Esophagogastroduodenoscopy  04/25/2012    Procedure: ESOPHAGOGASTRODUODENOSCOPY (EGD);  Surgeon: Meryl Dare, MD,FACG;  Location: [facility];  Service: Endoscopy;  Laterality: N/A;  . Amputation  05/16/2012    Procedure: AMPUTATION BELOW KNEE;  Surgeon: Nadara Mustard, MD;  Location: [facility] OR;  Service: Orthopedics;  Laterality: Right;  Right Below Knee Amputation  . Tonsillectomy    . Foot amputation      right bka  . Eye surgery      bilateral  . Amputation  09/14/2012    Procedure: AMPUTATION BELOW KNEE;  Surgeon: Nadara Mustard, MD;  Location: [facility] OR;  Service: Orthopedics;  Laterality: Left;  . Amputation Left 10/23/2012    Procedure: Left below knee amputation revision;  Surgeon: Nadara Mustard, MD;  Location: [facility] OR;  Service: Orthopedics;  Laterality: Left;  left below knee amputation revision    Medications:  HOME MEDS:  Prior to Admission medications   Medication Sig Start Date End Date Taking? Authorizing Provider  albuterol (PROVENTIL HFA;VENTOLIN HFA) 108 (90 BASE) MCG/ACT inhaler Inhale 2 puffs into the lungs every 6 (six) hours as needed for wheezing.    Historical Provider, MD  aspirin 81 MG chewable tablet Chew 1 tablet (81 mg total) by mouth daily. 04/26/12  04/26/13  Vilinda Blanks Minor, NP  docusate sodium (COLACE) 100 MG capsule Take 100 mg by mouth 2 (two) times daily.    Historical Provider, MD  feeding supplement (PRO-STAT SUGAR FREE 64) LIQD Take 30 mLs by mouth 2 (two) times daily.    Historical Provider, MD  ferrous sulfate 325 (65 FE) MG tablet Take 325 mg by mouth daily with breakfast.    Historical Provider, MD  insulin glargine (LANTUS) 100 UNIT/ML injection Inject 10 Units into the skin at bedtime.    Historical Provider, MD  insulin NPH-insulin regular (HUMULIN 70/30) (70-30) 100 UNIT/ML injection Inject 20 Units into the skin daily with breakfast.    Historical Provider, MD  ipratropium (ATROVENT) 0.02 % nebulizer solution Take 0.5 mg by nebulization every 2 (two) hours as needed. For shortness of breath 04/26/12 04/26/13  Vilinda Blanks Minor, NP  levothyroxine (SYNTHROID, LEVOTHROID) 88 MCG tablet Take 88 mcg by mouth daily.    Historical Provider, MD  metoprolol tartrate (LOPRESSOR) 25 MG tablet Take 12.5 mg by mouth 2 (two) times daily.    Historical Provider, MD  mirtazapine (REMERON) 15 MG tablet Take 7.5 mg by mouth at bedtime.     Historical Provider, MD  omeprazole (PRILOSEC) 20 MG capsule Take 20 mg by mouth 2 (two) times daily.    Historical Provider, MD  oxyCODONE-acetaminophen (PERCOCET/ROXICET) 5-325 MG per tablet Take 1 tablet by mouth every 4 (four) hours as needed for pain. 09/16/12   Nadara Mustard, MD  oxyCODONE-acetaminophen (ROXICET) 5-325 MG per tablet Take 1 tablet by mouth every 4 (four) hours as needed for pain. 10/25/12   Nadara Mustard, MD  warfarin (COUMADIN) 4 MG tablet Take 4 mg by mouth every evening.    Historical Provider, MD     Allergies:  Allergies  Allergen Reactions  . Bactrim (Sulfamethoxazole W-Trimethoprim) Swelling    Lips, eyes, cheeks swollen  . Doxycycline Swelling    Lips, eyes, cheeks were all swollen    Social History:   reports that he quit smoking about 5 years ago. His smoking use included  Cigarettes. He smoked 0.25 packs per day. He has never used smokeless tobacco. He reports that  drinks alcohol. He reports that he does not use illicit drugs.  Family History: Family History  Problem Relation Age of Onset  . Hypertension       Physical Exam: Filed Vitals:   11/06/12 0237 11/06/12 0300 11/06/12 0319 11/06/12 0400  BP: 93/35 92/40 107/45 116/44  Pulse:    80  Temp: 98 F (36.7 C)  97.8 F (36.6 C) 98 F (36.7 C)  TempSrc: Oral  Oral Oral  Resp:  17  14  Height:    6' (1.829 m)  Weight:    59.3 kg (130 lb 11.7 oz)  SpO2:    100%   Blood pressure 116/44, pulse 80, temperature 98 F (36.7 C), temperature source Oral, resp. rate 14, height 6' (1.829 m), weight 59.3 kg (130 lb 11.7 oz), SpO2 100.00%.  GEN:  Pleasant patient lying in the stretcher in no acute distress; cooperative with exam. Pale and weak. PSYCH:  alert and oriented x4; does not appear anxious or depressed; affect is appropriate. HEENT: Mucous membranes pink and anicteric; PERRLA; EOM intact; no cervical lymphadenopathy nor thyromegaly or carotid bruit; no JVD; There were no stridor. Neck is very supple. Breasts:: Not examined CHEST WALL: No tenderness CHEST: Normal respiration, clear to auscultation bilaterally.  HEART: Regular rate and rhythm.  There are no murmur, rub, or gallops.   BACK: No kyphosis or scoliosis; no CVA tenderness ABDOMEN: soft and non-tender; no masses, no organomegaly, normal abdominal bowel sounds; no pannus; no intertriginous candida. There is no rebound and no distention. Rectal Exam: Not done EXTREMITIES: No bone or joint deformity; age-appropriate arthropathy of the hands and knees; no edema; no ulcerations.  There is no calf tenderness. He is status post both BKA. Genitalia: not examined PULSES: 2+ and symmetric SKIN: Normal hydration no rash or ulceration CNS: Cranial nerves 2-12 grossly intact no focal lateralizing neurologic deficit.  Speech is fluent; uvula elevated  with phonation, facial symmetry and tongue midline. DTR are normal bilaterally, cerebella exam is intact, barbinski is negative and strengths are equaled bilaterally.  No sensory loss.   Labs on Admission:  Basic Metabolic Panel:  Recent Labs Lab 11/06/12 0048  NA 135  K 3.8  CL 99  CO2 28  GLUCOSE 243*  BUN 40*  CREATININE 0.89  CALCIUM 8.3*   Liver Function Tests: No results found for this basename: AST, ALT, ALKPHOS, BILITOT, PROT, ALBUMIN,  in the last 168 hours No results found for this basename: LIPASE, AMYLASE,  in the last 168 hours No results found for this basename: AMMONIA,  in the last 168 hours CBC:  Recent Labs Lab 11/06/12 0048  WBC 19.0*  NEUTROABS 14.0*  HGB 5.2*  HCT 15.9*  MCV 97.0  PLT 373   Cardiac Enzymes: No results found for this basename: CKTOTAL, CKMB, CKMBINDEX, TROPONINI,  in the last 168 hours  CBG: No results found for this basename: GLUCAP,  in the last 168 hours   Radiological Exams on Admission: No results found.  Assessment/Plan Present on Admission:  . Hematemesis/vomiting blood . Diabetes mellitus . Anemia . Weakness . Hypothyroidism . Benign neoplasm of stomach  PLAN:  Will admit him to SDU for upper GI bleed.  He will receive 2 units of PRBCs along with IV PPI drip.  I will reverse his coagulopathy with vit K and FFP.  GI has been consulted and will likely scope him this am.  Will d/c iron supplement and made NPO.  For his DM, will use IVF with Dextrose, and use SSI.  He has been on thyroid supplement and I will check TSH. He is stable at this time, full code, and will be admit to the SDU under [facility] service.  Other plans as per orders.  Code Status: FULL Unk Lightning, MD. [facility] Pager [phone] 7pm to 7am.  11/06/2012, 4:20 AM

## 2012-11-06 NOTE — Progress Notes (Signed)
MD notified concerning pt minimal urine output. Pt voided total of for for shift. New order receive to increase fluids to NS. Will continue to monitor.

## 2012-11-07 ENCOUNTER — Encounter (HOSPITAL_COMMUNITY)
Admission: EM | Disposition: A | Discharge status: Skilled Nursing Facility | Payer: Self-pay | Source: Home / Self Care | Attending: Internal Medicine

## 2012-11-07 ENCOUNTER — Encounter (HOSPITAL_COMMUNITY): Payer: Self-pay | Admitting: Gastroenterology

## 2012-11-07 DIAGNOSIS — D62 Acute posthemorrhagic anemia: Secondary | ICD-10-CM

## 2012-11-07 DIAGNOSIS — D131 Benign neoplasm of stomach: Principal | ICD-10-CM

## 2012-11-07 HISTORY — PX: ESOPHAGOGASTRODUODENOSCOPY: SHX5428

## 2012-11-07 LAB — BASIC METABOLIC PANEL
CO2: 28 mEq/L (ref 19–32)
Chloride: 106 mEq/L (ref 96–112)
Creatinine, Ser: 0.7 mg/dL (ref 0.50–1.35)
Glucose, Bld: 239 mg/dL — ABNORMAL HIGH (ref 70–99)

## 2012-11-07 LAB — TYPE AND SCREEN
ABO/RH(D): O POS
Antibody Screen: NEGATIVE
Unit division: 0

## 2012-11-07 LAB — PREPARE FRESH FROZEN PLASMA: Unit division: 0

## 2012-11-07 LAB — GLUCOSE, CAPILLARY: Glucose-Capillary: 226 mg/dL — ABNORMAL HIGH (ref 70–99)

## 2012-11-07 LAB — CBC
HCT: 24 % — ABNORMAL LOW (ref 39.0–52.0)
Hemoglobin: 8.1 g/dL — ABNORMAL LOW (ref 13.0–17.0)
MCH: 30.8 pg (ref 26.0–34.0)
RBC: 2.63 MIL/uL — ABNORMAL LOW (ref 4.22–5.81)

## 2012-11-07 SURGERY — EGD (ESOPHAGOGASTRODUODENOSCOPY)
Anesthesia: Moderate Sedation

## 2012-11-07 MED ORDER — MIDAZOLAM HCL 5 MG/ML IJ SOLN
INTRAMUSCULAR | Status: AC
  Filled 2012-11-07: qty 2

## 2012-11-07 MED ORDER — FENTANYL CITRATE 0.05 MG/ML IJ SOLN
INTRAMUSCULAR | Status: DC | PRN
  Administered 2012-11-07: 25 ug via INTRAVENOUS

## 2012-11-07 MED ORDER — BUTAMBEN-TETRACAINE-BENZOCAINE 2-2-14 % EX AERO
INHALATION_SPRAY | CUTANEOUS | Status: DC | PRN
  Administered 2012-11-07: 2 via TOPICAL

## 2012-11-07 MED ORDER — SODIUM CHLORIDE 0.9 % IV SOLN
INTRAVENOUS | Status: DC
  Administered 2012-11-07: 250 mL via INTRAVENOUS

## 2012-11-07 MED ORDER — FENTANYL CITRATE 0.05 MG/ML IJ SOLN
INTRAMUSCULAR | Status: AC
  Filled 2012-11-07: qty 2

## 2012-11-07 MED ORDER — INSULIN GLARGINE 100 UNIT/ML ~~LOC~~ SOLN
10.0000 [IU] | Freq: Every day | SUBCUTANEOUS | Status: DC
  Administered 2012-11-07: 10 [IU] via SUBCUTANEOUS

## 2012-11-07 MED ORDER — MIDAZOLAM HCL 5 MG/5ML IJ SOLN
INTRAMUSCULAR | Status: DC | PRN
  Administered 2012-11-07: 2 mg via INTRAVENOUS

## 2012-11-07 NOTE — Progress Notes (Signed)
Inpatient Diabetes Program Recommendations  AACE/ADA: New Consensus Statement on Inpatient Glycemic Control (2013)  Target Ranges:  Prepandial:   less than 140 mg/dL      Peak postprandial:   less than 180 mg/dL (1-2 hours)      Critically ill patients:  140 - 180 mg/dL    Results for SABEN, DONIGAN (MRN [id number]) as of 11/07/2012 09:26  Ref. Range 11/06/2012 04:32 11/06/2012 07:51 11/06/2012 12:08 11/06/2012 16:49 11/06/2012 19:29  Glucose-Capillary Latest Range: 70-99 mg/dL 213 (H) 086 (H) 578 (H) 223 (H) 202 (H)   Results for ALBIE, ARIZPE (MRN [id number]) as of 11/07/2012 09:26  Ref. Range 11/06/2012 23:13 11/07/2012 04:04 11/07/2012 07:50  Glucose-Capillary Latest Range: 70-99 mg/dL 413 (H) 244 (H) 010 (H)    MD- Please add patient's home Lantus dose- 10 units QHS.  Note: Will follow. Ambrose Finland RN, MSN, CDE Diabetes Coordinator Inpatient Diabetes Program [phone]

## 2012-11-07 NOTE — Interval H&P Note (Signed)
History and Physical Interval Note:  11/07/2012 11:19 AM  [name]  has presented today for surgery, with the diagnosis of hemetemesis  The various methods of treatment have been discussed with the patient and family. After consideration of risks, benefits and other options for treatment, the patient has consented to  Procedure(s): ESOPHAGOGASTRODUODENOSCOPY (EGD) (N/A) as a surgical intervention .  [name] history has been reviewed, patient examined, no change in status, stable for surgery.  I have reviewed the patient's chart and labs.  Questions were answered to the patient's satisfaction.    The recent H&P (dated *11/06/12**) was reviewed, the patient was examined and there is no change in the patients condition since that H&P was completed.   Melvia Heaps  11/07/2012, 11:19 AM    Melvia Heaps

## 2012-11-07 NOTE — Progress Notes (Signed)
Upper endoscopy demonstrated a bleeding gastric polyp. This was band ligated which should enable it to eventually involute and fall off.  Okay to restart anticoagulation therapy in a couple of days and if the patient. He'll require followup endoscopy in approximately 6 weeks.  Incidentally noted were 2 very small AVMs, one in the esophagus and one in the stomach.

## 2012-11-07 NOTE — H&P (View-Only) (Signed)
[facility] Consult: 10:44 AM 11/06/2012   Referring Provider: Feliz-Ortiz  Primary Care Physician:  HENDERSON, WENDY, MD Primary Gastroenterologist:  Dr. Malcolm Stark  Reason for Consultation:  hemetemesis  HPI: [name] is a 79 y.o. male.  Hx esophageal candidiasis 2013, Hyperplastic colon polyps 2009, 2013, treated H Pylori in 2009.  IDDM.  S/P Bil BKAs.  Started coumadin in Jan 2014 after left BKA.  Transfused with blood then as well, for post op anemia.  IDDM.  Appetite not good but no nausea, early satiety or dysphagia.  No heart burn. Stools dark because he takes po Iron chronically. No abdominal or esophageal pain.  Yesterday evening had 2 episodes of hemetemesis, producing clots of blood.  Volume was maybe 30 cc or so.  Had 2 less bloody episodes this AM. Started on Protonix drip.  AT SNF takes 20 mg Omeprazole, 81 mg ASA as well as Warfarin.   His Hgb was 5.2, getting second of 2 units PRBCs now. INR was 3.5. Got 5 mg IV Vitamin K.   Post AKA office visit with Dr Duda on 11/04/12 where all was deemed well.  Due back there for staple removal 11/11/12.    Past Medical History  Diagnosis Date  . Thyroid disease   . Anemia   . Hyperlipidemia   . Neuropathy, diabetic   . Diabetic retinopathy   . GERD (gastroesophageal reflux disease)   . Arthritis   . Urgency of urination   . Hypothyroidism   . Sacral wound     hx of  . Cancer   . Diabetic retinopathy   . Peripheral vascular disease   . Diabetes mellitus     sees Dr. Green ([facility])  . CHF (congestive heart failure)   . Hypertension   . Prostate cancer     Past Surgical History  Procedure Laterality Date  . Cataract extraction, bilateral    . Colonoscopy w/ polypectomy    . Esophagogastroduodenoscopy  04/25/2012    Procedure: ESOPHAGOGASTRODUODENOSCOPY (EGD);  Surgeon: Malcolm T Stark, MD,FACG;  Location: [facility];  Service: Endoscopy;  Laterality: N/A;   . Amputation  05/16/2012    Procedure: AMPUTATION BELOW KNEE;  Surgeon: Marcus V Duda, MD;  Location: [facility] OR;  Service: Orthopedics;  Laterality: Right;  Right Below Knee Amputation  . Tonsillectomy    . Foot amputation      right bka  . Eye surgery      bilateral  . Amputation  09/14/2012    Procedure: AMPUTATION BELOW KNEE;  Surgeon: Marcus V Duda, MD;  Location: [facility] OR;  Service: Orthopedics;  Laterality: Left;  . Amputation Left 10/23/2012    Procedure: Left below knee amputation revision;  Surgeon: Marcus V Duda, MD;  Location: [facility] OR;  Service: Orthopedics;  Laterality: Left;  left below knee amputation revision    Prior to Admission medications   Medication Sig Start Date End Date Taking? Authorizing Provider  albuterol (PROVENTIL HFA;VENTOLIN HFA) 108 (90 BASE) MCG/ACT inhaler Inhale 2 puffs into the lungs every 6 (six) hours as needed for wheezing.    Historical Provider, MD  aspirin 81 MG chewable tablet Chew 1 tablet (81 mg total) by mouth daily. 04/26/12 04/26/13  William S Minor, NP  docusate sodium (COLACE) 100 MG capsule Take 100 mg by mouth 2 (two) times daily.    Historical Provider, MD  feeding supplement (PRO-STAT SUGAR FREE 64) LIQD Take 30 mLs by mouth 2 (two) times daily.    Historical Provider, MD    ferrous sulfate 325 (65 FE) MG tablet Take 325 mg by mouth daily with breakfast.    Historical Provider, MD  insulin glargine (LANTUS) 100 UNIT/ML injection Inject 10 Units into the skin at bedtime.    Historical Provider, MD  insulin NPH-insulin regular (HUMULIN 70/30) (70-30) 100 UNIT/ML injection Inject 20 Units into the skin daily with breakfast.    Historical Provider, MD  ipratropium (ATROVENT) 0.02 % nebulizer solution Take 0.5 mg by nebulization every 2 (two) hours as needed. For shortness of breath 04/26/12 04/26/13  William S Minor, NP  levothyroxine (SYNTHROID, LEVOTHROID) 88 MCG tablet Take 88 mcg by mouth daily.    Historical Provider, MD  metoprolol tartrate (LOPRESSOR)  25 MG tablet Take 12.5 mg by mouth 2 (two) times daily.    Historical Provider, MD  mirtazapine (REMERON) 15 MG tablet Take 7.5 mg by mouth at bedtime.     Historical Provider, MD  omeprazole (PRILOSEC) 20 MG capsule Take 20 mg by mouth 2 (two) times daily.    Historical Provider, MD  oxyCODONE-acetaminophen (PERCOCET/ROXICET) 5-325 MG per tablet Take 1 tablet by mouth every 4 (four) hours as needed for pain. 09/16/12   Marcus V Duda, MD  oxyCODONE-acetaminophen (ROXICET) 5-325 MG per tablet Take 1 tablet by mouth every 4 (four) hours as needed for pain. 10/25/12   Marcus V Duda, MD  warfarin (COUMADIN) 4 MG tablet Take 4 mg by mouth every evening.    Historical Provider, MD    Scheduled Meds: . sodium chloride   Intravenous STAT  . Chlorhexidine Gluconate Cloth  6 each Topical Q0600  . insulin aspart  0-9 Units Subcutaneous Q4H  . levothyroxine  88 mcg Oral QAC breakfast  . mirtazapine  7.5 mg Oral QHS  . mupirocin ointment  1 application Nasal BID  . sodium chloride  3 mL Intravenous Q12H   Infusions: . dextrose 5 % and 0.9% NaCl 75 mL/hr at 11/06/12 0436  . pantoprozole (PROTONIX) infusion 8 mg/hr (11/06/12 0501)   PRN Meds: albuterol, ondansetron (ZOFRAN) IV, ondansetron   Allergies as of 11/06/2012 - Review Complete 11/06/2012  Allergen Reaction Noted  . Bactrim (sulfamethoxazole w-trimethoprim) Swelling 05/26/2012  . Doxycycline Swelling 05/26/2012    Family History  Problem Relation Age of Onset  . Hypertension      History   Social History  . Marital Status: Married    Spouse Name: N/A    Number of Children: N/A  . Years of Education: N/A   Occupational History  . 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 Smoking status: Former Smoker -- 0.25 packs/day    Types: Cigarettes    Quit date: 11/22/2006  . Smokeless tobacco: Never Used  . Alcohol Use: 0.0 oz/week    1-2 Cans of beer per week     Comment: drinks with meals  . Drug Use: No  .  Sexually Active: No     REVIEW OF SYSTEMS: Constitutional:  No diminished or increased abd girth ENT:  No nose bleeds Pulm:  No SOB or cough CV:  No chest pain or palps GU:  Nocturia 2 x per noc.  No incmplete voiding or blood in urine. GI:  Per HPI Heme:  Taking iron for years Transfusions:  Transfusions on various occasions in past..    Neuro:  No headache or seizures Derm:  Slight non-bloody oozing from left AKA site, not purulent or bloody Endocrine:  No excessive thirst, no sweats Immunization:    Flu shot current Travel:  none   PHYSICAL EXAM: Vital signs in last 24 hours: Temp:  [97.8 F (36.6 C)-98.3 F (36.8 C)] 98.3 F (36.8 C) (03/05 0730) Pulse Rate:  [72-82] 82 (03/05 0730) Resp:  [10-22] 10 (03/05 0730) BP: (92-141)/(35-55) 106/46 mmHg (03/05 0730) SpO2:  [95 %-100 %] 100 % (03/05 0400) Weight:  [59.3 kg (130 lb 11.7 oz)] 59.3 kg (130 lb 11.7 oz) (03/05 0400)  General: looks well Head:  No trauma or swelling  Eyes:  No icterus or pallor Ears:  Not HOH  Nose:  No bleeding Mouth:  Moist, clear, pink.  One tooth remains Neck:  No JVD, no mass Lungs:  Clear.  No labored breathing Heart: RRR Abdomen:  Soft, NT, ND.  No mass or HSM.  There is mild distention Rectal: not done   Musc/Skeltl: BKA B Extremities:  No UE edema  Neurologic:  Oriented x 3.  No tremor moves all 4 limbs Skin:  No rash, no angioma Tattoos:  none Nodes:  No inguinal adenopathy   Psych:  Pleasant cooperative.   Intake/Output from previous day: 03/04 0701 - 03/05 0700 In: 1292.1 [I.V.:479.6; Blood:812.5] Out: 175 [Urine:75; Emesis/NG output:100] Intake/Output this shift: Total I/O In: 150 [Blood:150] Out: -   LAB RESULTS:  Recent Labs  11/06/12 0048  WBC 19.0*  HGB 5.2*  HCT 15.9*  PLT 373   BMET Lab Results  Component Value Date   NA 135 11/06/2012   NA 135 10/24/2012   NA 138 10/23/2012   K 3.8 11/06/2012   K 4.4 10/24/2012   K 3.8 10/23/2012   CL 99 11/06/2012   CL 100  10/24/2012   CL 100 10/23/2012   CO2 28 11/06/2012   CO2 26 10/24/2012   CO2 30 10/23/2012   GLUCOSE 243* 11/06/2012   GLUCOSE 435* 10/24/2012   GLUCOSE 241* 10/23/2012   BUN 40* 11/06/2012   BUN 23 10/24/2012   BUN 25* 10/23/2012   CREATININE 0.89 11/06/2012   CREATININE 0.81 10/24/2012   CREATININE 0.87 10/23/2012   CALCIUM 8.3* 11/06/2012   CALCIUM 8.0* 10/24/2012   CALCIUM 8.8 10/23/2012   LFT No results found for this basename: PROT, ALBUMIN, AST, ALT, ALKPHOS, BILITOT, BILIDIR, IBILI,  in the last 72 hours PT/INR Lab Results  Component Value Date   INR 3.52* 11/06/2012   INR 2.24* 10/25/2012   INR 1.77* 10/24/2012      RADIOLOGY STUDIES: No results found.  ENDOSCOPIC STUDIES: 04/2012 EGD  MT Stark for dysphagia at [facility] 1. White esophageal exudates consistent with candidiasis; multiple biopsies  2. Pedunculated polyp measuring 1.5 cm in size in the gastric antrum; multiple biopsies  3. Atrophic gastritis  4. Duodenitis Pathology: 1. Stomach, biopsy - CHRONIC INFLAMMATION WITH GLANDULAR ATROPHY AND INTESTINAL METAPLASIA ASSOCIATED WITH FOCAL EROSION. NO HELICOBACTER PYLORI, DYSPLASIA OR MALIGNANCY. 2. Esophagus, biopsy - BENIGN ESOPHAGEAL SQUAMOUS MUCOSA WITH FUNGAL FORMS CONSISTENT WITH CANDIDA. NO INTESTINAL METAPLASIA, DYSPLASIA OR MALIGNANCY.  07/2008  EGD AVMs, gastric hyperplastic polyp  STOMACH, ANTRAL, POLYPS, BIOPSY: - HYPERPLASTIC POLYP WITH FOCAL LOW GRADE DYSPLASIA AND EXTENSIVE INTESTINAL METAPLASIA. NO EVIDENCE OF HIGH GRADE DYSPLASIA OR MALIGNANCY IDENTIFIED. - ROD-SHAPED BACILLI IN THE ADJACENT GASTRIC ANTRAL MUCOSA, CONSISTENT WITH HELICOBACTER PYLORI GASTRITIS. PLEASE SEE COMMENT. H Pylori treated with Prevpac 2009   IMPRESSION: *  Hematemesis.  Rule out ulcer or AVM or MW tear.  *  Hx esophageal candidiasis 2013 *  Hx gastric polyps 2009, 04/2012. Hyperplastic pathology.  *    IDDM.  Looks to be poorly controlled, no recent Hgb A1c *  S/P left BKA 09/14/12 for  osteomyelitis.  Revision of amputation due to abcess on 10/23/12.   Right BKA in 05/2012.  *  Coumadin, started 09/2012 post op.  INR supratherapeutic at 3.5.  5mg vit *  ABL anemia.  Post op anemia as well.  S/p 2 PRBCs in Feb 2014.  On po Iron at home *  Peripheral artery disease.  *  Prostate cancer, labeled as Gleason 6 in 2009 note. Does not seem to be an active problem   PLAN: *  EGD when coags normalize.   I see that FFP is available to transfuse.  I went ahead and reordered CBC an coags for now. Continue the PPI drip for now, along with NPO   LOS: 0 days   Sarah Gribbin  11/06/2012, 10:44 AM Pager: [phone]  Chart was reviewed and patient was examined. X-rays and lab were reviewed.    I agree with management and plans.  He should has had limited upper GI bleeding exacerbated by coagulopathy. Etiology for nausea and vomiting is uncertain. He has mild abdominal distention but is without tenderness.  Plan as above. We'll check abdominal series as well.  Robert D. Kaplan, M.D., FACG [facility] Cell [phone]       

## 2012-11-07 NOTE — Progress Notes (Signed)
Pt has transferred to 5500. CSW notified unit CSW who will begin following and assist with dc back to [facility] when pt is medically ready. This CSW signing off.  Theresia Bough, MSW, Theresia Majors [phone]

## 2012-11-07 NOTE — Care Management Note (Signed)
    Page 1 of 1   11/08/2012     2:44:02 PM   CARE MANAGEMENT NOTE 11/08/2012  Patient:  [name],[name]   Account Number:  [id number]  Date Initiated:  11/07/2012  Documentation initiated by:  Letha Cape  Subjective/Objective Assessment:   dx hematemesis  admit- from [facility] SNF     Action/Plan:   Anticipated DC Date:  11/08/2012   Anticipated DC Plan:  SKILLED NURSING FACILITY  In-house referral  Clinical Social Worker      DC Planning Services  CM consult      Choice offered to / List presented to:             Status of service:  Completed, signed off Medicare Important Message given?   (If response is "NO", the following Medicare IM given date fields will be blank) Date Medicare IM given:   Date Additional Medicare IM given:    Discharge Disposition:  SKILLED NURSING FACILITY  Per UR Regulation:  Reviewed for med. necessity/level of care/duration of stay  If discussed at Long Length of Stay Meetings, dates discussed:    Comments:  11/08/12 14:38 Letha Cape RN, BSN [phone] patient is for discharge back to [place], CSW following.  11/07/12 15:03 Letha Cape RN, BSN [phone] patient is from [facility] referral.

## 2012-11-07 NOTE — Progress Notes (Signed)
     [facility] Daily Rounding Note 11/07/2012, 9:53 AM  SUBJECTIVE:       Feels well.  No vomiting.  Really wants some water.  No BMs  OBJECTIVE:         Vital signs in last 24 hours:    Temp:  [97.4 F (36.3 C)-98.7 F (37.1 C)] 98.7 F (37.1 C) (03/06 0753) Pulse Rate:  [64-73] 68 (03/06 0753) Resp:  [9-25] 13 (03/06 0753) BP: (95-108)/(39-49) 108/48 mmHg (03/06 0753) SpO2:  [95 %-100 %] 95 % (03/06 0753)   General: looks well, NAD   Heart:     Did not reexamine pt   Only spoke with him in room  Chest:  Abdomen:   Extremities:  Neuro/Psych:  Not confused.   Intake/Output from previous day: 03/05 0701 - 03/06 0700 In: 4060 [I.V.:2835; Blood:1225] Out: 776 [Urine:775; Stool:1]  Intake/Output this shift: Total I/O In: 125 [I.V.:125] Out: 0   Lab Results:  Recent Labs  11/06/12 0048 11/06/12 1754 11/07/12 0448  WBC 19.0* 17.7* 14.0*  HGB 5.2* 6.4* 8.1*  HCT 15.9* 19.2* 24.0*  PLT 373 273 307   BMET  Recent Labs  11/06/12 0048 11/07/12 0448  NA 135 139  K 3.8 3.8  CL 99 106  CO2 28 28  GLUCOSE 243* 239*  BUN 40* 26*  CREATININE 0.89 0.70  CALCIUM 8.3* 7.9*   LFT No results found for this basename: PROT, ALBUMIN, AST, ALT, ALKPHOS, BILITOT, BILIDIR, IBILI,  in the last 72 hours PT/INR  Recent Labs  11/06/12 0059 11/06/12 1754  LABPROT 33.3* 15.2  INR 3.52* 1.22     Studies/Results: Dg Abd 2 Views 11/06/2012  IMPRESSION:  1.  Unremarkable bowel gas pattern; no free intra-abdominal air seen.  Moderate to large amount of stool noted in the colon, without significant constipation. 2.  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   Original Report Authenticated By: Tonia Ghent, M.D.     ASSESMENT: * Hematemesis. Rule out ulcer or AVM or MW tear. bleeding has stopped.  * Hx esophageal candidiasis 2013  * Hx gastric polyps 2009, 04/2012. Hyperplastic pathology.  * IDDM. Looks to be poorly controlled, no recent Hgb A1c  * S/P left BKA 09/14/12 for  osteomyelitis. Revision of amputation due to abcess on 10/23/12.  Right BKA in 05/2012.  * Coumadin,on  Hold.  S/p Vit K with now normal INR * ABL anemia. Post op anemia as well. S/p 2 PRBCs in Feb 2014. On po Iron at home  * Peripheral artery disease.  * Prostate cancer, labeled as Gleason 6 in 2009 note. Does not seem to be an active problem   PLAN: *  EGD 10:30 today.  He is NPO   LOS: 1 day   Jennye Moccasin  11/07/2012, 9:53 AM Pager: [phone]

## 2012-11-07 NOTE — Progress Notes (Signed)
[facility] PROGRESS NOTE Interim History: 79 y.o. male with hx of anemia, DM and diabetic complications, s/p recent left BKA, and is on coumadin probably for DVT prophylaxis, hx of gastritis and gastric polyp by EGD 04/2012 by Dr Lamont Snowball, hypothyroidism, presents to the ER with hematemesis of blood and clots. He was borderline hypotensive with SBP 90's. He was found to have a Hb of 5.2 grams /DL, admitted to SDU. Was given FFP and vit K. EGD 3.5.2014.   Assessment/Plan:  Acute blood loss anemia/  *Hematemesis/vomiting blood - S/p 3 units transfusion 3.5.2014. - VS stable. NPO EGD possibly 3.6.2014. - INR 1.2 (see below). GI on board.  Supratherapeutic INR - Vit K and FFP. - recheck INR   Diabetes mellitus - On SSI, NPO. - BG high.   Weakness - 2/2 to #1.    Hypothyroidism: - cont synthroid.    Code Status: full Family Communication: none  Disposition Plan: SDU   Consultants:  GI  Procedures:  EGD 3.5.2014  Antibiotics:  none (indicate start date, and stop date if known)  HPI/Subjective: Vomited this am. Still feels weak. Patient currently NPO.   Objective: Filed Vitals:   11/06/12 2314 11/07/12 0014 11/07/12 0106 11/07/12 0345  BP: 108/40 106/44 101/45 106/49  Pulse: 65 64 64 72  Temp: 98.1 F (36.7 C) 97.7 F (36.5 C) 97.4 F (36.3 C) 98.6 F (37 C)  TempSrc: Oral Oral Oral Oral  Resp: 11 10 10 14   Height:      Weight:      SpO2: 99%   100%    Intake/Output Summary (Last 24 hours) at 11/07/12 0751 Last data filed at 11/07/12 0600  Gross per 24 hour  Intake   3785 ml  Output    776 ml  Net   3009 ml   Filed Weights   11/06/12 0400  Weight: 59.3 kg (130 lb 11.7 oz)    Exam:  General: Alert, awake, oriented x3, in no acute distress cachectic appearing HEENT: No bruits, no goiter.  Heart: Regular rate and rhythm, without murmurs, rubs, gallops.  Lungs: Good air movement, clear to auscultation. Abdomen: Soft, nontender,  nondistended, positive bowel sounds.  Ext:  B/l below the knee amputation.   Data Reviewed: Basic Metabolic Panel:  Recent Labs Lab 11/06/12 0048 11/07/12 0448  NA 135 139  K 3.8 3.8  CL 99 106  CO2 28 28  GLUCOSE 243* 239*  BUN 40* 26*  CREATININE 0.89 0.70  CALCIUM 8.3* 7.9*   Liver Function Tests: No results found for this basename: AST, ALT, ALKPHOS, BILITOT, PROT, ALBUMIN,  in the last 168 hours No results found for this basename: LIPASE, AMYLASE,  in the last 168 hours No results found for this basename: AMMONIA,  in the last 168 hours CBC:  Recent Labs Lab 11/06/12 0048 11/06/12 1754 11/07/12 0448  WBC 19.0* 17.7* 14.0*  NEUTROABS 14.0*  --   --   HGB 5.2* 6.4* 8.1*  HCT 15.9* 19.2* 24.0*  MCV 97.0 91.0 91.3  PLT 373 273 307   Cardiac Enzymes: No results found for this basename: CKTOTAL, CKMB, CKMBINDEX, TROPONINI,  in the last 168 hours BNP (last 3 results)  Recent Labs  04/11/12 0420 04/16/12 1020  PROBNP 3477.0* 2493.0*   CBG:  Recent Labs Lab 11/06/12 0432 11/06/12 0751 11/06/12 1208 11/06/12 1649 11/06/12 1929  GLUCAP 263* 280* 229* 223* 202*    Recent Results (from the past 240 hour(s))  MRSA PCR SCREENING  Status: Abnormal   Collection Time    11/06/12  4:43 AM      Result Value Range Status   MRSA by PCR POSITIVE (*) NEGATIVE Final   Comment:            The GeneXpert MRSA Assay (FDA     approved for NASAL specimens     only), is one component of a     comprehensive MRSA colonization     surveillance program. It is not     intended to diagnose MRSA     infection nor to guide or     monitor treatment for     MRSA infections.     RESULT CALLED TO, READ BACK BY AND VERIFIED WITH:     CONNER,S RN [id number] AT 0622 SKEEN,P     Studies: Dg Abd 2 Views  11/06/2012  *RADIOLOGY REPORT*  Clinical Data: Nausea and vomiting.  ABDOMEN - 2 VIEW  Comparison: Abdominal radiograph performed 05/05/2012  Findings: The visualized bowel gas  pattern is unremarkable. Scattered air and stool filled loops of colon are seen; no abnormal dilatation of small bowel loops is seen to suggest small bowel obstruction.  No free intra-abdominal air is identified on the provided decubitus view.  Mild degenerative change is noted at the mid lumbar spine; degenerative joint space narrowing is also noted at the left hip, with associated sclerotic change.  The visualized lung bases are essentially clear.  IMPRESSION:  1.  Unremarkable bowel gas pattern; no free intra-abdominal air seen.  Moderate to large amount of stool noted in the colon, without significant constipation. 2.  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   Original Report Authenticated By: Tonia Ghent, M.D.     Scheduled Meds: . Chlorhexidine Gluconate Cloth  6 each Topical [id number]  . insulin aspart  0-9 Units Subcutaneous Q4H  . levothyroxine  88 mcg Oral QAC breakfast  . mirtazapine  7.5 mg Oral QHS  . mupirocin ointment  1 application Nasal BID  . sodium chloride  3 mL Intravenous Q12H   Continuous Infusions: . sodium chloride 100 mL/hr at 11/07/12 0409  . dextrose 5 % and 0.9% NaCl Stopped (11/07/12 0410)  . pantoprozole (PROTONIX) infusion 8 mg/hr (11/07/12 0600)     Marinda Elk  [facility] Pager [phone]. If 8PM-8AM, please contact night-coverage at [email or web address], password [facility] 11/07/2012, 7:51 AM  LOS: 1 day

## 2012-11-07 NOTE — Op Note (Addendum)
[name] [address] [place], [address]   ENDOSCOPY PROCEDURE REPORT  PATIENT: [name], [name]  MR#: [id number] BIRTHDATE: December 24, 1928 , 83  yrs. old GENDER: Male ENDOSCOPIST: Louis Meckel, MD REFERRED BY: PROCEDURE DATE:  11/07/2012 PROCEDURE:  EGD w/ biopsy , EGD w/ control of bleeding , and EGD w/ band ligation of gastric polyp ASA CLASS:     Class II INDICATIONS:  Hematemesis. MEDICATIONS: Versed 2 mg IV and Fentanyl 25 mcg IV TOPICAL ANESTHETIC: Cetacaine Spray  DESCRIPTION OF PROCEDURE: After the risks benefits and alternatives of the procedure were thoroughly explained, informed consent was obtained.  The Pentax Gastroscope [id number] endoscope was introduced through the mouth and advanced to the third portion of the duodenum. Without limitations.  The instrument was slowly withdrawn as the mucosa was fully examined.      In the gastric antrum there was a semi-pedunculated, 2.5 centimeter polyp with a minimal amount of fresh blood area there is a second nonbleeding 3 mm polyp. In the gastric body and in the distal esophagus there were 21 mm deeply reddened areas suggestive of AVMs.  Remainder of theexamination was normal including the duodenum proximal stomach and proximal esophagus.   The scope was withdrawn and a band ligator was attached to the end of the scope.  The bleeding gastric polyp was ligated with 3 band ligators.  Scope was then withdrawn.         The scope was then withdrawn from the patient and the procedure completed.  COMPLICATIONS: There were no complications. ENDOSCOPIC IMPRESSION: Bleeding gastric polyp-status post band ligation Possible single gastric and esophageal AVM  RECOMMENDATIONS: 1.  Continue PPI therapy 2.  followup endoscopy in approximately 6 weeks REPEAT EXAM:  eSigned:  Louis Meckel, MD 11/07/2012 12:04 PM   CC: [name], MD  PATIENT NAME:  [name], [name] MR#: [id number]

## 2012-11-08 DIAGNOSIS — K922 Gastrointestinal hemorrhage, unspecified: Secondary | ICD-10-CM

## 2012-11-08 DIAGNOSIS — D62 Acute posthemorrhagic anemia: Secondary | ICD-10-CM

## 2012-11-08 LAB — GLUCOSE, CAPILLARY
Glucose-Capillary: 78 mg/dL (ref 70–99)
Glucose-Capillary: 177 mg/dL — ABNORMAL HIGH (ref 70–99)

## 2012-11-08 MED ORDER — PANTOPRAZOLE SODIUM 40 MG PO TBEC: 40.0000 mg | DELAYED_RELEASE_TABLET | Freq: Every day | ORAL | Status: DC

## 2012-11-08 NOTE — Discharge Summary (Signed)
Physician Discharge Summary  [name] NWG:[id number] DOB: [birth year] DOA: 11/06/2012  PCP: [name], MD  Admit date: 11/06/2012 Discharge date: 11/08/2012  Time spent: 30 minutes  Recommendations for Outpatient Follow-up:  1. Follow up with Dr. Anselm Jungling  Discharge Diagnoses:  Principal Problem:   Hematemesis/vomiting blood Active Problems:   Acute blood loss anemia   Weakness   Diabetes mellitus   Hypothyroidism   Anemia   Benign neoplasm of stomach   Supratherapeutic INR   Protein-calorie malnutrition, moderate   Discharge Condition: stable  Diet recommendation: heart healthy diet  Filed Weights   11/06/12 0400  Weight: 59.3 kg (130 lb 11.7 oz)    History of present illness:  79 y.o. male with hx of anemia, DM and diabetic complications, s/p recent left BKA, and is on coumadin probably for DVT prophylaxis, hx of gastritis and gastric polyp by EGD 04/2012 by Dr Lamont Snowball, hypothyroidism, presents to the ER with hematemesis of blood and clots. He was borderline hypotensive with SBP 90's. He was found to have a Hb of 5.2 grams /DL, with WBC of 57Q, BUN of 40, and INR of 3.5. After GI was consulted by the EDP, hospitalist was asked to admit him for upper GI bleed under the context of hypotension and supratherapeutic on coumadin.   Hospital Course:  Acute blood loss anemia/ *Hematemesis/vomiting blood : - 2 large IV and Volume resuscitated. - S/p 3 units transfusion 3.5.2014.  - VS stable. NPO EGD possibly 3.6.2014: Bleeding gastric polyp-status post band ligation  Possible single gastric and esophageal AVM - Reverse INR 1.2 (see below).  Supratherapeutic INR  - Vit K and FFP.  - recheck INR.  Diabetes mellitus  - On SSI, NPO.  - BG high.   Weakness  - 2/2 to #1.   Hypothyroidism:  - cont synthroid.   Procedures: EGD possibly 3.6.2014: Bleeding gastric polyp-status post band ligation   Possible single gastric and esophageal AVM (i.e. Studies not  automatically included, echos, thoracentesis, etc; not x-rays)  Consultations:  Dr. Arlyce Dice GI.  Discharge Exam: Filed Vitals:   11/07/12 1230 11/07/12 1300 11/07/12 2153 11/08/12 0551  BP: 104/46 106/53 121/53 150/84  Pulse:  64 65 63  Temp:  98.3 F (36.8 C) 97.2 F (36.2 C) 97.8 F (36.6 C)  TempSrc:  Oral Oral Oral  Resp: 20 20 20 20   Height:      Weight:      SpO2: 95% 95% 98% 98%    General: A&O x3 Cardiovascular: RRR Respiratory: Good air movement, clear to auscultation.  Discharge Instructions  Discharge Orders   Future Appointments [name] Department Dept Phone   12/16/2012 10:45 AM Meryl Dare, MD [facility] [phone]   Future Orders Complete By Expires     Diet - low sodium heart healthy  As directed     Increase activity slowly  As directed         Medication List    STOP taking these medications       warfarin 4 MG tablet  Commonly known as:  COUMADIN      TAKE these medications       albuterol 108 (90 BASE) MCG/ACT inhaler  Commonly known as:  PROVENTIL HFA;VENTOLIN HFA  Inhale 2 puffs into the lungs every 6 (six) hours as needed for wheezing.     aspirin 81 MG chewable tablet  Chew 1 tablet (81 mg total) by mouth daily.     docusate sodium 100 MG capsule  Commonly known as:  COLACE  Take 100 mg by mouth 2 (two) times daily.     feeding supplement Liqd  Take 30 mLs by mouth 2 (two) times daily.     ferrous sulfate 325 (65 FE) MG tablet  Take 325 mg by mouth daily with breakfast.     HUMULIN 70/30 (70-30) 100 UNIT/ML injection  Generic drug:  insulin NPH-insulin regular  Inject 20 Units into the skin daily with breakfast.     insulin glargine 100 UNIT/ML injection  Commonly known as:  LANTUS  Inject 10 Units into the skin at bedtime.     ipratropium 0.02 % nebulizer solution  Commonly known as:  ATROVENT  Take 500 mcg by nebulization every 2 (two) hours as needed (shortness of breath).      levothyroxine 88 MCG tablet  Commonly known as:  SYNTHROID, LEVOTHROID  Take 88 mcg by mouth daily.     linezolid 600 MG tablet  Commonly known as:  ZYVOX  Take 600 mg by mouth every 12 (twelve) hours. Starting 11/05/12 for 10 days     metoprolol tartrate 25 MG tablet  Commonly known as:  LOPRESSOR  Take 12.5 mg by mouth 2 (two) times daily.     mirtazapine 15 MG tablet  Commonly known as:  REMERON  Take 7.5 mg by mouth at bedtime.     omeprazole 20 MG capsule  Commonly known as:  PRILOSEC  Take 20 mg by mouth 2 (two) times daily.     oxyCODONE-acetaminophen 5-325 MG per tablet  Commonly known as:  ROXICET  Take 1 tablet by mouth every 4 (four) hours as needed for pain.     pantoprazole 40 MG tablet  Commonly known as:  PROTONIX  Take 1 tablet (40 mg total) by mouth daily at 6 (six) AM.           Follow-up Information   Follow up with [name] Petit T. Russella Dar, MD On 12/16/2012. (10:45 AM to follow up gi bleed)    Contact information:   [address] [place] [address] [phone]        The results of significant diagnostics from this hospitalization (including imaging, microbiology, ancillary and laboratory) are listed below for reference.    Significant Diagnostic Studies: Dg Abd 2 Views  11/06/2012  *RADIOLOGY REPORT*  Clinical Data: Nausea and vomiting.  ABDOMEN - 2 VIEW  Comparison: Abdominal radiograph performed 05/05/2012  Findings: The visualized bowel gas pattern is unremarkable. Scattered air and stool filled loops of colon are seen; no abnormal dilatation of small bowel loops is seen to suggest small bowel obstruction.  No free intra-abdominal air is identified on the provided decubitus view.  Mild degenerative change is noted at the mid lumbar spine; degenerative joint space narrowing is also noted at the left hip, with associated sclerotic change.  The visualized lung bases are essentially clear.  IMPRESSION:  1.  Unremarkable bowel gas pattern; no free  intra-abdominal air seen.  Moderate to large amount of stool noted in the colon, without significant constipation. 2.  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   Original Report Authenticated By: Tonia Ghent, M.D.     Microbiology: Recent Results (from the past 240 hour(s))  MRSA PCR SCREENING     Status: Abnormal   Collection Time    11/06/12  4:43 AM      Result Value Range Status   MRSA by PCR POSITIVE (*) NEGATIVE Final   Comment:  The GeneXpert MRSA Assay (FDA     approved for NASAL specimens     only), is one component of a     comprehensive MRSA colonization     surveillance program. It is not     intended to diagnose MRSA     infection nor to guide or     monitor treatment for     MRSA infections.     RESULT CALLED TO, READ BACK BY AND VERIFIED WITH:     CONNER,S RN [id number] AT 0622 SKEEN,P     Labs: Basic Metabolic Panel:  Recent Labs Lab 11/06/12 0048 11/07/12 0448  NA 135 139  K 3.8 3.8  CL 99 106  CO2 28 28  GLUCOSE 243* 239*  BUN 40* 26*  CREATININE 0.89 0.70  CALCIUM 8.3* 7.9*   Liver Function Tests: No results found for this basename: AST, ALT, ALKPHOS, BILITOT, PROT, ALBUMIN,  in the last 168 hours No results found for this basename: LIPASE, AMYLASE,  in the last 168 hours No results found for this basename: AMMONIA,  in the last 168 hours CBC:  Recent Labs Lab 11/06/12 0048 11/06/12 1754 11/07/12 0448  WBC 19.0* 17.7* 14.0*  NEUTROABS 14.0*  --   --   HGB 5.2* 6.4* 8.1*  HCT 15.9* 19.2* 24.0*  MCV 97.0 91.0 91.3  PLT 373 273 307   Cardiac Enzymes: No results found for this basename: CKTOTAL, CKMB, CKMBINDEX, TROPONINI,  in the last 168 hours BNP: BNP (last 3 results)  Recent Labs  04/11/12 0420 04/16/12 1020  PROBNP 3477.0* 2493.0*   CBG:  Recent Labs Lab 11/07/12 1619 11/07/12 2026 11/08/12 0028 11/08/12 0359 11/08/12 0754  GLUCAP 213* 303* 200* 104* 78     Signed:  Marinda Elk  [facility] 11/08/2012, 10:56 AM

## 2012-11-08 NOTE — Clinical Social Work Note (Signed)
CSW was consulted to complete discharge of patient. Pt to transfer to [facility] today via [facility]. [facility] and family are aware of d/c. D/C packet complete with chart copy, signed FL2, and signed hard Rx.  CSW signing off as no other CSW needs identified at this time.  Lia Foyer, LCSWA [facility] Clinical Social Worker Contact #: [phone]

## 2012-11-08 NOTE — Progress Notes (Signed)
NURSING PROGRESS NOTE  Adolfo Granieri [id number] Discharge Data: 11/08/2012 1:34 PM Attending Provider: Marinda Elk, MD DGU:[id number], [facility], MD     [name] to be D/C'd Skilled nursing facility per MD order.   All IV's discontinued with no bleeding noted. All belongings returned to patient for patient to take [place]. Report called to [facility]. All questions answered for the nurse.   Last Vital Signs:  Blood pressure 150/84, pulse 63, temperature 97.8 F (36.6 C), temperature source Oral, resp. rate 20, height 6' (1.829 m), weight 59.3 kg (130 lb 11.7 oz), SpO2 98.00%.  Discharge Medication List   Medication List    STOP taking these medications       warfarin 4 MG tablet  Commonly known as:  COUMADIN      TAKE these medications       albuterol 108 (90 BASE) MCG/ACT inhaler  Commonly known as:  PROVENTIL HFA;VENTOLIN HFA  Inhale 2 puffs into the lungs every 6 (six) hours as needed for wheezing.     aspirin 81 MG chewable tablet  Chew 1 tablet (81 mg total) by mouth daily.     docusate sodium 100 MG capsule  Commonly known as:  COLACE  Take 100 mg by mouth 2 (two) times daily.     feeding supplement Liqd  Take 30 mLs by mouth 2 (two) times daily.     ferrous sulfate 325 (65 FE) MG tablet  Take 325 mg by mouth daily with breakfast.     HUMULIN 70/30 (70-30) 100 UNIT/ML injection  Generic drug:  insulin NPH-insulin regular  Inject 20 Units into the skin daily with breakfast.     insulin glargine 100 UNIT/ML injection  Commonly known as:  LANTUS  Inject 10 Units into the skin at bedtime.     ipratropium 0.02 % nebulizer solution  Commonly known as:  ATROVENT  Take 500 mcg by nebulization every 2 (two) hours as needed (shortness of breath).     levothyroxine 88 MCG tablet  Commonly known as:  SYNTHROID, LEVOTHROID  Take 88 mcg by mouth daily.     linezolid 600 MG tablet  Commonly known as:  ZYVOX  Take 600 mg by mouth every 12 (twelve) hours.  Starting 11/05/12 for 10 days     metoprolol tartrate 25 MG tablet  Commonly known as:  LOPRESSOR  Take 12.5 mg by mouth 2 (two) times daily.     mirtazapine 15 MG tablet  Commonly known as:  REMERON  Take 7.5 mg by mouth at bedtime.     omeprazole 20 MG capsule  Commonly known as:  PRILOSEC  Take 20 mg by mouth 2 (two) times daily.     oxyCODONE-acetaminophen 5-325 MG per tablet  Commonly known as:  ROXICET  Take 1 tablet by mouth every 4 (four) hours as needed for pain.     pantoprazole 40 MG tablet  Commonly known as:  PROTONIX  Take 1 tablet (40 mg total) by mouth daily at 6 (six) AM.        Harlon Flor, Elmarie Mainland, RN

## 2012-11-08 NOTE — Progress Notes (Signed)
     [facility] Daily Rounding Note 11/08/2012, 10:03 AM  SUBJECTIVE:       No abd pain, no bloody stool.  Tolerating regular diet.  Phantom pain in stump.  OBJECTIVE:         Vital signs in last 24 hours:    Temp:  [97.2 F (36.2 C)-98.4 F (36.9 C)] 97.8 F (36.6 C) (03/07 0551) Pulse Rate:  [63-66] 63 (03/07 0551) Resp:  [10-21] 20 (03/07 0551) BP: (98-150)/(46-84) 150/84 mmHg (03/07 0551) SpO2:  [95 %-100 %] 98 % (03/07 0551) Last BM Date: 11/08/12 General: looks well   Heart: RRR Chest: clear B.  No resp distress Abdomen: soft, ND, NT.  Active BS  Extremities: BKA B Neuro/Psych:  Pleasant, oriented x 3.  Good spirits.   Intake/Output from previous day: 03/06 0701 - 03/07 0700 In: 1125 [I.V.:1125] Out: 950 [Urine:950]  Intake/Output this shift:    Lab Results:  Recent Labs  11/06/12 0048 11/06/12 1754 11/07/12 0448  WBC 19.0* 17.7* 14.0*  HGB 5.2* 6.4* 8.1*  HCT 15.9* 19.2* 24.0*  PLT 373 273 307   BMET  Recent Labs  11/06/12 0048 11/07/12 0448  NA 135 139  K 3.8 3.8  CL 99 106  CO2 28 28  GLUCOSE 243* 239*  BUN 40* 26*  CREATININE 0.89 0.70  CALCIUM 8.3* 7.9*   LFT No results found for this basename: PROT, ALBUMIN, AST, ALT, ALKPHOS, BILITOT, BILIDIR, IBILI,  in the last 72 hours PT/INR  Recent Labs  11/06/12 0059 11/06/12 1754  LABPROT 33.3* 15.2  INR 3.52* 1.22   Hepatitis Panel No results found for this basename: HEPBSAG, HCVAB, HEPAIGM, HEPBIGM,  in the last 72 hours  Studies/Results: Dg Abd 2 Views 11/06/2012    IMPRESSION:  1.  Unremarkable bowel gas pattern; no free intra-abdominal air seen.  Moderate to large amount of stool noted in the colon, without significant constipation. 2.  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   Original Report Authenticated By: Tonia Ghent, M.D.     ASSESMENT: * Hematemesis. EGD 3/6:  Bleeding gastric polyp that was band ligated.  ? Gastric and esophageal AVM:  These were not intervened upon.  * Hx  esophageal candidiasis 2013  * Hx gastric polyps 2009, 04/2012. Hyperplastic pathology.  * IDDM. Looks to be poorly controlled, no recent Hgb A1c  * S/P left BKA 09/14/12 for osteomyelitis. Revision of amputation due to abcess on 10/23/12.  Right BKA in 05/2012.  * Coumadin,on Hold. S/p Vit K with now normal INR  * ABL anemia. Post op anemia as well. S/p 2 PRBCs in Feb 2014. On po Iron at home  * Peripheral artery disease.  * Prostate cancer, labeled as Gleason 6 in 2009 note. Does not seem to be an active problem    PLAN: *  Has ROV with Dr Russella Dar on 12/16/12.  GI will sign off. Ok to restart Coumadin if needed.  Daily PPI   LOS: 2 days   [name]  11/08/2012, 10:03 AM Pager: [phone]  I have personally taken an interval history, reviewed the chart, and examined the patient.  I agree with the extender's note, impression and recommendations.  Barbette Hair. Arlyce Dice, MD, [facility] [phone]

## 2012-11-11 ENCOUNTER — Encounter (HOSPITAL_COMMUNITY): Payer: Self-pay | Admitting: Gastroenterology

## 2012-11-29 ENCOUNTER — Non-Acute Institutional Stay (SKILLED_NURSING_FACILITY): Payer: Medicare Other | Admitting: Internal Medicine

## 2012-11-29 DIAGNOSIS — D62 Acute posthemorrhagic anemia: Secondary | ICD-10-CM

## 2012-11-29 DIAGNOSIS — R5381 Other malaise: Secondary | ICD-10-CM

## 2012-11-29 DIAGNOSIS — K920 Hematemesis: Secondary | ICD-10-CM

## 2012-11-29 DIAGNOSIS — L97509 Non-pressure chronic ulcer of other part of unspecified foot with unspecified severity: Secondary | ICD-10-CM | POA: Diagnosis not present

## 2012-11-29 DIAGNOSIS — D131 Benign neoplasm of stomach: Secondary | ICD-10-CM

## 2012-11-29 DIAGNOSIS — E11621 Type 2 diabetes mellitus with foot ulcer: Secondary | ICD-10-CM

## 2012-11-29 DIAGNOSIS — E1169 Type 2 diabetes mellitus with other specified complication: Secondary | ICD-10-CM | POA: Diagnosis not present

## 2012-11-29 NOTE — Progress Notes (Signed)
Date: 11/15/2012 MRN:  [id number] Name:  [name] Sex:  male Age:  79 y.o. DOB:[birth year]   [employer] #:     [id number]                  Facility/Room; [facility] Level Of Care:SNF Provider: Dr. Murray Hodgkins  Emergency Contacts: Contact Information   Name Relation Home Work [place] Spouse [phone]  [phone]   [facility] Daughter   [phone]   Darcey, Demma   [phone]   Levert,James  [phone]     Simmons,Sheneka  [phone]     Hill,Matt Nephew   [phone]      Code Status: MOST Form:  Allergies: Allergies  Allergen Reactions  . Bactrim (Sulfamethoxazole W-Trimethoprim) Swelling    Lips, eyes, cheeks swollen  . Doxycycline Swelling    Lips, eyes, cheeks were all swollen     No chief complaint on file.  Admitted to [facility] 11/06/12 to 11/08/12   HPI:79 y.o. male with hx of anemia, DM and diabetic complications, s/p recent left BKA, and is on coumadin probably for DVT prophylaxis, hx of gastritis and gastric polyp by EGD 04/2012 by Dr Lamont Snowball, hypothyroidism, presents to the ER with hematemesis of blood and clots. He was borderline hypotensive with SBP 90's. He was found to have a Hb of 5.2 grams /DL, with WBC of 07P, BUN of 40, and INR of 3.5. After GI was consulted by the EDP, hospitalist was asked to admit him for upper GI bleed under the context of hypotension and supratherapeutic on coumadin.  Acute blood loss anemia/ *Hematemesis/vomiting blood : - 2 large IV and Volume resuscitated. - S/p 3 units transfusion 3.5.2014.     Past Medical History  Diagnosis Date  . Thyroid disease   . Anemia   . Hyperlipidemia   . Neuropathy, diabetic   . Diabetic retinopathy   . GERD (gastroesophageal reflux disease)   . Arthritis   . Urgency of urination   . Hypothyroidism   . Sacral wound     hx of  . Cancer   . Diabetic retinopathy   . Peripheral vascular disease   . Diabetes mellitus     sees Dr. Chilton Si [facility])  .  CHF (congestive heart failure)   . Hypertension   . Prostate cancer    Hematemesis/vomiting blood Acute blood loss anemia Weakness Benign neoplasm of stomach Protein-calorie malnutrition, moderate  Past Surgical History  Procedure Laterality Date  . Cataract extraction, bilateral    . Colonoscopy w/ polypectomy    . Esophagogastroduodenoscopy  04/25/2012    Procedure: ESOPHAGOGASTRODUODENOSCOPY (EGD);  Surgeon: Meryl Dare, MD,FACG;  Location: [facility];  Service: Endoscopy;  Laterality: N/A;  . Amputation  05/16/2012    Procedure: AMPUTATION BELOW KNEE;  Surgeon: Nadara Mustard, MD;  Location: [facility] OR;  Service: Orthopedics;  Laterality: Right;  Right Below Knee Amputation  . Tonsillectomy    . Foot amputation      right bka  . Eye surgery      bilateral  . Amputation  09/14/2012    Procedure: AMPUTATION BELOW KNEE;  Surgeon: Nadara Mustard, MD;  Location: [facility] OR;  Service: Orthopedics;  Laterality: Left;  . Amputation Left 10/23/2012    Procedure: Left below knee amputation revision;  Surgeon: Nadara Mustard, MD;  Location: [facility] OR;  Service: Orthopedics;  Laterality: Left;  left below knee amputation revision  . Esophagogastroduodenoscopy N/A 11/07/2012    Procedure: ESOPHAGOGASTRODUODENOSCOPY (EGD);  Surgeon: Molly Maduro  Rosalio Macadamia, MD;  Location: [facility] ENDOSCOPY;  Service: Endoscopy;  Laterality: N/A;     Procedures: EGD possibly 3.6.2014: Bleeding gastric polyp-status post band ligation    Possible single gastric and esophageal AVM (i.e. Studies not automatically included, echos, thoracentesis, etc; not x-rays) Dg Abd 2 Views  11/06/2012  *RADIOLOGY REPORT*  Clinical Data: Nausea and vomiting.  ABDOMEN - 2 VIEW  Comparison: Abdominal radiograph performed 05/05/2012  Findings: The visualized bowel gas pattern is unremarkable. Scattered air and stool filled loops of colon are seen; no abnormal dilatation of small bowel loops is seen to suggest small bowel obstruction.  No free intra-abdominal air  is identified on the provided decubitus view.  Mild degenerative change is noted at the mid lumbar spine; degenerative joint space narrowing is also noted at the left hip, with associated sclerotic change.  The visualized lung bases are essentially clear.  IMPRESSION:  1.  Unremarkable bowel gas pattern; no free intra-abdominal air seen.  Moderate to large amount of stool noted in the colon, without significant constipation. 2.  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   Original Report Authenticated By: Tonia Ghent, M.D.     Consultants: PCP: [name], MD  Dr. Arlyce Dice GI.    Current Outpatient Prescriptions  Medication Sig Dispense Refill  . albuterol (PROVENTIL HFA;VENTOLIN HFA) 108 (90 BASE) MCG/ACT inhaler Inhale 2 puffs into the lungs every 6 (six) hours as needed for wheezing.      [place] Kitchen aspirin 81 MG chewable tablet Chew 1 tablet (81 mg total) by mouth daily.      Mar[place] Kitchen docusate sodium (COLACE) 100 MG capsule Take 100 mg by mouth 2 (two) times daily.      . feeding supplement (PRO-STAT SUGAR FREE 64) LIQD Take 30 mLs by mouth 2 (two) times daily.      . ferrous sulfate 325 (65 FE) MG tablet Take 325 mg by mouth daily with breakfast.      . insulin glargine (LANTUS) 100 UNIT/ML injection Inject 10 Units into the skin at bedtime.      . insulin NPH-insulin regular (HUMULIN 70/30) (70-30) 100 UNIT/ML injection Inject 20 Units into the skin daily with breakfast.      . ipratropium (ATROVENT) 0.02 % nebulizer solution Take 500 mcg by nebulization every 2 (two) hours as needed (shortness of breath).      Marland Kitchen levothyroxine (SYNTHROID, LEVOTHROID) 88 MCG tablet Take 88 mcg by mouth daily.      Marland Kitchen linezolid (ZYVOX) 600 MG tablet Take 600 mg by mouth every 12 (twelve) hours. Starting 11/05/12 for 10 days      . metoprolol tartrate (LOPRESSOR) 25 MG tablet Take 12.5 mg by mouth 2 (two) times daily.      . mirtazapine (REMERON) 15 MG tablet Take 7.5 mg by mouth at bedtime.       Marland Kitchen omeprazole (PRILOSEC)  20 MG capsule Take 20 mg by mouth 2 (two) times daily.      Marland Kitchen oxyCODONE-acetaminophen (ROXICET) 5-325 MG per tablet Take 1 tablet by mouth every 4 (four) hours as needed for pain.  60 tablet  0  . pantoprazole (PROTONIX) 40 MG tablet Take 1 tablet (40 mg total) by mouth daily at 6 (six) AM.  30 tablet  0   No current facility-administered medications for this visit.     There is no immunization history on file for this patient.   Diet:  History  Substance Use Topics  . Smoking status: Former Smoker -- 0.25 packs/day  Types: Cigarettes    Quit date: 11/22/2006  . Smokeless tobacco: Never Used  . Alcohol Use: 0.0 oz/week    1-2 Cans of beer per week     Comment: drinks with meals    Family History  Problem Relation Age of Onset  . Hypertension         Vital signs: There were no vitals taken for this visit.   Screening Score  MMS    PHQ2    PHQ9     Fall Risk    BIMS    Annual summary: Hospitalizations:  Problem List as of 11/29/2012     ICD-9-CM   Uncontrolled diabetes mellitus   Weakness   Hypertension   Diabetes mellitus   Prostate cancer   Hypothyroidism   Hypokalemia   Anemia   Cellulitis of lower leg   Ulcer of leg due to secondary diabetes mellitus   Diabetes with ulcer of foot   Sacral decubitus ulcer   Non-ST elevation myocardial infarction (NSTEMI), initial episode of care   Pulmonary infiltrates   Dysphagia   Physical deconditioning   Systolic dysfunction   Hypernatremia   Benign neoplasm of stomach   Sepsis   Atherosclerosis of native arteries of the extremities with ulceration(440.23)   Last Assessment & Plan   08/21/2012 Office Visit Written 08/21/2012  1:07 PM by Chuck Hint, MD     This patient has extensive wounds involving the left heel, the lateral aspect of the left foot, and the left great toe. He has undergone an arteriogram which shows that there were no options for revascularization which would significantly impacted  distal circulation. Likewise there was no disease amenable to angioplasty. I have recommended that we proceed with a left below the knee amputation. I think he has a good chance of healing a below-the-knee amputation with a probable 10% chance of nonhealing. He will discuss this with his family and then called to schedule his surgery.    Hematemesis/vomiting blood   Supratherapeutic INR   Acute blood loss anemia   Protein-calorie malnutrition, moderate     Infection History:  Functional assessment: Areas of potential improvement: Rehabilitation Potential: Prognosis for survival: Plan:

## 2012-12-06 ENCOUNTER — Other Ambulatory Visit: Payer: Self-pay | Admitting: Gastroenterology

## 2012-12-06 ENCOUNTER — Telehealth: Payer: Self-pay

## 2012-12-06 DIAGNOSIS — D649 Anemia, unspecified: Secondary | ICD-10-CM

## 2012-12-06 DIAGNOSIS — T879 Unspecified complications of amputation stump: Secondary | ICD-10-CM

## 2012-12-06 DIAGNOSIS — R1319 Other dysphagia: Secondary | ICD-10-CM

## 2012-12-06 DIAGNOSIS — E1159 Type 2 diabetes mellitus with other circulatory complications: Secondary | ICD-10-CM

## 2012-12-06 NOTE — Telephone Encounter (Signed)
Pt scheduled for EGD with dil 12/19/12@ [facility]. Pt to arrive at 11:30am for a 12:30pm appt. Prep instructions faxed to Sam RN at [facility]. Sam aware of appt date and time.

## 2012-12-06 NOTE — Telephone Encounter (Signed)
Message copied by Chrystie Nose on Fri Dec 06, 2012  9:37 AM ------      Message from: Melvia Heaps D      Created: Tue Dec 03, 2012  2:02 PM      Regarding: RE: EGD       At the hospital      ----- Message -----         From: Lily Lovings, RN         Sent: 12/03/2012   8:11 AM           To: Louis Meckel, MD      Subject: FW: EGD                                                  Dr. Arlyce Dice,            Does this EGD need to be at [facility] or can it be scheduled in the [facility]?            Thanks,      Bonita Quin            ----- Message -----         From: Lily Lovings, RN         Sent: 12/03/2012           To: Lily Lovings, RN      Subject: EGD                                                      Pt needs repeat EGD around April 17th             ------

## 2012-12-10 ENCOUNTER — Encounter (HOSPITAL_COMMUNITY): Payer: Self-pay | Admitting: Emergency Medicine

## 2012-12-10 ENCOUNTER — Inpatient Hospital Stay (HOSPITAL_COMMUNITY)
Admission: EM | Admit: 2012-12-10 | Discharge: 2012-12-13 | DRG: 812 | Disposition: A | Discharge status: Skilled Nursing Facility | Payer: Medicare Other | Attending: Internal Medicine | Admitting: Internal Medicine

## 2012-12-10 ENCOUNTER — Inpatient Hospital Stay (HOSPITAL_COMMUNITY): Payer: Medicare Other

## 2012-12-10 DIAGNOSIS — IMO0002 Reserved for concepts with insufficient information to code with codable children: Secondary | ICD-10-CM

## 2012-12-10 DIAGNOSIS — K922 Gastrointestinal hemorrhage, unspecified: Secondary | ICD-10-CM | POA: Diagnosis present

## 2012-12-10 DIAGNOSIS — E785 Hyperlipidemia, unspecified: Secondary | ICD-10-CM | POA: Diagnosis present

## 2012-12-10 DIAGNOSIS — E1149 Type 2 diabetes mellitus with other diabetic neurological complication: Secondary | ICD-10-CM | POA: Diagnosis present

## 2012-12-10 DIAGNOSIS — E039 Hypothyroidism, unspecified: Secondary | ICD-10-CM

## 2012-12-10 DIAGNOSIS — I2510 Atherosclerotic heart disease of native coronary artery without angina pectoris: Secondary | ICD-10-CM | POA: Diagnosis present

## 2012-12-10 DIAGNOSIS — S88119A Complete traumatic amputation at level between knee and ankle, unspecified lower leg, initial encounter: Secondary | ICD-10-CM

## 2012-12-10 DIAGNOSIS — K920 Hematemesis: Secondary | ICD-10-CM

## 2012-12-10 DIAGNOSIS — E11621 Type 2 diabetes mellitus with foot ulcer: Secondary | ICD-10-CM

## 2012-12-10 DIAGNOSIS — E871 Hypo-osmolality and hyponatremia: Secondary | ICD-10-CM | POA: Diagnosis present

## 2012-12-10 DIAGNOSIS — Z9089 Acquired absence of other organs: Secondary | ICD-10-CM

## 2012-12-10 DIAGNOSIS — I509 Heart failure, unspecified: Secondary | ICD-10-CM | POA: Diagnosis present

## 2012-12-10 DIAGNOSIS — M129 Arthropathy, unspecified: Secondary | ICD-10-CM | POA: Diagnosis present

## 2012-12-10 DIAGNOSIS — E876 Hypokalemia: Secondary | ICD-10-CM

## 2012-12-10 DIAGNOSIS — E1142 Type 2 diabetes mellitus with diabetic polyneuropathy: Secondary | ICD-10-CM | POA: Diagnosis present

## 2012-12-10 DIAGNOSIS — I70209 Unspecified atherosclerosis of native arteries of extremities, unspecified extremity: Secondary | ICD-10-CM | POA: Diagnosis present

## 2012-12-10 DIAGNOSIS — Z794 Long term (current) use of insulin: Secondary | ICD-10-CM

## 2012-12-10 DIAGNOSIS — I252 Old myocardial infarction: Secondary | ICD-10-CM

## 2012-12-10 DIAGNOSIS — I10 Essential (primary) hypertension: Secondary | ICD-10-CM

## 2012-12-10 DIAGNOSIS — A419 Sepsis, unspecified organism: Secondary | ICD-10-CM

## 2012-12-10 DIAGNOSIS — D62 Acute posthemorrhagic anemia: Principal | ICD-10-CM

## 2012-12-10 DIAGNOSIS — E861 Hypovolemia: Secondary | ICD-10-CM | POA: Diagnosis present

## 2012-12-10 DIAGNOSIS — Z7982 Long term (current) use of aspirin: Secondary | ICD-10-CM

## 2012-12-10 DIAGNOSIS — L98499 Non-pressure chronic ulcer of skin of other sites with unspecified severity: Secondary | ICD-10-CM

## 2012-12-10 DIAGNOSIS — Z681 Body mass index (BMI) 19 or less, adult: Secondary | ICD-10-CM

## 2012-12-10 DIAGNOSIS — E1139 Type 2 diabetes mellitus with other diabetic ophthalmic complication: Secondary | ICD-10-CM | POA: Diagnosis present

## 2012-12-10 DIAGNOSIS — I5022 Chronic systolic (congestive) heart failure: Secondary | ICD-10-CM | POA: Diagnosis present

## 2012-12-10 DIAGNOSIS — E13622 Other specified diabetes mellitus with other skin ulcer: Secondary | ICD-10-CM

## 2012-12-10 DIAGNOSIS — I519 Heart disease, unspecified: Secondary | ICD-10-CM

## 2012-12-10 DIAGNOSIS — K219 Gastro-esophageal reflux disease without esophagitis: Secondary | ICD-10-CM | POA: Diagnosis present

## 2012-12-10 DIAGNOSIS — I214 Non-ST elevation (NSTEMI) myocardial infarction: Secondary | ICD-10-CM

## 2012-12-10 DIAGNOSIS — E870 Hyperosmolality and hypernatremia: Secondary | ICD-10-CM

## 2012-12-10 DIAGNOSIS — C61 Malignant neoplasm of prostate: Secondary | ICD-10-CM

## 2012-12-10 DIAGNOSIS — R791 Abnormal coagulation profile: Secondary | ICD-10-CM

## 2012-12-10 DIAGNOSIS — R1310 Dysphagia, unspecified: Secondary | ICD-10-CM

## 2012-12-10 DIAGNOSIS — N390 Urinary tract infection, site not specified: Secondary | ICD-10-CM | POA: Diagnosis present

## 2012-12-10 DIAGNOSIS — I739 Peripheral vascular disease, unspecified: Secondary | ICD-10-CM

## 2012-12-10 DIAGNOSIS — Z8546 Personal history of malignant neoplasm of prostate: Secondary | ICD-10-CM

## 2012-12-10 DIAGNOSIS — Z7901 Long term (current) use of anticoagulants: Secondary | ICD-10-CM

## 2012-12-10 DIAGNOSIS — Z87891 Personal history of nicotine dependence: Secondary | ICD-10-CM

## 2012-12-10 DIAGNOSIS — E11319 Type 2 diabetes mellitus with unspecified diabetic retinopathy without macular edema: Secondary | ICD-10-CM | POA: Diagnosis present

## 2012-12-10 DIAGNOSIS — R799 Abnormal finding of blood chemistry, unspecified: Secondary | ICD-10-CM | POA: Diagnosis present

## 2012-12-10 DIAGNOSIS — D649 Anemia, unspecified: Secondary | ICD-10-CM

## 2012-12-10 DIAGNOSIS — K317 Polyp of stomach and duodenum: Secondary | ICD-10-CM

## 2012-12-10 DIAGNOSIS — E162 Hypoglycemia, unspecified: Secondary | ICD-10-CM

## 2012-12-10 DIAGNOSIS — K31819 Angiodysplasia of stomach and duodenum without bleeding: Secondary | ICD-10-CM

## 2012-12-10 DIAGNOSIS — E440 Moderate protein-calorie malnutrition: Secondary | ICD-10-CM

## 2012-12-10 DIAGNOSIS — Z881 Allergy status to other antibiotic agents status: Secondary | ICD-10-CM

## 2012-12-10 DIAGNOSIS — E119 Type 2 diabetes mellitus without complications: Secondary | ICD-10-CM

## 2012-12-10 DIAGNOSIS — E1169 Type 2 diabetes mellitus with other specified complication: Secondary | ICD-10-CM | POA: Diagnosis present

## 2012-12-10 DIAGNOSIS — R531 Weakness: Secondary | ICD-10-CM

## 2012-12-10 DIAGNOSIS — D131 Benign neoplasm of stomach: Secondary | ICD-10-CM

## 2012-12-10 DIAGNOSIS — E11649 Type 2 diabetes mellitus with hypoglycemia without coma: Secondary | ICD-10-CM

## 2012-12-10 DIAGNOSIS — L03119 Cellulitis of unspecified part of limb: Secondary | ICD-10-CM

## 2012-12-10 DIAGNOSIS — R918 Other nonspecific abnormal finding of lung field: Secondary | ICD-10-CM

## 2012-12-10 DIAGNOSIS — B3781 Candidal esophagitis: Secondary | ICD-10-CM | POA: Diagnosis present

## 2012-12-10 DIAGNOSIS — E1165 Type 2 diabetes mellitus with hyperglycemia: Secondary | ICD-10-CM

## 2012-12-10 DIAGNOSIS — Z66 Do not resuscitate: Secondary | ICD-10-CM | POA: Diagnosis present

## 2012-12-10 DIAGNOSIS — R5381 Other malaise: Secondary | ICD-10-CM

## 2012-12-10 DIAGNOSIS — Z79899 Other long term (current) drug therapy: Secondary | ICD-10-CM

## 2012-12-10 DIAGNOSIS — L97909 Non-pressure chronic ulcer of unspecified part of unspecified lower leg with unspecified severity: Secondary | ICD-10-CM

## 2012-12-10 HISTORY — DX: Polyp of stomach and duodenum: K31.7

## 2012-12-10 HISTORY — DX: Acute respiratory failure, unspecified whether with hypoxia or hypercapnia: J96.00

## 2012-12-10 HISTORY — DX: Non-ST elevation (NSTEMI) myocardial infarction: I21.4

## 2012-12-10 HISTORY — DX: Chronic systolic (congestive) heart failure: I50.22

## 2012-12-10 HISTORY — DX: Angiodysplasia of stomach and duodenum without bleeding: K31.819

## 2012-12-10 LAB — CBC WITH DIFFERENTIAL/PLATELET
Basophils Absolute: 0 10*3/uL (ref 0.0–0.1)
HCT: 18.7 % — ABNORMAL LOW (ref 39.0–52.0)
Lymphocytes Relative: 16 % (ref 12–46)
Monocytes Absolute: 1.3 10*3/uL — ABNORMAL HIGH (ref 0.1–1.0)
Neutro Abs: 14.7 10*3/uL — ABNORMAL HIGH (ref 1.7–7.7)
Neutrophils Relative %: 74 % (ref 43–77)
Platelets: 638 10*3/uL — ABNORMAL HIGH (ref 150–400)
RDW: 18.5 % — ABNORMAL HIGH (ref 11.5–15.5)
WBC: 19.8 10*3/uL — ABNORMAL HIGH (ref 4.0–10.5)

## 2012-12-10 LAB — GLUCOSE, CAPILLARY
Glucose-Capillary: 104 mg/dL — ABNORMAL HIGH (ref 70–99)
Glucose-Capillary: 62 mg/dL — ABNORMAL LOW (ref 70–99)
Glucose-Capillary: 65 mg/dL — ABNORMAL LOW (ref 70–99)
Glucose-Capillary: 67 mg/dL — ABNORMAL LOW (ref 70–99)
Glucose-Capillary: 89 mg/dL (ref 70–99)
Glucose-Capillary: 92 mg/dL (ref 70–99)

## 2012-12-10 LAB — PROTIME-INR
INR: 1.22 (ref 0.00–1.49)
Prothrombin Time: 15.2 seconds (ref 11.6–15.2)

## 2012-12-10 LAB — BASIC METABOLIC PANEL
CO2: 32 mEq/L (ref 19–32)
Chloride: 94 mEq/L — ABNORMAL LOW (ref 96–112)
Potassium: 3.9 mEq/L (ref 3.5–5.1)
Sodium: 132 mEq/L — ABNORMAL LOW (ref 135–145)

## 2012-12-10 LAB — URINALYSIS, ROUTINE W REFLEX MICROSCOPIC
Bilirubin Urine: NEGATIVE
Ketones, ur: NEGATIVE mg/dL
Nitrite: POSITIVE — AB
Specific Gravity, Urine: 1.009 (ref 1.005–1.030)
Urobilinogen, UA: 1 mg/dL (ref 0.0–1.0)
pH: 7.5 (ref 5.0–8.0)

## 2012-12-10 LAB — APTT: aPTT: 46 seconds — ABNORMAL HIGH (ref 24–37)

## 2012-12-10 MED ORDER — MORPHINE SULFATE 2 MG/ML IJ SOLN
2.0000 mg | INTRAMUSCULAR | Status: DC | PRN
  Administered 2012-12-10: 2 mg via INTRAVENOUS
  Filled 2012-12-10: qty 1

## 2012-12-10 MED ORDER — MIRTAZAPINE 7.5 MG PO TABS: 7.5000 mg | ORAL_TABLET | ORAL | Status: DC

## 2012-12-10 MED ORDER — KCL IN DEXTROSE-NACL 20-5-0.9 MEQ/L-%-% IV SOLN
INTRAVENOUS | Status: DC
  Administered 2012-12-11 – 2012-12-13 (×4): via INTRAVENOUS
  Filled 2012-12-10 (×8): qty 1000

## 2012-12-10 MED ORDER — DEXTROSE 50 % IV SOLN
INTRAVENOUS | Status: AC
  Administered 2012-12-10: 50 mL
  Filled 2012-12-10: qty 50

## 2012-12-10 MED ORDER — FUROSEMIDE 10 MG/ML IJ SOLN
INTRAMUSCULAR | Status: AC
  Administered 2012-12-10: 40 mg
  Filled 2012-12-10: qty 4

## 2012-12-10 MED ORDER — METOCLOPRAMIDE HCL 5 MG PO TABS
5.0000 mg | ORAL_TABLET | Freq: Three times a day (TID) | ORAL | Status: DC
  Administered 2012-12-11 (×3): 5 mg via ORAL
  Filled 2012-12-10 (×7): qty 1

## 2012-12-10 MED ORDER — MIRTAZAPINE 7.5 MG PO TABS
22.5000 mg | ORAL_TABLET | Freq: Every day | ORAL | Status: DC
  Administered 2012-12-10 – 2012-12-12 (×3): 22.5 mg via ORAL
  Filled 2012-12-10 (×5): qty 1

## 2012-12-10 MED ORDER — KCL IN DEXTROSE-NACL 20-5-0.9 MEQ/L-%-% IV SOLN
INTRAVENOUS | Status: DC
  Administered 2012-12-10: 19:00:00 via INTRAVENOUS
  Filled 2012-12-10 (×2): qty 1000

## 2012-12-10 MED ORDER — ACETAMINOPHEN 325 MG PO TABS: 650.0000 mg | ORAL_TABLET | Freq: Four times a day (QID) | ORAL | Status: DC | PRN

## 2012-12-10 MED ORDER — LEVOTHYROXINE SODIUM 88 MCG PO TABS
88.0000 ug | ORAL_TABLET | Freq: Every day | ORAL | Status: DC
  Administered 2012-12-11: 88 ug via ORAL
  Filled 2012-12-10 (×4): qty 1

## 2012-12-10 MED ORDER — DEXTROSE 50 % IV SOLN
1.0000 | Freq: Once | INTRAVENOUS | Status: AC
  Administered 2012-12-10: 50 mL via INTRAVENOUS
  Filled 2012-12-10: qty 50

## 2012-12-10 MED ORDER — PANTOPRAZOLE SODIUM 40 MG IV SOLR
40.0000 mg | Freq: Two times a day (BID) | INTRAVENOUS | Status: DC
  Administered 2012-12-10: 40 mg via INTRAVENOUS
  Filled 2012-12-10 (×3): qty 40

## 2012-12-10 MED ORDER — FUROSEMIDE 10 MG/ML IJ SOLN
20.0000 mg | Freq: Once | INTRAMUSCULAR | Status: AC
  Administered 2012-12-10: 20 mg via INTRAVENOUS

## 2012-12-10 MED ORDER — MIRTAZAPINE 15 MG PO TABS
15.0000 mg | ORAL_TABLET | Freq: Every day | ORAL | Status: DC
Start: 2012-12-10 — End: 2012-12-10
  Filled 2012-12-10: qty 1

## 2012-12-10 MED ORDER — ACETAMINOPHEN 650 MG RE SUPP: 650.0000 mg | Freq: Four times a day (QID) | RECTAL | Status: DC | PRN

## 2012-12-10 MED ORDER — DEXTROSE 5 % IV SOLN: Freq: Once | INTRAVENOUS | Status: DC

## 2012-12-10 MED ORDER — DEXTROSE 50 % IV SOLN
25.0000 mL | Freq: Once | INTRAVENOUS | Status: AC | PRN
  Administered 2012-12-10: 25 mL via INTRAVENOUS

## 2012-12-10 MED ORDER — ALBUTEROL SULFATE (5 MG/ML) 0.5% IN NEBU: 2.5000 mg | INHALATION_SOLUTION | RESPIRATORY_TRACT | Status: DC | PRN

## 2012-12-10 MED ORDER — PRO-STAT SUGAR FREE PO LIQD
30.0000 mL | Freq: Two times a day (BID) | ORAL | Status: DC
  Administered 2012-12-10 – 2012-12-12 (×3): 30 mL via ORAL
  Filled 2012-12-10 (×7): qty 30

## 2012-12-10 NOTE — ED Notes (Signed)
Pt from [facility] with c/o low hemoglobin. Pt denies dizziness and lightheadedness. Pt denies numbness and tingling. Pt denies n/v/d. Pt denies pain. Pt alert and mentating appropriately.

## 2012-12-10 NOTE — ED Provider Notes (Signed)
History     CSN: [id number]  Arrival date & time 12/10/12  1353   First MD Initiated Contact with Patient 12/10/12 1354      Chief Complaint  Patient presents with  . Anemia    (Consider location/radiation/quality/duration/timing/severity/associated sxs/prior treatment) Patient is a 79 y.o. male presenting with anemia.  Anemia   Pt with recent BKA and admission for GI bleeding/anemia was sent from [facility] rehab today after bloodwork there showed Hgb 5.4. Pt denies any bloody or dark stools. Denies vomiting blood. No CP, SOB or general weakness. He has been in his normal state of health otherwise.   Past Medical History  Diagnosis Date  . Thyroid disease   . Anemia   . Hyperlipidemia   . Neuropathy, diabetic   . Diabetic retinopathy   . GERD (gastroesophageal reflux disease)   . Arthritis   . Urgency of urination   . Hypothyroidism   . Sacral wound     hx of  . Cancer   . Diabetic retinopathy   . Peripheral vascular disease   . Diabetes mellitus     sees Dr. Chilton Si [facility])  . CHF (congestive heart failure)   . Hypertension   . Prostate cancer     Past Surgical History  Procedure Laterality Date  . Cataract extraction, bilateral    . Colonoscopy w/ polypectomy    . Esophagogastroduodenoscopy  04/25/2012    Procedure: ESOPHAGOGASTRODUODENOSCOPY (EGD);  Surgeon: Meryl Dare, MD,FACG;  Location: [facility];  Service: Endoscopy;  Laterality: N/A;  . Amputation  05/16/2012    Procedure: AMPUTATION BELOW KNEE;  Surgeon: Nadara Mustard, MD;  Location: [facility] OR;  Service: Orthopedics;  Laterality: Right;  Right Below Knee Amputation  . Tonsillectomy    . Foot amputation      right bka  . Eye surgery      bilateral  . Amputation  09/14/2012    Procedure: AMPUTATION BELOW KNEE;  Surgeon: Nadara Mustard, MD;  Location: [facility] OR;  Service: Orthopedics;  Laterality: Left;  . Amputation Left 10/23/2012    Procedure: Left below knee amputation revision;  Surgeon: Nadara Mustard, MD;  Location: [facility] OR;  Service: Orthopedics;  Laterality: Left;  left below knee amputation revision  . Esophagogastroduodenoscopy N/A 11/07/2012    Procedure: ESOPHAGOGASTRODUODENOSCOPY (EGD);  Surgeon: Louis Meckel, MD;  Location: [facility]COPY;  Service: Endoscopy;  Laterality: N/A;    Family History  Problem Relation Age of Onset  . Hypertension      History  Substance Use Topics  . Smoking status: Former Smoker -- 0.25 packs/day    Types: Cigarettes    Quit date: 11/22/2006  . Smokeless tobacco: Never Used  . Alcohol Use: 0.0 oz/week    1-2 Cans of beer per week     Comment: drinks with meals      Review of Systems All other systems reviewed and are negative except as noted in HPI.   Allergies  Bactrim and Doxycycline  Home Medications   Current Outpatient Rx  Name  Route  Sig  Dispense  Refill  . albuterol (PROVENTIL HFA;VENTOLIN HFA) 108 (90 BASE) MCG/ACT inhaler   Inhalation   Inhale 2 puffs into the lungs every 6 (six) hours as needed for wheezing.         Marland Kitchen aspirin 81 MG chewable tablet   Oral   Chew 1 tablet (81 mg total) by mouth daily.         Marland Kitchen docusate sodium (  COLACE) 100 MG capsule   Oral   Take 100 mg by mouth 2 (two) times daily.         . feeding supplement (PRO-STAT SUGAR FREE 64) LIQD   Oral   Take 30 mLs by mouth 2 (two) times daily.         . ferrous sulfate 325 (65 FE) MG tablet   Oral   Take 325 mg by mouth daily with breakfast.         . insulin glargine (LANTUS) 100 UNIT/ML injection   Subcutaneous   Inject 15 Units into the skin at bedtime.          . insulin NPH-insulin regular (HUMULIN 70/30) (70-30) 100 UNIT/ML injection   Subcutaneous   Inject 25 Units into the skin daily with breakfast.          . ipratropium (ATROVENT) 0.02 % nebulizer solution   Nebulization   Take 500 mcg by nebulization every 2 (two) hours as needed (shortness of breath).         Marland Kitchen levothyroxine (SYNTHROID, LEVOTHROID) 88 MCG  tablet   Oral   Take 88 mcg by mouth daily.         . metoCLOPramide (REGLAN) 5 MG tablet   Oral   Take 5 mg by mouth 3 (three) times daily before meals.         . metoprolol tartrate (LOPRESSOR) 25 MG tablet   Oral   Take 12.5 mg by mouth 2 (two) times daily.         . mirtazapine (REMERON) 15 MG tablet   Oral   Take 7.5-15 mg by mouth See admin instructions. Take 15 mg at bedtime and Take 7.5mg  at 9 pm for appetite.         Marland Kitchen omeprazole (PRILOSEC) 20 MG capsule   Oral   Take 20 mg by mouth 2 (two) times daily.         Marland Kitchen oxyCODONE-acetaminophen (ROXICET) 5-325 MG per tablet   Oral   Take 1 tablet by mouth every 4 (four) hours as needed for pain.   60 tablet   0   . pantoprazole sodium (PROTONIX) 40 mg/20 mL PACK   Oral   Take 40 mg by mouth daily.         . promethazine (PHENERGAN) 25 MG tablet   Oral   Take 25 mg by mouth every 6 (six) hours as needed for nausea.           BP 110/48  Pulse 82  Temp(Src) 98.8 F (37.1 C) (Oral)  Resp 14  SpO2 98%  Physical Exam  Nursing note and vitals reviewed. Constitutional: He is oriented to person, place, and time. He appears well-developed and well-nourished.  HENT:  Head: Normocephalic and atraumatic.  Eyes: EOM are normal. Pupils are equal, round, and reactive to light.  Neck: Normal range of motion. Neck supple.  Cardiovascular: Normal rate, normal heart sounds and intact distal pulses.   Pulmonary/Chest: Effort normal and breath sounds normal.  Abdominal: Bowel sounds are normal. He exhibits no distension. There is no tenderness.  Genitourinary:  Normal color stool, heme positive  Musculoskeletal: He exhibits no edema and no tenderness.  Bilateral BKA  Neurological: He is alert and oriented to person, place, and time. He has normal strength. No cranial nerve deficit or sensory deficit.  Skin: Skin is warm and dry. No rash noted.  Psychiatric: He has a normal mood and affect.    ED Course  Procedures (including critical care time)  Labs Reviewed  CBC WITH DIFFERENTIAL - Abnormal; Notable for the following:    WBC 19.8 (*)    RBC 2.20 (*)    Hemoglobin 6.0 (*)    HCT 18.7 (*)    RDW 18.5 (*)    Platelets 638 (*)    Neutro Abs 14.7 (*)    Monocytes Absolute 1.3 (*)    All other components within normal limits  BASIC METABOLIC PANEL - Abnormal; Notable for the following:    Sodium 132 (*)    Chloride 94 (*)    Glucose, Bld 36 (*)    GFR calc non Af Amer 87 (*)    All other components within normal limits  APTT - Abnormal; Notable for the following:    aPTT 46 (*)    All other components within normal limits  OCCULT BLOOD, POC DEVICE - Abnormal; Notable for the following:    Fecal Occult Bld POSITIVE (*)    All other components within normal limits  URINE CULTURE  PROTIME-INR  URINALYSIS, ROUTINE W REFLEX MICROSCOPIC  TYPE AND SCREEN  PREPARE RBC (CROSSMATCH)   No results found.   1. GI bleed   2. Anemia   3. Hypoglycemia       MDM  MArked anemia on CBC. PRBC transfusion ordered. Also hypoglycemic on BMP. D50 ordered, D5 drip following. Discussed with Dr. Sherrie Mustache on call for Hospitalist who will admit.         Charles B. Bernette Mayers, MD 12/10/12 [phone]

## 2012-12-10 NOTE — ED Notes (Signed)
Blood started; fisher, MD paged about when to give IV Lasix

## 2012-12-10 NOTE — ED Notes (Signed)
Dr Sherrie Mustache states Q2H CBG; IV lasix between two units; verifies to not give D5% but continue with D5% and NS at 60 ml/hr until phamracy sends D% with NS and potassium

## 2012-12-10 NOTE — H&P (Signed)
[facility] History and Physical  Raj Landress ZOX:[id number] DOB: June 18, 1929 DOA: 12/10/2012  Referring physician: ED physician, Dr. Bernette Mayers PCP: [name], MD  Specialists:   Chief Complaint: Anemia.  HPI: [name] is a 79 y.o. male with a history significant for diabetes mellitus, peripheral vascular disease-status post bilateral BKA, coronary artery disease, and chronic systolic congestive heart failure with an ejection fraction of 40-45%. He was recently hospitalized from 11/06/2012 through 11/08/2012 for upper GI bleeding, (in the setting of Coumadin anticoagulation for prophylaxis from previous BKA). The EGD performed by gastroenterologist Dr. Arlyce Dice revealed an actively bleeding gastric polyp which was band ligated and questionable gastric/esophageal AVM. He was transfused 2 units of packed red blood cells during the hospitalization. His hemoglobin was 5.2 on admission and 8.1 at the time of discharge. Apparently, at the skilled nursing facility, routine blood work was ordered. It revealed a low hemoglobin of 5.4. When questioned, the patient denies bright red blood per rectum or black tarry stools. He denies vomiting or hematemesis. He has had nausea. He denies abdominal pain or abdominal cramping. He denies subjective fever or chills. He denies chest congestion or pain with urination. He does not believe he was given ibuprofen or Aleve, but he believes he takes an aspirin once daily.  In the emergency department, he is afebrile and hemodynamically stable, although his blood pressures on the lower end of normal. His lab data are significant for hemoglobin of 6.0, platelet count of 638, white blood cell count of 19.8, normal PT, slightly elevated PTT of 46 and sodium of 132. His venous glucose is 36. He is being admitted for further evaluation and management.    Review of Systems: As above in history present illness.  Past Medical History  Diagnosis Date  . Thyroid disease    . Anemia   . Hyperlipidemia   . Neuropathy, diabetic   . Diabetic retinopathy   . GERD (gastroesophageal reflux disease)   . Arthritis   . Urgency of urination   . Hypothyroidism   . Sacral wound     hx of  . Cancer   . Diabetic retinopathy   . Peripheral vascular disease     s/p BLE amputations.  . Diabetes mellitus     sees Dr. Chilton Si [facility])  . Chronic systolic heart failure 04/2012    EF 40-45%  . NSTEMI (non-ST elevated myocardial infarction) 04/2012  . Acute respiratory failure 04/2012    VDRF.  Marland Kitchen Prostate cancer   . Hypertension   . Gastric polyp 11/2012    Source of upper GI bleed. Status post band ligation.  . 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   Past Surgical History  Procedure Laterality Date  . Cataract extraction, bilateral    . Colonoscopy w/ polypectomy    . Esophagogastroduodenoscopy  04/25/2012    Procedure: ESOPHAGOGASTRODUODENOSCOPY (EGD);  Surgeon: Meryl Dare, MD,FACG;  Location: [facility];  Service: Endoscopy;  Laterality: N/A;  . Amputation  05/16/2012    Procedure: AMPUTATION BELOW KNEE;  Surgeon: Nadara Mustard, MD;  Location: [facility] OR;  Service: Orthopedics;  Laterality: Right;  Right Below Knee Amputation  . Tonsillectomy    . Foot amputation      right bka  . Eye surgery      bilateral  . Amputation  09/14/2012    Procedure: AMPUTATION BELOW KNEE;  Surgeon: Nadara Mustard, MD;  Location: [facility] OR;  Service: Orthopedics;  Laterality: Left;  . Amputation Left 10/23/2012    Procedure: Left below  knee amputation revision;  Surgeon: Nadara Mustard, MD;  Location: [facility] OR;  Service: Orthopedics;  Laterality: Left;  left below knee amputation revision  . Esophagogastroduodenoscopy N/A 11/07/2012    Procedure: ESOPHAGOGASTRODUODENOSCOPY (EGD);  Surgeon: Louis Meckel, MD;  Location: [facility] ENDOSCOPY;  Service: Endoscopy;  Laterality: N/A;   Social History: He is currently a resident at [facility] for rehabilitation. He is married. He has 4 children. He is a retired Physicist, medical. He stopped smoking 6 years ago after smoking many years. He drank beer on occasion prior to his stay at [facility]. He denies illicit drug use.   Allergies  Allergen Reactions  . Bactrim (Sulfamethoxazole W-Trimethoprim) Swelling    Lips, eyes, cheeks swollen  . Doxycycline Swelling    Lips, eyes, cheeks were all swollen    Family History  Problem Relation Age of Onset  . Hypertension    His mother died of complications from rheumatoid arthritis. His father is deceased, etiology of his death unknown.   Prior to Admission medications   Medication Sig Start Date End Date Taking? Authorizing Provider  albuterol (PROVENTIL HFA;VENTOLIN HFA) 108 (90 BASE) MCG/ACT inhaler Inhale 2 puffs into the lungs every 6 (six) hours as needed for wheezing.   Yes Historical Provider, MD  aspirin 81 MG chewable tablet Chew 1 tablet (81 mg total) by mouth daily. 04/26/12 04/26/13 Yes William S Minor, NP  docusate sodium (COLACE) 100 MG capsule Take 100 mg by mouth 2 (two) times daily.   Yes Historical Provider, MD  feeding supplement (PRO-STAT SUGAR FREE 64) LIQD Take 30 mLs by mouth 2 (two) times daily.   Yes Historical Provider, MD  ferrous sulfate 325 (65 FE) MG tablet Take 325 mg by mouth daily with breakfast.   Yes Historical Provider, MD  insulin glargine (LANTUS) 100 UNIT/ML injection Inject 15 Units into the skin at bedtime.    Yes Historical Provider, MD  insulin NPH-insulin regular (HUMULIN 70/30) (70-30) 100 UNIT/ML injection Inject 25 Units into the skin daily with breakfast.    Yes Historical Provider, MD  ipratropium (ATROVENT) 0.02 % nebulizer solution Take 500 mcg by nebulization every 2 (two) hours as needed (shortness of breath).   Yes Historical Provider, MD  levothyroxine (SYNTHROID, LEVOTHROID) 88 MCG tablet Take 88 mcg by mouth daily.   Yes Historical Provider, MD  metoCLOPramide (REGLAN) 5 MG tablet Take 5 mg by mouth 3 (three) times daily before meals.   Yes Historical Provider,  MD  metoprolol tartrate (LOPRESSOR) 25 MG tablet Take 12.5 mg by mouth 2 (two) times daily.   Yes Historical Provider, MD  mirtazapine (REMERON) 15 MG tablet Take 7.5-15 mg by mouth See admin instructions. Take 15 mg at bedtime and Take 7.5mg  at 9 pm for appetite.   Yes Historical Provider, MD  omeprazole (PRILOSEC) 20 MG capsule Take 20 mg by mouth 2 (two) times daily.   Yes Historical Provider, MD  oxyCODONE-acetaminophen (ROXICET) 5-325 MG per tablet Take 1 tablet by mouth every 4 (four) hours as needed for pain. 10/25/12  Yes Nadara Mustard, MD  pantoprazole sodium (PROTONIX) 40 mg/20 mL PACK Take 40 mg by mouth daily.   Yes Historical Provider, MD  promethazine (PHENERGAN) 25 MG tablet Take 25 mg by mouth every 6 (six) hours as needed for nausea.   Yes Historical Provider, MD   Physical Exam: Filed Vitals:   12/10/12 1500 12/10/12 1515 12/10/12 1545 12/10/12 1700  BP: 110/60 112/43 111/41   Pulse: 77 76 73  77  Temp:    97.6 F (36.4 C)  TempSrc:    Oral  Resp: 7 12 9 23   SpO2: 96% 95% 96%      General:  Alert 79 year old African-American man laying in bed, in no acute distress.  Eyes: Pupils are equal, round, reactive to light. Extra movements are intact. Conjunctivae are clear. Sclerae are white.  ENT: Oropharynx reveals mildly dry mucous membranes. Pallor. No posterior exudates or erythema. Multiple missing teeth.  Neck: Supple, no adenopathy, no thyromegaly, no JVD.  Cardiovascular: S1, S2, no murmurs rubs or gallops.  Respiratory: Breathing is nonlabored. Lungs are clear anteriorly with decreased breath sounds in the bases.  Abdomen: Positive bowel sounds, soft, nontender, nondistended. No masses palpated.  Rectal: Per ED physician Dr. Renae Gloss, patient's stool was brown and guaiac positive.  Skin: Mildly erythematous and ecchymotic left BKA (bandage taken off). No fluctuance.  Musculoskeletal: Status post bilateral BKA. No acute hot red joints.  Psychiatric: He has a  flat affect. He is alert and oriented to himself and hospital.  Neurologic: Cranial nerves II through XII are intact. He is able to raise both of the stumps of the bed against gravity approximately 20. He has good bilateral hand grip.  Labs on Admission:  Basic Metabolic Panel:  Recent Labs Lab 12/10/12 1502  NA 132*  K 3.9  CL 94*  CO2 32  GLUCOSE 36*  BUN 15  CREATININE 0.69  CALCIUM 8.5   Liver Function Tests: No results found for this basename: AST, ALT, ALKPHOS, BILITOT, PROT, ALBUMIN,  in the last 168 hours No results found for this basename: LIPASE, AMYLASE,  in the last 168 hours No results found for this basename: AMMONIA,  in the last 168 hours CBC:  Recent Labs Lab 12/10/12 1502  WBC 19.8*  NEUTROABS 14.7*  HGB 6.0*  HCT 18.7*  MCV 85.0  PLT 638*   Cardiac Enzymes: No results found for this basename: CKTOTAL, CKMB, CKMBINDEX, TROPONINI,  in the last 168 hours  BNP (last 3 results)  Recent Labs  04/11/12 0420 04/16/12 1020  PROBNP 3477.0* 2493.0*   CBG: No results found for this basename: GLUCAP,  in the last 168 hours  Radiological Exams on Admission: No results found.  EKG: None.  Assessment/Plan Principal Problem:   Acute blood loss anemia Active Problems:   GI bleed   Gastric polyp   Gastric AVM   Diabetes mellitus   Hypothyroidism   Protein-calorie malnutrition, moderate   Hypoglycemia associated with diabetes   Hyponatremia   1. Recurrent blood loss anemia/GI bleed. [name] hemoglobin was 8.1 approximately 4 weeks ago. During the previous hospitalization, the GI bleeding was attributed to a bleeding gastric polyp which received intervention by Dr. Arlyce Dice. The patient also had a questionable gastric/esophageal AVM. He is on aspirin and he is no longer on anticoagulation. According to Dr. Marzetta Board note, the plan was to repeat an endoscopy in approximately 6 weeks. 2. Type 2 diabetes mellitus with hypoglycemia. Surprisingly, the  patient has no immediate sequelae or altered mental status from hypoglycemia. He is treated chronically with Lantus and NPH insulin.  3. Hyponatremia. Likely secondary to hypovolemia. 4. Thrombocytosis. Likely reactive from anemia. 5. Leukocytosis. He does not have a fever. Leukocytosis can also be reactive. Will assess further with a chest x-ray and urinalysis. There is some mild erythema on the recent left BKA stump, query mild cellulitis. 6. Status post left lower extremity BKA in February 2014. We'll continue dressing changes as  per previous wound care at the skilled nursing facility.     Plan: 1. D50 x1 was given. We'll start D5 normal saline. We'll check his CBGs every 2 hours for the next 6-8 hours and then will change to q. a.c. and each bedtime thereafter. 2. We'll start IV Protonix every 12 hours. We'll hold aspirin. We'll start a clear liquid diet without advancement.  3. GI consulted. Discussed patient with Dr. Leone Payor. We'll make the patient n.p.o. after midnight just in case his colleagues decide to perform an EGD. He agrees with current management. 4. We'll hold metoprolol, due to low-normal blood pressures. 5. Wound care consult. 6. For further evaluation, we'll order a urinalysis, urine culture, and chest x-ray to assess for infection. 7. We'll order hemoglobin A1c and TSH. We'll order CBCs daily.   Code Status: Patient had outpatient DO NOT RESUSCITATE form on his chart. He confirms her DO NOT RESUSCITATE status. Family Communication: None available. Disposition Plan: Anticipate discharge back to the skilled nursing facility for rehabilitation when he is clinically improved.  Time spent: Greater than one hour.  [facility] Pager [phone].  If 7PM-7AM, please contact night-coverage [email or web address] Password [facility] 12/10/2012, 5:16 PM

## 2012-12-10 NOTE — ED Notes (Signed)
Pt's CBG was 26 when I checked it RN Laureen and dr is aware.4:50pm JG.

## 2012-12-10 NOTE — ED Notes (Signed)
Per EMS: pt from [facility] with c/o low hemoglobin; pt alert and oriented; pt double BKA; pt DNR; pt not on blood thinners; BP 110/50; staff denies blood in stool and vomit

## 2012-12-10 NOTE — ED Notes (Addendum)
MD at bedside states to start D5 with NS at 60 ml/hr; Jody, EMT sent to get blood for pt

## 2012-12-10 NOTE — ED Notes (Signed)
Report given to [place], Charity fundraiser. Informed nurse that pt would be given protonix and fluids started before being brought up to floor.

## 2012-12-11 DIAGNOSIS — K922 Gastrointestinal hemorrhage, unspecified: Secondary | ICD-10-CM

## 2012-12-11 DIAGNOSIS — I10 Essential (primary) hypertension: Secondary | ICD-10-CM

## 2012-12-11 DIAGNOSIS — Q2733 Arteriovenous malformation of digestive system vessel: Secondary | ICD-10-CM

## 2012-12-11 DIAGNOSIS — E119 Type 2 diabetes mellitus without complications: Secondary | ICD-10-CM

## 2012-12-11 DIAGNOSIS — D131 Benign neoplasm of stomach: Secondary | ICD-10-CM

## 2012-12-11 LAB — COMPREHENSIVE METABOLIC PANEL
AST: 12 U/L (ref 0–37)
CO2: 28 mEq/L (ref 19–32)
Calcium: 8.3 mg/dL — ABNORMAL LOW (ref 8.4–10.5)
Creatinine, Ser: 0.63 mg/dL (ref 0.50–1.35)
GFR calc non Af Amer: 89 mL/min — ABNORMAL LOW (ref >90)
Total Protein: 7 g/dL (ref 6.0–8.3)

## 2012-12-11 LAB — MRSA PCR SCREENING: MRSA by PCR: NEGATIVE

## 2012-12-11 LAB — TYPE AND SCREEN
ABO/RH(D): O POS
Antibody Screen: NEGATIVE
Unit division: 0

## 2012-12-11 LAB — CBC
MCH: 28.2 pg (ref 26.0–34.0)
MCHC: 33.7 g/dL (ref 30.0–36.0)
MCV: 83.6 fL (ref 78.0–100.0)
Platelets: 438 10*3/uL — ABNORMAL HIGH (ref 150–400)
RDW: 17.1 % — ABNORMAL HIGH (ref 11.5–15.5)

## 2012-12-11 LAB — GLUCOSE, CAPILLARY
Glucose-Capillary: 126 mg/dL — ABNORMAL HIGH (ref 70–99)
Glucose-Capillary: 110 mg/dL — ABNORMAL HIGH (ref 70–99)

## 2012-12-11 MED ORDER — BISACODYL 5 MG PO TBEC: 5.0000 mg | DELAYED_RELEASE_TABLET | Freq: Once | ORAL | Status: DC

## 2012-12-11 MED ORDER — PEG-KCL-NACL-NASULF-NA ASC-C 100 G PO SOLR
0.5000 | Freq: Once | ORAL | Status: AC
  Administered 2012-12-12: 50 g via ORAL
  Filled 2012-12-11: qty 1

## 2012-12-11 MED ORDER — DEXTROSE 5 % IV SOLN
1.0000 g | INTRAVENOUS | Status: DC
  Administered 2012-12-11 – 2012-12-13 (×3): 1 g via INTRAVENOUS
  Filled 2012-12-11 (×3): qty 10

## 2012-12-11 MED ORDER — PEG-KCL-NACL-NASULF-NA ASC-C 100 G PO SOLR: 1.0000 | Freq: Once | ORAL | Status: DC

## 2012-12-11 MED ORDER — BISACODYL 5 MG PO TBEC: 5.0000 mg | DELAYED_RELEASE_TABLET | Freq: Every day | ORAL | Status: DC | PRN

## 2012-12-11 MED ORDER — PANTOPRAZOLE SODIUM 40 MG PO TBEC
40.0000 mg | DELAYED_RELEASE_TABLET | Freq: Every day | ORAL | Status: DC
  Administered 2012-12-11 – 2012-12-12 (×2): 40 mg via ORAL
  Filled 2012-12-11: qty 1

## 2012-12-11 MED ORDER — CHLORHEXIDINE GLUCONATE CLOTH 2 % EX PADS
6.0000 | MEDICATED_PAD | Freq: Every day | CUTANEOUS | Status: DC
  Administered 2012-12-11 – 2012-12-13 (×3): 6 via TOPICAL

## 2012-12-11 MED ORDER — PEG-KCL-NACL-NASULF-NA ASC-C 100 G PO SOLR
0.5000 | Freq: Once | ORAL | Status: DC
  Filled 2012-12-11: qty 1

## 2012-12-11 MED ORDER — INSULIN ASPART 100 UNIT/ML ~~LOC~~ SOLN
0.0000 [IU] | Freq: Three times a day (TID) | SUBCUTANEOUS | Status: DC
  Administered 2012-12-11: 1 [IU] via SUBCUTANEOUS
  Administered 2012-12-12: 7 [IU] via SUBCUTANEOUS
  Administered 2012-12-12: 9 [IU] via SUBCUTANEOUS
  Administered 2012-12-13 (×2): 3 [IU] via SUBCUTANEOUS

## 2012-12-11 MED ORDER — BISACODYL 5 MG PO TBEC
5.0000 mg | DELAYED_RELEASE_TABLET | Freq: Once | ORAL | Status: AC
  Administered 2012-12-12: 5 mg via ORAL
  Filled 2012-12-11: qty 1

## 2012-12-11 MED ORDER — MUPIROCIN 2 % EX OINT
1.0000 "application " | TOPICAL_OINTMENT | Freq: Two times a day (BID) | CUTANEOUS | Status: DC
  Administered 2012-12-11 – 2012-12-13 (×5): 1 via NASAL
  Filled 2012-12-11 (×2): qty 22

## 2012-12-11 MED ORDER — BOOST / RESOURCE BREEZE PO LIQD
1.0000 | Freq: Three times a day (TID) | ORAL | Status: DC
  Administered 2012-12-11: 1 via ORAL

## 2012-12-11 NOTE — Consult Note (Signed)
WOC consult Note Reason for Consult: Consult requested for BKA sites.  Pt has stumps to left and right legs with healed incision sites. Wound type: Right stump with partial thickness wound, .3X.3X.1cm, pink and dry after loose scab removed.  No odor, small yellow drainage. Left stump with dry eschar to inner area, 2X.2cm, no odor or drainage. Dry peeling scabbed areas to lower stump which remove easily, revealing patchy areas of pink dry partial thickness wounds. Dressing procedure/placement/frequency: Foam dressings to protect and promote healing.  Pt can resume follow-up with ortho physician after discharge. Please re-consult if further assistance is needed.  Thank-you,  Cammie Mcgee MSN, RN, CWOCN, [place], CNS [phone]

## 2012-12-11 NOTE — Progress Notes (Signed)
[facility] PROGRESS NOTE  [name] WNU:[id number] DOB: 11/09/1928 DOA: 12/10/2012 PCP: [name], MD  Assessment/Plan:  Recurrent GI bleed with ABLA Hgb 5.6 on admission, transfused 2 units PRBCs, now Hgb 8.6 [facility] consulted, planning colonoscopy +/- EGD for 4/10 Recent History of Bleeding gastric polyp on coumadin.  PPI  DM Episodes of Hypoglycemia On D5 NS SSI-s no insulin for CBG less than 150. Hbg A1c pending. On lantus and 70/30 as outpatient.  Will simplify insulin regimen.  UTI U/A positive for infection.  Culture pending IV Rocephin  HTN Metoprolol being held  BP are wnl, pulse slightly brady. Will likely resume BB when patient is stronger.  Relatively new BKA sites Appreciate wound care consult Right stump partial thickness wound with slight yellow drainage. Left stump with dry eschar to inner area 2x2 cm.  Low albumin Malnutrition Nutrition consult requested.   DVT Prophylaxis:  N/A, GI Bleed with bilateral BKA  Code Status: DNR Family Communication:  Disposition Plan: Inpatient.  To SNF when ready.   Consultants:  [name]  Procedures: Colonoscopy planned for 4/10 (+/- EGD)  Antibiotics:  Rocphin  HPI/Subjective: No complaints.  Objective: Filed Vitals:   12/10/12 2345 12/11/12 0045 12/11/12 0142 12/11/12 0429  BP: 145/53 131/61 131/64 123/59  Pulse: 77 76 74 73  Temp: 97.5 F (36.4 C) 98 F (36.7 C) 97.5 F (36.4 C) 97.4 F (36.3 C)  TempSrc: Oral Oral Oral Oral  Resp: 14 14 12 14   Height:      Weight:    56.4 kg (124 lb 5.4 oz)  SpO2:        Intake/Output Summary (Last 24 hours) at 12/11/12 1029 Last data filed at 12/11/12 0900  Gross per 24 hour  Intake    325 ml  Output    550 ml  Net   -225 ml   Filed Weights   12/10/12 1946 12/11/12 0429  Weight: 56.1 kg (123 lb 10.9 oz) 56.4 kg (124 lb 5.4 oz)    Exam:   General:  Sleeping, lethargic, bilateral BKA, Does follow  commands.  Cardiovascular: RRR, no murmurs, rubs or gallops, no lower extremity edema  Respiratory: CTA, no wheeze, crackles, or rales.  No increased work of breathing.  Abdomen: Soft, non-tender, non-distended, + bowel sounds, no masses  Musculoskeletal: Bilateral BKA  Data Reviewed: Basic Metabolic Panel:  Recent Labs Lab 12/10/12 1502 12/11/12 0330  NA 132* 136  K 3.9 4.1  CL 94* 98  CO2 32 28  GLUCOSE 36* 114*  BUN 15 14  CREATININE 0.69 0.63  CALCIUM 8.5 8.3*   Liver Function Tests:  Recent Labs Lab 12/11/12 0330  AST 12  ALT <5  ALKPHOS 107  BILITOT 0.2*  PROT 7.0  ALBUMIN 1.3*   CBC:  Recent Labs Lab 12/10/12 1502 12/11/12 0330  WBC 19.8* 15.3*  NEUTROABS 14.7*  --   HGB 6.0* 8.6*  HCT 18.7* 25.5*  MCV 85.0 83.6  PLT 638* 438*   BNP (last 3 results)  Recent Labs  04/11/12 0420 04/16/12 1020  PROBNP 3477.0* 2493.0*   CBG:  Recent Labs Lab 12/10/12 2320 12/10/12 2354 12/11/12 0207 12/11/12 0618 12/11/12 0757  GLUCAP 65* 104* 116* 119* 126*    Recent Results (from the past 240 hour(s))  MRSA PCR SCREENING     Status: None   Collection Time    12/10/12 10:17 PM      Result Value Range Status   MRSA by PCR NEGATIVE  NEGATIVE  Final   Comment:            The GeneXpert MRSA Assay (FDA     approved for NASAL specimens     only), is one component of a     comprehensive MRSA colonization     surveillance program. It is not     intended to diagnose MRSA     infection nor to guide or     monitor treatment for     MRSA infections.     Studies: Dg Chest Port 1 View  12/10/2012  *RADIOLOGY REPORT*  Clinical Data: No bleed.  PORTABLE CHEST - 1 VIEW  Comparison: 09/13/2012  Findings: No infiltrates, edema or pleural effusions.  Subtle area of potential nodularity is seen in the mid right lung.  This may relate to a rib or calcified pleural plaque.  Recommend performing a PA and lateral study in follow-up.  The heart size is stable and  within normal limits.  IMPRESSION: No active disease.  Possible subtle nodularity in the right mid lung.  Recommend initial performance of a PA and lateral study in follow-up.   Original Report Authenticated By: Irish Lack, M.D.     Scheduled Meds: . cefTRIAXone (ROCEPHIN)  IV  1 g Intravenous Q24H  . Chlorhexidine Gluconate Cloth  6 each Topical Q0600  . dextrose   Intravenous Once  . feeding supplement  30 mL Oral BID  . insulin aspart  0-9 Units Subcutaneous TID WC  . levothyroxine  88 mcg Oral QAC breakfast  . metoCLOPramide  5 mg Oral TID AC  . mirtazapine  22.5 mg Oral QHS  . mupirocin ointment  1 application Nasal BID  . pantoprazole (PROTONIX) IV  40 mg Intravenous Q12H   Continuous Infusions: . dextrose 5 % and 0.9 % NaCl with KCl 20 mEq/L 100 mL/hr at 12/11/12 9604    Principal Problem:   Acute blood loss anemia Active Problems:   Diabetes mellitus   Hypothyroidism   Protein-calorie malnutrition, moderate   Gastric polyp   Gastric AVM   GI bleed   Hypoglycemia associated with diabetes   Hyponatremia    Conley Canal [facility] Pager [phone]. If 7PM-7AM, please contact night-coverage at [email or web address], password [facility] 12/11/2012, 10:29 AM  LOS: 1 day   Attending Patient seen and examined, agree with the assessment and plan as outlined above. Hold ASA-getting EGD/Colonoscopy 4/10. [facility]

## 2012-12-11 NOTE — Progress Notes (Signed)
INITIAL NUTRITION ASSESSMENT  DOCUMENTATION CODES Per approved criteria  -Non-severe (moderate) malnutrition in the context of chronic illness   INTERVENTION: 1. Resource Breeze po TID, each supplement provides 250 kcal and 9 grams of protein. Once pt has advanced diet will transition to Ensure TID    NUTRITION DIAGNOSIS: Inadequate oral intake  related to dislike of facility foods as evidenced by weight loss/wasting.   Goal: PO intake to meet >/=90% estimated nutrition needs to promote wound healing  Monitor:  PO intake, weight trends, labs   Reason for Assessment: Consult   79 y.o. male  Admitting Dx: Acute blood loss anemia  ASSESSMENT: Pt admitted with low hgb, GI bleed. Pt s/p bilateral BKA's.  Per notes, "Low Albumin, Malnutrition"  Note that low albumin is no longer used to diagnose malnutrition; [facility] uses the new malnutrition guidelines published by the American Society for Parenteral and Enteral Nutrition (A.S.P.E.N.) and the Academy of Nutrition and Dietetics (AND).    Pt weight is down 6 lbs (4.6% body weight) in one month, not significant. Pt states he is not eating well because he doesn't like the food at [facility]. States that he is eating less then half of his meals but is able to drink Ensure.   Nutrition Focused Physical Exam:  Subcutaneous Fat:  Orbital Region: WNL Upper Arm Region: mild depletion Thoracic and Lumbar Region: n/a  Muscle:  Temple Region: mild wasting Clavicle Bone Region: WNL Clavicle and Acromion Bone Region: WNL Scapular Bone Region: n/a Dorsal Hand: n/a Patellar Region: n/a Anterior Thigh Region: n/a Posterior Calf Region: pt is s/p BKA's  Edema: n/a  Pt meets criteria for moderate malnutrition in the context of chronic illness 2/2 mild fat/muscle wasting and meeting =75% estimated nutrition needs for > 1 month.   Height: Ht Readings from Last 1 Encounters:  12/10/12 5' (1.524 m)   Ht Readings from Last 3  Encounters:  12/10/12 5' (1.524 m)  11/06/12 6' (1.829 m)  11/06/12 6' (1.829 m)     Weight: Wt Readings from Last 1 Encounters:  12/11/12 124 lb 5.4 oz (56.4 kg)    Ideal Body Weight: 160 lbs Adj for bilateral BKA  % Ideal Body Weight: 78%  Wt Readings from Last 10 Encounters:  12/11/12 124 lb 5.4 oz (56.4 kg)  11/06/12 130 lb 11.7 oz (59.3 kg)  11/06/12 130 lb 11.7 oz (59.3 kg)  10/22/12 132 lb 8 oz (60.102 kg)  10/22/12 132 lb 8 oz (60.102 kg)  09/13/12 140 lb (63.504 kg)  09/13/12 140 lb (63.504 kg)  09/13/12 140 lb (63.504 kg)  08/21/12 134 lb (60.782 kg)  07/29/12 132 lb (59.875 kg)    Usual Body Weight: Pt with recent BKA, previous weight was 132 lbs prior to amp.   % Usual Body Weight: 93%  BMI:  18.7 kg/(m^2) WNL (adj for BKA's)  Estimated Nutritional Needs: Kcal: 1500-1800 Protein: 60-70 gm  Fluid: 1.5-1.8 L   Skin: recent BKA's-healing   Diet Order: Clear Liquid  EDUCATION NEEDS: -No education needs identified at this time   Intake/Output Summary (Last 24 hours) at 12/11/12 1130 Last data filed at 12/11/12 0900  Gross per 24 hour  Intake    325 ml  Output    550 ml  Net   -225 ml    Last BM: 4/9    Labs:   Recent Labs Lab 12/10/12 1502 12/11/12 0330  NA 132* 136  K 3.9 4.1  CL 94* 98  CO2  32 28  BUN 15 14  CREATININE 0.69 0.63  CALCIUM 8.5 8.3*  GLUCOSE 36* 114*    CBG (last 3)   Recent Labs  12/11/12 0207 12/11/12 0618 12/11/12 0757  GLUCAP 116* 119* 126*    Scheduled Meds: . cefTRIAXone (ROCEPHIN)  IV  1 g Intravenous Q24H  . Chlorhexidine Gluconate Cloth  6 each Topical Q0600  . dextrose   Intravenous Once  . feeding supplement  30 mL Oral BID  . insulin aspart  0-9 Units Subcutaneous TID WC  . levothyroxine  88 mcg Oral QAC breakfast  . metoCLOPramide  5 mg Oral TID AC  . mirtazapine  22.5 mg Oral QHS  . mupirocin ointment  1 application Nasal BID  . pantoprazole  40 mg Oral Q0600  . peg 3350 powder  0.5  kit Oral Once   And  . [START ON 12/12/2012] peg 3350 powder  0.5 kit Oral Once    Continuous Infusions: . dextrose 5 % and 0.9 % NaCl with KCl 20 mEq/L 100 mL/hr at 12/11/12 2130    Past Medical History  Diagnosis Date  . Thyroid disease   . Anemia   . Hyperlipidemia   . Neuropathy, diabetic   . Diabetic retinopathy   . GERD (gastroesophageal reflux disease)   . Arthritis   . Urgency of urination   . Hypothyroidism   . Sacral wound     hx of  . Cancer   . Diabetic retinopathy   . Peripheral vascular disease     s/p BLE amputations.  . Diabetes mellitus     sees Dr. Chilton [facility])  . Chronic systolic heart failure 04/2012    EF 40-45%  . NSTEMI (non-ST elevated myocardial infarction) 04/2012  . Acute respiratory failure 04/2012    VDRF.  Marland Kitchen Prostate cancer   . Hypertension   . Gastric polyp 11/2012    Source of upper GI bleed. Status post band ligation.  . 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    Past Surgical History  Procedure Laterality Date  . Cataract extraction, bilateral    . Colonoscopy w/ polypectomy    . Esophagogastroduodenoscopy  04/25/2012    Procedure: ESOPHAGOGASTRODUODENOSCOPY (EGD);  Surgeon: Meryl Dare, MD,FACG;  Location: [facility];  Service: Endoscopy;  Laterality: N/A;  . Amputation  05/16/2012    Procedure: AMPUTATION BELOW KNEE;  Surgeon: Nadara Mustard, MD;  Location: [facility] OR;  Service: Orthopedics;  Laterality: Right;  Right Below Knee Amputation  . Tonsillectomy    . Foot amputation      right bka  . Eye surgery      bilateral  . Amputation  09/14/2012    Procedure: AMPUTATION BELOW KNEE;  Surgeon: Nadara Mustard, MD;  Location: [facility] OR;  Service: Orthopedics;  Laterality: Left;  . Amputation Left 10/23/2012    Procedure: Left below knee amputation revision;  Surgeon: Nadara Mustard, MD;  Location: [facility] OR;  Service: Orthopedics;  Laterality: Left;  left below knee amputation revision  . Esophagogastroduodenoscopy N/A 11/07/2012    Procedure:  ESOPHAGOGASTRODUODENOSCOPY (EGD);  Surgeon: Louis Meckel, MD;  Location: [facility] ENDOSCOPY;  Service: Endoscopy;  Laterality: N/A;    Clarene Duke RD, LDN Pager [phone] After Hours pager [phone]

## 2012-12-11 NOTE — Consult Note (Signed)
[facility] Consult: 9:06 AM 12/11/2012   Referring Provider: Denise [name] Primary Care Physician:  HENDERSON, WENDY, MD Primary Gastroenterologist:  Dr. Stark  Reason for Consultation:  [name]   HPI: [name] is a 79 y.o. male.  Hx IDDM, peripheral vascular disease-status post bilateral BKA with the left BKA on 10/23/12, coronary artery disease, and chronic systolic congestive heart failure with an ejection fraction of 40-45%.  Hx esophageal candidiasis 2013, Hyperplastic gastric 2009, 2013.  H Pylori treated in 2009. Started coumadin in Jan 2014 after left BKA. Transfused with blood then for post op anemia.  Underwent EGD for eval of hemetemesis (INR 3.5) by Dr [name] on 11/06/12. Gastric polyp was actively bleeding and band ligated.  ? Of gastric/esophageal AVM.  Required 2 units of PRBCs.  He had been taking Omeprazole at SNF.  Discharged on daily Iron, Omeprazole 20 mg BID as well as Protonix 40 mg daily.   Warfarin was discontinued after 11/06/12 admission.  Has ROV with Dr Stark on 12/16/12.   Now admitted with recurrent anemia, Hgb of 5.4 at SNF. INR is 1.2.  No melena, hematochezia but FOB is positive.  No nausea, no emesis reported.  Does have poor  Appetite. On daily ASA but denies other NSAIDs.  S/p transfusion with 2 units PRBCs with follow up Hgb of 8.6 TSH is normal at 3.9.      Past Medical History  Diagnosis Date  . Thyroid disease   . Anemia   . Hyperlipidemia   . Neuropathy, diabetic   . Diabetic retinopathy   . GERD (gastroesophageal reflux disease)   . Arthritis   . Urgency of urination   . Hypothyroidism   . Sacral wound     hx of  . Cancer   . Diabetic retinopathy   . Peripheral vascular disease     s/p BLE amputations.  . Diabetes mellitus     sees Dr. Green ([facility])  . Chronic systolic heart failure 04/2012    EF 40-45%  . NSTEMI (non-ST elevated myocardial infarction) 04/2012  . Acute  respiratory failure 04/2012    VDRF.  . Prostate cancer   . Hypertension   . Gastric polyp 11/2012    Source of upper GI bleed. Status post band ligation.  . 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    Past Surgical History  Procedure Laterality Date  . Cataract extraction, bilateral    . Colonoscopy w/ polypectomy    . Esophagogastroduodenoscopy  04/25/2012    Procedure: ESOPHAGOGASTRODUODENOSCOPY (EGD);  Surgeon: Malcolm T Stark, MD,FACG;  Location: [facility] ENDOSCOPY;  Service: Endoscopy;  Laterality: N/A;  . Amputation  05/16/2012    Procedure: AMPUTATION BELOW KNEE;  Surgeon: Marcus V Duda, MD;  Location: [facility] OR;  Service: Orthopedics;  Laterality: Right;  Right Below Knee Amputation  . Tonsillectomy    . Foot amputation      right bka  . Eye surgery      bilateral  . Amputation  09/14/2012    Procedure: AMPUTATION BELOW KNEE;  Surgeon: Marcus V Duda, MD;  Location: [facility] OR;  Service: Orthopedics;  Laterality: Left;  . Amputation Left 10/23/2012    Procedure: Left below knee amputation revision;  Surgeon: Marcus V Duda, MD;  Location: [facility] OR;  Service: Orthopedics;  Laterality: Left;  left below knee amputation revision  . Esophagogastroduodenoscopy N/A 11/07/2012    Procedure: ESOPHAGOGASTRODUODENOSCOPY (EGD);  Surgeon: Robert D Kaplan, MD;  Location: [facility] ENDOS[facility];  Service: Endoscopy;  Laterality: N/A;    Prior   to Admission medications   Medication Sig Start Date End Date Taking? Authorizing Provider  albuterol (PROVENTIL HFA;VENTOLIN HFA) 108 (90 BASE) MCG/ACT inhaler Inhale 2 puffs into the lungs every 6 (six) hours as needed for wheezing.   Yes Historical Provider, MD  aspirin 81 MG chewable tablet Chew 1 tablet (81 mg total) by mouth daily. 04/26/12 04/26/13 Yes William S Minor, NP  docusate sodium (COLACE) 100 MG capsule Take 100 mg by mouth 2 (two) times daily.   Yes Historical Provider, MD  feeding supplement (PRO-STAT SUGAR FREE 64) LIQD Take 30 mLs by mouth 2 (two) times daily.   Yes Historical Provider,  MD  ferrous sulfate 325 (65 FE) MG tablet Take 325 mg by mouth daily with breakfast.   Yes Historical Provider, MD  insulin glargine (LANTUS) 100 UNIT/ML injection Inject 15 Units into the skin at bedtime.    Yes Historical Provider, MD  insulin NPH-insulin regular (HUMULIN 70/30) (70-30) 100 UNIT/ML injection Inject 25 Units into the skin daily with breakfast.    Yes Historical Provider, MD  ipratropium (ATROVENT) 0.02 % nebulizer solution Take 500 mcg by nebulization every 2 (two) hours as needed (shortness of breath).   Yes Historical Provider, MD  levothyroxine (SYNTHROID, LEVOTHROID) 88 MCG tablet Take 88 mcg by mouth daily.   Yes Historical Provider, MD  metoCLOPramide (REGLAN) 5 MG tablet Take 5 mg by mouth 3 (three) times daily before meals.   Yes Historical Provider, MD  metoprolol tartrate (LOPRESSOR) 25 MG tablet Take 12.5 mg by mouth 2 (two) times daily.   Yes Historical Provider, MD  mirtazapine (REMERON) 15 MG tablet Take 22.5 mg by mouth at bedtime.   Yes Historical Provider, MD  omeprazole (PRILOSEC) 20 MG capsule Take 20 mg by mouth 2 (two) times daily.   Yes Historical Provider, MD  oxyCODONE-acetaminophen (ROXICET) 5-325 MG per tablet Take 1 tablet by mouth every 4 (four) hours as needed for pain. 10/25/12  Yes Marcus V Duda, MD  pantoprazole sodium (PROTONIX) 40 mg/20 mL PACK Take 40 mg by mouth daily.   Yes Historical Provider, MD  promethazine (PHENERGAN) 25 MG tablet Take 25 mg by mouth every 6 (six) hours as needed for nausea.   Yes Historical Provider, MD    Scheduled Meds: . Chlorhexidine Gluconate Cloth  6 each Topical Q0600  . dextrose   Intravenous Once  . feeding supplement  30 mL Oral BID  . levothyroxine  88 mcg Oral QAC breakfast  . metoCLOPramide  5 mg Oral TID AC  . mirtazapine  22.5 mg Oral QHS  . mupirocin ointment  1 application Nasal BID  . pantoprazole (PROTONIX) IV  40 mg Intravenous Q12H   Infusions: . dextrose 5 % and 0.9 % NaCl with KCl 20 mEq/L  100 mL/hr at 12/11/12 0525   PRN Meds: acetaminophen, acetaminophen, albuterol, morphine injection   Allergies as of 12/10/2012 - Review Complete 12/10/2012  Allergen Reaction Noted  . Bactrim (sulfamethoxazole w-trimethoprim) Swelling 05/26/2012  . Doxycycline Swelling 05/26/2012    Family History  Problem Relation Age of Onset  . Hypertension      History   Social History  . Marital Status: Married    Spouse Name: N/A    Number of Children: N/A  . Years of Education: N/A   Occupational History  . 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 Smoking status: Former Smoker -- 0.25 packs/day    Types: Cigarettes    Quit   date: 11/22/2006  . Smokeless tobacco: Never Used  . Alcohol Use: 0.0 oz/week    1-2 Cans of beer per week     Comment: drinks with meals  . Drug Use: No  . Sexually Active: No   Other Topics Concern  . Not on file   Social History Narrative  . No narrative on file    REVIEW OF SYSTEMS: Feels ok.  Occasional phantom pain in legs No bleeding, just clear oozing from the left BKA site.  No nose blees No dysuria or hematuria.  No swelling of limbs.  No headache or dizziness.   No dyspnea.  No chest pain or palpitaitons.    PHYSICAL EXAM: Vital signs in last 24 hours: Temp:  [97.4 F (36.3 C)-98.8 F (37.1 C)] 97.4 F (36.3 C) (04/09 0429) Pulse Rate:  [73-82] 73 (04/09 0429) Resp:  [7-23] 14 (04/09 0429) BP: (109-145)/(41-70) 123/59 mmHg (04/09 0429) SpO2:  [95 %-100 %] 100 % (04/08 1946) Weight:  [56.1 kg (123 lb 10.9 oz)-56.4 kg (124 lb 5.4 oz)] 56.4 kg (124 lb 5.4 oz) (04/09 0429)  General: pleasant, looks well Head:  No swelling  Eyes:  Conjunctiva pale Ears:  Not HOH  Nose:  No discharge Mouth:  Pink, slightly dry and clear oral MM Neck:  No JVD or masses Lungs:  Clear.  No dyspnea or cough Heart: RRR.  No MRG Abdomen:  Soft, not tender, shelf like edge in left upper quadrant ? Spleen tip/? Mass/?column of  stool.   Rectal: brown stool is FOB trace positive.   Musc/Skeltl:  GU:  No scrotal edema Extremities:  s/p BKA, no blood on left bandage.  Neurologic:  Oriented x 3.  No tremor Skin:  No telangectasia, no rash Tattoos:  none Nodes:  No inguinal adenopathy   Psych:  Pleasant, not agitated, affect somewhat blunted but fully conversant and engaged.   Intake/Output from previous day: 04/08 0701 - 04/09 0700 In: 325 [Blood:325] Out: 550 [Urine:550] Intake/Output this shift:    LAB RESULTS:  Recent Labs  12/10/12 1502 12/11/12 0330  WBC 19.8* 15.3*  HGB 6.0* 8.6*  HCT 18.7* 25.5*  PLT 638* 438*   BMET Lab Results  Component Value Date   NA 136 12/11/2012   NA 132* 12/10/2012   NA 139 11/07/2012   K 4.1 12/11/2012   K 3.9 12/10/2012   K 3.8 11/07/2012   CL 98 12/11/2012   CL 94* 12/10/2012   CL 106 11/07/2012   CO2 28 12/11/2012   CO2 32 12/10/2012   CO2 28 11/07/2012   GLUCOSE 114* 12/11/2012   GLUCOSE 36* 12/10/2012   GLUCOSE 239* 11/07/2012   BUN 14 12/11/2012   BUN 15 12/10/2012   BUN 26* 11/07/2012   CREATININE 0.63 12/11/2012   CREATININE 0.69 12/10/2012   CREATININE 0.70 11/07/2012   CALCIUM 8.3* 12/11/2012   CALCIUM 8.5 12/10/2012   CALCIUM 7.9* 11/07/2012   LFT  Recent Labs  12/11/12 0330  PROT 7.0  ALBUMIN 1.3*  AST 12  ALT <5  ALKPHOS 107  BILITOT 0.2*   PT/INR Lab Results  Component Value Date   INR 1.22 12/10/2012   INR 1.22 11/06/2012   INR 3.52* 11/06/2012   Hepatitis Panel No results found for this basename: HEPBSAG, HCVAB, HEPAIGM, HEPBIGM,  in the last 72 hours C-Diff No components found with this basename: cdiff    Drugs of Abuse     Component Value Date/Time   LABOPIA NONE DETECTED 04/21/2011   0143   COCAINSCRNUR NONE DETECTED 04/21/2011 0143   LABBENZ NONE DETECTED 04/21/2011 0143   AMPHETMU NONE DETECTED 04/21/2011 0143   THCU NONE DETECTED 04/21/2011 0143   LABBARB NONE DETECTED 04/21/2011 0143     RADIOLOGY STUDIES: Dg Chest Port 1 View  12/10/2012  *RADIOLOGY  REPORT*  Clinical Data: No bleed.  PORTABLE CHEST - 1 VIEW  Comparison: 09/13/2012  Findings: No infiltrates, edema or pleural effusions.  Subtle area of potential nodularity is seen in the mid right lung.  This may relate to a rib or calcified pleural plaque.  Recommend performing a PA and lateral study in follow-up.  The heart size is stable and within normal limits.  IMPRESSION: No active disease.  Possible subtle nodularity in the right mid lung.  Recommend initial performance of a PA and lateral study in follow-up.   Original Report Authenticated By: Glenn Yamagata, M.D.     ENDOSCOPIC STUDIES: 11/07/2012   EGD for hemetemesis ENDOSCOPIC IMPRESSION:  Bleeding gastric polyp-status post band ligation  Possible single gastric and esophageal AVM  RECOMMENDATIONS:  1. Continue PPI therapy  2. followup endoscopy in approximately 6 weeks  04/2012  EGD for dysphagia ENDOSCOPIC IMPRESSION:  1. White esophageal exudates consistent with candidiasis; multiple  biopsies  2. Pedunculated polyp measuring 1.5 cm in size in the gastric  antrum; multiple biopsies  3. Atrophic gastritis  4. Duodenitis  RECOMMENDATIONS:  1. Await pathology results  2. Diflucan for 7-10 days  2011  EGD Findings  Normal: Proximal Esophagus to Distal Esophagus. Comments: tortuous  esophagus.  ANGIODYSPLASIA (AVMs): Total of 3 AVMs, maximum size 4 mm, in Fundus.  ICD9: Angiodysplasia of Stomach & Duodenum: 537.83.  Normal: Body.  POLYP: in Antrum. Maximum size: 20 mm. pedunculated polyp. Procedure:  snare with cautery, removed, Polyp retrieved, sent to pathology.  ICD9: Neoplasia, Benign, Stomach: 211.1. Comments: on lesser  curvature of antrum, 12 o'clock position.  Normal: Pyloric Sphincter to Duodenal 2nd Portion.     IMPRESSION: *  Recurrent transfusion requiring anemia with heme + stool but no melena, BPR or hemetemesis *  hemetemesis and anemia fist week of 11/2012.  EGD finding of gastric polyp that was  banded and question of gastric and esophageal AVM.  Hx of hyperplastic gastric polyps.  Pt has been on redundant doses of PPI. S/p 2 units PRBCs *  S/p left BKA 10/2012  *  UTI.  On Rocephin.    PLAN: *  Colonoscopy +/- EGD tomorrow. Pt agreeable. Split dose Movi prep ordered.  *  Once daily Protonix.    LOS: 1 day   Sarah Gribbin  12/11/2012, 9:06 AM Pager: [phone]     ________________________________________________________________________  [facility] MD note:  I personally examined the patient, reviewed the data and agree with the assessment and plan described above. Reviewed recent EGD.  Does not appear to have had colonoscopy in at least many years. Planning on colonoscopy +/- EGD tomorrow.   Orlie Cundari, MD [facility] Pager [phone]  

## 2012-12-11 NOTE — Care Management Note (Signed)
    Page 1 of 1   12/13/2012     3:27:06 PM   CARE MANAGEMENT NOTE 12/13/2012  Patient:  [name]   Account Number:  [id number]  Date Initiated:  12/11/2012  Documentation initiated by:  Letha Cape  Subjective/Objective Assessment:   dx acute blood loss anemia  admit- from [facility].     Action/Plan:   Anticipated DC Date:  12/13/2012   Anticipated DC Plan:  SKILLED NURSING FACILITY  In-house referral  Clinical Social Worker      DC Planning Services  CM consult      Choice offered to / List presented to:             Status of service:  Completed, signed off Medicare Important Message given?   (If response is "NO", the following Medicare IM given date fields will be blank) Date Medicare IM given:   Date Additional Medicare IM given:    Discharge Disposition:  SKILLED NURSING FACILITY  Per UR Regulation:  Reviewed for med. necessity/level of care/duration of stay  If discussed at Long Length of Stay Meetings, dates discussed:    Comments:  12/13/12 15:26 Letha Cape RN, BSN [phone] patient is for dc to [place] today, CSW following.  12/11/12 16:29 Letha Cape RN, BSN [phone] patient is from [facility], CSW referral.

## 2012-12-12 ENCOUNTER — Encounter (HOSPITAL_COMMUNITY): Payer: Self-pay

## 2012-12-12 ENCOUNTER — Encounter (HOSPITAL_COMMUNITY)
Admission: EM | Disposition: A | Discharge status: Skilled Nursing Facility | Payer: Self-pay | Source: Home / Self Care | Attending: Internal Medicine

## 2012-12-12 HISTORY — PX: ESOPHAGOGASTRODUODENOSCOPY: SHX5428

## 2012-12-12 HISTORY — PX: COLONOSCOPY: SHX5424

## 2012-12-12 LAB — BASIC METABOLIC PANEL
BUN: 11 mg/dL (ref 6–23)
CO2: 26 mEq/L (ref 19–32)
Chloride: 99 mEq/L (ref 96–112)
GFR calc non Af Amer: >90 mL/min (ref >90)
Glucose, Bld: 265 mg/dL — ABNORMAL HIGH (ref 70–99)
Potassium: 4.9 mEq/L (ref 3.5–5.1)
Sodium: 133 mEq/L — ABNORMAL LOW (ref 135–145)

## 2012-12-12 LAB — CBC
HCT: 27.9 % — ABNORMAL LOW (ref 39.0–52.0)
Hemoglobin: 9.4 g/dL — ABNORMAL LOW (ref 13.0–17.0)
MCH: 28.5 pg (ref 26.0–34.0)
MCHC: 33.7 g/dL (ref 30.0–36.0)
MCV: 84.5 fL (ref 78.0–100.0)
RBC: 3.3 MIL/uL — ABNORMAL LOW (ref 4.22–5.81)

## 2012-12-12 LAB — GLUCOSE, CAPILLARY
Glucose-Capillary: 212 mg/dL — ABNORMAL HIGH (ref 70–99)
Glucose-Capillary: 343 mg/dL — ABNORMAL HIGH (ref 70–99)
Glucose-Capillary: 365 mg/dL — ABNORMAL HIGH (ref 70–99)
Glucose-Capillary: 355 mg/dL — ABNORMAL HIGH (ref 70–99)
Glucose-Capillary: 250 mg/dL — ABNORMAL HIGH (ref 70–99)

## 2012-12-12 LAB — HEMOGLOBIN A1C: Mean Plasma Glucose: 157 mg/dL — ABNORMAL HIGH (ref <117)

## 2012-12-12 SURGERY — COLONOSCOPY
Anesthesia: Moderate Sedation

## 2012-12-12 MED ORDER — FENTANYL CITRATE 0.05 MG/ML IJ SOLN
INTRAMUSCULAR | Status: DC | PRN
  Administered 2012-12-12: 25 ug via INTRAVENOUS

## 2012-12-12 MED ORDER — MIDAZOLAM HCL 10 MG/2ML IJ SOLN
INTRAMUSCULAR | Status: DC | PRN
  Administered 2012-12-12: 1 mg via INTRAVENOUS
  Administered 2012-12-12: 2 mg via INTRAVENOUS

## 2012-12-12 MED ORDER — PEG-KCL-NACL-NASULF-NA ASC-C 100 G PO SOLR: 1.0000 | Freq: Once | ORAL | Status: DC

## 2012-12-12 MED ORDER — PEG-KCL-NACL-NASULF-NA ASC-C 100 G PO SOLR
0.5000 | Freq: Once | ORAL | Status: AC
  Administered 2012-12-12: 50 g via ORAL
  Filled 2012-12-12: qty 1

## 2012-12-12 MED ORDER — FENTANYL CITRATE 0.05 MG/ML IJ SOLN
INTRAMUSCULAR | Status: AC
  Filled 2012-12-12: qty 4

## 2012-12-12 MED ORDER — PEG-KCL-NACL-NASULF-NA ASC-C 100 G PO SOLR
0.5000 | Freq: Once | ORAL | Status: AC
  Administered 2012-12-13: 50 g via ORAL

## 2012-12-12 MED ORDER — DIPHENHYDRAMINE HCL 50 MG/ML IJ SOLN
INTRAMUSCULAR | Status: AC
  Filled 2012-12-12: qty 1

## 2012-12-12 MED ORDER — INSULIN GLARGINE 100 UNIT/ML ~~LOC~~ SOLN
8.0000 [IU] | Freq: Every day | SUBCUTANEOUS | Status: DC
  Administered 2012-12-12: 8 [IU] via SUBCUTANEOUS
  Filled 2012-12-12 (×2): qty 0.08

## 2012-12-12 MED ORDER — SODIUM CHLORIDE 0.9 % IV SOLN
INTRAVENOUS | Status: DC
  Administered 2012-12-12: 17:00:00 via INTRAVENOUS

## 2012-12-12 MED ORDER — SUCRALFATE 1 GM/10ML PO SUSP
1.0000 g | Freq: Four times a day (QID) | ORAL | Status: DC
  Administered 2012-12-12 (×2): 1 g via ORAL
  Filled 2012-12-12 (×7): qty 10

## 2012-12-12 MED ORDER — FLUCONAZOLE 100 MG PO TABS
100.0000 mg | ORAL_TABLET | Freq: Every day | ORAL | Status: DC
  Administered 2012-12-12: 100 mg via ORAL
  Filled 2012-12-12 (×2): qty 1

## 2012-12-12 MED ORDER — MIDAZOLAM HCL 5 MG/ML IJ SOLN
INTRAMUSCULAR | Status: AC
  Filled 2012-12-12: qty 3

## 2012-12-12 NOTE — Progress Notes (Signed)
PATIENT DETAILS Name: [name] Age: 79 y.o. Sex: male Date of Birth: [birth year] Admit Date: 12/10/2012 Admitting Physician Elliot Cousin, MD [id number], Toniann Fail, MD  Subjective: No major events overnight  Assessment/Plan: Principal Problem:   Acute blood loss anemia -2/2 to GI bleed-not sure whether this is upper or lower -transfused 2 units on admission-Hb stable since then -Hb 4/10-9.4  Active Problems: Presumed Recurrent GI bleed -GI consulted on admission, for EGD/Colonoscopy 4/10 -Recent History of Bleeding gastric polyp while on coumadin-this has now been discontinued    Diabetes mellitus  -no further episodes of hypoglycemia -c/w SSI  UTI -urine culture positive for gram neg rods -c/w empiric Rocephin  HTN -BP currently controlled without any anti-HTNsives -on Metoprolol as outpatient    Hypothyroidism -c/w Levothyroxine  Relatively new BKA sites  -Appreciate wound care consult  -Right stump partial thickness wound with slight yellow drainage.  -Left stump with dry eschar to inner area 2x2 cm.    Protein-calorie malnutrition, moderate -on supplements  Disposition: Remain inpatient  DVT Prophylaxis: -none-GI bleed patient  Code Status: Full code   Procedures:  None  CONSULTS:  GI  PHYSICAL EXAM: Vital signs in last 24 hours: Filed Vitals:   12/11/12 1431 12/11/12 2112 12/12/12 0539 12/12/12 0600  BP: 108/46 129/64  135/71  Pulse: 76 82  78  Temp: 98.1 F (36.7 C) 97.8 F (36.6 C)  97.7 F (36.5 C)  TempSrc: Oral Oral  Oral  Resp: 18 18  18   Height:      Weight:   61.4 kg (135 lb 5.8 oz)   SpO2: 100% 94%  93%    Weight change: 5.3 kg (11 lb 11 oz) Body mass index is 26.44 kg/(m^2).   Gen Exam: Awake and alert with clear speech.   Neck: Supple, No JVD.   Chest: B/L Clear.   CVS: S1 S2 Regular, no murmurs.  Abdomen: soft, BS +, non tender, non distended.  Extremities: B/L BKA Neurologic: Non Focal.   Skin: No Rash.    Wounds: N/A.   Intake/Output from previous day:  Intake/Output Summary (Last 24 hours) at 12/12/12 1152 Last data filed at 12/12/12 0500  Gross per 24 hour  Intake   2185 ml  Output    600 ml  Net   1585 ml     LAB RESULTS: CBC  Recent Labs Lab 12/10/12 1502 12/11/12 0330 12/12/12 0500  WBC 19.8* 15.3* 17.9*  HGB 6.0* 8.6* 9.4*  HCT 18.7* 25.5* 27.9*  PLT 638* 438* 525*  MCV 85.0 83.6 84.5  MCH 27.3 28.2 28.5  MCHC 32.1 33.7 33.7  RDW 18.5* 17.1* 17.3*  LYMPHSABS 3.2  --   --   MONOABS 1.3*  --   --   EOSABS 0.7  --   --   BASOSABS 0.0  --   --     Chemistries   Recent Labs Lab 12/10/12 1502 12/11/12 0330 12/12/12 0500  NA 132* 136 133*  K 3.9 4.1 4.9  CL 94* 98 99  CO2 32 28 26  GLUCOSE 36* 114* 265*  BUN 15 14 11   CREATININE 0.69 0.63 0.55  CALCIUM 8.5 8.3* 8.2*    CBG:  Recent Labs Lab 12/11/12 1649 12/11/12 2027 12/11/12 2353 12/12/12 0411 12/12/12 0748  GLUCAP 139* 110* 142* 212* 293*    GFR Estimated Creatinine Clearance: 55 ml/min (by C-G formula based on Cr of 0.55).  Coagulation profile  Recent Labs Lab 12/10/12 1502  INR 1.22  Cardiac Enzymes No results found for this basename: CK, CKMB, TROPONINI, MYOGLOBIN,  in the last 168 hours  No components found with this basename: POCBNP,  No results found for this basename: DDIMER,  in the last 72 hours  Recent Labs  12/10/12 1713 12/11/12 1536  HGBA1C 8.0* 7.1*   No results found for this basename: CHOL, HDL, LDLCALC, TRIG, CHOLHDL, LDLDIRECT,  in the last 72 hours  Recent Labs  12/10/12 1713  TSH 3.938   No results found for this basename: VITAMINB12, FOLATE, FERRITIN, TIBC, IRON, RETICCTPCT,  in the last 72 hours No results found for this basename: LIPASE, AMYLASE,  in the last 72 hours  Urine Studies No results found for this basename: UACOL, UAPR, USPG, UPH, UTP, UGL, UKET, UBIL, UHGB, UNIT, UROB, ULEU, UEPI, UWBC, URBC, UBAC, CAST, CRYS, UCOM, BILUA,  in the  last 72 hours  MICROBIOLOGY: Recent Results (from the past 240 hour(s))  MRSA PCR SCREENING     Status: None   Collection Time    12/10/12 10:17 PM      Result Value Range Status   MRSA by PCR NEGATIVE  NEGATIVE Final   Comment:            The GeneXpert MRSA Assay (FDA     approved for NASAL specimens     only), is one component of a     comprehensive MRSA colonization     surveillance program. It is not     intended to diagnose MRSA     infection nor to guide or     monitor treatment for     MRSA infections.  URINE CULTURE     Status: None   Collection Time    12/10/12 10:52 PM      Result Value Range Status   Specimen Description URINE, CLEAN CATCH   Final   Special Requests NONE   Final   Culture  Setup Time 12/10/2012 23:40   Final   Colony Count >=100,000 COLONIES/ML   Final   Culture GRAM NEGATIVE RODS   Final   Report Status PENDING   Incomplete    RADIOLOGY STUDIES/RESULTS: Dg Chest Port 1 View  12/10/2012  *RADIOLOGY REPORT*  Clinical Data: No bleed.  PORTABLE CHEST - 1 VIEW  Comparison: 09/13/2012  Findings: No infiltrates, edema or pleural effusions.  Subtle area of potential nodularity is seen in the mid right lung.  This may relate to a rib or calcified pleural plaque.  Recommend performing a PA and lateral study in follow-up.  The heart size is stable and within normal limits.  IMPRESSION: No active disease.  Possible subtle nodularity in the right mid lung.  Recommend initial performance of a PA and lateral study in follow-up.   Original Report Authenticated By: Irish Lack, M.D.     MEDICATIONS: Scheduled Meds: . cefTRIAXone (ROCEPHIN)  IV  1 g Intravenous Q24H  . Chlorhexidine Gluconate Cloth  6 each Topical Q0600  . dextrose   Intravenous Once  . feeding supplement  30 mL Oral BID  . feeding supplement  1 Container Oral TID BM  . insulin aspart  0-9 Units Subcutaneous TID WC  . levothyroxine  88 mcg Oral QAC breakfast  . metoCLOPramide  5 mg Oral TID  AC  . mirtazapine  22.5 mg Oral QHS  . mupirocin ointment  1 application Nasal BID  . pantoprazole  40 mg Oral Q0600  . peg 3350 powder  0.5 kit Oral Once   Continuous  Infusions: . dextrose 5 % and 0.9 % NaCl with KCl 20 mEq/L 100 mL/hr at 12/11/12 1647   PRN Meds:.acetaminophen, acetaminophen, albuterol, bisacodyl, morphine injection  Antibiotics: Anti-infectives   Start     Dose/Rate Route Frequency Ordered Stop   12/11/12 1100  cefTRIAXone (ROCEPHIN) 1 g in dextrose 5 % 50 mL IVPB     1 g 100 mL/hr over 30 Minutes Intravenous Every 24 hours 12/11/12 1021         Jeoffrey Massed, MD  [facility] Pager:[phone]  If 7PM-7AM, please contact night-coverage [email or web address] Password [email or web address] 12/12/2012, 11:52 AM   LOS: 2 days

## 2012-12-12 NOTE — Op Note (Signed)
[name] [address] [place], [address]   ENDOSCOPY PROCEDURE REPORT  PATIENT: [name], [name]  MR#: [id number] BIRTHDATE: February 09, 1929 , 84  yrs. old GENDER: Male ENDOSCOPIST: Rachael Fee, MD PROCEDURE DATE:  12/12/2012 PROCEDURE:  EGD, diagnostic ASA CLASS:     Class IV INDICATIONS:  recurrent anemia, FOBT +; had EGD wth banding of hyperplastic gastric polyp Arlyce Dice) that was felt to be causing GI bleeding. MEDICATIONS: There was residual sedation effect present from prior procedure and Versed 1mg  IV TOPICAL ANESTHETIC: none  DESCRIPTION OF PROCEDURE: After the risks benefits and alternatives of the procedure were thoroughly explained, informed consent was obtained.  The Pentax Gastroscope [id number] endoscope was introduced through the mouth and advanced to the second portion of the duodenum. Without limitations.  The instrument was slowly withdrawn as the mucosa was fully examined.   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 The recently banded distal gastric polyp was still present however small compared to previous pictures.  The mucosa was hyperemia but there was no fresh or old blood in stomach.  The examination was otherwise normal.  Retroflexed views revealed no abnormalities. The scope was then withdrawn from the patient and the procedure completed. COMPLICATIONS: There were no complications.  ENDOSCOPIC IMPRESSION: There was very clear fungal infection in esophagus, not biopsied. The recently banded distal gastric polyp was still present however small compared to previous pictures.  The mucosa was hyperemia but there was no fresh or old blood in stomach.  The examination was otherwise normal.  RECOMMENDATIONS: His colonoscopy was incomplete just prior to this examination due to VERY poor prep.  Will reorder, repeat prep tonight and try colonoscopy again tomorrow.  The hyperplastic polyp in stomach  may account for some of the heme + anemia.  I have had success with carafate treating polyps such as this and will start for him.  Will also start diflucan for his manilia esopahgus.   eSigned:  Rachael Fee, MD 12/12/2012 3:16 PM

## 2012-12-12 NOTE — Progress Notes (Addendum)
Inpatient Diabetes Program Recommendations  AACE/ADA: New Consensus Statement on Inpatient Glycemic Control (2013)  Target Ranges:  Prepandial:   less than 140 mg/dL      Peak postprandial:   less than 180 mg/dL (1-2 hours)      Critically ill patients:  140 - 180 mg/dL  Results for [name], [name] (MRN [id number]) as of 12/12/2012 12:25  Ref. Range 12/11/2012 20:27 12/11/2012 23:53 12/12/2012 04:11 12/12/2012 07:48 12/12/2012 11:57  Glucose-Capillary Latest Range: 70-99 mg/dL 409 (H) 811 (H) 914 (H) 293 (H) 343 (H)    Inpatient Diabetes Program Recommendations Insulin - Basal: start low dose basal insulin  CBG at noon 343.  Novolog is being held bc patient is NPO. Thank you  Piedad Climes BSN, RN,CDE Inpatient Diabetes Coordinator [phone] (team pager)

## 2012-12-12 NOTE — Progress Notes (Signed)
Pt is refusing to drink any more of his moviprep and is also refusing to drink anything to keep his blood sugar from dropping.

## 2012-12-12 NOTE — Op Note (Signed)
[name] [address] [place], [address]   COLONOSCOPY PROCEDURE REPORT  PATIENT: [name], [name]  MR#: [id number] BIRTHDATE: [birth year] , 84  yrs. old GENDER: Male ENDOSCOPIST: Rachael Fee, MD PROCEDURE DATE:  12/12/2012 PROCEDURE:   Colonoscopy, diagnostic ; incomplete examination due to VERY POOR PREP ASA CLASS:   Class IV INDICATIONS:heme + anemia, no record of previous colonsocopy. MEDICATIONS: Fentanyl 25 mcg IV and Versed 2 mg IV  DESCRIPTION OF PROCEDURE:   After the risks benefits and alternatives of the procedure were thoroughly explained, informed consent was obtained.  A digital rectal exam revealed no abnormalities of the rectum.   The Pentax Ped Colon [id number] endoscope was introduced through the anus and advanced to the rectum. No adverse events experienced.   The quality of the prep was poor.  The instrument was then slowly withdrawn as the colon was fully examined.   COLON FINDINGS: There was solid stool in rectum and sigmoid. Retroflexion was not performed. The time to cecum NA .  Withdrawal time= NA .  The scope was withdrawn and the procedure completed. COMPLICATIONS: There were no complications.  ENDOSCOPIC IMPRESSION: There was solid stool in rectum and sigmoid. VERY POOLY PREPPED, EXAMINATION WAS INCOMPLETE  RECOMMENDATIONS: Will proceed with EGD now and if needed will order repeat prep for tomorrow.  Will need verification that he is actually taking the prep, supervision during the prep.  It does not appear that he drank any of the prep.   eSigned:  Rachael Fee, MD 12/12/2012 3:04 PM

## 2012-12-12 NOTE — H&P (View-Only) (Signed)
[facility] Consult: 9:06 AM 12/11/2012   Referring Provider: Elliot Cousin Primary Care Physician:  [name], MD Primary Gastroenterologist:  Dr. Russella Dar  Reason for Consultation:  [name]   HPI: [name] is a 79 y.o. male.  Hx IDDM, peripheral vascular disease-status post bilateral BKA with the left BKA on 10/23/12, coronary artery disease, and chronic systolic congestive heart failure with an ejection fraction of 40-45%.  Hx esophageal candidiasis 2013, Hyperplastic gastric 2009, 2013.  H Pylori treated in 2009. Started coumadin in Jan 2014 after left BKA. Transfused with blood then for post op anemia.  Underwent EGD for eval of hemetemesis (INR 3.5) by Dr Arlyce Dice on 11/06/12. Gastric polyp was actively bleeding and band ligated.  ? Of gastric/esophageal AVM.  Required 2 units of PRBCs.  He had been taking Omeprazole at SNF.  Discharged on daily Iron, Omeprazole 20 mg BID as well as Protonix 40 mg daily.   Warfarin was discontinued after 11/06/12 admission.  Has ROV with Dr Russella Dar on 12/16/12.   Now admitted with recurrent anemia, Hgb of 5.4 at SNF. INR is 1.2.  No melena, hematochezia but FOB is positive.  No nausea, no emesis reported.  Does have poor  Appetite. On daily ASA but denies other NSAIDs.  S/p transfusion with 2 units PRBCs with follow up Hgb of 8.6 TSH is normal at 3.9.      Past Medical History  Diagnosis Date  . Thyroid disease   . Anemia   . Hyperlipidemia   . Neuropathy, diabetic   . Diabetic retinopathy   . GERD (gastroesophageal reflux disease)   . Arthritis   . Urgency of urination   . Hypothyroidism   . Sacral wound     hx of  . Cancer   . Diabetic retinopathy   . Peripheral vascular disease     s/p BLE amputations.  . Diabetes mellitus     sees Dr. Chilton Si [facility])  . Chronic systolic heart failure 04/2012    EF 40-45%  . NSTEMI (non-ST elevated myocardial infarction) 04/2012  . Acute  respiratory failure 04/2012    VDRF.  Marland Kitchen Prostate cancer   . Hypertension   . Gastric polyp 11/2012    Source of upper GI bleed. Status post band ligation.  . 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    Past Surgical History  Procedure Laterality Date  . Cataract extraction, bilateral    . Colonoscopy w/ polypectomy    . Esophagogastroduodenoscopy  04/25/2012    Procedure: ESOPHAGOGASTRODUODENOSCOPY (EGD);  Surgeon: Meryl Dare, MD,FACG;  Location: [facility];  Service: Endoscopy;  Laterality: N/A;  . Amputation  05/16/2012    Procedure: AMPUTATION BELOW KNEE;  Surgeon: Nadara Mustard, MD;  Location: [facility] OR;  Service: Orthopedics;  Laterality: Right;  Right Below Knee Amputation  . Tonsillectomy    . Foot amputation      right bka  . Eye surgery      bilateral  . Amputation  09/14/2012    Procedure: AMPUTATION BELOW KNEE;  Surgeon: Nadara Mustard, MD;  Location: [facility] OR;  Service: Orthopedics;  Laterality: Left;  . Amputation Left 10/23/2012    Procedure: Left below knee amputation revision;  Surgeon: Nadara Mustard, MD;  Location: [facility] OR;  Service: Orthopedics;  Laterality: Left;  left below knee amputation revision  . Esophagogastroduodenoscopy N/A 11/07/2012    Procedure: ESOPHAGOGASTRODUODENOSCOPY (EGD);  Surgeon: Louis Meckel, MD;  Location: [facility] ENDOSCOPY;  Service: Endoscopy;  Laterality: N/A;    Prior  to Admission medications   Medication Sig Start Date End Date Taking? Authorizing Provider  albuterol (PROVENTIL HFA;VENTOLIN HFA) 108 (90 BASE) MCG/ACT inhaler Inhale 2 puffs into the lungs every 6 (six) hours as needed for wheezing.   Yes Historical Provider, MD  aspirin 81 MG chewable tablet Chew 1 tablet (81 mg total) by mouth daily. 04/26/12 04/26/13 Yes William S Minor, NP  docusate sodium (COLACE) 100 MG capsule Take 100 mg by mouth 2 (two) times daily.   Yes Historical Provider, MD  feeding supplement (PRO-STAT SUGAR FREE 64) LIQD Take 30 mLs by mouth 2 (two) times daily.   Yes Historical Provider,  MD  ferrous sulfate 325 (65 FE) MG tablet Take 325 mg by mouth daily with breakfast.   Yes Historical Provider, MD  insulin glargine (LANTUS) 100 UNIT/ML injection Inject 15 Units into the skin at bedtime.    Yes Historical Provider, MD  insulin NPH-insulin regular (HUMULIN 70/30) (70-30) 100 UNIT/ML injection Inject 25 Units into the skin daily with breakfast.    Yes Historical Provider, MD  ipratropium (ATROVENT) 0.02 % nebulizer solution Take 500 mcg by nebulization every 2 (two) hours as needed (shortness of breath).   Yes Historical Provider, MD  levothyroxine (SYNTHROID, LEVOTHROID) 88 MCG tablet Take 88 mcg by mouth daily.   Yes Historical Provider, MD  metoCLOPramide (REGLAN) 5 MG tablet Take 5 mg by mouth 3 (three) times daily before meals.   Yes Historical Provider, MD  metoprolol tartrate (LOPRESSOR) 25 MG tablet Take 12.5 mg by mouth 2 (two) times daily.   Yes Historical Provider, MD  mirtazapine (REMERON) 15 MG tablet Take 22.5 mg by mouth at bedtime.   Yes Historical Provider, MD  omeprazole (PRILOSEC) 20 MG capsule Take 20 mg by mouth 2 (two) times daily.   Yes Historical Provider, MD  oxyCODONE-acetaminophen (ROXICET) 5-325 MG per tablet Take 1 tablet by mouth every 4 (four) hours as needed for pain. 10/25/12  Yes Nadara Mustard, MD  pantoprazole sodium (PROTONIX) 40 mg/20 mL PACK Take 40 mg by mouth daily.   Yes Historical Provider, MD  promethazine (PHENERGAN) 25 MG tablet Take 25 mg by mouth every 6 (six) hours as needed for nausea.   Yes Historical Provider, MD    Scheduled Meds: . Chlorhexidine Gluconate Cloth  6 each Topical Q0600  . dextrose   Intravenous Once  . feeding supplement  30 mL Oral BID  . levothyroxine  88 mcg Oral QAC breakfast  . metoCLOPramide  5 mg Oral TID AC  . mirtazapine  22.5 mg Oral QHS  . mupirocin ointment  1 application Nasal BID  . pantoprazole (PROTONIX) IV  40 mg Intravenous Q12H   Infusions: . dextrose 5 % and 0.9 % NaCl with KCl 20 mEq/L  100 mL/hr at 12/11/12 0525   PRN Meds: acetaminophen, acetaminophen, albuterol, morphine injection   Allergies as of 12/10/2012 - Review Complete 12/10/2012  Allergen Reaction Noted  . Bactrim (sulfamethoxazole w-trimethoprim) Swelling 05/26/2012  . Doxycycline Swelling 05/26/2012    Family History  Problem Relation Age of Onset  . Hypertension      History   Social History  . Marital Status: Married    Spouse Name: N/A    Number of Children: N/A  . Years of Education: N/A   Occupational History  . 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 Smoking status: Former Smoker -- 0.25 packs/day    Types: Cigarettes    Quit  date: 11/22/2006  . Smokeless tobacco: Never Used  . Alcohol Use: 0.0 oz/week    1-2 Cans of beer per week     Comment: drinks with meals  . Drug Use: No  . Sexually Active: No   Other Topics Concern  . Not on file   Social History Narrative  . No narrative on file    REVIEW OF SYSTEMS: Feels ok.  Occasional phantom pain in legs No bleeding, just clear oozing from the left BKA site.  No nose blees No dysuria or hematuria.  No swelling of limbs.  No headache or dizziness.   No dyspnea.  No chest pain or palpitaitons.    PHYSICAL EXAM: Vital signs in last 24 hours: Temp:  [97.4 F (36.3 C)-98.8 F (37.1 C)] 97.4 F (36.3 C) (04/09 0429) Pulse Rate:  [73-82] 73 (04/09 0429) Resp:  [7-23] 14 (04/09 0429) BP: (109-145)/(41-70) 123/59 mmHg (04/09 0429) SpO2:  [95 %-100 %] 100 % (04/08 1946) Weight:  [56.1 kg (123 lb 10.9 oz)-56.4 kg (124 lb 5.4 oz)] 56.4 kg (124 lb 5.4 oz) (04/09 0429)  General: pleasant, looks well Head:  No swelling  Eyes:  Conjunctiva pale Ears:  Not HOH  Nose:  No discharge Mouth:  Pink, slightly dry and clear oral MM Neck:  No JVD or masses Lungs:  Clear.  No dyspnea or cough Heart: RRR.  No MRG Abdomen:  Soft, not tender, shelf like edge in left upper quadrant ? Spleen tip/? Mass/?column of  stool.   Rectal: brown stool is FOB trace positive.   Musc/Skeltl:  GU:  No scrotal edema Extremities:  s/p BKA, no blood on left bandage.  Neurologic:  Oriented x 3.  No tremor Skin:  No telangectasia, no rash Tattoos:  none Nodes:  No inguinal adenopathy   Psych:  Pleasant, not agitated, affect somewhat blunted but fully conversant and engaged.   Intake/Output from previous day: 04/08 0701 - 04/09 0700 In: 325 [Blood:325] Out: 550 [Urine:550] Intake/Output this shift:    LAB RESULTS:  Recent Labs  12/10/12 1502 12/11/12 0330  WBC 19.8* 15.3*  HGB 6.0* 8.6*  HCT 18.7* 25.5*  PLT 638* 438*   BMET Lab Results  Component Value Date   NA 136 12/11/2012   NA 132* 12/10/2012   NA 139 11/07/2012   K 4.1 12/11/2012   K 3.9 12/10/2012   K 3.8 11/07/2012   CL 98 12/11/2012   CL 94* 12/10/2012   CL 106 11/07/2012   CO2 28 12/11/2012   CO2 32 12/10/2012   CO2 28 11/07/2012   GLUCOSE 114* 12/11/2012   GLUCOSE 36* 12/10/2012   GLUCOSE 239* 11/07/2012   BUN 14 12/11/2012   BUN 15 12/10/2012   BUN 26* 11/07/2012   CREATININE 0.63 12/11/2012   CREATININE 0.69 12/10/2012   CREATININE 0.70 11/07/2012   CALCIUM 8.3* 12/11/2012   CALCIUM 8.5 12/10/2012   CALCIUM 7.9* 11/07/2012   LFT  Recent Labs  12/11/12 0330  PROT 7.0  ALBUMIN 1.3*  AST 12  ALT <5  ALKPHOS 107  BILITOT 0.2*   PT/INR Lab Results  Component Value Date   INR 1.22 12/10/2012   INR 1.22 11/06/2012   INR 3.52* 11/06/2012   Hepatitis Panel No results found for this basename: HEPBSAG, HCVAB, HEPAIGM, HEPBIGM,  in the last 72 hours C-Diff No components found with this basename: cdiff    Drugs of Abuse     Component Value Date/Time   LABOPIA NONE DETECTED 04/21/2011  0143   COCAINSCRNUR NONE DETECTED 04/21/2011 0143   LABBENZ NONE DETECTED 04/21/2011 0143   AMPHETMU NONE DETECTED 04/21/2011 0143   THCU NONE DETECTED 04/21/2011 0143   LABBARB NONE DETECTED 04/21/2011 0143     RADIOLOGY STUDIES: Dg Chest Port 1 View  12/10/2012  *RADIOLOGY  REPORT*  Clinical Data: No bleed.  PORTABLE CHEST - 1 VIEW  Comparison: 09/13/2012  Findings: No infiltrates, edema or pleural effusions.  Subtle area of potential nodularity is seen in the mid right lung.  This may relate to a rib or calcified pleural plaque.  Recommend performing a PA and lateral study in follow-up.  The heart size is stable and within normal limits.  IMPRESSION: No active disease.  Possible subtle nodularity in the right mid lung.  Recommend initial performance of a PA and lateral study in follow-up.   Original Report Authenticated By: Irish Lack, M.D.     ENDOSCOPIC STUDIES: 11/07/2012   EGD for hemetemesis ENDOSCOPIC IMPRESSION:  Bleeding gastric polyp-status post band ligation  Possible single gastric and esophageal AVM  RECOMMENDATIONS:  1. Continue PPI therapy  2. followup endoscopy in approximately 6 weeks  04/2012  EGD for dysphagia ENDOSCOPIC IMPRESSION:  1. White esophageal exudates consistent with candidiasis; multiple  biopsies  2. Pedunculated polyp measuring 1.5 cm in size in the gastric  antrum; multiple biopsies  3. Atrophic gastritis  4. Duodenitis  RECOMMENDATIONS:  1. Await pathology results  2. Diflucan for 7-10 days  2011  EGD Findings  Normal: Proximal Esophagus to Distal Esophagus. Comments: tortuous  esophagus.  ANGIODYSPLASIA (AVMs): Total of 3 AVMs, maximum size 4 mm, in Fundus.  ICD9: Angiodysplasia of Stomach & Duodenum: 537.83.  Normal: Body.  POLYP: in Antrum. Maximum size: 20 mm. pedunculated polyp. Procedure:  snare with cautery, removed, Polyp retrieved, sent to pathology.  ICD9: Neoplasia, Benign, Stomach: 211.1. Comments: on lesser  curvature of antrum, 12 o'clock position.  Normal: Pyloric Sphincter to Duodenal 2nd Portion.     IMPRESSION: *  Recurrent transfusion requiring anemia with heme + stool but no melena, BPR or hemetemesis *  hemetemesis and anemia fist week of 11/2012.  EGD finding of gastric polyp that was  banded and question of gastric and esophageal AVM.  Hx of hyperplastic gastric polyps.  Pt has been on redundant doses of PPI. S/p 2 units PRBCs *  S/p left BKA 10/2012  *  UTI.  On Rocephin.    PLAN: *  Colonoscopy +/- EGD tomorrow. Pt agreeable. Split dose Movi prep ordered.  *  Once daily Protonix.    LOS: 1 day   Jennye Moccasin  12/11/2012, 9:06 AM Pager: [phone]     ________________________________________________________________________  [name] MD note:  I personally examined the patient, reviewed the data and agree with the assessment and plan described above. Reviewed recent EGD.  Does not appear to have had colonoscopy in at least many years. Planning on colonoscopy +/- EGD tomorrow.   Rob Bunting, MD [facility] Pager [phone]

## 2012-12-12 NOTE — Interval H&P Note (Signed)
History and Physical Interval Note:  12/12/2012 2:31 PM  [name]  has presented today for surgery, with the diagnosis of anemia.  stool is FOB +  The various methods of treatment have been discussed with the patient and family. After consideration of risks, benefits and other options for treatment, the patient has consented to  Procedure(s): COLONOSCOPY (N/A) ESOPHAGOGASTRODUODENOSCOPY (EGD) (N/A) as a surgical intervention .  [name] history has been reviewed, patient examined, no change in status, stable for surgery.  I have reviewed the patient's chart and labs.  Questions were answered to the patient's satisfaction.     Rob Bunting

## 2012-12-13 ENCOUNTER — Encounter (HOSPITAL_COMMUNITY)
Admission: EM | Disposition: A | Discharge status: Skilled Nursing Facility | Payer: Self-pay | Source: Home / Self Care | Attending: Internal Medicine

## 2012-12-13 ENCOUNTER — Encounter (HOSPITAL_COMMUNITY): Payer: Self-pay | Admitting: *Deleted

## 2012-12-13 DIAGNOSIS — B3781 Candidal esophagitis: Secondary | ICD-10-CM | POA: Diagnosis present

## 2012-12-13 HISTORY — PX: COLONOSCOPY: SHX5424

## 2012-12-13 LAB — GLUCOSE, CAPILLARY
Glucose-Capillary: 217 mg/dL — ABNORMAL HIGH (ref 70–99)
Glucose-Capillary: 219 mg/dL — ABNORMAL HIGH (ref 70–99)
Glucose-Capillary: 224 mg/dL — ABNORMAL HIGH (ref 70–99)

## 2012-12-13 LAB — URINALYSIS, ROUTINE W REFLEX MICROSCOPIC
Glucose, UA: 250 mg/dL — AB
Protein, ur: NEGATIVE mg/dL

## 2012-12-13 LAB — URINE MICROSCOPIC-ADD ON

## 2012-12-13 SURGERY — COLONOSCOPY
Anesthesia: Moderate Sedation

## 2012-12-13 MED ORDER — LEVOFLOXACIN 750 MG PO TABS: 750.0000 mg | ORAL_TABLET | Freq: Every day | ORAL | Status: DC

## 2012-12-13 MED ORDER — INSULIN GLARGINE 100 UNIT/ML ~~LOC~~ SOLN: 8.0000 [IU] | Freq: Every day | SUBCUTANEOUS | Status: AC

## 2012-12-13 MED ORDER — OXYCODONE-ACETAMINOPHEN 5-325 MG PO TABS: 1.0000 | ORAL_TABLET | ORAL | Status: DC | PRN

## 2012-12-13 MED ORDER — ONDANSETRON HCL 4 MG/2ML IJ SOLN: 4.0000 mg | Freq: Four times a day (QID) | INTRAMUSCULAR | Status: DC | PRN

## 2012-12-13 MED ORDER — ONDANSETRON HCL 4 MG/2ML IJ SOLN
4.0000 mg | Freq: Once | INTRAMUSCULAR | Status: AC
  Administered 2012-12-13: 4 mg via INTRAVENOUS
  Filled 2012-12-13: qty 2

## 2012-12-13 MED ORDER — INSULIN ASPART 100 UNIT/ML ~~LOC~~ SOLN: 0.0000 [IU] | Freq: Three times a day (TID) | SUBCUTANEOUS | Status: DC

## 2012-12-13 MED ORDER — MIDAZOLAM HCL 5 MG/ML IJ SOLN
INTRAMUSCULAR | Status: AC
  Filled 2012-12-13: qty 2

## 2012-12-13 MED ORDER — SODIUM CHLORIDE 0.9 % IV SOLN
INTRAVENOUS | Status: DC
  Administered 2012-12-13: 09:00:00 via INTRAVENOUS

## 2012-12-13 MED ORDER — MIDAZOLAM HCL 5 MG/5ML IJ SOLN
INTRAMUSCULAR | Status: DC | PRN
  Administered 2012-12-13: 2 mg via INTRAVENOUS

## 2012-12-13 MED ORDER — FENTANYL CITRATE 0.05 MG/ML IJ SOLN
INTRAMUSCULAR | Status: AC
  Filled 2012-12-13: qty 2

## 2012-12-13 MED ORDER — FENTANYL CITRATE 0.05 MG/ML IJ SOLN
INTRAMUSCULAR | Status: DC | PRN
  Administered 2012-12-13: 25 ug via INTRAVENOUS

## 2012-12-13 MED ORDER — FLUCONAZOLE 100 MG PO TABS: 100.0000 mg | ORAL_TABLET | Freq: Every day | ORAL | Status: AC

## 2012-12-13 MED ORDER — DIPHENHYDRAMINE HCL 50 MG/ML IJ SOLN
INTRAMUSCULAR | Status: AC
  Filled 2012-12-13: qty 1

## 2012-12-13 NOTE — Interval H&P Note (Signed)
History and Physical Interval Note:  12/13/2012 11:59 AM  [name]  has presented today for surgery, with the diagnosis of anemia  The various methods of treatment have been discussed with the patient and family. After consideration of risks, benefits and other options for treatment, the patient has consented to  Procedure(s): COLONOSCOPY (N/A) as a surgical intervention .  [name] history has been reviewed, patient examined, no change in status, stable for surgery.  I have reviewed the patient's chart and labs.  Questions were answered to the patient's satisfaction.     Rob Bunting

## 2012-12-13 NOTE — Progress Notes (Signed)
Pt d/c back to [facility].  Report called to RN at [facility].  Skin intact.  Belongings in bag, given to EMS crew.  Pt under no distress.

## 2012-12-13 NOTE — Progress Notes (Signed)
He received fentanyl and 2mg  versed and while positioning him for colonoscopy it was clear that he was still having solid, formed stools. He did not prep two nights ago, colonoscopy cancelled but I proceeded with EGD (see report in chart).  He declined prep last night and although floor staff valiantly tried to get him ready for colonoscopy today however he still has solid, formed stool.   He is not having overt GI bleeding, Gb bumped nicely with initial transfusion.  If he or family still interested in colonoscopy it can be done early next week as in patient or electively as an outpatient.  If they opt for in patient colonoscopy, please keep him on clears throughout the weekend and order full MoviPrep in divided doses, starting Sunday night.  This prep will need to be directly observed by staff to ensure that he completes both the Sunday PM dose and the Monday early AM dose.  Dr. Elnoria Howard is covering [facility] this weekend.  Please call him with any questions or concerns.

## 2012-12-13 NOTE — Discharge Summary (Signed)
Physician Discharge Summary  [name] ZOX:[id number] DOB: [birth year] DOA: 12/10/2012  PCP: [name], MD  Admit date: 12/10/2012 Discharge date: 12/13/2012  Time spent: 50 minutes  Recommendations for Outpatient Follow-up:  1. Patient will be on 10 days of fluconazole for candida esophagitis 2. Please monitor hgb, no source of ABLA was found 3. Insulin dosages have been reduced.  Patient is a brittle diabetic.  Please do not give insulin for cbg less than 140 4. Patient has UTI and received 3 doses of rocephin in house.  He will be discharged on Levaquin for 4 more doses.  Final culture sensitivites should be reviewed. 5. Monitor BP.  Patient's BP was within normal limits on no BP meds inpatient.  He was discharged on his prior to admission medications including metoprolol  Discharge Diagnoses:  Principal Problem:   Acute blood loss anemia Active Problems:   Diabetes mellitus   Hypothyroidism   Protein-calorie malnutrition, moderate   Gastric polyp   Gastric AVM   GI bleed   Hypoglycemia associated with diabetes   Hyponatremia   Candida esophagitis   Discharge Condition: stable, weak  Diet recommendation: carb modified  Filed Weights   12/11/12 0429 12/12/12 0539 12/13/12 0500  Weight: 56.4 kg (124 lb 5.4 oz) 61.4 kg (135 lb 5.8 oz) 62.1 kg (136 lb 14.5 oz)    History of present illness:  [name] is a 79 y.o. male with a history significant for diabetes mellitus, peripheral vascular disease-status post bilateral BKA, coronary artery disease, and chronic systolic congestive heart failure with an ejection fraction of 40-45%. He was recently hospitalized from 11/06/2012 through 11/08/2012 for upper GI bleeding, (in the setting of Coumadin anticoagulation for prophylaxis from previous BKA). The EGD performed by gastroenterologist Dr. Arlyce Dice revealed an actively bleeding gastric polyp which was band ligated and questionable gastric/esophageal AVM. He was transfused 2 units  of packed red blood cells during the hospitalization. His hemoglobin was 5.2 on admission and 8.1 at the time of discharge. Apparently, at the skilled nursing facility, routine blood work was ordered. It revealed a low hemoglobin of 5.4. When questioned, the patient denies bright red blood per rectum or black tarry stools. He denies vomiting or hematemesis. He has had nausea. He denies abdominal pain or abdominal cramping. He denies subjective fever or chills. He denies chest congestion or pain with urination. He does not believe he was given ibuprofen or Aleve, but he believes he takes an aspirin once daily.  In the emergency department, he is afebrile and hemodynamically stable, although his blood pressures on the lower end of normal. His lab data are significant for hemoglobin of 6.0, platelet count of 638, white blood cell count of 19.8, normal PT, slightly elevated PTT of 46 and sodium of 132. His venous glucose is 36. He is being admitted for further evaluation and management.   Hospital Course:   Acute blood loss anemia  Secondary to GI bleed.  Mr. Stmarie had a recent history of bleeding gastric polyp on coumadin.  The coumadin had since been discontinued.  The patient was transfused 2 units on admission.  His hemoglobin rose appropriately and has remained stable.  He underwent EGD without finding an obvious source of bleeding.  Colonoscopy was attempted twice but the patient was unable to consume the prep appropriately.  Consequently attempts at colonoscopy were not successful. Dr. Christella Hartigan offered a third attempt at colonoscopy, but the patient is no longer bleeding and desires discharge to home. We discussed with patient's  daughter, who understood this a difficult situation and agree that colonoscopy could be pursued in the outpatient setting, as he no longer is having any active bleeding. Hemoglobin remained stable after transfusion  Diabetes mellitus  The patient was severely hypoglycemic on  admission.  He was treated with D5 IV fluids and the hypoglycemia resolved.  His insulin regimen was adjusted.  Lantus was reduced to 8 units QHS.  Humalog lispro was discontinued.  Novolog sliding scale sensitive has been started.  We recommend that due to how brittle he is - he should not receive novolog insulin if his CBG is less than 140.  UTI  Urine culture positive for gram neg rods.  He received 3 days of Rocephin in house.  He will receive 4 doses of oral levaquin outpatient for a total of 7 days of treatment.  HTN  BP currently controlled without any antihypertensive medication.  Will resume  Metoprolol as outpatient.  Hypothyroidism  Continue with Levothyroxine   Relatively new BKA sites  Patient received wound care consult.  Right stump with partial thickness wound with slight yellow drainage.  Left stump with dry eschar to inner area 2x2 cm.   Wound care nurse recommended Foam dressings to protect and promote healing.   Protein-calorie malnutrition, moderate  On supplements   Procedures: EGD 12/12/12 - Esophageal candidiasis was found.  The previously bleeding gastric polyp was hyperemic but without stigmata of hemorrhage.  Attempted but unsuccessful colonoscopy 4/10, 4/11  Consultations: [facility]  Discharge Exam: Filed Vitals:   12/13/12 1231 12/13/12 1234 12/13/12 1244 12/13/12 1314  BP: 135/69 140/63 137/58 130/63  Pulse:    74  Temp:    97.5 F (36.4 C)  TempSrc:    Oral  Resp: 18 14 17 20   Height:      Weight:      SpO2: 98% 99% 100% 96%   Gen Exam: Awake and alert with clear speech.  Neck: Supple, No JVD.  Chest: B/L Clear.  CVS: S1 S2 Regular, no murmurs.  Abdomen: soft, BS +, non tender, non distended.  Extremities: B/L BKA  Neurologic: Non Focal.  Skin: No Rash.   Discharge Instructions      Discharge Orders   Future Appointments Provider Department Dept Phone   12/16/2012 10:45 AM Meryl Dare, MD [facility]  [phone]   Future Orders Complete By Expires     Diet - low sodium heart healthy  As directed     Increase activity slowly  As directed         Medication List    STOP taking these medications       HUMULIN 70/30 (70-30) 100 UNIT/ML injection  Generic drug:  insulin NPH-regular      TAKE these medications       albuterol 108 (90 BASE) MCG/ACT inhaler  Commonly known as:  PROVENTIL HFA;VENTOLIN HFA  Inhale 2 puffs into the lungs every 6 (six) hours as needed for wheezing.     aspirin 81 MG chewable tablet  Chew 1 tablet (81 mg total) by mouth daily.     docusate sodium 100 MG capsule  Commonly known as:  COLACE  Take 100 mg by mouth 2 (two) times daily.     feeding supplement Liqd  Take 30 mLs by mouth 2 (two) times daily.     ferrous sulfate 325 (65 FE) MG tablet  Take 325 mg by mouth daily with breakfast.     fluconazole 100 MG tablet  Commonly known as:  DIFLUCAN  Take 1 tablet (100 mg total) by mouth daily.     insulin aspart 100 UNIT/ML injection  Commonly known as:  novoLOG  Inject 0-9 Units into the skin 3 (three) times daily with meals. Do not give insulin if CBG is less than 140.   Maintain in range of 140 - 200     insulin glargine 100 UNIT/ML injection  Commonly known as:  LANTUS  Inject 0.08 mLs (8 Units total) into the skin at bedtime.     ipratropium 0.02 % nebulizer solution  Commonly known as:  ATROVENT  Take 500 mcg by nebulization every 2 (two) hours as needed (shortness of breath).     levofloxacin 750 MG tablet  Commonly known as:  LEVAQUIN  Take 1 tablet (750 mg total) by mouth daily.     levothyroxine 88 MCG tablet  Commonly known as:  SYNTHROID, LEVOTHROID  Take 88 mcg by mouth daily.     metoCLOPramide 5 MG tablet  Commonly known as:  REGLAN  Take 5 mg by mouth 3 (three) times daily before meals.     metoprolol tartrate 25 MG tablet  Commonly known as:  LOPRESSOR  Take 12.5 mg by mouth 2 (two) times daily.     mirtazapine 15  MG tablet  Commonly known as:  REMERON  Take 22.5 mg by mouth at bedtime.     omeprazole 20 MG capsule  Commonly known as:  PRILOSEC  Take 20 mg by mouth 2 (two) times daily.     oxyCODONE-acetaminophen 5-325 MG per tablet  Commonly known as:  ROXICET  Take 1 tablet by mouth every 4 (four) hours as needed for pain.     pantoprazole sodium 40 mg/20 mL Pack  Commonly known as:  PROTONIX  Take 40 mg by mouth daily.     promethazine 25 MG tablet  Commonly known as:  PHENERGAN  Take 25 mg by mouth every 6 (six) hours as needed for nausea.          The results of significant diagnostics from this hospitalization (including imaging, microbiology, ancillary and laboratory) are listed below for reference.    Significant Diagnostic Studies: Dg Chest Port 1 View  12/10/2012  *RADIOLOGY REPORT*  Clinical Data: No bleed.  PORTABLE CHEST - 1 VIEW  Comparison: 09/13/2012  Findings: No infiltrates, edema or pleural effusions.  Subtle area of potential nodularity is seen in the mid right lung.  This may relate to a rib or calcified pleural plaque.  Recommend performing a PA and lateral study in follow-up.  The heart size is stable and within normal limits.  IMPRESSION: No active disease.  Possible subtle nodularity in the right mid lung.  Recommend initial performance of a PA and lateral study in follow-up.   Original Report Authenticated By: Irish Lack, M.D.     Microbiology: Recent Results (from the past 240 hour(s))  MRSA PCR SCREENING     Status: None   Collection Time    12/10/12 10:17 PM      Result Value Range Status   MRSA by PCR NEGATIVE  NEGATIVE Final   Comment:            The GeneXpert MRSA Assay (FDA     approved for NASAL specimens     only), is one component of a     comprehensive MRSA colonization     surveillance program. It is not     intended to diagnose MRSA  infection nor to guide or     monitor treatment for     MRSA infections.  URINE CULTURE     Status:  None   Collection Time    12/10/12 10:52 PM      Result Value Range Status   Specimen Description URINE, CLEAN CATCH   Final   Special Requests NONE   Final   Culture  Setup Time 12/10/2012 23:40   Final   Colony Count >=100,000 COLONIES/ML   Final   Culture GRAM NEGATIVE RODS   Final   Report Status PENDING   Incomplete     Labs: Basic Metabolic Panel:  Recent Labs Lab 12/10/12 1502 12/11/12 0330 12/12/12 0500  NA 132* 136 133*  K 3.9 4.1 4.9  CL 94* 98 99  CO2 32 28 26  GLUCOSE 36* 114* 265*  BUN 15 14 11   CREATININE 0.69 0.63 0.55  CALCIUM 8.5 8.3* 8.2*   Liver Function Tests:  Recent Labs Lab 12/11/12 0330  AST 12  ALT <5  ALKPHOS 107  BILITOT 0.2*  PROT 7.0  ALBUMIN 1.3*   CBC:  Recent Labs Lab 12/10/12 1502 12/11/12 0330 12/12/12 0500  WBC 19.8* 15.3* 17.9*  NEUTROABS 14.7*  --   --   HGB 6.0* 8.6* 9.4*  HCT 18.7* 25.5* 27.9*  MCV 85.0 83.6 84.5  PLT 638* 438* 525*   BNP: BNP (last 3 results)  Recent Labs  04/11/12 0420 04/16/12 1020  PROBNP 3477.0* 2493.0*   CBG:  Recent Labs Lab 12/12/12 2223 12/13/12 0405 12/13/12 0738 12/13/12 1138 12/13/12 1310  GLUCAP 197* 182* 217* 219* 224*    Signed:  Conley Canal [phone]  [facility] 12/13/2012, 3:21 PM  Attending Patient seen and examined, the with the above assessment and plan. Unfortunately a second attempt at colonoscopy was unsuccessful as well, spoke with daughter and patient-they agreed to pursue this in the outpatient setting as patient is no longer actively bleeding. He is stable to be discharged back to SNF.  S Grayton Lobo

## 2012-12-14 LAB — URINE CULTURE: Colony Count: >=100000

## 2012-12-15 LAB — URINE CULTURE

## 2012-12-16 ENCOUNTER — Other Ambulatory Visit: Payer: Self-pay | Admitting: *Deleted

## 2012-12-16 ENCOUNTER — Ambulatory Visit: Payer: Medicare Other | Admitting: Gastroenterology

## 2012-12-16 ENCOUNTER — Encounter (HOSPITAL_COMMUNITY): Payer: Self-pay | Admitting: Gastroenterology

## 2012-12-16 MED ORDER — OXYCODONE-ACETAMINOPHEN 5-325 MG PO TABS: ORAL_TABLET | ORAL | Status: DC

## 2012-12-19 ENCOUNTER — Encounter (HOSPITAL_COMMUNITY)
Admission: RE | Disposition: A | Discharge status: Home / Self Care | Payer: Self-pay | Source: Ambulatory Visit | Attending: Gastroenterology

## 2012-12-19 ENCOUNTER — Encounter (HOSPITAL_COMMUNITY): Payer: Self-pay | Admitting: *Deleted

## 2012-12-19 ENCOUNTER — Non-Acute Institutional Stay (SKILLED_NURSING_FACILITY): Payer: Medicare Other | Admitting: Internal Medicine

## 2012-12-19 ENCOUNTER — Ambulatory Visit (HOSPITAL_COMMUNITY)
Admission: RE | Admit: 2012-12-19 | Discharge: 2012-12-19 | Disposition: A | Discharge status: Home / Self Care | Payer: Medicare Other | Source: Ambulatory Visit | Attending: Gastroenterology | Admitting: Gastroenterology

## 2012-12-19 DIAGNOSIS — N390 Urinary tract infection, site not specified: Secondary | ICD-10-CM

## 2012-12-19 DIAGNOSIS — E1165 Type 2 diabetes mellitus with hyperglycemia: Secondary | ICD-10-CM

## 2012-12-19 DIAGNOSIS — B3781 Candidal esophagitis: Secondary | ICD-10-CM

## 2012-12-19 DIAGNOSIS — Z538 Procedure and treatment not carried out for other reasons: Secondary | ICD-10-CM | POA: Insufficient documentation

## 2012-12-19 DIAGNOSIS — Z89519 Acquired absence of unspecified leg below knee: Secondary | ICD-10-CM

## 2012-12-19 DIAGNOSIS — S88119A Complete traumatic amputation at level between knee and ankle, unspecified lower leg, initial encounter: Secondary | ICD-10-CM

## 2012-12-19 DIAGNOSIS — R1319 Other dysphagia: Secondary | ICD-10-CM | POA: Insufficient documentation

## 2012-12-19 DIAGNOSIS — K922 Gastrointestinal hemorrhage, unspecified: Secondary | ICD-10-CM

## 2012-12-19 LAB — GLUCOSE, CAPILLARY: Glucose-Capillary: 353 mg/dL — ABNORMAL HIGH (ref 70–99)

## 2012-12-19 SURGERY — CANCELLED PROCEDURE
Wound class: Other (see notes)

## 2012-12-19 MED ORDER — SODIUM CHLORIDE 0.9 % IV SOLN
INTRAVENOUS | Status: DC
  Administered 2012-12-19: 500 mL via INTRAVENOUS

## 2012-12-19 MED ORDER — DIPHENHYDRAMINE HCL 50 MG/ML IJ SOLN
INTRAMUSCULAR | Status: AC
  Filled 2012-12-19: qty 1

## 2012-12-19 MED ORDER — FENTANYL CITRATE 0.05 MG/ML IJ SOLN
INTRAMUSCULAR | Status: AC
  Filled 2012-12-19: qty 4

## 2012-12-19 MED ORDER — INSULIN ASPART 100 UNIT/ML ~~LOC~~ SOLN
6.0000 [IU] | Freq: Once | SUBCUTANEOUS | Status: DC
  Filled 2012-12-19: qty 3

## 2012-12-19 MED ORDER — MIDAZOLAM HCL 10 MG/2ML IJ SOLN
INTRAMUSCULAR | Status: AC
  Filled 2012-12-19: qty 4

## 2012-12-19 NOTE — Progress Notes (Signed)
Patient ID: Frances Joynt, male   DOB: [birth year], 79 y.o.   MRN: [id number]   Zacharey Jensen WUJ:[id number] DOB: 21-Nov-1928 DOA: (Not on file)  Patient seen and examined on 12/18/2012  PCP: [name], MD   Chief Complaint: follow up post hospitalization.  HPI:  Mr heinlen is an 79 y/o male with hx of DM, HTN, anemia , GERD . PVD s/p b/l BKA , CAD, chronic systolic CHF who was admitted to [facility] from 4/8 -4/11 for anemia. Prior to this he was hospitalized in march for upper GI bleed secondary to anticoagulation on coumadin for recent BKA. He was found to have a bleeding  gastric polyp on EGD which was ligated. Coumadin was discontinued. During this admission he did c/o passing  tarry stools. He was transfused 2 U prbc with improvement in hb. EGD repeated was normal except for esophageal candidiasis. Colonoscopy was attempted twice but failed and patient was unable to be be prepped properly. Given his hb being stable and no further bleeding he was discharged back to SNF with plan on outpt colonoscopy. patient was also quite hypoglycemic on admission requiring a D5 drip. lantus was reduced to 8 units daily. premeal aspart was discontinued and placed on sensitive sliding scale. Patient also had UTI growing pseudomonas and staph sensitive to cipro and discharged on it for a total 7 day course.  On evaluation today he denies any nausea ,   Vomiting, bowel or urinary symptoms.  Review of Systems:  Constitutional: Denies fever, chills, diaphoresis, c/o appetite change and fatigue.  HEENT: Denies photophobia, eye pain, redness, hearing loss, ear pain, congestion, sore throat, rhinorrhea, sneezing, mouth sores, trouble swallowing, neck pain, neck stiffness and tinnitus.   Respiratory: Denies SOB, DOE, cough, chest tightness,  and wheezing.   Cardiovascular: Denies chest pain, palpitations  Gastrointestinal: Denies nausea, vomiting, abdominal pain, diarrhea, constipation, blood in stool and abdominal  distention.  Genitourinary: Denies dysuria, urgency, frequency, hematuria, flank pain and difficulty urinating.  Musculoskeletal: Denies myalgias, back pain, Skin: Denies pallor, rash and wound.  Neurological: Denies dizziness, seizures, syncope, weakness, light-headedness, numbness and headaches.  Hematological: Denies adenopathy. Easy bruising, personal or family bleeding history  Psychiatric/Behavioral: Denies suicidal ideation, mood changes, confusion, nervousness, sleep disturbance and agitation   Past Medical History  Diagnosis Date  . Thyroid disease   . Anemia   . Hyperlipidemia   . Neuropathy, diabetic   . Diabetic retinopathy   . GERD (gastroesophageal reflux disease)   . Arthritis   . Urgency of urination   . Hypothyroidism   . Sacral wound     hx of  . Cancer   . Diabetic retinopathy   . Peripheral vascular disease     s/p BLE amputations.  . Diabetes mellitus     sees Dr. Chilton Si [facility])  . Chronic systolic heart failure 04/2012    EF 40-45%  . NSTEMI (non-ST elevated myocardial infarction) 04/2012  . Acute respiratory failure 04/2012    VDRF.  Marland Kitchen Prostate cancer   . Hypertension   . Gastric polyp 11/2012    Source of upper GI bleed. Status post band ligation.  . 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   Past Surgical History  Procedure Laterality Date  . Cataract extraction, bilateral    . Colonoscopy w/ polypectomy    . Esophagogastroduodenoscopy  04/25/2012    Procedure: ESOPHAGOGASTRODUODENOSCOPY (EGD);  Surgeon: Meryl Dare, MD,FACG;  Location: [facility];  Service: Endoscopy;  Laterality: N/A;  . Amputation  05/16/2012  Procedure: AMPUTATION BELOW KNEE;  Surgeon: Nadara Mustard, MD;  Location: [facility] OR;  Service: Orthopedics;  Laterality: Right;  Right Below Knee Amputation  . Tonsillectomy    . Foot amputation      right bka  . Eye surgery      bilateral  . Amputation  09/14/2012    Procedure: AMPUTATION BELOW KNEE;  Surgeon: Nadara Mustard, MD;  Location: [facility] OR;   Service: Orthopedics;  Laterality: Left;  . Amputation Left 10/23/2012    Procedure: Left below knee amputation revision;  Surgeon: Nadara Mustard, MD;  Location: [facility] OR;  Service: Orthopedics;  Laterality: Left;  left below knee amputation revision  . Esophagogastroduodenoscopy N/A 11/07/2012    Procedure: ESOPHAGOGASTRODUODENOSCOPY (EGD);  Surgeon: Louis Meckel, MD;  Location: [facility] ENDOSCOPY;  Service: Endoscopy;  Laterality: N/A;  . Colonoscopy N/A 12/13/2012    Procedure: COLONOSCOPY;  Surgeon: Rachael Fee, MD;  Location: [facility] ENDOSCOPY;  Service: Endoscopy;  Laterality: N/A;  . Colonoscopy N/A 12/12/2012    Procedure: COLONOSCOPY;  Surgeon: Rachael Fee, MD;  Location: [facility] ENDOSCOPY;  Service: Endoscopy;  Laterality: N/A;  . Esophagogastroduodenoscopy N/A 12/12/2012    Procedure: ESOPHAGOGASTRODUODENOSCOPY (EGD);  Surgeon: Rachael Fee, MD;  Location: [facility] ENDOSCOPY;  Service: Endoscopy;  Laterality: N/A;   Social History:  reports that he quit smoking about 6 years ago. His smoking use included Cigarettes. He smoked 0.25 packs per day. He has never used smokeless tobacco. He reports that  drinks alcohol. He reports that he does not use illicit drugs.  Allergies  Allergen Reactions  . Bactrim (Sulfamethoxazole W-Trimethoprim) Swelling    Lips, eyes, cheeks swollen  . Doxycycline Swelling    Lips, eyes, cheeks were all swollen    Family History  Problem Relation Age of Onset  . Hypertension      Prior to Admission medications   Medication Sig Start Date End Date Taking? Authorizing Provider  albuterol (PROVENTIL HFA;VENTOLIN HFA) 108 (90 BASE) MCG/ACT inhaler Inhale 2 puffs into the lungs every 6 (six) hours as needed for wheezing.    Historical Provider, MD  aspirin 81 MG chewable tablet Chew 1 tablet (81 mg total) by mouth daily. 04/26/12 04/26/13  Vilinda Blanks Minor, NP  docusate sodium (COLACE) 100 MG capsule Take 100 mg by mouth 2 (two) times daily.    Historical Provider, MD   feeding supplement (PRO-STAT SUGAR FREE 64) LIQD Take 30 mLs by mouth 2 (two) times daily.    Historical Provider, MD  ferrous sulfate 325 (65 FE) MG tablet Take 325 mg by mouth daily with breakfast.    Historical Provider, MD  fluconazole (DIFLUCAN) 100 MG tablet Take 1 tablet (100 mg total) by mouth daily. 12/13/12   Tora Kindred York, PA-C  insulin aspart (NOVOLOG) 100 UNIT/ML injection Inject 0-9 Units into the skin 3 (three) times daily with meals. Do not give insulin if CBG is less than 140.   Maintain in range of 140 - 200 12/13/12   Tora Kindred York, PA-C  insulin glargine (LANTUS) 100 UNIT/ML injection Inject 0.08 mLs (8 Units total) into the skin at bedtime. 12/13/12   Tora Kindred York, PA-C  ipratropium (ATROVENT) 0.02 % nebulizer solution Take 500 mcg by nebulization every 2 (two) hours as needed (shortness of breath).    Historical Provider, MD  levofloxacin (LEVAQUIN) 750 MG tablet Take 1 tablet (750 mg total) by mouth daily. 12/13/12   Tora Kindred York, PA-C  levothyroxine (SYNTHROID, LEVOTHROID) 88 MCG  tablet Take 88 mcg by mouth daily.    Historical Provider, MD  metoCLOPramide (REGLAN) 5 MG tablet Take 5 mg by mouth 3 (three) times daily before meals.    Historical Provider, MD  metoprolol tartrate (LOPRESSOR) 25 MG tablet Take 12.5 mg by mouth 2 (two) times daily.    Historical Provider, MD  mirtazapine (REMERON) 15 MG tablet Take 22.5 mg by mouth at bedtime.    Historical Provider, MD  omeprazole (PRILOSEC) 20 MG capsule Take 20 mg by mouth 2 (two) times daily.    Historical Provider, MD  oxyCODONE-acetaminophen (ROXICET) 5-325 MG per tablet Take one tablet every 4 hours as needed 12/16/12   Claudie Revering, NP  pantoprazole sodium (PROTONIX) 40 mg/20 mL PACK Take 40 mg by mouth daily.    Historical Provider, MD  promethazine (PHENERGAN) 25 MG tablet Take 25 mg by mouth every 6 (six) hours as needed for nausea.    Historical Provider, MD    Physical Exam:  Filed Vitals:   12/18/12  1100  BP: 123/59  Pulse: 76  Temp: 97.7 F (36.5 C)  Resp: 18    Constitutional: Vital signs reviewed.  Patient is an elderly male in wheelchair in NAD Head: Normocephalic and atraumatic Mouth: no erythema or exudates, MMM Eyes: PERRL, EOMI, conjunctivae normal, No scleral icterus.  Neck: Supple, Trachea midline normal ROM, No JVD, mass, thyromegaly, or carotid bruit present.  Cardiovascular: RRR, S1 normal, S2 normal, no MRG, pulses symmetric and intact bilaterally Pulmonary/Chest: CTAB, no wheezes, rales, or rhonchi Abdominal: Soft. Non-tender, non-distended, bowel sounds are normal, no masses, organomegaly, or guarding present.  GU: no CVA tenderness Musculoskeletal: B/L BKA with clean stump Hematology: no cervical  adenopathy.  Neurological: A&O x3,  Skin: Warm, dry and intact. No rash, cyanosis, or clubbing.  Psychiatric: Normal mood and affect. speech and behavior is normal. Judgment and thought content normal. Cognition and memory are normal.   Labs on Admission:  Basic Metabolic Panel: No results found for this basename: NA, K, CL, CO2, GLUCOSE, BUN, CREATININE, CALCIUM, MG, PHOS,  in the last 168 hours Liver Function Tests: No results found for this basename: AST, ALT, ALKPHOS, BILITOT, PROT, ALBUMIN,  in the last 168 hours No results found for this basename: LIPASE, AMYLASE,  in the last 168 hours No results found for this basename: AMMONIA,  in the last 168 hours CBC: No results found for this basename: WBC, NEUTROABS, HGB, HCT, MCV, PLT,  in the last 168 hours Cardiac Enzymes: No results found for this basename: CKTOTAL, CKMB, CKMBINDEX, TROPONINI,  in the last 168 hours BNP: No components found with this basename: POCBNP,  CBG:  Recent Labs Lab 12/13/12 0405 12/13/12 0738 12/13/12 1138 12/13/12 1310 12/19/12 1212  GLUCAP 182* 217* 219* 224* 353*    Radiological Exams on Admission: No results found.    Assessment/Plan   GI bleed No further  episodes. Hb has been stable. Ordered for repeat labs Will follow up with [facility] for outpt colonoscopy.  Continue iron supplement and PPI.  Brittle DM lantus dose reduced to 8 units upon discharge. fsg ranging from 150-450. Will increase dose to 12 units. Continue sensitive sliding scale.  UTI cx growing pseudomonas and staph Completes Levaquin on 4/18.  Candida esophagitis  being treated with a 10 day course of fluconazole   B/l BKA Recent r/t BKA with healthy granulation tissue. continue foam dressing.  Hypertension  BP stable. continue metoprolol  Malnutrition continue supplements  hypothyroidism Continue synthroid  Activities of daily living Feeds independently, needs help with dressing, bathing , toileting and ambulation  Rehab potential : fair  Future labs: cbc ordered  Follow up: will be followed as needed in SNF. Has outpt f/up with [facility]  Code Status: DNR  Total time spent: 60 minutes  Jordyne Poehlman  12/19/2012, 12:39 PM

## 2012-12-19 NOTE — H&P (Signed)
  Pt is here in the endoscopy unit scheduled for EGD.  I have reviewed the records.  It appears that he was supposed to have a reattempt at colonoscopy.  I cannot see where repeat EGD was requested.  EGD has been cancelled.  Will discuss case with Dr. Wendall Papa next week when he returns from vacation since he was the physician who last saw the patient.

## 2012-12-28 ENCOUNTER — Encounter (HOSPITAL_COMMUNITY): Payer: Self-pay | Admitting: *Deleted

## 2012-12-28 ENCOUNTER — Inpatient Hospital Stay (HOSPITAL_COMMUNITY)
Admission: EM | Admit: 2012-12-28 | Discharge: 2012-12-30 | DRG: 871 | Disposition: A | Discharge status: Hospice | Payer: Medicare Other | Attending: Internal Medicine | Admitting: Internal Medicine

## 2012-12-28 ENCOUNTER — Emergency Department (HOSPITAL_COMMUNITY): Payer: Medicare Other

## 2012-12-28 DIAGNOSIS — C61 Malignant neoplasm of prostate: Secondary | ICD-10-CM

## 2012-12-28 DIAGNOSIS — Z515 Encounter for palliative care: Secondary | ICD-10-CM

## 2012-12-28 DIAGNOSIS — Z794 Long term (current) use of insulin: Secondary | ICD-10-CM

## 2012-12-28 DIAGNOSIS — E876 Hypokalemia: Secondary | ICD-10-CM

## 2012-12-28 DIAGNOSIS — J189 Pneumonia, unspecified organism: Secondary | ICD-10-CM | POA: Diagnosis present

## 2012-12-28 DIAGNOSIS — E785 Hyperlipidemia, unspecified: Secondary | ICD-10-CM | POA: Diagnosis present

## 2012-12-28 DIAGNOSIS — I214 Non-ST elevation (NSTEMI) myocardial infarction: Secondary | ICD-10-CM

## 2012-12-28 DIAGNOSIS — I5022 Chronic systolic (congestive) heart failure: Secondary | ICD-10-CM | POA: Diagnosis present

## 2012-12-28 DIAGNOSIS — N170 Acute kidney failure with tubular necrosis: Secondary | ICD-10-CM | POA: Diagnosis present

## 2012-12-28 DIAGNOSIS — I10 Essential (primary) hypertension: Secondary | ICD-10-CM | POA: Diagnosis present

## 2012-12-28 DIAGNOSIS — K317 Polyp of stomach and duodenum: Secondary | ICD-10-CM

## 2012-12-28 DIAGNOSIS — I252 Old myocardial infarction: Secondary | ICD-10-CM

## 2012-12-28 DIAGNOSIS — K219 Gastro-esophageal reflux disease without esophagitis: Secondary | ICD-10-CM | POA: Diagnosis present

## 2012-12-28 DIAGNOSIS — E13622 Other specified diabetes mellitus with other skin ulcer: Secondary | ICD-10-CM

## 2012-12-28 DIAGNOSIS — E11319 Type 2 diabetes mellitus with unspecified diabetic retinopathy without macular edema: Secondary | ICD-10-CM | POA: Diagnosis present

## 2012-12-28 DIAGNOSIS — E1139 Type 2 diabetes mellitus with other diabetic ophthalmic complication: Secondary | ICD-10-CM | POA: Diagnosis present

## 2012-12-28 DIAGNOSIS — Z79899 Other long term (current) drug therapy: Secondary | ICD-10-CM

## 2012-12-28 DIAGNOSIS — I509 Heart failure, unspecified: Secondary | ICD-10-CM | POA: Diagnosis present

## 2012-12-28 DIAGNOSIS — K920 Hematemesis: Secondary | ICD-10-CM

## 2012-12-28 DIAGNOSIS — S88119A Complete traumatic amputation at level between knee and ankle, unspecified lower leg, initial encounter: Secondary | ICD-10-CM

## 2012-12-28 DIAGNOSIS — I129 Hypertensive chronic kidney disease with stage 1 through stage 4 chronic kidney disease, or unspecified chronic kidney disease: Secondary | ICD-10-CM | POA: Diagnosis present

## 2012-12-28 DIAGNOSIS — Z8546 Personal history of malignant neoplasm of prostate: Secondary | ICD-10-CM

## 2012-12-28 DIAGNOSIS — R918 Other nonspecific abnormal finding of lung field: Secondary | ICD-10-CM

## 2012-12-28 DIAGNOSIS — J690 Pneumonitis due to inhalation of food and vomit: Secondary | ICD-10-CM | POA: Diagnosis present

## 2012-12-28 DIAGNOSIS — D62 Acute posthemorrhagic anemia: Secondary | ICD-10-CM

## 2012-12-28 DIAGNOSIS — J9600 Acute respiratory failure, unspecified whether with hypoxia or hypercapnia: Secondary | ICD-10-CM | POA: Diagnosis present

## 2012-12-28 DIAGNOSIS — Z7401 Bed confinement status: Secondary | ICD-10-CM

## 2012-12-28 DIAGNOSIS — R6520 Severe sepsis without septic shock: Secondary | ICD-10-CM | POA: Diagnosis present

## 2012-12-28 DIAGNOSIS — A419 Sepsis, unspecified organism: Principal | ICD-10-CM | POA: Diagnosis present

## 2012-12-28 DIAGNOSIS — E871 Hypo-osmolality and hyponatremia: Secondary | ICD-10-CM

## 2012-12-28 DIAGNOSIS — I739 Peripheral vascular disease, unspecified: Secondary | ICD-10-CM | POA: Diagnosis present

## 2012-12-28 DIAGNOSIS — J9601 Acute respiratory failure with hypoxia: Secondary | ICD-10-CM | POA: Diagnosis present

## 2012-12-28 DIAGNOSIS — E11649 Type 2 diabetes mellitus with hypoglycemia without coma: Secondary | ICD-10-CM

## 2012-12-28 DIAGNOSIS — E11621 Type 2 diabetes mellitus with foot ulcer: Secondary | ICD-10-CM

## 2012-12-28 DIAGNOSIS — E870 Hyperosmolality and hypernatremia: Secondary | ICD-10-CM

## 2012-12-28 DIAGNOSIS — R1310 Dysphagia, unspecified: Secondary | ICD-10-CM | POA: Diagnosis present

## 2012-12-28 DIAGNOSIS — B3781 Candidal esophagitis: Secondary | ICD-10-CM

## 2012-12-28 DIAGNOSIS — M129 Arthropathy, unspecified: Secondary | ICD-10-CM | POA: Diagnosis present

## 2012-12-28 DIAGNOSIS — R531 Weakness: Secondary | ICD-10-CM

## 2012-12-28 DIAGNOSIS — E43 Unspecified severe protein-calorie malnutrition: Secondary | ICD-10-CM | POA: Diagnosis present

## 2012-12-28 DIAGNOSIS — I519 Heart disease, unspecified: Secondary | ICD-10-CM

## 2012-12-28 DIAGNOSIS — L89159 Pressure ulcer of sacral region, unspecified stage: Secondary | ICD-10-CM | POA: Diagnosis present

## 2012-12-28 DIAGNOSIS — I2510 Atherosclerotic heart disease of native coronary artery without angina pectoris: Secondary | ICD-10-CM | POA: Diagnosis present

## 2012-12-28 DIAGNOSIS — R5381 Other malaise: Secondary | ICD-10-CM

## 2012-12-28 DIAGNOSIS — L03119 Cellulitis of unspecified part of limb: Secondary | ICD-10-CM

## 2012-12-28 DIAGNOSIS — E1149 Type 2 diabetes mellitus with other diabetic neurological complication: Secondary | ICD-10-CM | POA: Diagnosis present

## 2012-12-28 DIAGNOSIS — E1165 Type 2 diabetes mellitus with hyperglycemia: Secondary | ICD-10-CM

## 2012-12-28 DIAGNOSIS — K31819 Angiodysplasia of stomach and duodenum without bleeding: Secondary | ICD-10-CM

## 2012-12-28 DIAGNOSIS — N179 Acute kidney failure, unspecified: Secondary | ICD-10-CM

## 2012-12-28 DIAGNOSIS — R791 Abnormal coagulation profile: Secondary | ICD-10-CM

## 2012-12-28 DIAGNOSIS — L8992 Pressure ulcer of unspecified site, stage 2: Secondary | ICD-10-CM | POA: Diagnosis present

## 2012-12-28 DIAGNOSIS — L89109 Pressure ulcer of unspecified part of back, unspecified stage: Secondary | ICD-10-CM | POA: Diagnosis present

## 2012-12-28 DIAGNOSIS — L98499 Non-pressure chronic ulcer of skin of other sites with unspecified severity: Secondary | ICD-10-CM

## 2012-12-28 DIAGNOSIS — E440 Moderate protein-calorie malnutrition: Secondary | ICD-10-CM | POA: Diagnosis present

## 2012-12-28 DIAGNOSIS — N189 Chronic kidney disease, unspecified: Secondary | ICD-10-CM | POA: Diagnosis present

## 2012-12-28 DIAGNOSIS — D649 Anemia, unspecified: Secondary | ICD-10-CM | POA: Diagnosis present

## 2012-12-28 DIAGNOSIS — Z87891 Personal history of nicotine dependence: Secondary | ICD-10-CM

## 2012-12-28 DIAGNOSIS — E119 Type 2 diabetes mellitus without complications: Secondary | ICD-10-CM | POA: Diagnosis present

## 2012-12-28 DIAGNOSIS — E039 Hypothyroidism, unspecified: Secondary | ICD-10-CM | POA: Diagnosis present

## 2012-12-28 DIAGNOSIS — K922 Gastrointestinal hemorrhage, unspecified: Secondary | ICD-10-CM

## 2012-12-28 DIAGNOSIS — Z66 Do not resuscitate: Secondary | ICD-10-CM | POA: Diagnosis present

## 2012-12-28 DIAGNOSIS — E1142 Type 2 diabetes mellitus with diabetic polyneuropathy: Secondary | ICD-10-CM | POA: Diagnosis present

## 2012-12-28 DIAGNOSIS — D131 Benign neoplasm of stomach: Secondary | ICD-10-CM

## 2012-12-28 LAB — PROCALCITONIN: Procalcitonin: 0.74 ng/mL

## 2012-12-28 LAB — URINALYSIS, ROUTINE W REFLEX MICROSCOPIC
Nitrite: POSITIVE — AB
Specific Gravity, Urine: 1.024 (ref 1.005–1.030)
Urobilinogen, UA: 0.2 mg/dL (ref 0.0–1.0)
pH: 5 (ref 5.0–8.0)

## 2012-12-28 LAB — CBC WITH DIFFERENTIAL/PLATELET
Basophils Absolute: 0 10*3/uL (ref 0.0–0.1)
Eosinophils Absolute: 0.2 10*3/uL (ref 0.0–0.7)
HCT: 26.6 % — ABNORMAL LOW (ref 39.0–52.0)
Lymphocytes Relative: 14 % (ref 12–46)
MCH: 27.8 pg (ref 26.0–34.0)
MCHC: 31.6 g/dL (ref 30.0–36.0)
Monocytes Absolute: 0.6 10*3/uL (ref 0.1–1.0)
Neutro Abs: 12.4 10*3/uL — ABNORMAL HIGH (ref 1.7–7.7)
RDW: 17.7 % — ABNORMAL HIGH (ref 11.5–15.5)

## 2012-12-28 LAB — POCT I-STAT, CHEM 8
BUN: 17 mg/dL (ref 6–23)
Creatinine, Ser: 1.1 mg/dL (ref 0.50–1.35)
Glucose, Bld: 342 mg/dL — ABNORMAL HIGH (ref 70–99)
Hemoglobin: 9.2 g/dL — ABNORMAL LOW (ref 13.0–17.0)
Potassium: 3.9 mEq/L (ref 3.5–5.1)
Sodium: 139 mEq/L (ref 135–145)
TCO2: 24 mmol/L (ref 0–100)

## 2012-12-28 LAB — COMPREHENSIVE METABOLIC PANEL
ALT: <5 U/L (ref 0–53)
BUN: 18 mg/dL (ref 6–23)
CO2: 24 mEq/L (ref 19–32)
Calcium: 8.9 mg/dL (ref 8.4–10.5)
Creatinine, Ser: 0.84 mg/dL (ref 0.50–1.35)
GFR calc Af Amer: >90 mL/min (ref >90)
GFR calc non Af Amer: 79 mL/min — ABNORMAL LOW (ref >90)
Glucose, Bld: 347 mg/dL — ABNORMAL HIGH (ref 70–99)
Sodium: 138 mEq/L (ref 135–145)
Total Protein: 7.9 g/dL (ref 6.0–8.3)

## 2012-12-28 LAB — HIV ANTIBODY (ROUTINE TESTING W REFLEX): HIV: NONREACTIVE

## 2012-12-28 LAB — URINE MICROSCOPIC-ADD ON

## 2012-12-28 LAB — POCT I-STAT TROPONIN I

## 2012-12-28 LAB — GLUCOSE, CAPILLARY: Glucose-Capillary: 277 mg/dL — ABNORMAL HIGH (ref 70–99)

## 2012-12-28 LAB — APTT: aPTT: 43 seconds — ABNORMAL HIGH (ref 24–37)

## 2012-12-28 LAB — SAMPLE TO BLOOD BANK

## 2012-12-28 LAB — PROTIME-INR: Prothrombin Time: 15.9 seconds — ABNORMAL HIGH (ref 11.6–15.2)

## 2012-12-28 LAB — OCCULT BLOOD, POC DEVICE: Fecal Occult Bld: NEGATIVE

## 2012-12-28 MED ORDER — SODIUM CHLORIDE 0.9 % IV BOLUS (SEPSIS)
500.0000 mL | Freq: Once | INTRAVENOUS | Status: AC
  Administered 2012-12-28: 500 mL via INTRAVENOUS

## 2012-12-28 MED ORDER — IPRATROPIUM BROMIDE 0.02 % IN SOLN
0.5000 mg | RESPIRATORY_TRACT | Status: DC
  Administered 2012-12-28 (×3): 0.5 mg via RESPIRATORY_TRACT
  Filled 2012-12-28 (×3): qty 2.5

## 2012-12-28 MED ORDER — SODIUM CHLORIDE 0.9 % IV SOLN
INTRAVENOUS | Status: AC
  Administered 2012-12-28: 14:00:00 via INTRAVENOUS

## 2012-12-28 MED ORDER — INSULIN GLARGINE 100 UNIT/ML ~~LOC~~ SOLN
8.0000 [IU] | Freq: Every day | SUBCUTANEOUS | Status: DC
  Administered 2012-12-28 – 2012-12-29 (×2): 8 [IU] via SUBCUTANEOUS
  Filled 2012-12-28 (×4): qty 0.08

## 2012-12-28 MED ORDER — FLUCONAZOLE IN SODIUM CHLORIDE 200-0.9 MG/100ML-% IV SOLN
200.0000 mg | Freq: Once | INTRAVENOUS | Status: AC
  Administered 2012-12-28: 200 mg via INTRAVENOUS
  Filled 2012-12-28: qty 100

## 2012-12-28 MED ORDER — PIPERACILLIN-TAZOBACTAM 3.375 G IVPB
3.3750 g | Freq: Three times a day (TID) | INTRAVENOUS | Status: DC
  Administered 2012-12-28 – 2012-12-30 (×6): 3.375 g via INTRAVENOUS
  Filled 2012-12-28 (×9): qty 50

## 2012-12-28 MED ORDER — LEVOFLOXACIN IN D5W 750 MG/150ML IV SOLN: 750.0000 mg | INTRAVENOUS | Status: DC

## 2012-12-28 MED ORDER — LEVOFLOXACIN IN D5W 750 MG/150ML IV SOLN
750.0000 mg | INTRAVENOUS | Status: DC
  Administered 2012-12-28: 750 mg via INTRAVENOUS
  Filled 2012-12-28 (×5): qty 150

## 2012-12-28 MED ORDER — ALBUTEROL SULFATE (5 MG/ML) 0.5% IN NEBU
INHALATION_SOLUTION | RESPIRATORY_TRACT | Status: AC
  Filled 2012-12-28: qty 1

## 2012-12-28 MED ORDER — METOCLOPRAMIDE HCL 5 MG/ML IJ SOLN
5.0000 mg | Freq: Three times a day (TID) | INTRAMUSCULAR | Status: DC
  Administered 2012-12-28 – 2012-12-30 (×5): 5 mg via INTRAVENOUS
  Filled 2012-12-28 (×9): qty 1

## 2012-12-28 MED ORDER — ALBUTEROL SULFATE (5 MG/ML) 0.5% IN NEBU
5.0000 mg | INHALATION_SOLUTION | Freq: Once | RESPIRATORY_TRACT | Status: AC
  Administered 2012-12-28: 5 mg via RESPIRATORY_TRACT

## 2012-12-28 MED ORDER — MIRTAZAPINE 7.5 MG PO TABS
22.5000 mg | ORAL_TABLET | Freq: Every day | ORAL | Status: DC
  Administered 2012-12-28: 22.5 mg via ORAL
  Filled 2012-12-28 (×3): qty 1

## 2012-12-28 MED ORDER — VANCOMYCIN HCL IN DEXTROSE 1-5 GM/200ML-% IV SOLN
1000.0000 mg | INTRAVENOUS | Status: DC
  Administered 2012-12-29: 1000 mg via INTRAVENOUS
  Filled 2012-12-28: qty 200

## 2012-12-28 MED ORDER — ALBUTEROL SULFATE (5 MG/ML) 0.5% IN NEBU
2.5000 mg | INHALATION_SOLUTION | RESPIRATORY_TRACT | Status: DC
  Administered 2012-12-28 (×3): 2.5 mg via RESPIRATORY_TRACT
  Filled 2012-12-28 (×3): qty 0.5

## 2012-12-28 MED ORDER — ASPIRIN 81 MG PO CHEW
81.0000 mg | CHEWABLE_TABLET | Freq: Every day | ORAL | Status: DC
  Administered 2012-12-28 – 2012-12-30 (×2): 81 mg via ORAL
  Filled 2012-12-28 (×3): qty 1

## 2012-12-28 MED ORDER — OXYCODONE-ACETAMINOPHEN 5-325 MG PO TABS: 1.0000 | ORAL_TABLET | Freq: Four times a day (QID) | ORAL | Status: DC | PRN

## 2012-12-28 MED ORDER — RESOURCE THICKENUP CLEAR PO POWD
ORAL | Status: DC | PRN
  Filled 2012-12-28: qty 125

## 2012-12-28 MED ORDER — ALBUTEROL SULFATE (5 MG/ML) 0.5% IN NEBU: 2.5000 mg | INHALATION_SOLUTION | Freq: Once | RESPIRATORY_TRACT | Status: DC

## 2012-12-28 MED ORDER — VANCOMYCIN HCL IN DEXTROSE 1-5 GM/200ML-% IV SOLN
1000.0000 mg | Freq: Once | INTRAVENOUS | Status: AC
  Administered 2012-12-28: 1000 mg via INTRAVENOUS
  Filled 2012-12-28: qty 200

## 2012-12-28 MED ORDER — LEVOTHYROXINE SODIUM 100 MCG IV SOLR
44.0000 ug | Freq: Every day | INTRAVENOUS | Status: DC
  Administered 2012-12-28 – 2012-12-30 (×3): 44 ug via INTRAVENOUS
  Filled 2012-12-28 (×3): qty 5

## 2012-12-28 MED ORDER — ALBUTEROL SULFATE (5 MG/ML) 0.5% IN NEBU
2.5000 mg | INHALATION_SOLUTION | Freq: Four times a day (QID) | RESPIRATORY_TRACT | Status: DC
  Administered 2012-12-29: 2.5 mg via RESPIRATORY_TRACT
  Filled 2012-12-28: qty 0.5

## 2012-12-28 MED ORDER — INSULIN ASPART 100 UNIT/ML ~~LOC~~ SOLN
0.0000 [IU] | Freq: Three times a day (TID) | SUBCUTANEOUS | Status: DC
  Administered 2012-12-28 (×2): 5 [IU] via SUBCUTANEOUS
  Administered 2012-12-29 (×2): 1 [IU] via SUBCUTANEOUS

## 2012-12-28 MED ORDER — PANTOPRAZOLE SODIUM 40 MG IV SOLR
40.0000 mg | INTRAVENOUS | Status: DC
  Administered 2012-12-28 – 2012-12-29 (×2): 40 mg via INTRAVENOUS
  Filled 2012-12-28 (×3): qty 40

## 2012-12-28 MED ORDER — IPRATROPIUM BROMIDE 0.02 % IN SOLN: 0.5000 mg | Freq: Once | RESPIRATORY_TRACT | Status: DC

## 2012-12-28 MED ORDER — METOPROLOL TARTRATE 12.5 MG HALF TABLET
12.5000 mg | ORAL_TABLET | Freq: Two times a day (BID) | ORAL | Status: DC
  Administered 2012-12-28: 12.5 mg via ORAL
  Filled 2012-12-28 (×4): qty 1

## 2012-12-28 MED ORDER — ONDANSETRON HCL 4 MG/2ML IJ SOLN: 4.0000 mg | Freq: Four times a day (QID) | INTRAMUSCULAR | Status: DC | PRN

## 2012-12-28 MED ORDER — FLUCONAZOLE 100MG IVPB
100.0000 mg | INTRAVENOUS | Status: DC
  Administered 2012-12-29: 100 mg via INTRAVENOUS
  Filled 2012-12-28 (×2): qty 50

## 2012-12-28 MED ORDER — IPRATROPIUM BROMIDE 0.02 % IN SOLN
0.5000 mg | Freq: Four times a day (QID) | RESPIRATORY_TRACT | Status: DC
  Administered 2012-12-29: 0.5 mg via RESPIRATORY_TRACT
  Filled 2012-12-28: qty 2.5

## 2012-12-28 MED ORDER — PIPERACILLIN-TAZOBACTAM 3.375 G IVPB
3.3750 g | Freq: Once | INTRAVENOUS | Status: AC
  Administered 2012-12-28: 3.375 g via INTRAVENOUS
  Filled 2012-12-28: qty 50

## 2012-12-28 NOTE — H&P (Signed)
PATIENT DETAILS Name: [name] Age: 79 y.o. Sex: male Date of Birth: 11-Jul-1929 Admit Date: 12/28/2012 VWU:[id number], Toniann Fail, MD   CHIEF COMPLAINT:  Shortness of breath since this morning  HPI: [name] is a 79 y.o. male with a Past Medical History of recent GI bleed, bilateral BKA, brittle diabetes who presents today with the above noted complaint. Per patient, he was in his usual state of health last evening, this morning he noted he was short of breath. Per nursing home documentation, patient was in acute respiratory distress and profoundly hypoxic requiring 100% nonrebreather mask to maintain O2 saturations. Patient was then transferred urgently to the emergency room, x-ray suggested aspiration pneumonia. Patient was then given supportive care, IV antibiotics, nebulized bronchodilators, he was then able to be transitioned from 100% nonrebreather mask to nasal cannula. I was then asked to admit this patient for further evaluation and treatment. During my evaluation, patient appeared very comfortable, we will able to titrate his oxygen down to just 3-4 L per minute. he is now being admitted for further evaluation.   ALLERGIES:   Allergies  Allergen Reactions  . Bactrim (Sulfamethoxazole W-Trimethoprim) Swelling    Lips, eyes, cheeks swollen  . Doxycycline Swelling    Lips, eyes, cheeks were all swollen    PAST MEDICAL HISTORY: Past Medical History  Diagnosis Date  . Thyroid disease   . Anemia   . Hyperlipidemia   . Neuropathy, diabetic   . Diabetic retinopathy   . GERD (gastroesophageal reflux disease)   . Arthritis   . Urgency of urination   . Hypothyroidism   . Sacral wound     hx of  . Cancer   . Diabetic retinopathy   . Peripheral vascular disease     s/p BLE amputations.  . Diabetes mellitus     sees Dr. Chilton [facility])  . Chronic systolic heart failure 04/2012    EF 40-45%  . NSTEMI (non-ST elevated myocardial infarction) 04/2012  . Acute respiratory  failure 04/2012    VDRF.  Marland Kitchen Prostate cancer   . Hypertension   . Gastric polyp 11/2012    Source of upper GI bleed. Status post band ligation.  . 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    PAST SURGICAL HISTORY: Past Surgical History  Procedure Laterality Date  . Cataract extraction, bilateral    . Colonoscopy w/ polypectomy    . Esophagogastroduodenoscopy  04/25/2012    Procedure: ESOPHAGOGASTRODUODENOSCOPY (EGD);  Surgeon: Meryl Dare, MD,FACG;  Location: [facility];  Service: Endoscopy;  Laterality: N/A;  . Amputation  05/16/2012    Procedure: AMPUTATION BELOW KNEE;  Surgeon: Nadara Mustard, MD;  Location: [facility] OR;  Service: Orthopedics;  Laterality: Right;  Right Below Knee Amputation  . Tonsillectomy    . Foot amputation      right bka  . Eye surgery      bilateral  . Amputation  09/14/2012    Procedure: AMPUTATION BELOW KNEE;  Surgeon: Nadara Mustard, MD;  Location: [facility] OR;  Service: Orthopedics;  Laterality: Left;  . Amputation Left 10/23/2012    Procedure: Left below knee amputation revision;  Surgeon: Nadara Mustard, MD;  Location: [facility] OR;  Service: Orthopedics;  Laterality: Left;  left below knee amputation revision  . Esophagogastroduodenoscopy N/A 11/07/2012    Procedure: ESOPHAGOGASTRODUODENOSCOPY (EGD);  Surgeon: Louis Meckel, MD;  Location: [facility] ENDOSCOPY;  Service: Endoscopy;  Laterality: N/A;  . Colonoscopy N/A 12/13/2012    Procedure: COLONOSCOPY;  Surgeon: Rachael Fee, MD;  Location:  [facility] ENDOSCOPY;  Service: Endoscopy;  Laterality: N/A;  . Colonoscopy N/A 12/12/2012    Procedure: COLONOSCOPY;  Surgeon: Rachael Fee, MD;  Location: [facility] ENDOSCOPY;  Service: Endoscopy;  Laterality: N/A;  . Esophagogastroduodenoscopy N/A 12/12/2012    Procedure: ESOPHAGOGASTRODUODENOSCOPY (EGD);  Surgeon: Rachael Fee, MD;  Location: [facility] ENDOSCOPY;  Service: Endoscopy;  Laterality: N/A;    MEDICATIONS AT HOME: Prior to Admission medications   Medication Sig Start Date End Date Taking? Authorizing  Provider  albuterol (PROVENTIL HFA;VENTOLIN HFA) 108 (90 BASE) MCG/ACT inhaler Inhale 2 puffs into the lungs every 6 (six) hours as needed for wheezing.   Yes Historical Provider, MD  aspirin 81 MG chewable tablet Chew 1 tablet (81 mg total) by mouth daily. 04/26/12 04/26/13 Yes William S Minor, NP  docusate sodium (COLACE) 100 MG capsule Take 100 mg by mouth 2 (two) times daily.   Yes Historical Provider, MD  feeding supplement (PRO-STAT SUGAR FREE 64) LIQD Take 30 mLs by mouth 2 (two) times daily.   Yes Historical Provider, MD  ferrous sulfate 325 (65 FE) MG tablet Take 325 mg by mouth daily with breakfast.   Yes Historical Provider, MD  insulin aspart (NOVOLOG) 100 UNIT/ML injection Inject 0-9 Units into the skin 3 (three) times daily with meals. Do not give insulin if CBG is less than 140.   Maintain in range of 140 - 200 12/13/12  Yes Marianne L York, PA-C  insulin glargine (LANTUS) 100 UNIT/ML injection Inject 0.08 mLs (8 Units total) into the skin at bedtime. 12/13/12  Yes Marianne L York, PA-C  ipratropium (ATROVENT) 0.02 % nebulizer solution Take 500 mcg by nebulization every 2 (two) hours as needed (shortness of breath).   Yes Historical Provider, MD  levothyroxine (SYNTHROID, LEVOTHROID) 88 MCG tablet Take 88 mcg by mouth daily.   Yes Historical Provider, MD  metoCLOPramide (REGLAN) 5 MG tablet Take 5 mg by mouth 3 (three) times daily before meals.   Yes Historical Provider, MD  metoprolol tartrate (LOPRESSOR) 25 MG tablet Take 12.5 mg by mouth 2 (two) times daily.   Yes Historical Provider, MD  mirtazapine (REMERON) 15 MG tablet Take 22.5 mg by mouth at bedtime.   Yes Historical Provider, MD  omeprazole (PRILOSEC) 20 MG capsule Take 20 mg by mouth 2 (two) times daily.   Yes Historical Provider, MD  oxyCODONE-acetaminophen (ROXICET) 5-325 MG per tablet Take one tablet every 4 hours as needed 12/16/12  Yes Claudie Revering, NP  pantoprazole sodium (PROTONIX) 40 mg/20 mL PACK Take 40 mg by mouth  daily.   Yes Historical Provider, MD  promethazine (PHENERGAN) 25 MG tablet Take 25 mg by mouth every 6 (six) hours as needed for nausea.   Yes Historical Provider, MD  fluconazole (DIFLUCAN) 100 MG tablet Take 1 tablet (100 mg total) by mouth daily. 12/13/12   Tora Kindred York, PA-C  levofloxacin (LEVAQUIN) 750 MG tablet Take 1 tablet (750 mg total) by mouth daily. 12/13/12   Stephani Police, PA-C    FAMILY HISTORY: Family History  Problem Relation Age of Onset  . Hypertension      SOCIAL HISTORY:  reports that he quit smoking about 6 years ago. His smoking use included Cigarettes. He smoked 0.25 packs per day. He has never used smokeless tobacco. He reports that  drinks alcohol. He reports that he does not use illicit drugs.  REVIEW OF SYSTEMS:  Constitutional:   No  weight loss, night sweats,  Fevers, chills, fatigue.  HEENT:  No headaches, Difficulty swallowing,Tooth/dental problems,Sore throat,  No sneezing, itching, ear ache, nasal congestion, post nasal drip,   Cardio-vascular: No chest pain,  Orthopnea, PND, swelling in lower extremities, anasarca,  dizziness, palpitations  GI:  No heartburn, indigestion, abdominal pain, nausea, vomiting, diarrhea, change in  bowel habits, loss of appetite  Resp:   No excess mucus,  No coughing up of blood.No change in color of mucus.No chest wall deformity  Skin:  no rash or lesions.  GU:  no dysuria, change in color of urine, no urgency or frequency.  No flank pain.  Musculoskeletal: No joint pain or swelling.  No decreased range of motion.  No back pain.  Psych: No change in mood or affect. No depression or anxiety.  No memory loss.   PHYSICAL EXAM: Blood pressure 95/52, pulse 89, temperature 97.7 F (36.5 C), temperature source Oral, resp. rate 16, height 5' (1.524 m), weight 57.8 kg (127 lb 6.8 oz), SpO2 100.00%.  General appearance :Awake, alert, not in any distress. Speech Clear. Not toxic Looking HEENT: Atraumatic and  Normocephalic, pupils equally reactive to light and accomodation Neck: supple, no JVD. No cervical lymphadenopathy.  Chest:Good air entry bilaterally, bibasilar rales present.  CVS: S1 S2 regular, no murmurs.  Abdomen: Bowel sounds present, Non tender and not distended with no gaurding, rigidity or rebound. Extremities: Status post B/L BKA Neurology: Awake alert, and oriented X 3, CN II-XII intact, Non focal Skin:No Rash Wounds:N/A  LABS ON ADMISSION:   Recent Labs  12/28/12 0727 12/28/12 0752  NA 138 139  K 3.8 3.9  CL 97 100  CO2 24  --   GLUCOSE 347* 342*  BUN 18 17  CREATININE 0.84 1.10  CALCIUM 8.9  --     Recent Labs  12/28/12 0727  AST 12  ALT <5  ALKPHOS 115  BILITOT 0.2*  PROT 7.9  ALBUMIN 1.4*   No results found for this basename: LIPASE, AMYLASE,  in the last 72 hours  Recent Labs  12/28/12 0727 12/28/12 0752  WBC 15.4*  --   NEUTROABS 12.4*  --   HGB 8.4* 9.2*  HCT 26.6* 27.0*  MCV 88.1  --   PLT 484*  --    No results found for this basename: CKTOTAL, CKMB, CKMBINDEX, TROPONINI,  in the last 72 hours No results found for this basename: DDIMER,  in the last 72 hours No components found with this basename: POCBNP,    RADIOLOGIC STUDIES ON ADMISSION: Dg Chest Portable 1 View  12/28/2012  *RADIOLOGY REPORT*  Clinical Data: Respiratory distress  PORTABLE CHEST - 1 VIEW  Comparison: 12/10/2012; 04/26/2012; 04/20/2012  Findings: Grossly unchanged cardiac silhouette and mediastinal contours.  Interval development of heterogeneous air space opacity in the right mid and lower lung. Worsening retrocardiac opacities, possibly representing atelectasis.  There is persistent mild elevation of the right hemidiaphragm.  No definite pleural effusion or pneumothorax.  No definite evidence of edema.  Unchanged bones.  IMPRESSION: Ill-defined heterogeneous air space opacities within the right mid and lower lung worrisome for infection, less likely asymmetric pulmonary  edema.  Further evaluation with a PA and lateral chest radiograph may be obtained as clinically indicated.   Original Report Authenticated By: Tacey Ruiz, MD     ASSESSMENT AND PLAN: Present on Admission:  . Acute respiratory failure with hypoxia - Suspect this is predominantly from aspiration pneumonia. On presentation to the emergency room, required 100% nonrebreather mask, however during my evaluation significantly improved with supportive care  and IV antibiotics, now on 3-4 L of oxygen via nasal cannula. - Does have a history of systolic dysfunction-however clinical symptomatology and x-ray findings are more suggestive of a pneumonic process rather than CHF. Prolactin level also elevated, lactic acidosis present,favoring a pneumonic process - Get swallow eval, admitted to telemetry unit. Start on empiric vancomycin, Zosyn and Levaquin.   Marland Kitchen HCAP (healthcare-associated pneumonia) - Suspect that this is more often aspiration pneumonia  - Antibiotics and swallow evaluation as indicated above.   . Chronic systolic heart failure - Clinically compensated. As noted above, but hypoxia and respiratory distress probably secondary to aspiration episode. - Minimize IV fluids as much as possible.  . Anemia - Hemoglobin and hematocrit remained stable, recently with history of acute blood loss anemia from a presumed gastrointestinal bleed.   . Diabetes mellitus - Has a history of rectal diabetes, continue with low-dose Lantus and SSI. Given history of brittle diabetes, on for some permissive hyperglycemia.   . Hypertension - Continue with metoprolol   . Sacral decubitus ulcer - Stage II sacral decubitus present on admission. Obtaining wound care evaluation   . Protein-calorie malnutrition, moderate - Resume nutritional supplementation- once swallow evaluation done   . Hypothyroidism - Continue with levothyroxine-change to IV for now.  Further plan will depend as patient's clinical course  evolves and further radiologic and laboratory data become available. Patient will be monitored closely.   DVT Prophylaxis -Given her recent GI bleed-no Lovenox or heparin - cannot place bilateral SCDs-as patient is status post bilateral BKA  Code Status: DNR   Total time spent for admission equals 45 minutes.  [facility] Pager [phone]  If 7PM-7AM, please contact night-coverage [email or web address] Password [email or web address] 12/28/2012, 1:21 PM

## 2012-12-28 NOTE — Progress Notes (Addendum)
ANTIBIOTIC CONSULT NOTE - INITIAL  Pharmacy Consult for vancomycin and zosyn Indication: rule out pneumonia  Allergies  Allergen Reactions  . Bactrim (Sulfamethoxazole W-Trimethoprim) Swelling    Lips, eyes, cheeks swollen  . Doxycycline Swelling    Lips, eyes, cheeks were all swollen    Patient Measurements:   Adjusted Body Weight:   Vital Signs: BP: 113/48 mmHg (04/26 0930) Pulse Rate: 94 (04/26 0930) Intake/Output from previous day:   Intake/Output from this shift:    Labs:  Recent Labs  12/28/12 0727 12/28/12 0752  WBC 15.4*  --   HGB 8.4* 9.2*  PLT 484*  --   CREATININE 0.84 1.10   The CrCl is unknown because both a height and weight (above a minimum accepted value) are required for this calculation. No results found for this basename: VANCOTROUGH, Leodis Binet, VANCORANDOM, GENTTROUGH, GENTPEAK, GENTRANDOM, TOBRATROUGH, TOBRAPEAK, TOBRARND, AMIKACINPEAK, AMIKACINTROU, AMIKACIN,  in the last 72 hours   Microbiology: Recent Results (from the past 720 hour(s))  MRSA PCR SCREENING     Status: None   Collection Time    12/10/12 10:17 PM      Result Value Range Status   MRSA by PCR NEGATIVE  NEGATIVE Final   Comment:            The GeneXpert MRSA Assay (FDA     approved for NASAL specimens     only), is one component of a     comprehensive MRSA colonization     surveillance program. It is not     intended to diagnose MRSA     infection nor to guide or     monitor treatment for     MRSA infections.  URINE CULTURE     Status: None   Collection Time    12/10/12 10:52 PM      Result Value Range Status   Specimen Description URINE, CLEAN CATCH   Final   Special Requests NONE   Final   Culture  Setup Time 12/10/2012 23:40   Final   Colony Count >=100,000 COLONIES/ML   Final   Culture     Final   Value: PSEUDOMONAS AERUGINOSA     STAPHYLOCOCCUS AUREUS     Note: RIFAMPIN AND GENTAMICIN SHOULD NOT BE USED AS SINGLE DRUGS FOR TREATMENT OF STAPH INFECTIONS.   Report Status 12/15/2012 FINAL   Final   Organism ID, Bacteria PSEUDOMONAS AERUGINOSA   Final   Organism ID, Bacteria STAPHYLOCOCCUS AUREUS   Final  URINE CULTURE     Status: None   Collection Time    12/13/12  6:33 AM      Result Value Range Status   Specimen Description URINE, CLEAN CATCH   Final   Special Requests NONE   Final   Culture  Setup Time 12/13/2012 06:33   Final   Colony Count >=100,000 COLONIES/ML   Final   Culture     Final   Value: Multiple bacterial morphotypes present, none predominant. Suggest appropriate recollection if clinically indicated.   Report Status 12/14/2012 FINAL   Final    Medical History: Past Medical History  Diagnosis Date  . Thyroid disease   . Anemia   . Hyperlipidemia   . Neuropathy, diabetic   . Diabetic retinopathy   . GERD (gastroesophageal reflux disease)   . Arthritis   . Urgency of urination   . Hypothyroidism   . Sacral wound     hx of  . Cancer   . Diabetic retinopathy   . Peripheral  vascular disease     s/p BLE amputations.  . Diabetes mellitus     sees Dr. Chilton [facility])  . Chronic systolic heart failure 04/2012    EF 40-45%  . NSTEMI (non-ST elevated myocardial infarction) 04/2012  . Acute respiratory failure 04/2012    VDRF.  Marland Kitchen Prostate cancer   . Hypertension   . Gastric polyp 11/2012    Source of upper GI bleed. Status post band ligation.  . 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    Medications:  Scheduled:  . ipratropium  0.5 mg Nebulization Q4H   And  . albuterol  2.5 mg Nebulization Q4H  . [COMPLETED] albuterol  5 mg Nebulization Once  . aspirin  81 mg Oral Daily  . insulin aspart  0-9 Units Subcutaneous TID WC  . insulin glargine  8 Units Subcutaneous QHS  . levofloxacin (LEVAQUIN) IV  750 mg Intravenous Q24H  . levofloxacin (LEVAQUIN) IV  750 mg Intravenous Q24H  . levothyroxine  44 mcg Intravenous Daily  . metoCLOPramide (REGLAN) injection  5 mg Intravenous TID AC  . metoprolol tartrate  12.5 mg Oral BID  .  mirtazapine  22.5 mg Oral QHS  . pantoprazole (PROTONIX) IV  40 mg Intravenous Q24H  . [COMPLETED] piperacillin-tazobactam (ZOSYN)  IV  3.375 g Intravenous Once  . [COMPLETED] sodium chloride  500 mL Intravenous Once  . [COMPLETED] sodium chloride  500 mL Intravenous Once  . [COMPLETED] vancomycin  1,000 mg Intravenous Once  . [DISCONTINUED] albuterol  2.5 mg Nebulization Once  . [DISCONTINUED] ipratropium  0.5 mg Nebulization Once   Infusions:  . sodium chloride     Assessment: 79 yo male with suspected PNA will be put on vancomycin and zosyn x 8 days.  SCr 1.1 (CrCl ~36.6).  Patient had received one dose of vancomycin 1g iv x1 at 0836 and zosyn 3.375g iv q8h at 0817  Goal of Therapy:  Vancomycin trough level 15-20 mcg/ml  Plan:  1) Vancomycin 1g iv q24h x 7 more doses, next dose at 0830 on 12/29/12 2) Zosyn 3.375g iv q8h (4h infusion) x 24 doses, next dose at 1430 today 3) Monitor renal function and plan on antibiotic before checking vancomycin trough 4) Change levaquin to 750mg  iv q48h  Baylynn Shifflett, Tsz-Yin 12/28/2012,11:28 AM

## 2012-12-28 NOTE — ED Notes (Signed)
Pt changed from NRB to [place] at 4L and tolerating well. Pt on the phone talking to his daughter.

## 2012-12-28 NOTE — ED Provider Notes (Signed)
History     CSN: [id number]  Arrival date & time 12/28/12  7829   First MD Initiated Contact with Patient 12/28/12 [phone]     Level V caveat due 2 condition of patient Chief Complaint  Patient presents with  . Respiratory Distress    (Consider location/radiation/quality/duration/timing/severity/associated sxs/prior treatment) The history is provided by the patient.   patient was brought in from the nursing home with difficulty breathing. There was reported possible aspiration. Sugar found to be 400 and sat in the 80s. He was recently in the hospital for anemia from a GI bleed.  Past Medical History  Diagnosis Date  . Thyroid disease   . Anemia   . Hyperlipidemia   . Neuropathy, diabetic   . Diabetic retinopathy   . GERD (gastroesophageal reflux disease)   . Arthritis   . Urgency of urination   . Hypothyroidism   . Sacral wound     hx of  . Cancer   . Diabetic retinopathy   . Peripheral vascular disease     s/p BLE amputations.  . Diabetes mellitus     sees Dr. Chilton Si [facility])  . Chronic systolic heart failure 04/2012    EF 40-45%  . NSTEMI (non-ST elevated myocardial infarction) 04/2012  . Acute respiratory failure 04/2012    VDRF.  Marland Kitchen Prostate cancer   . Hypertension   . Gastric polyp 11/2012    Source of upper GI bleed. Status post band ligation.  . 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    Past Surgical History  Procedure Laterality Date  . Cataract extraction, bilateral    . Colonoscopy w/ polypectomy    . Esophagogastroduodenoscopy  04/25/2012    Procedure: ESOPHAGOGASTRODUODENOSCOPY (EGD);  Surgeon: Meryl Dare, MD,FACG;  Location: [facility] ENDOSCOPY;  Service: Endoscopy;  Laterality: N/A;  . Amputation  05/16/2012    Procedure: AMPUTATION BELOW KNEE;  Surgeon: Nadara Mustard, MD;  Location: [facility] OR;  Service: Orthopedics;  Laterality: Right;  Right Below Knee Amputation  . Tonsillectomy    . Foot amputation      right bka  . Eye surgery      bilateral  . Amputation  09/14/2012    Procedure: AMPUTATION BELOW KNEE;  Surgeon: Nadara Mustard, MD;  Location: [facility] OR;  Service: Orthopedics;  Laterality: Left;  . Amputation Left 10/23/2012    Procedure: Left below knee amputation revision;  Surgeon: Nadara Mustard, MD;  Location: [facility] OR;  Service: Orthopedics;  Laterality: Left;  left below knee amputation revision  . Esophagogastroduodenoscopy N/A 11/07/2012    Procedure: ESOPHAGOGASTRODUODENOSCOPY (EGD);  Surgeon: Louis Meckel, MD;  Location: [facility] ENDOSCOPY;  Service: Endoscopy;  Laterality: N/A;  . Colonoscopy N/A 12/13/2012    Procedure: COLONOSCOPY;  Surgeon: Rachael Fee, MD;  Location: [facility] ENDOSCOPY;  Service: Endoscopy;  Laterality: N/A;  . Colonoscopy N/A 12/12/2012    Procedure: COLONOSCOPY;  Surgeon: Rachael Fee, MD;  Location: [facility] ENDOSCOPY;  Service: Endoscopy;  Laterality: N/A;  . Esophagogastroduodenoscopy N/A 12/12/2012    Procedure: ESOPHAGOGASTRODUODENOSCOPY (EGD);  Surgeon: Rachael Fee, MD;  Location: [facility] ENDOSCOPY;  Service: Endoscopy;  Laterality: N/A;    Family History  Problem Relation Age of Onset  . Hypertension      History  Substance Use Topics  . Smoking status: Former Smoker -- 0.25 packs/day    Types: Cigarettes    Quit date: 11/22/2006  . Smokeless tobacco: Never Used  . Alcohol Use: 0.0 oz/week    1-2 Cans of beer per  week     Comment: drinks with meals      Review of Systems  Unable to perform ROS   Allergies  Bactrim and Doxycycline  Home Medications   No current outpatient prescriptions on file.  BP 95/52  Pulse 89  Temp(Src) 97.7 F (36.5 C) (Oral)  Resp 16  Ht 5' (1.524 m)  Wt 127 lb 6.8 oz (57.8 kg)  BMI 24.89 kg/m2  SpO2 100%  Physical Exam  Constitutional: He appears well-developed.  HENT:  Head: Normocephalic.  Eyes: Pupils are equal, round, and reactive to light.  Neck: Neck supple.  Cardiovascular:  Tachycardia.  Pulmonary/Chest:  Mildly decreased breath sounds throughout  Abdominal: Soft. He  exhibits no distension.  Musculoskeletal:  Bilateral BKA  Neurological:  Decreased responsiveness. Will answer questions.  Skin: Skin is warm. No pallor.    ED Course  Procedures (including critical care time)  Labs Reviewed  CBC WITH DIFFERENTIAL - Abnormal; Notable for the following:    WBC 15.4 (*)    RBC 3.02 (*)    Hemoglobin 8.4 (*)    HCT 26.6 (*)    RDW 17.7 (*)    Platelets 484 (*)    Neutrophils Relative 81 (*)    Neutro Abs 12.4 (*)    All other components within normal limits  COMPREHENSIVE METABOLIC PANEL - Abnormal; Notable for the following:    Glucose, Bld 347 (*)    Albumin 1.4 (*)    Total Bilirubin 0.2 (*)    GFR calc non Af Amer 79 (*)    All other components within normal limits  PROTIME-INR - Abnormal; Notable for the following:    Prothrombin Time 15.9 (*)    All other components within normal limits  APTT - Abnormal; Notable for the following:    aPTT 43 (*)    All other components within normal limits  PRO B NATRIURETIC PEPTIDE - Abnormal; Notable for the following:    Pro B Natriuretic peptide (BNP) 2684.0 (*)    All other components within normal limits  GLUCOSE, CAPILLARY - Abnormal; Notable for the following:    Glucose-Capillary 277 (*)    All other components within normal limits  CG4 I-STAT (LACTIC ACID) - Abnormal; Notable for the following:    Lactic Acid, Venous 2.88 (*)    All other components within normal limits  POCT I-STAT, CHEM 8 - Abnormal; Notable for the following:    Glucose, Bld 342 (*)    Hemoglobin 9.2 (*)    HCT 27.0 (*)    All other components within normal limits  CULTURE, BLOOD (ROUTINE X 2)  CULTURE, BLOOD (ROUTINE X 2)  CULTURE, EXPECTORATED SPUTUM-ASSESSMENT  GRAM STAIN  PROCALCITONIN  URINALYSIS, ROUTINE W REFLEX MICROSCOPIC  HIV ANTIBODY (ROUTINE TESTING)  LEGIONELLA ANTIGEN, URINE  STREP PNEUMONIAE URINARY ANTIGEN  POCT I-STAT TROPONIN I  OCCULT BLOOD, POC DEVICE  SAMPLE TO BLOOD BANK   Dg Chest  Portable 1 View  12/28/2012  *RADIOLOGY REPORT*  Clinical Data: Respiratory distress  PORTABLE CHEST - 1 VIEW  Comparison: 12/10/2012; 04/26/2012; 04/20/2012  Findings: Grossly unchanged cardiac silhouette and mediastinal contours.  Interval development of heterogeneous air space opacity in the right mid and lower lung. Worsening retrocardiac opacities, possibly representing atelectasis.  There is persistent mild elevation of the right hemidiaphragm.  No definite pleural effusion or pneumothorax.  No definite evidence of edema.  Unchanged bones.  IMPRESSION: Ill-defined heterogeneous air space opacities within the right mid and lower lung worrisome for  infection, less likely asymmetric pulmonary edema.  Further evaluation with a PA and lateral chest radiograph may be obtained as clinically indicated.   Original Report Authenticated By: Tacey Ruiz, MD      1. HCAP (healthcare-associated pneumonia)   2. Acute respiratory failure with hypoxia   3. Anemia   4. Diabetes mellitus     Date: 12/28/2012  Rate: 106  Rhythm: sinus tachycardia  QRS Axis: normal  Intervals: normal  ST/T Wave abnormalities: nonspecific ST/T changes  Conduction Disutrbances:right bundle branch block  Narrative Interpretation: Baseline artifact  Old EKG Reviewed: unchanged     MDM  Patient presents with hypoxia and shortness of breath. Found to have likely pneumonia on x-ray. Initial severe hypoxia has improved. Initially was going to be placed to step down bed but medicine eventually admitted to telemetry. Anemia is improved from previous. Will be admitted to internal medicine.        Juliet Rude. Rubin Payor, MD 12/28/12 1606

## 2012-12-28 NOTE — Evaluation (Signed)
Clinical/Bedside Swallow Evaluation Patient Details  Name: [name] MRN: [id number] Date of Birth: [birth year]  Today's Date: 12/28/2012 Time: 1330-1400 SLP Time Calculation (min): 30 min  Past Medical History:  Past Medical History  Diagnosis Date  . Thyroid disease   . Anemia   . Hyperlipidemia   . Neuropathy, diabetic   . Diabetic retinopathy   . GERD (gastroesophageal reflux disease)   . Arthritis   . Urgency of urination   . Hypothyroidism   . Sacral wound     hx of  . Cancer   . Diabetic retinopathy   . Peripheral vascular disease     s/p BLE amputations.  . Diabetes mellitus     sees Dr. Chilton Si [facility])  . Chronic systolic heart failure 04/2012    EF 40-45%  . NSTEMI (non-ST elevated myocardial infarction) 04/2012  . Acute respiratory failure 04/2012    VDRF.  Marland Kitchen Prostate cancer   . Hypertension   . Gastric polyp 11/2012    Source of upper GI bleed. Status post band ligation.  . 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   Past Surgical History:  Past Surgical History  Procedure Laterality Date  . Cataract extraction, bilateral    . Colonoscopy w/ polypectomy    . Esophagogastroduodenoscopy  04/25/2012    Procedure: ESOPHAGOGASTRODUODENOSCOPY (EGD);  Surgeon: Meryl Dare, MD,FACG;  Location: [facility] ENDOSCOPY;  Service: Endoscopy;  Laterality: N/A;  . Amputation  05/16/2012    Procedure: AMPUTATION BELOW KNEE;  Surgeon: Nadara Mustard, MD;  [facility] OR;  Service: Orthopedics;  Laterality: Right;  Right Below Knee Amputation  . Tonsillectomy    . Foot amputation      right bka  . Eye surgery      bilateral  . Amputation  09/14/2012    Procedure: AMPUTATION BELOW KNEE;  Surgeon: Nadara Mustard, MD;  Location: [facility] OR;  Service: Orthopedics;  Laterality: Left;  . Amputation Left 10/23/2012    Procedure: Left below knee amputation revision;  Surgeon: Nadara Mustard, MD;  [facility] OR;  Service: Orthopedics;  Laterality: Left;  left below knee amputation revision  .  Esophagogastroduodenoscopy N/A 11/07/2012    Procedure: ESOPHAGOGASTRODUODENOSCOPY (EGD);  Surgeon: Louis Meckel, MD;  Location: [facility] ENDOSCOPY;  Service: Endoscopy;  Laterality: N/A;  . Colonoscopy N/A 12/13/2012    Procedure: COLONOSCOPY;  Surgeon: Rachael Fee, MD;  Location: [facility] ENDOSCOPY;  Service: Endoscopy;  Laterality: N/A;  . Colonoscopy N/A 12/12/2012    Procedure: COLONOSCOPY;  Surgeon: Rachael Fee, MD;  Location: [facility] ENDOSCOPY;  Service: Endoscopy;  Laterality: N/A;  . Esophagogastroduodenoscopy N/A 12/12/2012    Procedure: ESOPHAGOGASTRODUODENOSCOPY (EGD);  Surgeon: Rachael Fee, MD;  Location: [facility] ENDOSCOPY;  Service: Endoscopy;  Laterality: N/A;   HPI:  [name] is a 79 y.o. male with a Past Medical History of recent GI bleed, bilateral BKA, brittle diabetes who presents today with the above noted complaint. Per patient, he was in his usual state of health last evening, this morning he noted he was short of breath. Per nursing home documentation, patient was in acute respiratory distress and profoundly hypoxic requiring 100% nonrebreather mask to maintain O2 saturations. Patient was then transferred urgently to the emergency room, x-ray suggested aspiration pneumonia. Patient was then given supportive care, IV antibiotics, nebulized bronchodilators, he was then able to be transitioned from 100% nonrebreather mask to nasal cannula. I was then asked to admit this patient for further evaluation and treatment. Patient referred for BSE to assess  risk for aspiration.  Assessment / Plan / Recommendation Clinical Impression  Moderate oral dysphagia with suspected pharyngeal dysphagia.  +s/s of aspiration s/p swallow of trials of thin water by spoon and cup with weak, ineffective throat clear/cough.  Reduced  s/s of aspiration with nectar thick liquid and puree trials.  No attempt to masticate trial mechanical soft solid with patient swallowing bite whole.  Recommend to proceed with  conservative diet consistency of dysphagia 1 ( puree) and nectar thick liquids with full supervision with all meals to cue patient as necessary.  Due to history of dysphagia and current respiratory status recommend to proceed with objective evaluation of MBS to assess risk for aspiration and recommend safest, PO diet. MBS to be completed on 12/29/12/.    Aspiration Risk  Severe    Diet Recommendation Dysphagia 1 (Puree);Nectar-thick liquid   Liquid Administration via: Cup;No straw Medication Administration: Crushed with puree Supervision: Full supervision/cueing for compensatory strategies Compensations: Slow rate;Small sips/bites;Clear throat intermittently Postural Changes and/or Swallow Maneuvers: Seated upright 90 degrees;Upright 30-60 min after meal    Other  Recommendations Oral Care Recommendations: Oral care QID Other Recommendations: Order thickener from pharmacy;Prohibited food (jello, ice cream, thin soups);Remove water pitcher;Clarify dietary restrictions   Follow Up Recommendations  Skilled Nursing facility    Frequency and Duration min 2x/week  2 weeks       SLP Swallow Goals Patient will utilize recommended strategies during swallow to increase swallowing safety with: Moderate assistance   Swallow Study Prior Functional Status   Resident of SNF    General Date of Onset: 12/27/12 HPI: [name] is a 79 y.o. male with a Past Medical History of recent GI bleed, bilateral BKA, brittle diabetes who presents today with the above noted complaint. Per patient, he was in his usual state of health last evening, this morning he noted he was short of breath. Per nursing home documentation, patient was in acute respiratory distress and profoundly hypoxic requiring 100% nonrebreather mask to maintain O2 saturations. Patient was then transferred urgently to the emergency room, x-ray suggested aspiration pneumonia. Patient was then given supportive care, IV antibiotics, nebulized  bronchodilators, he was then able to be transitioned from 100% nonrebreather mask to nasal cannula. I was then asked to admit this patient for further evaluation and treatment. During my evaluation, patient appeared very comfortable, we will able to titrate his oxygen down to just 3-4 L per minute. he is now being admitted for further evaluation. Type of Study: Bedside swallow evaluation Previous Swallow Assessment: MBS 04/30/12 with recommendations of NPO/temporary means of nutrition/hydration, MBS on 05/07/12 with diet recommendations of dysphagia 1 and nectar thick liquids Diet Prior to this Study: NPO Respiratory Status: Supplemental O2 delivered via (comment) (nasal cannula) History of Recent Intubation: No Behavior/Cognition: Alert;Cooperative;Pleasant mood;Confused Oral Cavity - Dentition: Poor condition;Missing dentition Self-Feeding Abilities: Needs assist Patient Positioning: Upright in bed Baseline Vocal Quality: Hoarse;Low vocal intensity Volitional Cough: Weak Volitional Swallow: Able to elicit    Oral/Motor/Sensory Function Overall Oral Motor/Sensory Function: Impaired at baseline Labial ROM: Within Functional Limits Labial Symmetry: Within Functional Limits Labial Strength: Within Functional Limits Labial Sensation: Within Functional Limits Lingual ROM: Reduced right;Reduced left Lingual Strength: Reduced Lingual Sensation: Reduced Facial ROM: Within Functional Limits Facial Symmetry: Within Functional Limits Facial Strength: Within Functional Limits Facial Sensation: Within Functional Limits Velum: Within Functional Limits Mandible: Within Functional Limits   Ice Chips Ice chips: Not tested   Thin Liquid Thin Liquid: Impaired Presentation: Cup;Spoon Pharyngeal  Phase Impairments:  Suspected delayed Swallow;Decreased hyoid-laryngeal movement;Multiple swallows;Wet Vocal Quality;Throat Clearing - Delayed;Cough - Delayed    Nectar Thick Nectar Thick Liquid: Impaired Oral  phase functional implications: Oral holding Pharyngeal Phase Impairments: Suspected delayed Swallow;Decreased hyoid-laryngeal movement   Honey Thick Honey Thick Liquid: Not tested   Puree Puree: Impaired Presentation: Self Fed Pharyngeal Phase Impairments: Suspected delayed Swallow;Decreased hyoid-laryngeal movement;Throat Clearing - Delayed   Solid   GO    Solid: Impaired Oral Phase Impairments: Poor awareness of bolus Pharyngeal Phase Impairments: Suspected delayed Swallow;Decreased hyoid-laryngeal movement Other Comments: no attempts to [employer] MS, CCC-SLP [phone] [facility] 12/28/2012,6:13 PM

## 2012-12-28 NOTE — Progress Notes (Signed)
ANTIBIOTIC CONSULT NOTE - INITIAL  Pharmacy Consult for Levaquin Indication: pneumonia (HCAP)  Allergies  Allergen Reactions  . Bactrim (Sulfamethoxazole W-Trimethoprim) Swelling    Lips, eyes, cheeks swollen  . Doxycycline Swelling    Lips, eyes, cheeks were all swollen    Patient Measurements:     Vital Signs: BP: 111/35 mmHg (04/26 0830) Pulse Rate: 98 (04/26 0830) Intake/Output from previous day:   Intake/Output from this shift:    Labs:  Recent Labs  12/28/12 0727 12/28/12 0752  WBC 15.4*  --   HGB 8.4* 9.2*  PLT 484*  --   CREATININE 0.84 1.10   Estimated CrCl ~ 52 ml/min   Microbiology: Recent Results (from the past 720 hour(s))  MRSA PCR SCREENING     Status: None   Collection Time    12/10/12 10:17 PM      Result Value Range Status   MRSA by PCR NEGATIVE  NEGATIVE Final   Comment:            The GeneXpert MRSA Assay (FDA     approved for NASAL specimens     only), is one component of a     comprehensive MRSA colonization     surveillance program. It is not     intended to diagnose MRSA     infection nor to guide or     monitor treatment for     MRSA infections.  URINE CULTURE     Status: None   Collection Time    12/10/12 10:52 PM      Result Value Range Status   Specimen Description URINE, CLEAN CATCH   Final   Special Requests NONE   Final   Culture  Setup Time 12/10/2012 23:40   Final   Colony Count >=100,000 COLONIES/ML   Final   Culture     Final   Value: PSEUDOMONAS AERUGINOSA     STAPHYLOCOCCUS AUREUS     Note: RIFAMPIN AND GENTAMICIN SHOULD NOT BE USED AS SINGLE DRUGS FOR TREATMENT OF STAPH INFECTIONS.   Report Status 12/15/2012 FINAL   Final   Organism ID, Bacteria PSEUDOMONAS AERUGINOSA   Final   Organism ID, Bacteria STAPHYLOCOCCUS AUREUS   Final  URINE CULTURE     Status: None   Collection Time    12/13/12  6:33 AM      Result Value Range Status   Specimen Description URINE, CLEAN CATCH   Final   Special Requests NONE    Final   Culture  Setup Time 12/13/2012 06:33   Final   Colony Count >=100,000 COLONIES/ML   Final   Culture     Final   Value: Multiple bacterial morphotypes present, none predominant. Suggest appropriate recollection if clinically indicated.   Report Status 12/14/2012 FINAL   Final    Medical History: Past Medical History  Diagnosis Date  . Thyroid disease   . Anemia   . Hyperlipidemia   . Neuropathy, diabetic   . Diabetic retinopathy   . GERD (gastroesophageal reflux disease)   . Arthritis   . Urgency of urination   . Hypothyroidism   . Sacral wound     hx of  . Cancer   . Diabetic retinopathy   . Peripheral vascular disease     s/p BLE amputations.  . Diabetes mellitus     sees Dr. Chilton Si [facility])  . Chronic systolic heart failure 04/2012    EF 40-45%  . NSTEMI (non-ST elevated myocardial infarction) 04/2012  . Acute  respiratory failure 04/2012    VDRF.  Marland Kitchen Prostate cancer   . Hypertension   . Gastric polyp 11/2012    Source of upper GI bleed. Status post band ligation.  . 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    Medications:  Received Vancomycin 1gm and Zosyn 3.375gm IV x 1 doses in the ED ~ 0730  Assessment: 79 yo M admitted from [facility] with increased SOB and O2 sats ~ 84%.  Pt to start empiric abx for HCAP.  SCr 1.1 and estimated CrCl ~ 52 ml/min.  Goal of Therapy:  Renal dose adjustment of antibiotics  Plan:  Levaquin 750 mg IV q24h. Follow renal function, clinical progress, and LOT. Consider changing to PO if afebrile and clinically improving at 48 hours.  [employer], Pharm.D., BCPS Clinical Pharmacist Pager [phone] 12/28/2012 8:56 AM

## 2012-12-28 NOTE — Progress Notes (Signed)
[name] [id number] Admission Data: 12/28/2012 12:05 PM Attending Provider: Maretta Bees, MD  WUJ:[id number], [facility], MD Consults/ Treatment Team:    [name] is a 79 y.o. male patient admitted from ED awake, alert  & orientated  X 3,  DNR, VSS - Blood pressure 95/52, pulse 89, temperature 97.7 F (36.5 C), temperature source Oral, resp. rate 16, height 5' (1.524 m), weight 57.8 kg (127 lb 6.8 oz), SpO2 100.00%., O2    4 L nasal cannular, no c/o shortness of breath, no c/o chest pain, no distress noted. Tele # [id number] placed and pt is currently running:normal sinus rhythm.   IV site WDL:  hand right, condition patent and no redness and left, condition patent and no redness and antecubital right, condition patent and no redness with a transparent dsg that's clean dry and intact.  Allergies:   Allergies  Allergen Reactions  . Bactrim (Sulfamethoxazole W-Trimethoprim) Swelling    Lips, eyes, cheeks swollen  . Doxycycline Swelling    Lips, eyes, cheeks were all swollen     Past Medical History  Diagnosis Date  . Thyroid disease   . Anemia   . Hyperlipidemia   . Neuropathy, diabetic   . Diabetic retinopathy   . GERD (gastroesophageal reflux disease)   . Arthritis   . Urgency of urination   . Hypothyroidism   . Sacral wound     hx of  . Cancer   . Diabetic retinopathy   . Peripheral vascular disease     s/p BLE amputations.  . Diabetes mellitus     sees Dr. Chilton Si [facility])  . Chronic systolic heart failure 04/2012    EF 40-45%  . NSTEMI (non-ST elevated myocardial infarction) 04/2012  . Acute respiratory failure 04/2012    VDRF.  Marland Kitchen Prostate cancer   . Hypertension   . Gastric polyp 11/2012    Source of upper GI bleed. Status post band ligation.  . 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    History:  obtained from the patient. Tobacco/alcohol: denied none  Pt orientation to unit, room and routine. Information packet given to patient/family and safety video watched.  Admission INP  armband ID verified with patient/family, and in place. SR up x 2, fall risk assessment complete with Patient and family verbalizing understanding of risks associated with falls. Pt verbalizes an understanding of how to use the call bell and to call for help before getting out of bed. Skin has stage 2 on sacrum. Also two skin abrasion on L leg. New dressing applied xeroform, gauze and cobain.   Will cont to monitor and assist as needed.  [name] Diss Consuella Lose, RN 12/28/2012 12:05 PM

## 2012-12-28 NOTE — ED Notes (Signed)
Pt arrived form [facility] via [facility], SOB 84% on non rebreather. Upon arrival Pt alert. And denies CP now, but had CP earlier this morning. Currently ST with Right bundle branch block

## 2012-12-29 ENCOUNTER — Inpatient Hospital Stay (HOSPITAL_COMMUNITY): Payer: Medicare Other

## 2012-12-29 DIAGNOSIS — J189 Pneumonia, unspecified organism: Secondary | ICD-10-CM

## 2012-12-29 DIAGNOSIS — D649 Anemia, unspecified: Secondary | ICD-10-CM

## 2012-12-29 DIAGNOSIS — N179 Acute kidney failure, unspecified: Secondary | ICD-10-CM

## 2012-12-29 DIAGNOSIS — A419 Sepsis, unspecified organism: Principal | ICD-10-CM

## 2012-12-29 DIAGNOSIS — J9600 Acute respiratory failure, unspecified whether with hypoxia or hypercapnia: Secondary | ICD-10-CM

## 2012-12-29 DIAGNOSIS — E440 Moderate protein-calorie malnutrition: Secondary | ICD-10-CM

## 2012-12-29 LAB — COMPREHENSIVE METABOLIC PANEL
ALT: <5 U/L (ref 0–53)
Alkaline Phosphatase: 92 U/L (ref 39–117)
CO2: 30 mEq/L (ref 19–32)
Chloride: 104 mEq/L (ref 96–112)
GFR calc Af Amer: 52 mL/min — ABNORMAL LOW (ref >90)
GFR calc non Af Amer: 45 mL/min — ABNORMAL LOW (ref >90)
Glucose, Bld: 138 mg/dL — ABNORMAL HIGH (ref 70–99)
Potassium: 4 mEq/L (ref 3.5–5.1)
Sodium: 141 mEq/L (ref 135–145)
Total Bilirubin: 0.2 mg/dL — ABNORMAL LOW (ref 0.3–1.2)

## 2012-12-29 LAB — GLUCOSE, CAPILLARY
Glucose-Capillary: 122 mg/dL — ABNORMAL HIGH (ref 70–99)
Glucose-Capillary: 139 mg/dL — ABNORMAL HIGH (ref 70–99)
Glucose-Capillary: 226 mg/dL — ABNORMAL HIGH (ref 70–99)
Glucose-Capillary: 204 mg/dL — ABNORMAL HIGH (ref 70–99)

## 2012-12-29 LAB — CBC
HCT: 20.1 % — ABNORMAL LOW (ref 39.0–52.0)
Hemoglobin: 6.6 g/dL — CL (ref 13.0–17.0)
RBC: 2.37 MIL/uL — ABNORMAL LOW (ref 4.22–5.81)

## 2012-12-29 MED ORDER — ALBUTEROL SULFATE (5 MG/ML) 0.5% IN NEBU: 2.5000 mg | INHALATION_SOLUTION | RESPIRATORY_TRACT | Status: DC | PRN

## 2012-12-29 MED ORDER — SODIUM CHLORIDE 0.9 % IV BOLUS (SEPSIS)
250.0000 mL | Freq: Once | INTRAVENOUS | Status: AC
  Administered 2012-12-29: 250 mL via INTRAVENOUS

## 2012-12-29 MED ORDER — SODIUM CHLORIDE 0.9 % IV SOLN
INTRAVENOUS | Status: DC
  Administered 2012-12-29 (×2): via INTRAVENOUS

## 2012-12-29 MED ORDER — LEVOFLOXACIN IN D5W 500 MG/100ML IV SOLN
500.0000 mg | INTRAVENOUS | Status: DC
  Filled 2012-12-29: qty 100

## 2012-12-29 NOTE — Progress Notes (Signed)
Patient lethargic this am-intermittent hypotension overnight-requiring IVF boluses. Poor overall prognosis-suspect patient has ongoing aspiration-given lethargy and drooling. Spoke with daughter-Ms Glean Salvo [phone]. Explained that we need to transition to comfort care/Hospice-she understood. Will place palliative care consultation. Full progress note to follow

## 2012-12-29 NOTE — Plan of Care (Signed)
Problem: Phase I Progression Outcomes Goal: Voiding-avoid urinary catheter unless indicated Outcome: Not Progressing Foley for comfort

## 2012-12-29 NOTE — Progress Notes (Signed)
SLP Cancellation Note  Patient Details Name: Rochell Mabie MRN: [id number] DOB: 17-Mar-1929   Cancelled treatment:  MBS to be completed this date.  Evaluation not completed as patient presenting with lethargy per RN report.  Aware of hospice consult.  ST to follow for POC.  Moreen Fowler MS, CCC-SLP [phone] [facility] 12/29/2012, 4:31 PM

## 2012-12-29 NOTE — Consult Note (Addendum)
Palliative Medicine Team at [facility]  Date: 12/30/2012   Patient Name: [name]  DOB: 11/29/1928  MRN: [id number]  Age / Sex: 79 y.o., male   PCP: [name], MD Referring Physician: Maretta Bees, MD  HPI/Reason for Consultation: 79 yo man with multi-organ failure: CKD, CHF,Bilateral amputee, PVD, recurrent PNA, malnutrition and decubitus.Patient stating he is "tired" minimal PO intake, he says that has been "rough" , wants comfort and dignity. He continues to have intermittent confusion, sleeping more. Inconsistent PO intake, but per family 2 weeks ago just stopped eating. He had expressed a desire to die to several family members. He continues to decline in hospital, drastic hb drop, worsening renal failure, and aspiration PNA- PMT asked to consult for goals of care.     Participants in Discussion: 4 children, his wife, several grandchildren and great grandchildren.   Goals/Summary of Discussion:  1. Code Status:  DNR  2. Scope of Treatment:  Comfort care is the primary goal  Continue current abx for now, discontinue at discharge  Comfort feeding  Aggressive symptom management  3. Assessment/Plan:  Primary Diagnoses  1. Respiratory Failure, Aspiration PNA with sepsis 2. Acute on Chronic Renal Failure, not an HD candidate 3. Acute Anemia, possible GI blood loss, no transfusion 4. Terminal Dysphagia 5. Congestive Heart Failure, EF 40%, hypokinesis 6. Stage 2 sacral decubitus 7. Severe PVD, s/p bilateral BKAs 8. ASCVD, NSTEMI 9. Prostate Cancer 10. Diabetes, with complications   Prognosis  PPS 30%   Active Symptoms 1. Confusion 2. Dyspnea 3. Sacral decubitus 4. Extremity pain from PAD   4. Palliative Prophylaxis:   Bowel Regimen -prn dulcolax  Terminal Secretions-none  Breakthrough Pain and Dyspnea- roxanol prn   Agitation and Delirium- ativan  Nausea-zofran  5. Psychosocial Spiritual Asssessment/Interventions: Patient's wife and  daughters are very much at peace, wife expresses that she is grateful for the care and the approach. His sons are a bit more guarded as well as his grandchildren. Some family just now grasping just how sick he is- differnent family members have different perception of Hospice prior to our meeting.   6. Disposition: He is residential hospice appropriate. Family agree to this plan.  Social History:   reports that he quit smoking about 6 years ago. His smoking use included Cigarettes. He smoked 0.25 packs per day. He has never used smokeless tobacco. He reports that  drinks alcohol. He reports that he does not use illicit drugs.   Family History: Family History  Problem Relation Age of Onset  . Hypertension      Active Medications:  Outpatient medications: Prescriptions prior to admission  Medication Sig Dispense Refill  . albuterol (PROVENTIL HFA;VENTOLIN HFA) 108 (90 BASE) MCG/ACT inhaler Inhale 2 puffs into the lungs every 6 (six) hours as needed for wheezing.      Marland Kitchen aspirin 81 MG chewable tablet Chew 1 tablet (81 mg total) by mouth daily.      Marland Kitchen docusate sodium (COLACE) 100 MG capsule Take 100 mg by mouth 2 (two) times daily.      . feeding supplement (PRO-STAT SUGAR FREE 64) LIQD Take 30 mLs by mouth 2 (two) times daily.      . ferrous sulfate 325 (65 FE) MG tablet Take 325 mg by mouth daily with breakfast.      . insulin aspart (NOVOLOG) 100 UNIT/ML injection Inject 0-9 Units into the skin 3 (three) times daily with meals. Do not give insulin if CBG is less than 140.  Maintain in range of 140 - 200  1 vial  1  . insulin glargine (LANTUS) 100 UNIT/ML injection Inject 0.08 mLs (8 Units total) into the skin at bedtime.  10 mL  0  . ipratropium (ATROVENT) 0.02 % nebulizer solution Take 500 mcg by nebulization every 2 (two) hours as needed (shortness of breath).      Marland Kitchen levothyroxine (SYNTHROID, LEVOTHROID) 88 MCG tablet Take 88 mcg by mouth daily.      . metoCLOPramide (REGLAN) 5 MG tablet  Take 5 mg by mouth 3 (three) times daily before meals.      . metoprolol tartrate (LOPRESSOR) 25 MG tablet Take 12.5 mg by mouth 2 (two) times daily.      . mirtazapine (REMERON) 15 MG tablet Take 22.5 mg by mouth at bedtime.      Marland Kitchen omeprazole (PRILOSEC) 20 MG capsule Take 20 mg by mouth 2 (two) times daily.      Marland Kitchen oxyCODONE-acetaminophen (ROXICET) 5-325 MG per tablet Take one tablet every 4 hours as needed  180 tablet  0  . pantoprazole sodium (PROTONIX) 40 mg/20 mL PACK Take 40 mg by mouth daily.      . promethazine (PHENERGAN) 25 MG tablet Take 25 mg by mouth every 6 (six) hours as needed for nausea.      . fluconazole (DIFLUCAN) 100 MG tablet Take 1 tablet (100 mg total) by mouth daily.  9 tablet  0  . levofloxacin (LEVAQUIN) 750 MG tablet Take 1 tablet (750 mg total) by mouth daily.  3 tablet  0    Current medications: Infusions: . sodium chloride 75 mL/hr at 12/29/12 1940    Scheduled Medications: . antiseptic oral rinse  15 mL Mouth Rinse q12n4p  . aspirin  81 mg Oral Daily  . chlorhexidine  15 mL Mouth Rinse BID  . fluconazole (DIFLUCAN) IV  100 mg Intravenous Q24H  . insulin aspart  0-9 Units Subcutaneous TID WC  . insulin glargine  8 Units Subcutaneous QHS  . levofloxacin (LEVAQUIN) IV  500 mg Intravenous Q48H  . levothyroxine  44 mcg Intravenous Daily  . metoCLOPramide (REGLAN) injection  5 mg Intravenous TID AC  . mirtazapine  22.5 mg Oral QHS  . pantoprazole (PROTONIX) IV  40 mg Intravenous Q24H  . piperacillin-tazobactam (ZOSYN)  IV  3.375 g Intravenous Q8H    PRN Medications: albuterol, ondansetron (ZOFRAN) IV, oxyCODONE-acetaminophen, RESOURCE THICKENUP CLEAR   Vital Signs: BP 103/48  Pulse 66  Temp(Src) 98.4 F (36.9 C) (Oral)  Resp 18  Ht 5' (1.524 m)  Wt 57.8 kg (127 lb 6.8 oz)  BMI 24.89 kg/m2  SpO2 100%    Labs:  Basic or Comprehensive Metabolic Panel:    Component Value Date/Time   NA 141 12/29/2012 0706   K 4.0 12/29/2012 0706   CL 104  12/29/2012 0706   CO2 30 12/29/2012 0706   BUN 24* 12/29/2012 0706   CREATININE 1.40* 12/29/2012 0706   GLUCOSE 138* 12/29/2012 0706   CALCIUM 8.1* 12/29/2012 0706   AST 10 12/29/2012 0706   ALT <5 12/29/2012 0706   ALKPHOS 92 12/29/2012 0706   BILITOT 0.2* 12/29/2012 0706   PROT 6.5 12/29/2012 0706   ALBUMIN 1.1* 12/29/2012 0706     CBC:    Component Value Date/Time   WBC 1.7* 12/29/2012 0706   HGB 6.6* 12/29/2012 0706   HCT 20.1* 12/29/2012 0706   PLT 345 12/29/2012 0706   MCV 84.8 12/29/2012 0706   NEUTROABS 12.4* 12/28/2012  0727   LYMPHSABS 2.2 12/28/2012 0727   MONOABS 0.6 12/28/2012 0727   EOSABS 0.2 12/28/2012 0727   BASOSABS 0.0 12/28/2012 0727     BNP (last 3 results)  Recent Labs  04/11/12 0420 04/16/12 1020 12/28/12 0727  PROBNP 3477.0* 2493.0* 2684.0*    CBG (last 3)   Recent Labs  12/29/12 1640 12/29/12 2117 12/30/12 0743  GLUCAP 226* 204* 109*    Imaging:  Dg Chest Port 1 View  12/29/2012  *RADIOLOGY REPORT*  Clinical Data: Shortness of breath  PORTABLE CHEST - 1 VIEW  Comparison: 12/28/2012; 12/10/2012; 09/13/2012  Findings: Grossly unchanged cardiac silhouette and mediastinal contours with atherosclerotic calcifications within the thoracic aorta.  Grossly unchanged bilateral medial basilar heterogeneous air space opacities, left greater than right.  No new focal airspace opacities.  Query trace left-sided pleural effusion.  No pneumothorax.  Unchanged bones.  IMPRESSION: Grossly unchanged bilateral heterogeneous air space opacities, left greater than right, worrisome for multifocal infection.  Further evaluation with a PA and lateral chest radiograph may be obtained as clinically indicated.   Original Report Authenticated By: Tacey Ruiz, MD     Other Data:  (2D echo, EKG...)   Educational Materials Given:  DNR: Yes  MOST: No Healthcare Power-of-Attorney: No   Time: 70 minutes Greater than 50%  of this time was spent counseling and coordinating care related  to the above assessment and plan.  Signed by: Edsel Petrin, DO  12/30/2012, 9:15 AM  Please contact Palliative Medicine Team phone at [phone] for questions and concerns.

## 2012-12-29 NOTE — Progress Notes (Signed)
Vancomycin, levaquin and zosyn per pharmacy  Anticoagulation: not indicated due to recent GI-bleed and B/L BKA Infectious Disease: 79 yo male with suspected PNA will be put on vancomycin and zosyn x 8 days. SCr up to 1.4 (CrCl ~29). Patient had received one dose of vancomycin 1g iv x1 at 0836 and zosyn 3.375g iv q8h at 0817 on 04/26. Also on levaquin 750mg  iv q48h  Goal of Therapy:  Vancomycin trough level 15-20 mcg/ml  04/26 Vanco >> 5/4 04/26 Zosyn >> 5/4 04/26 Levaquin >>  Nephrology: SCr 1.4, CrCl ~ 29   Plan:  1) Due to reduction in weight and worsening renal function, discontinue current vancomycin dose (already received 2 doses of 1g: 1 yesterday and 1 today) and draw a random vancomycin level tomorrow at 0800 to reassess dosing 2) Cont Zosyn 3.375g iv q8h (4h infusion)  3) Monitor renal function closely 4) Change levaquin to 500 mg q48h, next dose at 1630 on 04/28

## 2012-12-29 NOTE — Progress Notes (Signed)
Pharmacist Heart Failure Core Measure Documentation  Assessment: [name] has an EF documented as 40% on 04/05/12 by ECHO.  Rationale: Heart failure patients with left ventricular systolic dysfunction (LVSD) and an EF < 40% should be prescribed an angiotensin converting enzyme inhibitor (ACEI) or angiotensin receptor blocker (ARB) at discharge unless a contraindication is documented in the medical record.  This patient is not currently on an ACEI or ARB for HF.  This note is being placed in the record in order to provide documentation that a contraindication to the use of these agents is present for this encounter.  ACE Inhibitor or Angiotensin Receptor Blocker is contraindicated (specify all that apply)  []   ACEI allergy AND ARB allergy []   Angioedema []   Moderate or severe aortic stenosis []   Hyperkalemia [x]   Hypotension []   Renal artery stenosis [x]   Worsening renal function, preexisting renal disease or dysfunction   [name], Tsz-Yin 12/29/2012 11:30 AM

## 2012-12-29 NOTE — Plan of Care (Signed)
Problem: Phase II Progression Outcomes Goal: Progress activity as tolerated unless otherwise ordered Outcome: Completed/Met Date Met:  12/29/12 Made pallative care

## 2012-12-29 NOTE — Progress Notes (Signed)
PATIENT DETAILS Name: [name] Age: 79 y.o. Sex: male Date of Birth: 12-19-1928 Admit Date: 12/28/2012 Admitting Physician Dewayne Shorter Levora Dredge, MD [id number], [facility], MD  Subjective: Very lethargic this am-hardly any urine output overnight. Intermittently hypotensive.  Assessment/Plan: Principal Problem:   Acute respiratory failure with hypoxia -2/2 suspected Aspiration PNA vs HCAP -stable on 4 L of O2 via [place]-on initial presentation required 100 %nrb  Active Problems: PNA -Aspiration vs HCAP -c/w empiric Vanco/Zosyn and Levaquin -CXR shows worsening today -Blood cultures 4/26 neg so far  Severe Sepsis with Hypotension -2/2 HCPA/Aspiration PNA -bolus IVF for hypotension, c/w empiric antibiotics -unfortunately declining-see below  ARF with anuria -2/2 ATN in a setting of severe sepsis -c/w IVF support -dont think he will be a HD candidate  Anemia -significant drop in Hb this am-however no overt bleeding evident -could be from IVF and hemodilution-or from critical illness -daughter and spouse are on the way to the hospital-will transfuse only after talking to family-regarding goals of care  Leukopenia -from sepsis -on empiric antibiotics  Dysphagia -seen by SLP 4/26-on Dys 1 diet -await meeting with family  Chronic systolic heart failure -EF 40 percent with diffuse hypokinesis-last Echo Aug 2013 -clinically compensated  H/o Hypertension -given hypotension-hold all anti-hypertensives-therefore stop Metoprolol  Diabetes mellitus -h/o brittle DM -CBG's stable-c/w SSI  Sacral decubitus ulcer  - Stage II sacral decubitus present on admission. -wound care evaluation   Protein-calorie malnutrition,Severe -Resume nutritional supplementation-when oral intake more stable  Hypothyroidism  - Continue with levothyroxine  End of Life issues -DNR -unfortunately declining-patient with very poor quality of life-bedbound with bilateral BKA.Spoke with daughter over  the phone-she understood the poor overall prognosis, will not escalate care at this time apart from IVF, Antibiotics-family meeting scheduled for 10 am.  -have placed a palliative care consult as well.  Disposition: Remain inpatient  DVT Prophylaxis: None-given recent GI bleed-no pharmacologic prophylaxis-given B/L BKA-no SCD's  Code Status:  DNR  Family Communication Daughter-[name]-4/27  Procedures:  None  CONSULTS:  None   MEDICATIONS: Scheduled Meds: . ipratropium  0.5 mg Nebulization QID   And  . albuterol  2.5 mg Nebulization QID  . aspirin  81 mg Oral Daily  . fluconazole (DIFLUCAN) IV  100 mg Intravenous Q24H  . insulin aspart  0-9 Units Subcutaneous TID WC  . insulin glargine  8 Units Subcutaneous QHS  . levofloxacin (LEVAQUIN) IV  750 mg Intravenous Q48H  . levothyroxine  44 mcg Intravenous Daily  . metoCLOPramide (REGLAN) injection  5 mg Intravenous TID AC  . metoprolol tartrate  12.5 mg Oral BID  . mirtazapine  22.5 mg Oral QHS  . pantoprazole (PROTONIX) IV  40 mg Intravenous Q24H  . piperacillin-tazobactam (ZOSYN)  IV  3.375 g Intravenous Q8H  . vancomycin  1,000 mg Intravenous Q24H   Continuous Infusions:  PRN Meds:.ondansetron (ZOFRAN) IV, oxyCODONE-acetaminophen, RESOURCE THICKENUP CLEAR  Antibiotics: Anti-infectives   Start     Dose/Rate Route Frequency Ordered Stop   12/29/12 1700  fluconazole (DIFLUCAN) IVPB 100 mg     100 mg 50 mL/hr over 60 Minutes Intravenous Every 24 hours 12/28/12 1548     12/29/12 0830  vancomycin (VANCOCIN) IVPB 1000 mg/200 mL premix     1,000 mg 200 mL/hr over 60 Minutes Intravenous Every 24 hours 12/28/12 1133 01/05/13 0829   12/28/12 1700  fluconazole (DIFLUCAN) IVPB 200 mg     200 mg 100 mL/hr over 60 Minutes Intravenous  Once 12/28/12 1548 12/28/12 1923   12/28/12  1430  piperacillin-tazobactam (ZOSYN) IVPB 3.375 g     3.375 g 12.5 mL/hr over 240 Minutes Intravenous Every 8 hours 12/28/12 1133 01/05/13  1429   12/28/12 1230  levofloxacin (LEVAQUIN) IVPB 750 mg     750 mg 100 mL/hr over 90 Minutes Intravenous Every 48 hours 12/28/12 1140     12/28/12 1130  levofloxacin (LEVAQUIN) IVPB 750 mg  Status:  Discontinued     750 mg 100 mL/hr over 90 Minutes Intravenous Every 24 hours 12/28/12 1126 12/28/12 1132   12/28/12 1000  levofloxacin (LEVAQUIN) IVPB 750 mg  Status:  Discontinued     750 mg 100 mL/hr over 90 Minutes Intravenous Every 24 hours 12/28/12 0852 12/28/12 1140   12/28/12 0730  piperacillin-tazobactam (ZOSYN) IVPB 3.375 g     3.375 g 12.5 mL/hr over 240 Minutes Intravenous  Once 12/28/12 0723 12/28/12 0923   12/28/12 0730  vancomycin (VANCOCIN) IVPB 1000 mg/200 mL premix     1,000 mg 200 mL/hr over 60 Minutes Intravenous  Once 12/28/12 0723 12/28/12 0938       PHYSICAL EXAM: Vital signs in last 24 hours: Filed Vitals:   12/28/12 1356 12/28/12 2207 12/29/12 0506 12/29/12 0904  BP: 108/57 84/37 93/48    Pulse: 90 92 78   Temp: 97.5 F (36.4 C) 100.1 F (37.8 C) 98.1 F (36.7 C)   TempSrc: Oral Axillary Oral   Resp: 18 18 18    Height:      Weight:      SpO2: 96% 100% 100% 100%    Weight change:  Filed Weights   12/28/12 1127  Weight: 57.8 kg (127 lb 6.8 oz)   Body mass index is 24.89 kg/(m^2).   Gen Exam: Awake but very lethargiv Neck: Supple, No JVD.   Chest: B/Lrales CVS: S1 S2 Regular Abdomen: soft, BS +, non tender, non distended. Extremities: B/L BKA Neurologic: Non Focal-but with gen. weakness Skin: No Rash.  Wounds: N/A.    Intake/Output from previous day:  Intake/Output Summary (Last 24 hours) at 12/29/12 0923 Last data filed at 12/28/12 1946  Gross per 24 hour  Intake 585.83 ml  Output    400 ml  Net 185.83 ml     LAB RESULTS: CBC  Recent Labs Lab 12/28/12 0727 12/28/12 0752 12/29/12 0706  WBC 15.4*  --  1.7*  HGB 8.4* 9.2* 6.6*  HCT 26.6* 27.0* 20.1*  PLT 484*  --  345  MCV 88.1  --  84.8  MCH 27.8  --  27.8  MCHC 31.6  --   32.8  RDW 17.7*  --  17.5*  LYMPHSABS 2.2  --   --   MONOABS 0.6  --   --   EOSABS 0.2  --   --   BASOSABS 0.0  --   --     Chemistries   Recent Labs Lab 12/28/12 0727 12/28/12 0752 12/29/12 0706  NA 138 139 141  K 3.8 3.9 4.0  CL 97 100 104  CO2 24  --  30  GLUCOSE 347* 342* 138*  BUN 18 17 24*  CREATININE 0.84 1.10 1.40*  CALCIUM 8.9  --  8.1*    CBG:  Recent Labs Lab 12/28/12 1138 12/28/12 1653 12/28/12 2204 12/29/12 0752  GLUCAP 277* 328* 201* 122*    GFR Estimated Creatinine Clearance: 28.8 ml/min (by C-G formula based on Cr of 1.4).  Coagulation profile  Recent Labs Lab 12/28/12 0727  INR 1.30    Cardiac Enzymes No results found  for this basename: CK, CKMB, TROPONINI, MYOGLOBIN,  in the last 168 hours  No components found with this basename: POCBNP,  No results found for this basename: DDIMER,  in the last 72 hours No results found for this basename: HGBA1C,  in the last 72 hours No results found for this basename: CHOL, HDL, LDLCALC, TRIG, CHOLHDL, LDLDIRECT,  in the last 72 hours No results found for this basename: TSH, T4TOTAL, FREET3, T3FREE, THYROIDAB,  in the last 72 hours No results found for this basename: VITAMINB12, FOLATE, FERRITIN, TIBC, IRON, RETICCTPCT,  in the last 72 hours No results found for this basename: LIPASE, AMYLASE,  in the last 72 hours  Urine Studies No results found for this basename: UACOL, UAPR, USPG, UPH, UTP, UGL, UKET, UBIL, UHGB, UNIT, UROB, ULEU, UEPI, UWBC, URBC, UBAC, CAST, CRYS, UCOM, BILUA,  in the last 72 hours  MICROBIOLOGY: Recent Results (from the past 240 hour(s))  CULTURE, BLOOD (ROUTINE X 2)     Status: None   Collection Time    12/28/12  7:20 AM      Result Value Range Status   Specimen Description BLOOD LEFT ARM   Final   Special Requests BOTTLES DRAWN AEROBIC ONLY 3CC   Final   Culture  Setup Time 12/28/2012 13:02   Final   Culture     Final   Value:        BLOOD CULTURE RECEIVED NO GROWTH TO  DATE CULTURE WILL BE HELD FOR 5 DAYS BEFORE ISSUING A FINAL NEGATIVE REPORT   Report Status PENDING   Incomplete  CULTURE, BLOOD (ROUTINE X 2)     Status: None   Collection Time    12/28/12  7:30 AM      Result Value Range Status   Specimen Description BLOOD RIGHT HAND   Final   Special Requests BOTTLES DRAWN AEROBIC ONLY 3CC   Final   Culture  Setup Time 12/28/2012 13:02   Final   Culture     Final   Value:        BLOOD CULTURE RECEIVED NO GROWTH TO DATE CULTURE WILL BE HELD FOR 5 DAYS BEFORE ISSUING A FINAL NEGATIVE REPORT   Report Status PENDING   Incomplete    RADIOLOGY STUDIES/RESULTS: Dg Chest Port 1 View  12/29/2012  *RADIOLOGY REPORT*  Clinical Data: Shortness of breath  PORTABLE CHEST - 1 VIEW  Comparison: 12/28/2012; 12/10/2012; 09/13/2012  Findings: Grossly unchanged cardiac silhouette and mediastinal contours with atherosclerotic calcifications within the thoracic aorta.  Grossly unchanged bilateral medial basilar heterogeneous air space opacities, left greater than right.  No new focal airspace opacities.  Query trace left-sided pleural effusion.  No pneumothorax.  Unchanged bones.  IMPRESSION: Grossly unchanged bilateral heterogeneous air space opacities, left greater than right, worrisome for multifocal infection.  Further evaluation with a PA and lateral chest radiograph may be obtained as clinically indicated.   Original Report Authenticated By: Tacey Ruiz, MD    Dg Chest Portable 1 View  12/28/2012  *RADIOLOGY REPORT*  Clinical Data: Respiratory distress  PORTABLE CHEST - 1 VIEW  Comparison: 12/10/2012; 04/26/2012; 04/20/2012  Findings: Grossly unchanged cardiac silhouette and mediastinal contours.  Interval development of heterogeneous air space opacity in the right mid and lower lung. Worsening retrocardiac opacities, possibly representing atelectasis.  There is persistent mild elevation of the right hemidiaphragm.  No definite pleural effusion or pneumothorax.  No definite  evidence of edema.  Unchanged bones.  IMPRESSION: Ill-defined heterogeneous air space opacities within the  right mid and lower lung worrisome for infection, less likely asymmetric pulmonary edema.  Further evaluation with a PA and lateral chest radiograph may be obtained as clinically indicated.   Original Report Authenticated By: Tacey Ruiz, MD    Dg Chest Port 1 View  12/10/2012  *RADIOLOGY REPORT*  Clinical Data: No bleed.  PORTABLE CHEST - 1 VIEW  Comparison: 09/13/2012  Findings: No infiltrates, edema or pleural effusions.  Subtle area of potential nodularity is seen in the mid right lung.  This may relate to a rib or calcified pleural plaque.  Recommend performing a PA and lateral study in follow-up.  The heart size is stable and within normal limits.  IMPRESSION: No active disease.  Possible subtle nodularity in the right mid lung.  Recommend initial performance of a PA and lateral study in follow-up.   Original Report Authenticated By: Irish Lack, M.D.     Jeoffrey Massed, MD  [facility] Pager:[phone]  If 7PM-7AM, please contact night-coverage [email or web address] Password [facility] 12/29/2012, 9:23 AM   LOS: 1 day

## 2012-12-30 DIAGNOSIS — D62 Acute posthemorrhagic anemia: Secondary | ICD-10-CM

## 2012-12-30 LAB — LEGIONELLA ANTIGEN, URINE: Legionella Antigen, Urine: NEGATIVE

## 2012-12-30 LAB — BASIC METABOLIC PANEL
BUN: 27 mg/dL — ABNORMAL HIGH (ref 6–23)
Creatinine, Ser: 2.2 mg/dL — ABNORMAL HIGH (ref 0.50–1.35)
GFR calc Af Amer: 30 mL/min — ABNORMAL LOW (ref >90)
GFR calc non Af Amer: 26 mL/min — ABNORMAL LOW (ref >90)

## 2012-12-30 LAB — VANCOMYCIN, RANDOM: Vancomycin Rm: 18 ug/mL

## 2012-12-30 MED ORDER — CHLORHEXIDINE GLUCONATE 0.12 % MT SOLN
15.0000 mL | Freq: Two times a day (BID) | OROMUCOSAL | Status: DC
  Administered 2012-12-30: 15 mL via OROMUCOSAL
  Filled 2012-12-30 (×3): qty 15

## 2012-12-30 MED ORDER — SCOPOLAMINE 1 MG/3DAYS TD PT72: 1.0000 | MEDICATED_PATCH | TRANSDERMAL | Status: AC

## 2012-12-30 MED ORDER — INSULIN ASPART 100 UNIT/ML ~~LOC~~ SOLN: 0.0000 [IU] | Freq: Three times a day (TID) | SUBCUTANEOUS | Status: AC

## 2012-12-30 MED ORDER — MORPHINE SULFATE (CONCENTRATE) 10 MG /0.5 ML PO SOLN: 5.0000 mg | ORAL | Status: AC | PRN

## 2012-12-30 MED ORDER — BIOTENE DRY MOUTH MT LIQD: 15.0000 mL | Freq: Two times a day (BID) | OROMUCOSAL | Status: DC

## 2012-12-30 MED ORDER — LORAZEPAM 0.5 MG PO TABS: 1.0000 mg | ORAL_TABLET | Freq: Four times a day (QID) | ORAL | Status: AC | PRN

## 2012-12-30 MED ORDER — ALBUTEROL SULFATE (5 MG/ML) 0.5% IN NEBU: 2.5000 mg | INHALATION_SOLUTION | RESPIRATORY_TRACT | Status: AC | PRN

## 2012-12-30 NOTE — Consult Note (Signed)
[facility] Liaison: Confirmed request for [facility] with CSW and RNCM, received report from PMT MD, chart reviewed. Met with patient alone followed by phone conversation with daughter [name] followed by meeting with Mr. And Mrs. Katrinka Blazing and g-dtr Raoul Pitch to complete paper work for transfer to [facility] today. Dr. Barbee Shropshire to assume care at [facility] per family request. Will need DC summary faxed to [phone] and RN to call report to [phone]. Thank you. Forrestine Him LCSW [phone]

## 2012-12-30 NOTE — Care Management Note (Signed)
    Page 1 of 1   12/30/2012     11:47:46 AM   CARE MANAGEMENT NOTE 12/30/2012  Patient:  Gockley,Mc   Account Number:  [id number]  Date Initiated:  12/30/2012  Documentation initiated by:  Letha Cape  Subjective/Objective Assessment:   dx acute resp failure  admit- from [facility].     Action/Plan:   palliative meeting   Anticipated DC Date:  12/30/2012   Anticipated DC Plan:  HOSPICE MEDICAL FACILITY  In-house referral  Clinical Social Worker      DC Planning Services  CM consult      [facility] Choice  HOSPICE   Choice offered to / List presented to:  C-1 Patient           Status of service:  Completed, signed off Medicare Important Message given?   (If response is "NO", the following Medicare IM given date fields will be blank) Date Medicare IM given:   Date Additional Medicare IM given:    Discharge Disposition:  HOSPICE MEDICAL FACILITY  Per UR Regulation:  Reviewed for med. necessity/level of care/duration of stay  If discussed at Long Length of Stay Meetings, dates discussed:    Comments:  12/30/12 11:45 Letha Cape RN, BSN [phone] patient lives alone, palliative care meeting, decision is for residential hospice, CSW aware,[facility] choices, Carley Hammed informedd.  Patient for dc to [facility] today.

## 2012-12-30 NOTE — Plan of Care (Signed)
Problem: Phase II Progression Outcomes Goal: Discharge plan established Outcome: Completed/Met Date Met:  12/30/12 Residential hospice  Problem: Phase III Progression Outcomes Goal: O2 sats > or equal to 93% on room air Outcome: Progressing 100% 2L [place]

## 2012-12-30 NOTE — Progress Notes (Signed)
Nutrition Brief Note  RD pulled to pt for malnutrition screening tool report of unintentional weight loss. Chart reviewed. Pt now transitioning to comfort care with comfort feedings. Plan to d/c to residential hospice.  No nutrition interventions warranted at this time.  Please consult as needed.   Clarene Duke RD, LDN Pager [phone] After Hours pager [phone]

## 2012-12-30 NOTE — Discharge Summary (Signed)
Physician Discharge Summary  [name] ZOX:[id number] DOB: Oct 29, 1928 DOA: 12/28/2012  PCP: [name], MD  Admit date: 12/28/2012 Discharge date: 12/30/2012  Time spent: 45 minutes  Recommendations for Outpatient Follow-up:  Patient to go to Residential Hospice at [facility] for comfort care.  Discharge Diagnoses:  Principal Problem:   Acute respiratory failure with hypoxia Active Problems:   Hypertension   Diabetes mellitus   Hypothyroidism   Anemia   Sacral decubitus ulcer   Protein-calorie malnutrition, moderate   HCAP (healthcare-associated pneumonia)   Discharge Condition: poor prognosis.  Diet recommendation: comfort feedings, Aspiration precautions  Filed Weights   12/28/12 1127  Weight: 57.8 kg (127 lb 6.8 oz)    History of present illness:  [name] is a 79 y.o. male with a Past Medical History of recent GI bleed, bilateral BKA, brittle diabetes who presents today with the above noted complaint. Per patient, he was in his usual state of health last evening, this morning he noted he was short of breath. Per nursing home documentation, patient was in acute respiratory distress and profoundly hypoxic requiring 100% nonrebreather mask to maintain O2 saturations. Patient was then transferred urgently to the emergency room, x-ray suggested aspiration pneumonia. Patient was then given supportive care, IV antibiotics, nebulized bronchodilators, he was then able to be transitioned from 100% nonrebreather mask to nasal cannula. I was then asked to admit this patient for further evaluation and treatment. During my evaluation, patient appeared very comfortable, we will able to titrate his oxygen down to just 3-4 L per minute. he is now being admitted for further evaluation.   Hospital Course:  Acute respiratory failure with hypoxia  Secondary to Aspiration PNA. On initial presentation required 100 %nrb  Now stable on 4 L of O2 via [place].     PNA  Probable  Aspiration vs HCAP  He was treated with Zosyn, Vancomycin, Levaquin.  Unfortunately his chest xray continued to become worse.  After multiple discussions with the family, Antibiotic have been discontinued.  Blood cultures 4/26 neg so far.  Severe Sepsis with Hypotension  Secondary to Aspiration PNA.  He received bolus IVF for hypotension and empiric antibiotics.  Unfortunately declined despite these efforts.  ARF with anuria  Secondary to ATN in a setting of severe sepsis.  He was treated with IVF support.  He was not considered an HD candidate.  Blood draws were discontinued.  Urine output has dwindled significantly.  Anemia  Significant drop in Hb to 6.6 with overt bleeding evident.  Could be from IVF and hemodilution or from critical illness.  After discussions with the family the decision was made not to transfuse.  Leukopenia  Patient dropped from 15.4 to 1.7 due to sepsis.    Dysphagia  Seen by SLP 4/26-on Dys 1 diet (Pureed) with comfort feedings.   Chronic systolic heart failure  EF 40 percent with diffuse hypokinesis-last Echo Aug 2013.  Clinically compensated.   H/o Hypertension  Given hypotension-hold all anti-hypertensives-therefore stop Metoprolol.  Diabetes mellitus Brittle DM.  CBG's stable.  Will continue basil insulin and SSI when eating.  Sacral decubitus ulcer  Stage II sacral decubitus present on admission.  Wound care evaluation completed.  Protein-calorie malnutrition,Severe  Resume nutritional supplementation-when oral intake more stable   Hypothyroidism  Continue with levothyroxine   End of Life issues  DNR.  Unfortunately declining.  Patient with very poor quality of life:  bedbound with bilateral BKA.  Palliative medicine consultation was held.  Palliative medicine and the family  determined that comfort is the primary goal.  Aggressive symptom management The patient is eligible for residential hospice.  Consultations: Palliative  Medicine    Discharge Exam: Filed Vitals:   12/29/12 1352 12/29/12 2049 12/30/12 0033 12/30/12 0556  BP: 85/40 100/47 100/39 103/48  Pulse: 78 75 70 66  Temp: 98.3 F (36.8 C) 98.1 F (36.7 C) 98.1 F (36.7 C) 98.4 F (36.9 C)  TempSrc: Oral Axillary Axillary Oral  Resp: 18   18  Height:      Weight:      SpO2: 0% 100% 100% 100%   General:  Awake, Alert, Appears comfortable. Heent:  Sclera clear, conjunctiva pink,  Pupils equal and round Neck:  Supple with out obvious lymphadenopathy Chest:  Patient does not inspire deeply, but no w/c/r are heard CV:  RRR no obvious m/r/g Abdomen:  Soft, nt, nd, +BS Extremities:  Bilateral BKAs    Discharge Instructions      Discharge Orders   Future Orders Complete By Expires     Diet general  As directed     Comments:      Comfort feedings, pureed foods, thickened liquids, aspiration precautions.    Increase activity slowly  As directed         Medication List    STOP taking these medications       albuterol 108 (90 BASE) MCG/ACT inhaler  Commonly known as:  PROVENTIL HFA;VENTOLIN HFA     aspirin 81 MG chewable tablet     feeding supplement Liqd     ferrous sulfate 325 (65 FE) MG tablet     levofloxacin 750 MG tablet  Commonly known as:  LEVAQUIN     levothyroxine 88 MCG tablet  Commonly known as:  SYNTHROID, LEVOTHROID     metoprolol tartrate 25 MG tablet  Commonly known as:  LOPRESSOR     omeprazole 20 MG capsule  Commonly known as:  PRILOSEC     oxyCODONE-acetaminophen 5-325 MG per tablet  Commonly known as:  ROXICET     pantoprazole sodium 40 mg/20 mL Pack  Commonly known as:  PROTONIX      TAKE these medications       albuterol (5 MG/ML) 0.5% nebulizer solution  Commonly known as:  PROVENTIL  Take 0.5 mLs (2.5 mg total) by nebulization every 4 (four) hours as needed for wheezing or shortness of breath.     docusate sodium 100 MG capsule  Commonly known as:  COLACE  Take 100 mg by mouth 2 (two)  times daily.     fluconazole 100 MG tablet  Commonly known as:  DIFLUCAN  Take 1 tablet (100 mg total) by mouth daily.     insulin aspart 100 UNIT/ML injection  Commonly known as:  novoLOG  Inject 0-9 Units into the skin 3 (three) times daily with meals. Do not give insulin if CBG is less than 140.   Maintain in range of 140 - 200.  D/C if patient is not eating.     insulin glargine 100 UNIT/ML injection  Commonly known as:  LANTUS  Inject 0.08 mLs (8 Units total) into the skin at bedtime.     ipratropium 0.02 % nebulizer solution  Commonly known as:  ATROVENT  Take 500 mcg by nebulization every 2 (two) hours as needed (shortness of breath).     LORazepam 0.5 MG tablet  Commonly known as:  ATIVAN  Take 2 tablets (1 mg total) by mouth every 6 (six) hours as needed for  anxiety.     metoCLOPramide 5 MG tablet  Commonly known as:  REGLAN  Take 5 mg by mouth 3 (three) times daily before meals.     mirtazapine 15 MG tablet  Commonly known as:  REMERON  Take 22.5 mg by mouth at bedtime.     morphine CONCENTRATE 10 mg / 0.5 ml concentrated solution  Take 0.25 mLs (5 mg total) by mouth every 2 (two) hours as needed for pain.     promethazine 25 MG tablet  Commonly known as:  PHENERGAN  Take 25 mg by mouth every 6 (six) hours as needed for nausea.     scopolamine 1.5 MG  Commonly known as:  TRANSDERM-SCOP  Place 1 patch (1.5 mg total) onto the skin every 3 (three) days.          The results of significant diagnostics from this hospitalization (including imaging, microbiology, ancillary and laboratory) are listed below for reference.    Significant Diagnostic Studies: Dg Chest Port 1 View  12/29/2012  *RADIOLOGY REPORT*  Clinical Data: Shortness of breath  PORTABLE CHEST - 1 VIEW  Comparison: 12/28/2012; 12/10/2012; 09/13/2012  Findings: Grossly unchanged cardiac silhouette and mediastinal contours with atherosclerotic calcifications within the thoracic aorta.  Grossly  unchanged bilateral medial basilar heterogeneous air space opacities, left greater than right.  No new focal airspace opacities.  Query trace left-sided pleural effusion.  No pneumothorax.  Unchanged bones.  IMPRESSION: Grossly unchanged bilateral heterogeneous air space opacities, left greater than right, worrisome for multifocal infection.  Further evaluation with a PA and lateral chest radiograph may be obtained as clinically indicated.   Original Report Authenticated By: Tacey Ruiz, MD    Dg Chest Portable 1 View  12/28/2012  *RADIOLOGY REPORT*  Clinical Data: Respiratory distress  PORTABLE CHEST - 1 VIEW  Comparison: 12/10/2012; 04/26/2012; 04/20/2012  Findings: Grossly unchanged cardiac silhouette and mediastinal contours.  Interval development of heterogeneous air space opacity in the right mid and lower lung. Worsening retrocardiac opacities, possibly representing atelectasis.  There is persistent mild elevation of the right hemidiaphragm.  No definite pleural effusion or pneumothorax.  No definite evidence of edema.  Unchanged bones.  IMPRESSION: Ill-defined heterogeneous air space opacities within the right mid and lower lung worrisome for infection, less likely asymmetric pulmonary edema.  Further evaluation with a PA and lateral chest radiograph may be obtained as clinically indicated.   Original Report Authenticated By: Tacey Ruiz, MD    Dg Chest Port 1 View  12/10/2012  *RADIOLOGY REPORT*  Clinical Data: No bleed.  PORTABLE CHEST - 1 VIEW  Comparison: 09/13/2012  Findings: No infiltrates, edema or pleural effusions.  Subtle area of potential nodularity is seen in the mid right lung.  This may relate to a rib or calcified pleural plaque.  Recommend performing a PA and lateral study in follow-up.  The heart size is stable and within normal limits.  IMPRESSION: No active disease.  Possible subtle nodularity in the right mid lung.  Recommend initial performance of a PA and lateral study in  follow-up.   Original Report Authenticated By: Irish Lack, M.D.     Microbiology: Recent Results (from the past 240 hour(s))  CULTURE, BLOOD (ROUTINE X 2)     Status: None   Collection Time    12/28/12  7:20 AM      Result Value Range Status   Specimen Description BLOOD LEFT ARM   Final   Special Requests BOTTLES DRAWN AEROBIC ONLY 3CC  Final   Culture  Setup Time 12/28/2012 13:02   Final   Culture     Final   Value:        BLOOD CULTURE RECEIVED NO GROWTH TO DATE CULTURE WILL BE HELD FOR 5 DAYS BEFORE ISSUING A FINAL NEGATIVE REPORT   Report Status PENDING   Incomplete  CULTURE, BLOOD (ROUTINE X 2)     Status: None   Collection Time    12/28/12  7:30 AM      Result Value Range Status   Specimen Description BLOOD RIGHT HAND   Final   Special Requests BOTTLES DRAWN AEROBIC ONLY 3CC   Final   Culture  Setup Time 12/28/2012 13:02   Final   Culture     Final   Value:        BLOOD CULTURE RECEIVED NO GROWTH TO DATE CULTURE WILL BE HELD FOR 5 DAYS BEFORE ISSUING A FINAL NEGATIVE REPORT   Report Status PENDING   Incomplete  URINE CULTURE     Status: None   Collection Time    12/28/12  4:23 PM      Result Value Range Status   Specimen Description URINE, RANDOM   Final   Special Requests NONE   Final   Culture  Setup Time 12/28/2012 21:26   Final   Colony Count >=100,000 COLONIES/ML   Final   Culture GRAM NEGATIVE RODS   Final   Report Status PENDING   Incomplete     Labs: Basic Metabolic Panel:  Recent Labs Lab 12/28/12 0727 12/28/12 0752 12/29/12 0706  NA 138 139 141  K 3.8 3.9 4.0  CL 97 100 104  CO2 24  --  30  GLUCOSE 347* 342* 138*  BUN 18 17 24*  CREATININE 0.84 1.10 1.40*  CALCIUM 8.9  --  8.1*   Liver Function Tests:  Recent Labs Lab 12/28/12 0727 12/29/12 0706  AST 12 10  ALT <5 <5  ALKPHOS 115 92  BILITOT 0.2* 0.2*  PROT 7.9 6.5  ALBUMIN 1.4* 1.1*   CBC:  Recent Labs Lab 12/28/12 0727 12/28/12 0752 12/29/12 0706  WBC 15.4*  --  1.7*   NEUTROABS 12.4*  --   --   HGB 8.4* 9.2* 6.6*  HCT 26.6* 27.0* 20.1*  MCV 88.1  --  84.8  PLT 484*  --  345    BNP: BNP (last 3 results)  Recent Labs  04/11/12 0420 04/16/12 1020 12/28/12 0727  PROBNP 3477.0* 2493.0* 2684.0*   CBG:  Recent Labs Lab 12/29/12 0752 12/29/12 1207 12/29/12 1640 12/29/12 2117 12/30/12 0743  GLUCAP 122* 139* 226* 204* 109*       Signed:  Stephani Police, PA-C [facility] 12/30/2012, 11:51 AM  Attending Patient seen and examined, and with the above assessment and plan. Unfortunately continues to decline, worsening renal function. I had a long discussion with the patient and his daughter at bedside, we agreed to stop all antibiotics, patient's daughter wanted one last set of labs-shows worsening renal function. Both patient and daughter are agreeable to transfer to be complaints. He also had one episode of obvious aspiration when his daughter was treating him this morning.  S Ghimire

## 2012-12-30 NOTE — Consult Note (Signed)
WOC consult Note Reason for Consult: Consult requested for bilat stumps and back. Pt has been followed by Dr Lajoyce Corners in the past for bilat amputations. Dr in room discussing possible comfort care goals with pt and daughter at bedside. Wound type:  Lower back over spinal area with partial thickness abrasion, appears R/T chronic shear from scooting up in bed.  .5X.5X.1cm, 100% red, pink and dry.  Appearance is NOT consistent with a pressure ulcer, is NOT located over sacrum.  Daughter at bedside to assess all wounds during consult and discuss plan of care.  Right outer stump with partial thickness wound .2X.2X.2cm, pink and dry without odor or drainage.    Left lower stump with full thickness scabbed areas.  4X1X.1cm and 5.5X1X.1cm, both 50% red and moist, 50% dry scabbed areas, no odor, small yellow drainage. Dressing procedure/placement/frequency: Foam dressings to protect wounds and decrease discomfort with dressing changes. Please re-consult if further assistance is needed.  Thank-you,  Cammie Mcgee MSN, RN, CWOCN, [place], CNS [phone]

## 2012-12-30 NOTE — Clinical Social Work Psychosocial (Signed)
     Clinical Social Work Department BRIEF PSYCHOSOCIAL ASSESSMENT 12/30/2012  Patient:  [name],[name]     Account Number:  [id number]     Admit date:  12/28/2012  Clinical Social Worker:  Hulan Fray  Date/Time:  12/30/2012 10:51 AM  Referred by:  Physician  Date Referred:  12/29/2012 Referred for  Residential hospice placement   Other Referral:   Interview type:  Other - See comment Other interview type:   chart review  Daughter- [name]    PSYCHOSOCIAL DATA Living Status:  WIFE Admitted from facility:   Level of care:   Primary support name:  [name] Primary support relationship to patient:  SPOUSE Degree of support available:   supportive    CURRENT CONCERNS Current Concerns  Post-Acute Placement   Other Concerns:    SOCIAL WORK ASSESSMENT / PLAN Clinical Social Worker received referral for residential placement and preference for [facility]. . CSW spoke with [facility] liason regarding referral. CSW spoke with daughter who confirmed transfer to [facility] today. CSW will sign off, as social work intervention is no longer needed.   Assessment/plan status:  No Further Intervention Required Other assessment/ plan:   Information/referral to community resources:   Patient to be transferred to [facility] today.    PATIENTS/FAMILYS RESPONSE TO PLAN OF CARE: Family is agreeable to [facility] transfer today.

## 2012-12-30 NOTE — Progress Notes (Signed)
Spoke with patient and daughter-explained patient still did not have any significant urine output. Discussed whether or not to stop antibiotics, patient and daughter both are willing to go ahead and stop all antibiotics, he just wants [place] to order one set of chemistries for one last time.

## 2012-12-30 NOTE — Progress Notes (Signed)
NURSING PROGRESS NOTE  [name] [id number] Discharge Data: 12/30/2012 1:33 PM Attending Provider: Maretta Bees, MD [id number], Toniann Fail, MD     [name] to be D/C'd Skilled nursing facility per MD order.  All IV's discontinued with no bleeding noted. All belongings returned to patient for patient to take home.   Last Vital Signs:  Blood pressure 103/48, pulse 66, temperature 98.4 F (36.9 C), temperature source Oral, resp. rate 18, height 5' (1.524 m), weight 57.8 kg (127 lb 6.8 oz), SpO2 100.00%.  Discharge Medication List   Medication List    STOP taking these medications       albuterol 108 (90 BASE) MCG/ACT inhaler  Commonly known as:  PROVENTIL HFA;VENTOLIN HFA     aspirin 81 MG chewable tablet     feeding supplement Liqd     ferrous sulfate 325 (65 FE) MG tablet     levofloxacin 750 MG tablet  Commonly known as:  LEVAQUIN     levothyroxine 88 MCG tablet  Commonly known as:  SYNTHROID, LEVOTHROID     metoprolol tartrate 25 MG tablet  Commonly known as:  LOPRESSOR     omeprazole 20 MG capsule  Commonly known as:  PRILOSEC     oxyCODONE-acetaminophen 5-325 MG per tablet  Commonly known as:  ROXICET     pantoprazole sodium 40 mg/20 mL Pack  Commonly known as:  PROTONIX      TAKE these medications       albuterol (5 MG/ML) 0.5% nebulizer solution  Commonly known as:  PROVENTIL  Take 0.5 mLs (2.5 mg total) by nebulization every 4 (four) hours as needed for wheezing or shortness of breath.     docusate sodium 100 MG capsule  Commonly known as:  COLACE  Take 100 mg by mouth 2 (two) times daily.     fluconazole 100 MG tablet  Commonly known as:  DIFLUCAN  Take 1 tablet (100 mg total) by mouth daily.     insulin aspart 100 UNIT/ML injection  Commonly known as:  novoLOG  Inject 0-9 Units into the skin 3 (three) times daily with meals. Do not give insulin if CBG is less than 140.   Maintain in range of 140 - 200.  D/C if patient is not eating.     insulin glargine 100 UNIT/ML injection  Commonly known as:  LANTUS  Inject 0.08 mLs (8 Units total) into the skin at bedtime.     ipratropium 0.02 % nebulizer solution  Commonly known as:  ATROVENT  Take 500 mcg by nebulization every 2 (two) hours as needed (shortness of breath).     LORazepam 0.5 MG tablet  Commonly known as:  ATIVAN  Take 2 tablets (1 mg total) by mouth every 6 (six) hours as needed for anxiety.     metoCLOPramide 5 MG tablet  Commonly known as:  REGLAN  Take 5 mg by mouth 3 (three) times daily before meals.     mirtazapine 15 MG tablet  Commonly known as:  REMERON  Take 22.5 mg by mouth at bedtime.     morphine CONCENTRATE 10 mg / 0.5 ml concentrated solution  Take 0.25 mLs (5 mg total) by mouth every 2 (two) hours as needed for pain.     promethazine 25 MG tablet  Commonly known as:  PHENERGAN  Take 25 mg by mouth every 6 (six) hours as needed for nausea.     scopolamine 1.5 MG  Commonly known as:  TRANSDERM-SCOP  Place 1  patch (1.5 mg total) onto the skin every 3 (three) days.        Madelin Rear RN, MSN, [employer]

## 2013-01-02 LAB — URINE CULTURE

## 2013-01-03 LAB — CULTURE, BLOOD (ROUTINE X 2): Culture: NO GROWTH

## 2013-02-02 DEATH — deceased

## 2013-06-29 IMAGING — CR DG ABD PORTABLE 1V
1 series · 1 of 1 positions shown · non-contrast
Comparison: 07/18/2008

CLINICAL DATA: Feeding tube placement

PORTABLE ABDOMEN - 1 VIEW

[ap (kub)]
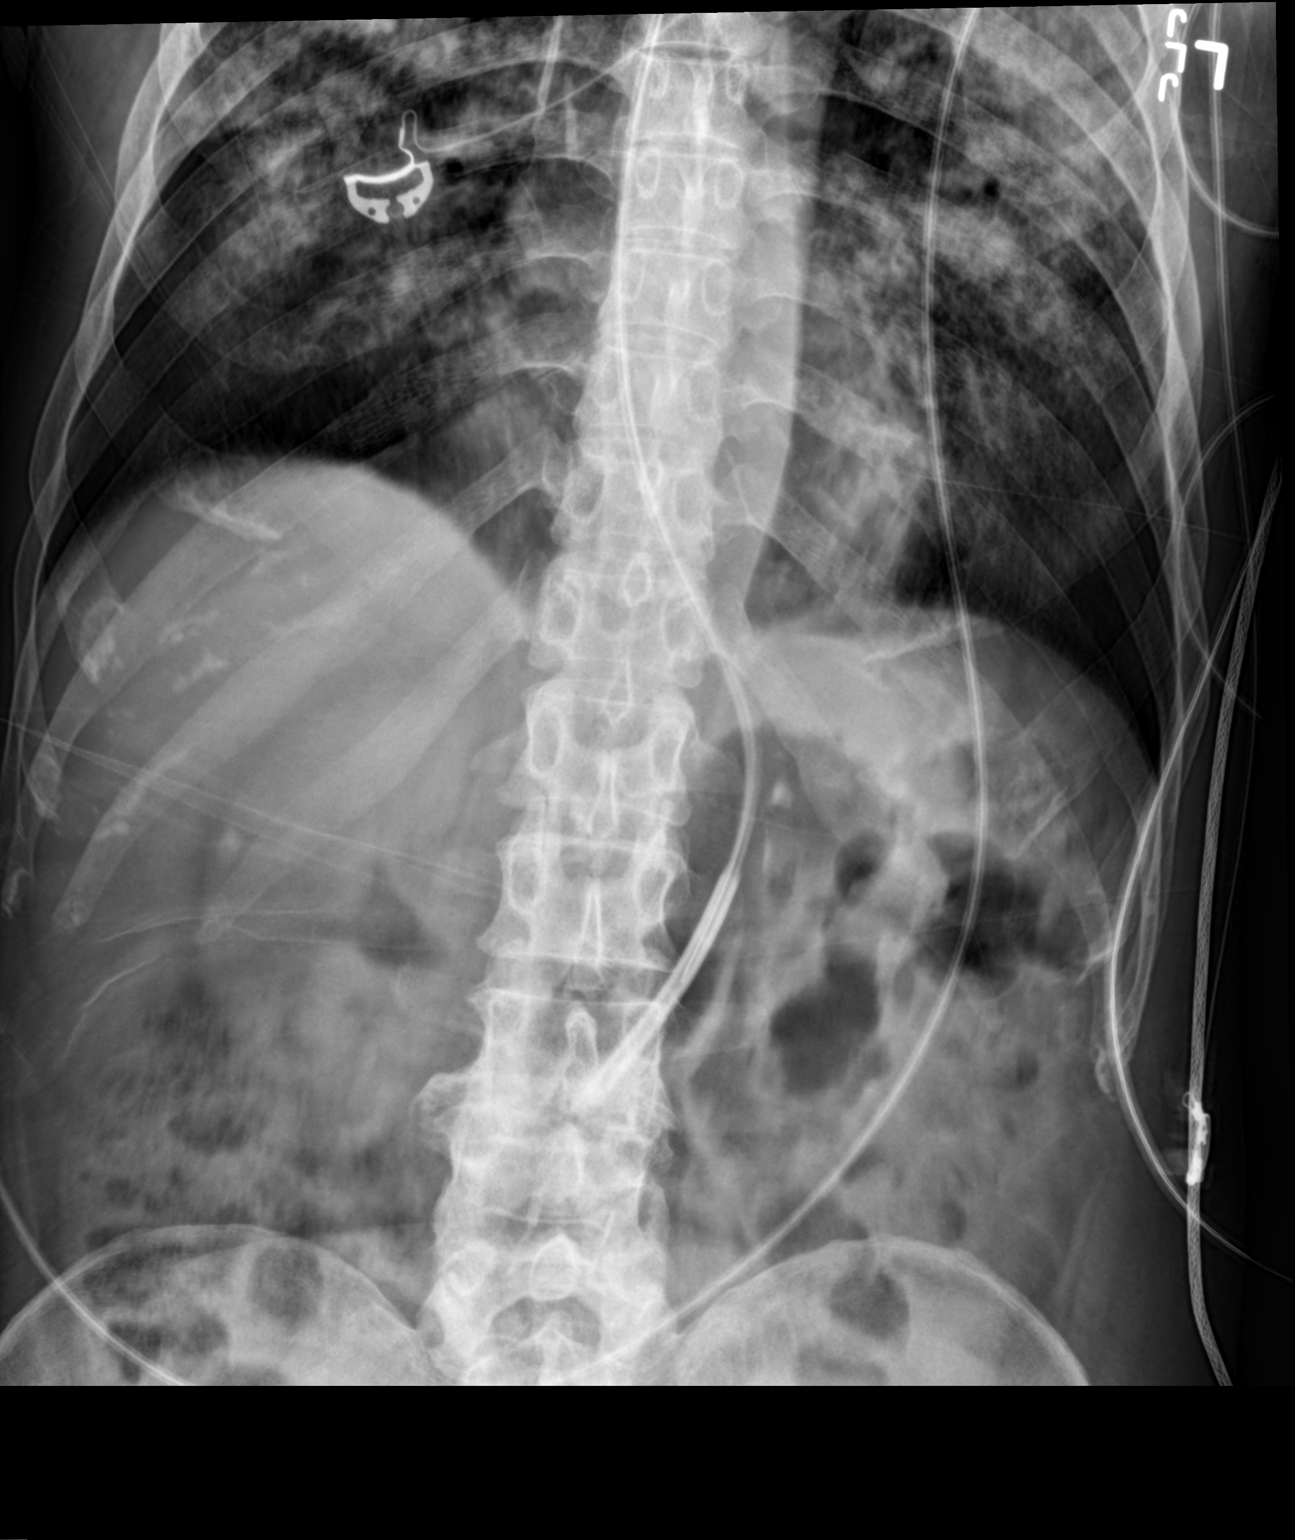

[1 of 1 positions shown; findings below may reference images not displayed]

FINDINGS: Panda type feeding tube extends to the body of the
stomach.  The visualized bowel gas pattern is unremarkable.  Patchy
airspace opacities are noted in the visualized lower lung fields,
detailed degraded by motion.
IMPRESSION: 1.  Feeding tube to a gastric body.

## 2013-06-30 IMAGING — CR DG CHEST 1V PORT
1 series · 1 of 1 positions shown · non-contrast
Comparison: 04/08/2012

CLINICAL DATA: Check ET tube placement.

PORTABLE CHEST - 1 VIEW

[AP]
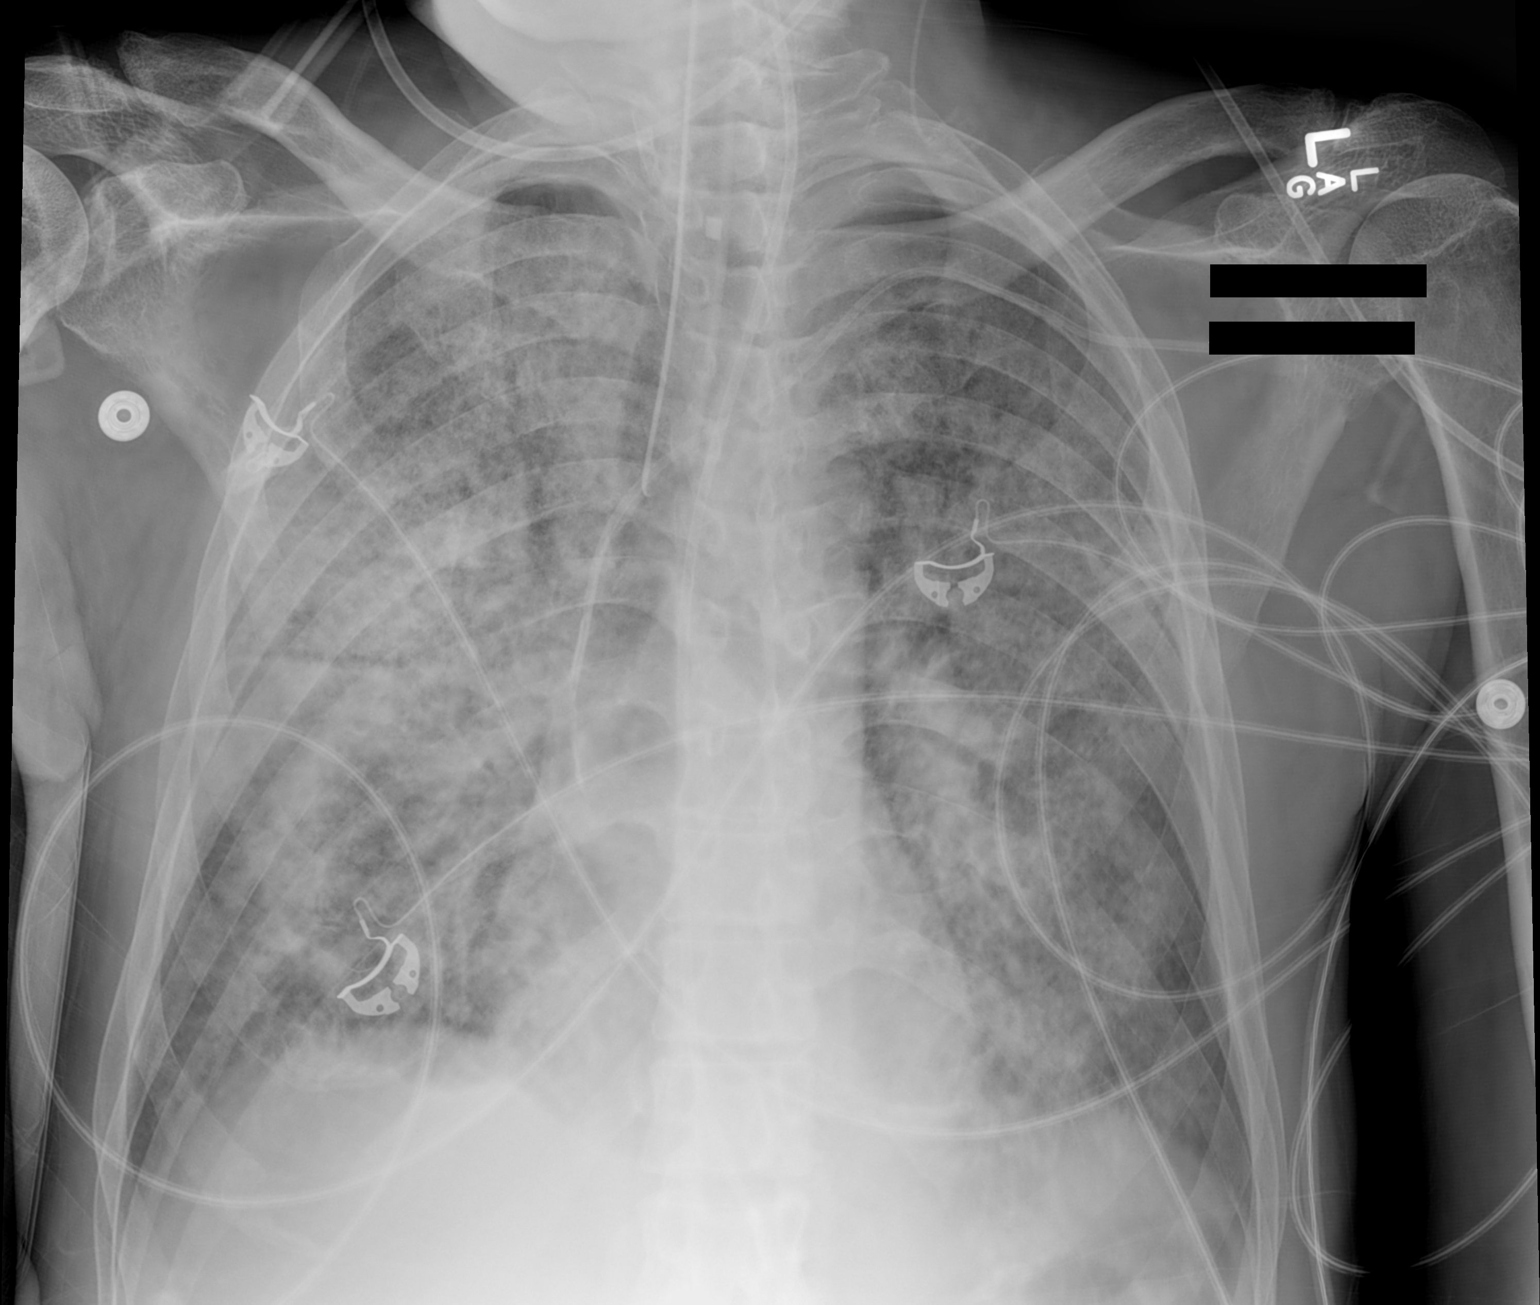

[1 of 1 positions shown; findings below may reference images not displayed]

FINDINGS: Support devices are stable.  Diffuse bilateral airspace
disease again noted, unchanged.  Heart is normal size.  No
pneumothorax or visible effusion.
IMPRESSION: Stable diffuse bilateral airspace disease.

## 2013-07-03 IMAGING — CR DG ABDOMEN 1V
1 series · 1 of 1 positions shown · non-contrast
Comparison: 04/08/2012

CLINICAL DATA: Abdominal pain and ileus.

ABDOMEN - 1 VIEW

[ap (kub)]
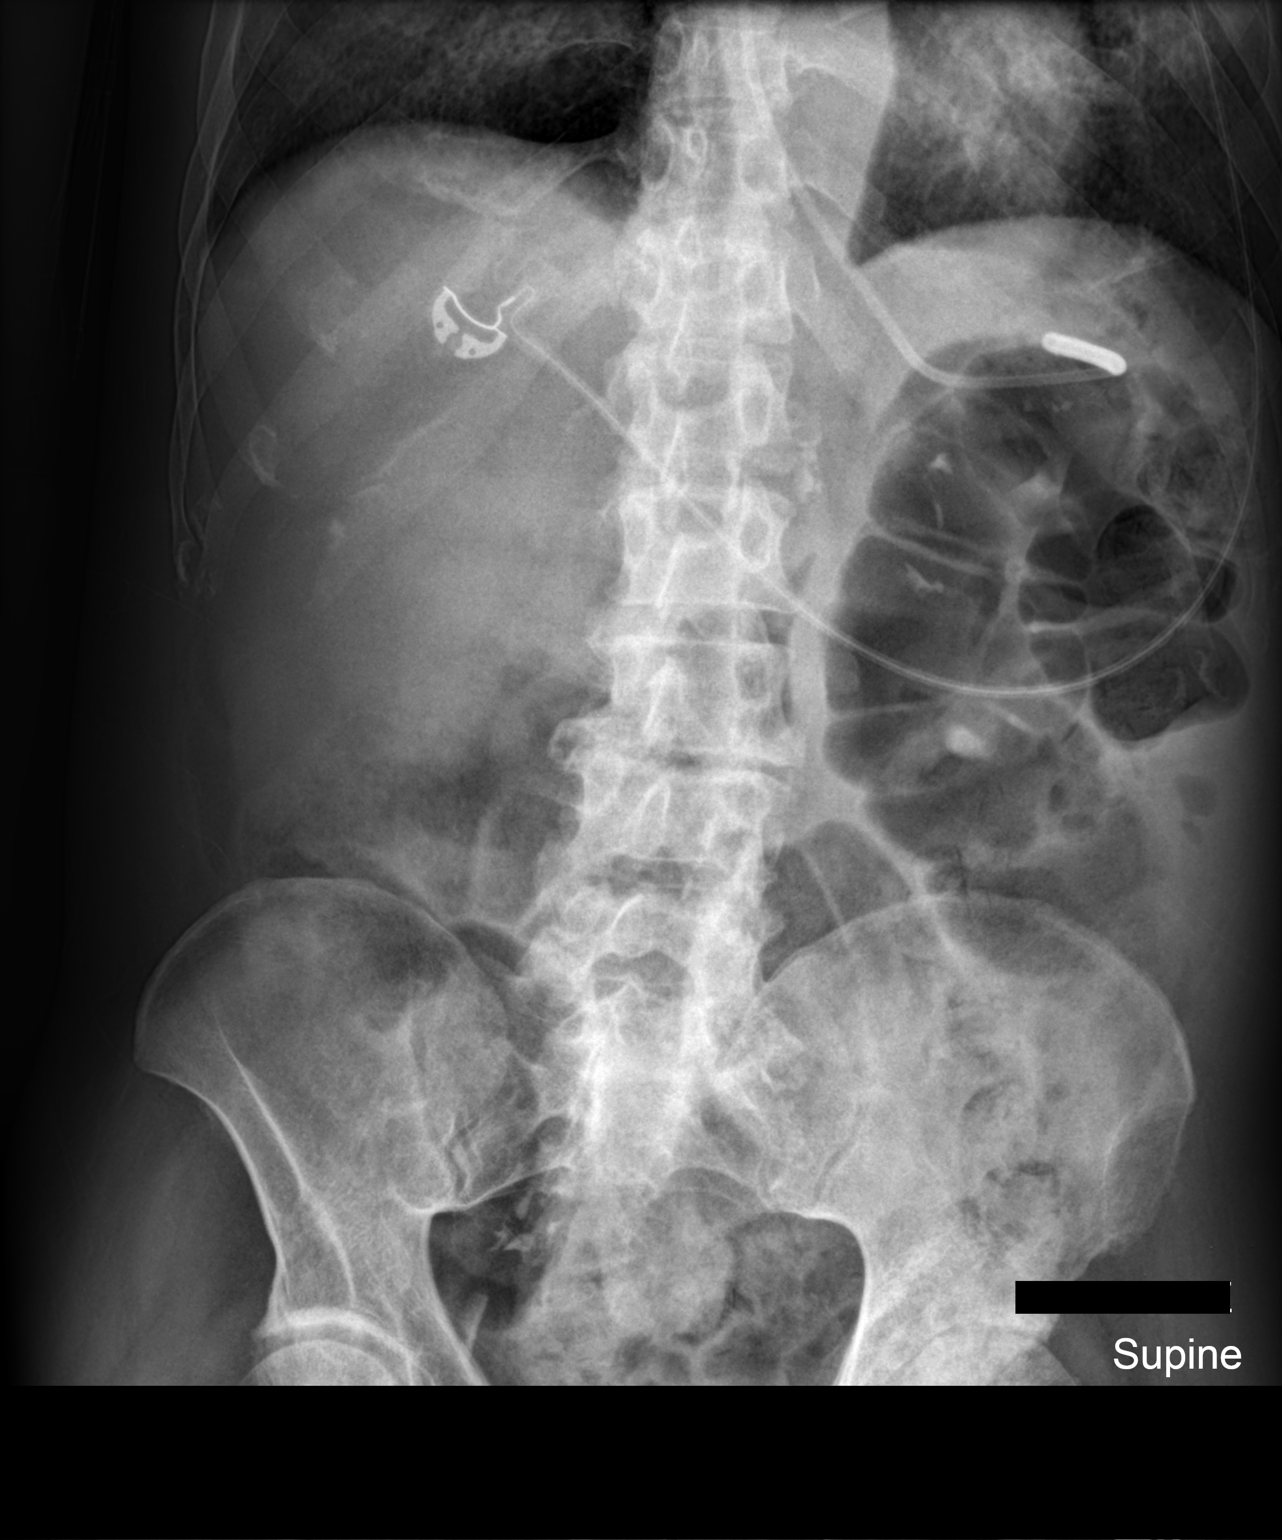

[1 of 1 positions shown; findings below may reference images not displayed]

FINDINGS: No gaseous bowel dilatation to suggest obstruction.
There is mild gaseous distention of the left colon.  Feeding tube
tip overlies the proximal stomach.  Splenic artery calcification
noted in the left upper quadrant.  Presumed temperature probe seen
over the inferior anatomic pelvis.  Degenerative changes are noted
in the left hip.
IMPRESSION: Mild diffuse gaseous colonic distention.

Feeding tube tip in the proximal stomach.

## 2013-07-04 IMAGING — CR DG CHEST 1V PORT
1 series · 1 of 1 positions shown · non-contrast
Comparison: Chest x-ray 04/12/2012.

CLINICAL DATA: Evaluate endotracheal tube.

PORTABLE CHEST - 1 VIEW

[AP]
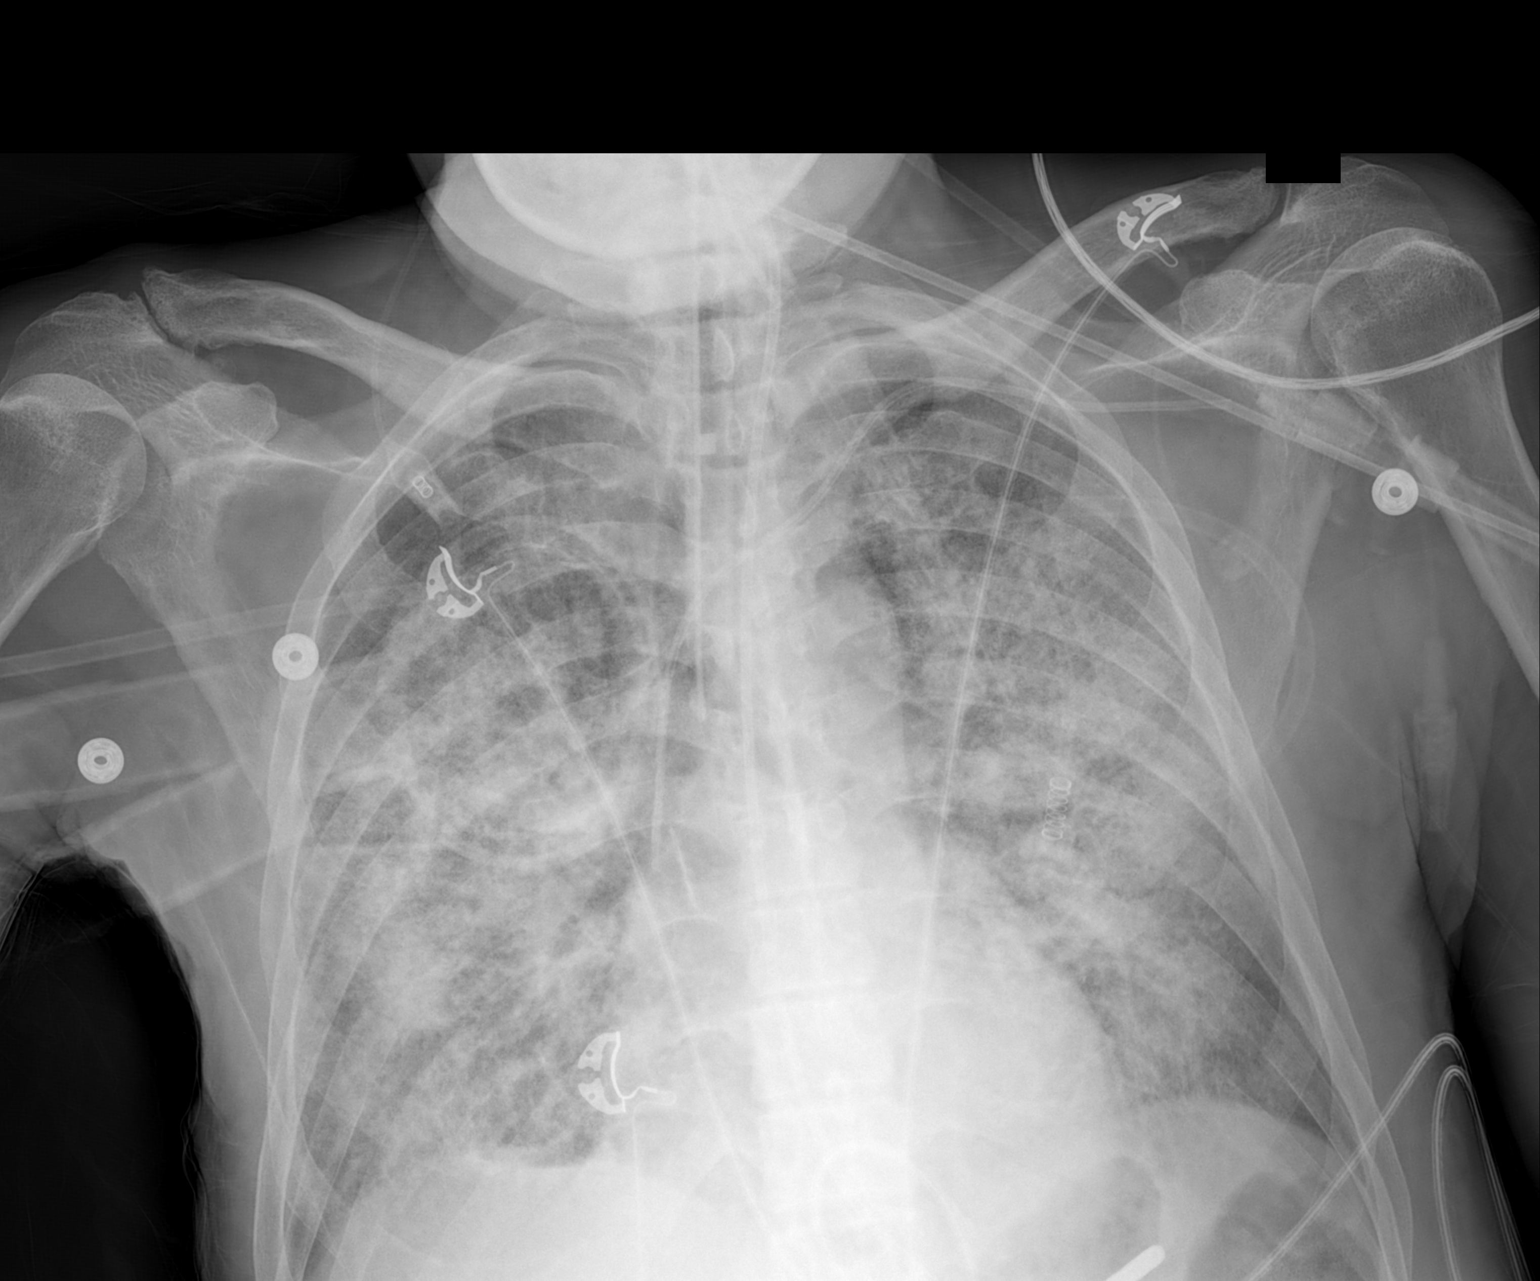

[1 of 1 positions shown; findings below may reference images not displayed]

FINDINGS: An endotracheal tube is in place with tip 0.8 cm above
the carina. There is a left-sided subclavian central venous
catheter with tip terminating in the the distal superior vena cava.
A feeding tube is seen extending into the abdomen, however, the tip
of the feeding tube extends below the lower margin of the image.
Lung volumes are slightly low.  There are extensive interstitial
and airspace opacities throughout all aspects of the lungs
bilaterally.  This obscures the pulmonary vasculature.  Heart size
is within normal limits.  Mediastinal contours are unremarkable.
IMPRESSION: 1.  Support apparatus, as above.  Please take note of the low lying
position of the endotracheal tube and consider withdrawal
approximately 5 cm for more optimal placement.
2.  Worsening multifocal interstitial and airspace disease
throughout the lungs bilaterally.  The appearance is nonspecific,
and while this could certainly represent edema, heart size is not
enlarged.  Clinical correlation is recommended.  This could
alternatively represent severe multilobar pneumonia, or diffuse
alveolar hemorrhage.

These results were called by telephone on 04/13/2012 at [DATE] a.m.
to Dr. Sagbo, who verbally acknowledged these results.

## 2013-07-06 IMAGING — CR DG CHEST 1V PORT
1 series · 1 of 1 positions shown · non-contrast
Comparison: 04/13/2012.

CLINICAL DATA: Airspace disease.  Endotracheal tube position.

PORTABLE CHEST - 1 VIEW

[AP]
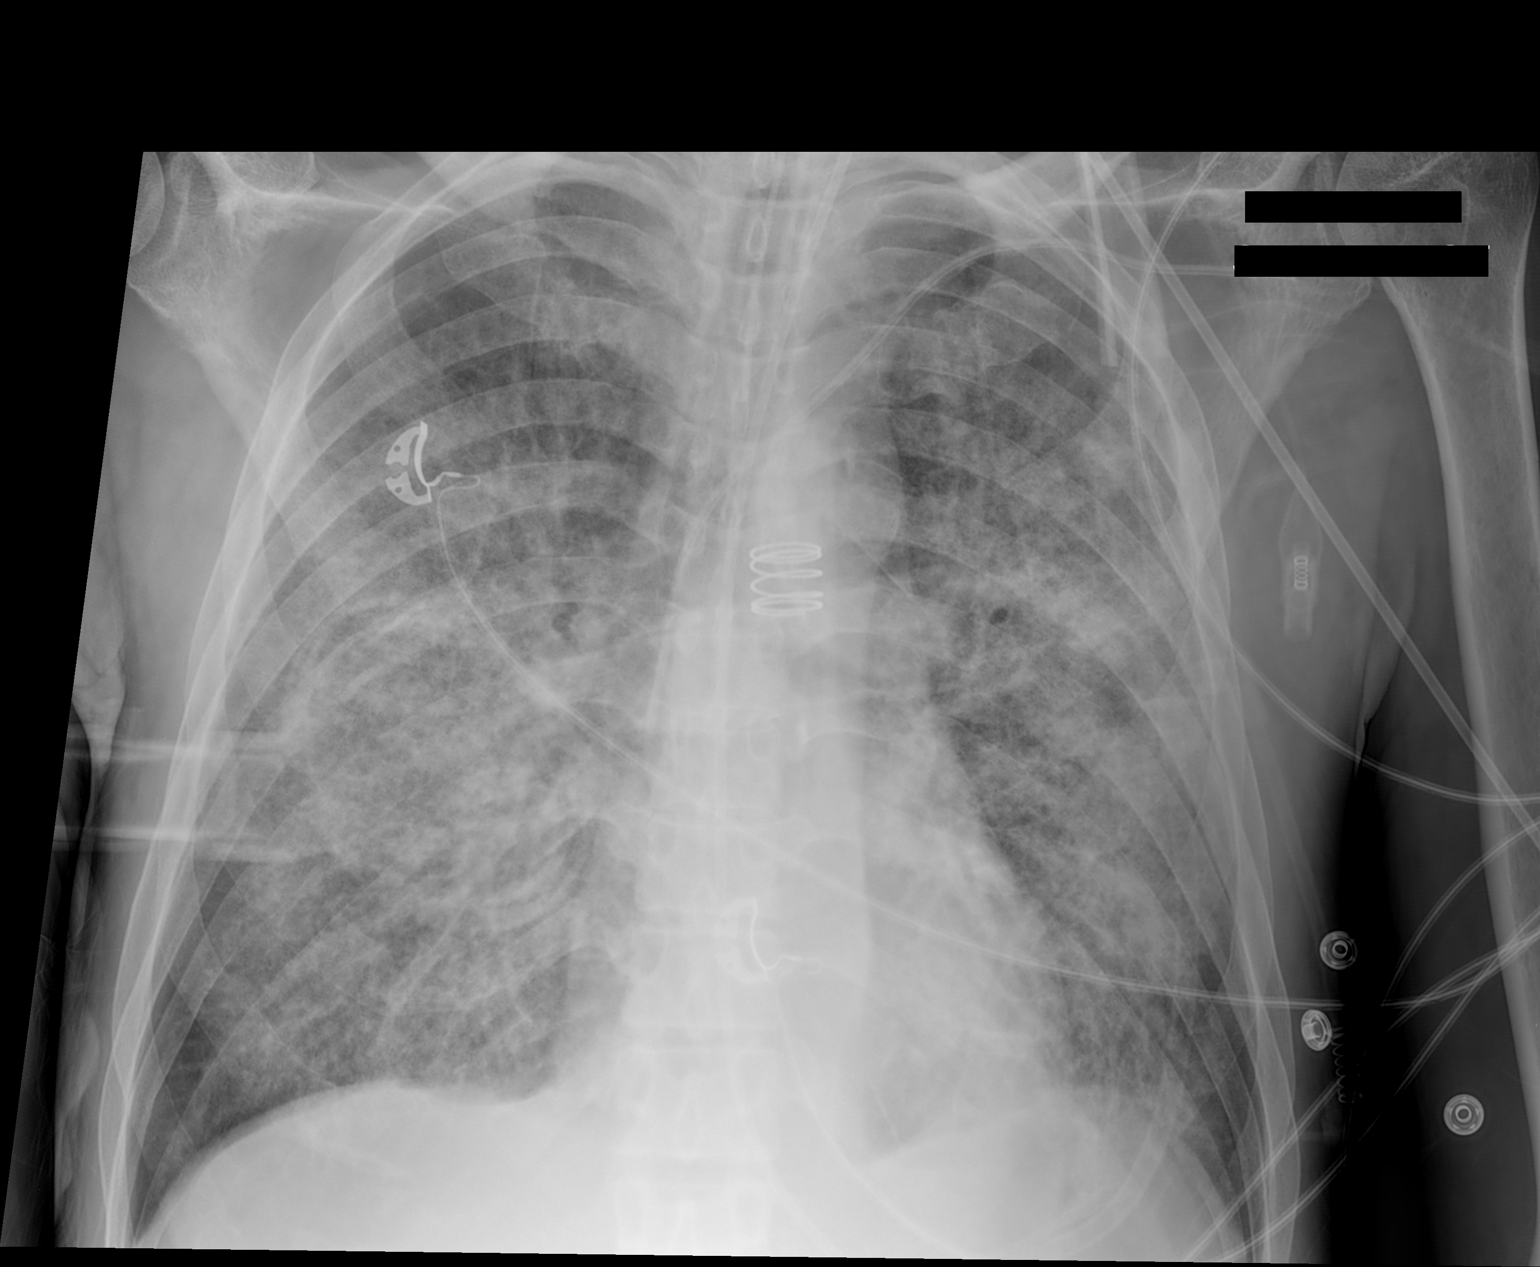

[1 of 1 positions shown; findings below may reference images not displayed]

FINDINGS: The endotracheal tube is 5 cm from the carina.  Feeding
tube and left subclavian central line remain present.  Allowing for
differences in technique, there is improved pulmonary aeration,
likely representing decreasing pulmonary edema based on the time
interval for improvement.  Cardiopericardial silhouette and
mediastinal contours are unchanged.
IMPRESSION: 1.  Endotracheal tube 5 cm from the carina.  Other support
apparatus stable.
2.  Improving pulmonary aeration with persistent interstitial and
airspace opacities.  Improved aeration is most compatible with
decreasing pulmonary edema.

## 2014-08-13 ENCOUNTER — Encounter (HOSPITAL_COMMUNITY): Payer: Self-pay | Admitting: Vascular Surgery

## 2014-09-17 ENCOUNTER — Encounter (HOSPITAL_COMMUNITY): Payer: Self-pay | Admitting: Orthopedic Surgery

## 2014-11-09 NOTE — Progress Notes (Signed)
This encounter was created in error - please disregard.

## 2014-11-09 NOTE — Progress Notes (Signed)
Date: 11/29/2012 MRN: [id number] Name: [name] Sex: male Age: 81 y.o. DOB:1929-05-04   [facility] #: [id number]  Facility/Room; [facility] Level Of Care:SNF Provider: Dr. Jeanmarie Hubert  Emergency Contacts: Contact Information   Name Relation Home Work [place] Spouse [phone]  [phone]   [facility] Daughter   [phone]   Azim, Gillingham   [phone]   Rockholt,James  [phone]     Simmons,Sheneka  [phone]     Hill,Matt Nephew   [phone]      Code Status: MOST Form:  Allergies: Allergies  Allergen Reactions  . Bactrim (Sulfamethoxazole W-Trimethoprim) Swelling    Lips, eyes, cheeks swollen  . Doxycycline Swelling    Lips, eyes, cheeks were all swollen     Chief Complaint  Patient presents with  . New Admit To SNF   Admitted to [facility] 11/06/12 to 11/08/12   HPI:81 y.o. male with hx of anemia, DM and diabetic complications, s/p recent left BKA, and is on coumadin probably for DVT prophylaxis, hx of gastritis and gastric polyp by EGD 04/2012 by Dr Barth Kirks, hypothyroidism, presents to the ER with hematemesis of blood and clots. He was borderline hypotensive with SBP 90's. He was found to have a Hb of 5.2 grams /DL, with WBC of 19K, BUN of 40, and INR of 3.5. After GI was consulted by the EDP, hospitalist was asked to admit him for upper GI bleed under the context of hypotension and supratherapeutic on coumadin.  Acute blood loss anemia/ *Hematemesis/vomiting blood : - 2 large IV and Volume resuscitated. - S/p 3 units transfusion 3.5.2014.     Past Medical History  Diagnosis Date  . Thyroid disease   . Anemia   . Hyperlipidemia   . Neuropathy, diabetic   . Diabetic retinopathy   . GERD (gastroesophageal reflux disease)   . Arthritis   . Urgency of urination   . Hypothyroidism   . Sacral wound     hx  of  . Cancer   . Diabetic retinopathy   . Peripheral vascular disease   . Diabetes mellitus     sees Dr. Nyoka Cowden [facility])  . CHF (congestive heart failure)   . Hypertension   . Prostate cancer    Hematemesis/vomiting blood Acute blood loss anemia Weakness Benign neoplasm of stomach Protein-calorie malnutrition, moderate   Past Surgical History  Procedure Laterality Date  . Cataract extraction, bilateral    . Colonoscopy w/ polypectomy    . Esophagogastroduodenoscopy  04/25/2012    Procedure: ESOPHAGOGASTRODUODENOSCOPY (EGD); Surgeon: Ladene Artist, MD,FACG; Location: [facility]; Service: Endoscopy; Laterality: N/A;  . Amputation  05/16/2012    Procedure: AMPUTATION BELOW KNEE; Surgeon: Newt Minion, MD; Location: [place]; Service: Orthopedics; Laterality: Right; Right Below Knee Amputation  . Tonsillectomy    . Foot amputation      right bka  . Eye surgery      bilateral  . Amputation  09/14/2012    Procedure: AMPUTATION BELOW KNEE; Surgeon: Newt Minion, MD; Location: [place]; Service: Orthopedics; Laterality: Left;  . Amputation Left 10/23/2012    Procedure: Left below knee amputation revision; Surgeon: Newt Minion, MD; Location: [place]; Service: Orthopedics; Laterality: Left; left below knee amputation revision  . Esophagogastroduodenoscopy N/A 11/07/2012    Procedure: ESOPHAGOGASTRODUODENOSCOPY (EGD); Surgeon: Inda Castle, MD; Location: [place]; Service: Endoscopy; Laterality: N/A;     Procedures: EGD 3.6.2014: Bleeding gastric polyp-status post band ligation   Possible single gastric and esophageal AVM (i.e. Studies not  automatically included, echos, thoracentesis, etc; not x-rays) Dg Abd 2 Views  11/06/2012 *RADIOLOGY REPORT* Clinical Data: Nausea and vomiting. ABDOMEN - 2 VIEW Comparison: Abdominal radiograph performed 05/05/2012 Findings: The  visualized bowel gas pattern is unremarkable. Scattered air and stool filled loops of colon are seen; no abnormal dilatation of small bowel loops is seen to suggest small bowel obstruction. No free intra-abdominal air is identified on the provided decubitus view. Mild degenerative change is noted at the mid lumbar spine; degenerative joint space narrowing is also noted at the left hip, with associated sclerotic change. The visualized lung bases are essentially clear. IMPRESSION: 1. Unremarkable bowel gas pattern; no free intra-abdominal air seen. Moderate to large amount of stool noted in the colon, without significant constipation. 2. 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 Original Report Authenticated By: Santa Lighter, M.D.    Consultants: PCP: Beacher May, MD  Dr. Deatra Ina GI.    Current Outpatient Prescriptions  Medication Sig Dispense Refill  . albuterol (PROVENTIL HFA;VENTOLIN HFA) 108 (90 BASE) MCG/ACT inhaler Inhale 2 puffs into the lungs every 6 (six) hours as needed for wheezing.    Marland Kitchen aspirin 81 MG chewable tablet Chew 1 tablet (81 mg total) by mouth daily.    Marland Kitchen docusate sodium (COLACE) 100 MG capsule Take 100 mg by mouth 2 (two) times daily.    . feeding supplement (PRO-STAT SUGAR FREE 64) LIQD Take 30 mLs by mouth 2 (two) times daily.    . ferrous sulfate 325 (65 FE) MG tablet Take 325 mg by mouth daily with breakfast.    . insulin glargine (LANTUS) 100 UNIT/ML injection Inject 10 Units into the skin at bedtime.    . insulin NPH-insulin regular (HUMULIN 70/30) (70-30) 100 UNIT/ML injection Inject 20 Units into the skin daily with breakfast.    . ipratropium (ATROVENT) 0.02 % nebulizer solution Take 500 mcg by nebulization every 2 (two) hours as needed (shortness of breath).    Marland Kitchen levothyroxine (SYNTHROID, LEVOTHROID) 88 MCG tablet Take 88 mcg by mouth daily.    Marland Kitchen linezolid (ZYVOX) 600 MG tablet Take  600 mg by mouth every 12 (twelve) hours. Starting 11/05/12 for 10 days    . metoprolol tartrate (LOPRESSOR) 25 MG tablet Take 12.5 mg by mouth 2 (two) times daily.    . mirtazapine (REMERON) 15 MG tablet Take 7.5 mg by mouth at bedtime.     Marland Kitchen omeprazole (PRILOSEC) 20 MG capsule Take 20 mg by mouth 2 (two) times daily.    Marland Kitchen oxyCODONE-acetaminophen (ROXICET) 5-325 MG per tablet Take 1 tablet by mouth every 4 (four) hours as needed for pain. 60 tablet 0  . pantoprazole (PROTONIX) 40 MG tablet Take 1 tablet (40 mg total) by mouth daily at 6 (six) AM. 30 tablet 0   No current facility-administered medications for this visit.     There is no immunization history on file for this patient.   Diet:  History  Substance Use Topics  . Smoking status: Former Smoker -- 0.25 packs/day    Types: Cigarettes    Quit date: 11/22/2006  . Smokeless tobacco: Never Used  . Alcohol Use: 0.0 oz/week    1-2 Cans of beer per week     Comment: drinks with meals    Family History  Problem Relation Age of Onset  . Hypertension       Vital signs: There were no vitals taken for this visit.  Review of Systems  Constitutional: Negative for fever, activity change, appetite change, fatigue and  unexpected weight change.  HENT: Negative for congestion, ear pain, hearing loss, rhinorrhea, sore throat, tinnitus, trouble swallowing and voice change.   Eyes:       Corrective lenses  Respiratory: Negative for cough, choking, chest tightness, shortness of breath and wheezing.   Cardiovascular: Negative for chest pain, palpitations and leg swelling.  Gastrointestinal: Negative for nausea, abdominal pain, diarrhea, constipation and abdominal distention.       Recent hematemesis and acute blood loss anemia. Underwent EGD by Dr. Deatra Ina. He had a polyp in the stomach that was banded.  Endocrine: Negative for cold intolerance, heat intolerance,  polydipsia, polyphagia and polyuria.  Genitourinary: Negative for dysuria, urgency, frequency and testicular pain.       Not incontinent  Musculoskeletal: Negative for myalgias, back pain, arthralgias, gait problem and neck pain.  Skin: Negative for color change, pallor and rash.  Allergic/Immunologic: Negative.   Neurological: Negative for dizziness, tremors, syncope, speech difficulty, weakness, numbness and headaches.  Hematological: Negative for adenopathy. Does not bruise/bleed easily.  Psychiatric/Behavioral: Negative for hallucinations, behavioral problems, confusion, sleep disturbance and decreased concentration. The patient is not nervous/anxious.    Physical Exam  Constitutional: He is oriented to person, place, and time. He appears well-developed and well-nourished. No distress.  HENT:  Right Ear: External ear normal.  Left Ear: External ear normal.  Nose: Nose normal.  Mouth/Throat: Oropharynx is clear and moist. No oropharyngeal exudate.  Eyes: Conjunctivae and EOM are normal. Pupils are equal, round, and reactive to light.  Neck: No JVD present. No tracheal deviation present. No thyromegaly present.  Cardiovascular: Normal rate, regular rhythm, normal heart sounds and intact distal pulses.  Exam reveals no gallop and no friction rub.   No murmur heard. Pulmonary/Chest: No respiratory distress. He has no wheezes. He has no rales. He exhibits no tenderness.  Abdominal: He exhibits no distension and no mass. There is no tenderness.  Musculoskeletal: Normal range of motion. He exhibits no edema or tenderness.  Lymphadenopathy:    He has no cervical adenopathy.  Neurological: He is alert and oriented to person, place, and time. He has normal reflexes. No cranial nerve deficit. Coordination normal.  Skin: No rash noted. No erythema. No pallor.  Psychiatric: He has a normal mood and affect. His behavior is normal. Judgment and thought content normal.     Screening Score   MMS    PHQ2    PHQ9     Fall Risk    BIMS    Annual summary: Hospitalizations:  Problem List as of 11/29/2012    ICD-9-CM   Uncontrolled diabetes mellitus   Weakness   Hypertension   Diabetes mellitus   Prostate cancer   Hypothyroidism   Hypokalemia   Anemia   Cellulitis of lower leg   Ulcer of leg due to secondary diabetes mellitus   Diabetes with ulcer of foot   Sacral decubitus ulcer   Non-ST elevation myocardial infarction (NSTEMI), initial episode of care   Pulmonary infiltrates   Dysphagia   Physical deconditioning   Systolic dysfunction   Hypernatremia   Benign neoplasm of stomach   Sepsis   Atherosclerosis of native arteries of the extremities with ulceration(440.23)   Last Assessment & Plan   08/21/2012 Office Visit Written 08/21/2012 1:07 PM by Angelia Mould, MD    This patient has extensive wounds involving the left heel, the lateral aspect of the left foot, and the left great toe. He has undergone an arteriogram which shows that there were no  options for revascularization which would significantly impacted distal circulation. Likewise there was no disease amenable to angioplasty. I have recommended that we proceed with a left below the knee amputation. I think he has a good chance of healing a below-the-knee amputation with a probable 10% chance of nonhealing. He will discuss this with his family and then called to schedule his surgery.    Hematemesis/vomiting blood   Supratherapeutic INR   Acute blood loss anemia   Protein-calorie malnutrition, moderate     Plan:
